# Patient Record
Sex: Female | Born: 1950 | Race: White | Hispanic: No | Marital: Married | Smoking: Current every day smoker
Health system: Southern US, Community
[De-identification: ages and names within clinical notes are randomized; demographics above are authoritative.]

## PROBLEM LIST (undated history)

## (undated) DIAGNOSIS — K219 Gastro-esophageal reflux disease without esophagitis: Secondary | ICD-10-CM

## (undated) DIAGNOSIS — I5042 Chronic combined systolic (congestive) and diastolic (congestive) heart failure: Secondary | ICD-10-CM

## (undated) DIAGNOSIS — Z8709 Personal history of other diseases of the respiratory system: Secondary | ICD-10-CM

## (undated) DIAGNOSIS — Z87442 Personal history of urinary calculi: Secondary | ICD-10-CM

## (undated) DIAGNOSIS — K449 Diaphragmatic hernia without obstruction or gangrene: Secondary | ICD-10-CM

## (undated) DIAGNOSIS — R0600 Dyspnea, unspecified: Secondary | ICD-10-CM

## (undated) DIAGNOSIS — I2089 Other forms of angina pectoris: Secondary | ICD-10-CM

## (undated) DIAGNOSIS — E039 Hypothyroidism, unspecified: Secondary | ICD-10-CM

## (undated) DIAGNOSIS — Z720 Tobacco use: Secondary | ICD-10-CM

## (undated) DIAGNOSIS — R21 Rash and other nonspecific skin eruption: Secondary | ICD-10-CM

## (undated) DIAGNOSIS — G252 Other specified forms of tremor: Secondary | ICD-10-CM

## (undated) DIAGNOSIS — J439 Emphysema, unspecified: Secondary | ICD-10-CM

## (undated) DIAGNOSIS — F32A Depression, unspecified: Secondary | ICD-10-CM

## (undated) DIAGNOSIS — R0609 Other forms of dyspnea: Secondary | ICD-10-CM

## (undated) DIAGNOSIS — Z7189 Other specified counseling: Secondary | ICD-10-CM

## (undated) DIAGNOSIS — I208 Other forms of angina pectoris: Secondary | ICD-10-CM

## (undated) DIAGNOSIS — C880 Waldenstrom macroglobulinemia: Secondary | ICD-10-CM

## (undated) DIAGNOSIS — I739 Peripheral vascular disease, unspecified: Secondary | ICD-10-CM

## (undated) DIAGNOSIS — I700 Atherosclerosis of aorta: Secondary | ICD-10-CM

## (undated) DIAGNOSIS — F329 Major depressive disorder, single episode, unspecified: Secondary | ICD-10-CM

## (undated) DIAGNOSIS — F419 Anxiety disorder, unspecified: Secondary | ICD-10-CM

## (undated) DIAGNOSIS — N179 Acute kidney failure, unspecified: Secondary | ICD-10-CM

## (undated) DIAGNOSIS — J90 Pleural effusion, not elsewhere classified: Secondary | ICD-10-CM

## (undated) DIAGNOSIS — J449 Chronic obstructive pulmonary disease, unspecified: Secondary | ICD-10-CM

## (undated) HISTORY — PX: DG THUMB RIGHT HAND (ARMC HX): HXRAD1825

## (undated) HISTORY — PX: BREAST SURGERY: SHX581

## (undated) HISTORY — PX: KIDNEY STONE SURGERY: SHX686

---
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
## 1998-05-02 ENCOUNTER — Other Ambulatory Visit: Admission: RE | Admit: 1998-05-02 | Discharge: 1998-05-02 | Payer: Self-pay | Admitting: Family Medicine

## 1999-03-18 ENCOUNTER — Encounter: Payer: Self-pay | Admitting: Emergency Medicine

## 1999-03-18 ENCOUNTER — Emergency Department (HOSPITAL_COMMUNITY): Admission: EM | Admit: 1999-03-18 | Discharge: 1999-03-18 | Payer: Self-pay | Admitting: Emergency Medicine

## 2002-06-14 ENCOUNTER — Ambulatory Visit (HOSPITAL_BASED_OUTPATIENT_CLINIC_OR_DEPARTMENT_OTHER): Admission: RE | Admit: 2002-06-14 | Discharge: 2002-06-14 | Payer: Self-pay | Admitting: Urology

## 2009-06-05 ENCOUNTER — Other Ambulatory Visit: Admission: RE | Admit: 2009-06-05 | Discharge: 2009-06-05 | Payer: Self-pay | Admitting: Radiology

## 2010-09-04 NOTE — Op Note (Signed)
NAMESHARLYNE, Tiffany Leon                             ACCOUNT NO.:  [id number]   MEDICAL RECORD NO.:  [id number]                   PATIENT TYPE:  AMB   LOCATION:  [facility]                                 FACILITY:  [facility]   PHYSICIAN:  Excell Seltzer. Annabell Howells, M.D.                 DATE OF BIRTH:  12-27-1950   DATE OF PROCEDURE:  06/14/2002  DATE OF DISCHARGE:                                 OPERATIVE REPORT   PROCEDURE:  Cystoscopy, left retrograde pyelogram, left ureteroscopic stone  extraction.   PREOPERATIVE DIAGNOSES:  Left UVJ stone.   POSTOPERATIVE DIAGNOSES:  Left UVJ stone.   SURGEON:  Excell Seltzer. Annabell Howells, M.D.   ANESTHESIA:  General.   COMPLICATIONS:  None.   INDICATIONS FOR PROCEDURE:  [name] is a 60 year old white female who  presented with left flank pain and was found to have a small left distal  ureteral stone. The stone has not passed and she has remained symptomatic  and has elected ureteroscopy.   DESCRIPTION OF PROCEDURE:  The patient was taken to the operating room and  after receiving p.o. antibiotics, a general anesthetic was induced. She was  placed in lithotomy position, her perineum and genitalia were prepped with  Betadine solution and she was draped in the usual sterile fashion.  Cystoscopy was performed using the 12 and 70 degree lenses through a 22  [place] scope. Examination revealed a normal urethra. The bladder wall was  smooth and pail without tumor, stones or inflammation. The ureteral orifices  were in the normal anatomic position. The left ureteral orifice was  cannulated with a 5 [place] open end catheter, contrast was instilled, there  was a filling defect in the distal ureter consistent with a stone. After the  present stone was confirmed, the open end catheter was removed and a  guidewire was the passed up the ureteral orifice to the kidney. The  cystoscope was removed and a 6 [place] short ureteroscope was passed over the  wire and negotiated up the ureter to  the skin. There was a bit of  inflammation and narrowing of the ureter at the level of the stone but this  was easily disrupted with the scope. The scope was then backed out and  replaced along side the wire. The stone was visualized once again and was  actually teased out with the scope, no basketing was required. Once the  stone was in the bladder, the cystoscope was reinserted and the stone was  evacuated. At this point, the bladder was drained and the guidewire was  removed and no stent was inserted as there was minimal trauma to the ureter.  At this point, the patient was taken down from lithotomy position, her  anesthetic was reversed and she was moved to the recovery room in stable  condition. There were no complications.  Excell Seltzer. Annabell Howells, M.D.    JJW/MEDQ  D:  06/14/2002  T:  06/14/2002  Job:  [id number]

## 2012-05-02 ENCOUNTER — Other Ambulatory Visit (HOSPITAL_COMMUNITY)
Admission: RE | Admit: 2012-05-02 | Discharge: 2012-05-02 | Disposition: A | Discharge status: Home / Self Care | Payer: BC Managed Care – PPO | Source: Ambulatory Visit | Attending: Family Medicine | Admitting: Family Medicine

## 2012-05-02 ENCOUNTER — Other Ambulatory Visit: Payer: Self-pay | Admitting: Family Medicine

## 2012-05-02 DIAGNOSIS — Z1151 Encounter for screening for human papillomavirus (HPV): Secondary | ICD-10-CM | POA: Insufficient documentation

## 2012-05-02 DIAGNOSIS — Z124 Encounter for screening for malignant neoplasm of cervix: Secondary | ICD-10-CM | POA: Insufficient documentation

## 2013-09-13 ENCOUNTER — Other Ambulatory Visit: Payer: Self-pay | Admitting: Internal Medicine

## 2013-09-13 DIAGNOSIS — R0602 Shortness of breath: Secondary | ICD-10-CM

## 2013-09-14 ENCOUNTER — Encounter (INDEPENDENT_AMBULATORY_CARE_PROVIDER_SITE_OTHER): Payer: Self-pay

## 2013-09-14 ENCOUNTER — Ambulatory Visit (INDEPENDENT_AMBULATORY_CARE_PROVIDER_SITE_OTHER): Payer: 59 | Admitting: Internal Medicine

## 2013-09-14 DIAGNOSIS — R0602 Shortness of breath: Secondary | ICD-10-CM

## 2013-09-14 NOTE — Progress Notes (Signed)
PFT done today. 

## 2013-09-16 LAB — PULMONARY FUNCTION TEST
DL/VA % pred: 74 %
DL/VA: 3.68 ml/min/mmHg/L
DLCO unc % pred: 72 %
DLCO unc: 18.5 ml/min/mmHg
FEF 25-75 Post: 1.44 L/sec
FEF 25-75 Pre: 1.26 L/sec
FEF2575-%Change-Post: 14 %
FEF2575-%Pred-Post: 65 %
FEF2575-%Pred-Pre: 57 %
FEV1-%Change-Post: 1 %
FEV1-%Pred-Post: 83 %
FEV1-%Pred-Pre: 82 %
FEV1-Post: 2.1 L
FEV1-Pre: 2.06 L
FEV1FVC-%Change-Post: -1 %
FEV1FVC-%Pred-Pre: 92 %
FEV6-%Change-Post: 1 %
FEV6-%Pred-Post: 91 %
FEV6-%Pred-Pre: 90 %
FEV6-Post: 2.88 L
FEV6-Pre: 2.84 L
FEV6FVC-%Change-Post: -1 %
FEV6FVC-%Pred-Post: 101 %
FEV6FVC-%Pred-Pre: 102 %
FVC-%Change-Post: 2 %
FVC-%Pred-Post: 90 %
FVC-%Pred-Pre: 87 %
FVC-Post: 2.96 L
FVC-Pre: 2.88 L
Post FEV1/FVC ratio: 71 %
Post FEV6/FVC ratio: 98 %
Pre FEV1/FVC ratio: 72 %
Pre FEV6/FVC Ratio: 99 %
RV % pred: 137 %
RV: 2.94 L
TLC % pred: 116 %
TLC: 6.05 L

## 2014-08-08 ENCOUNTER — Other Ambulatory Visit: Payer: Self-pay | Admitting: Family Medicine

## 2014-08-08 ENCOUNTER — Ambulatory Visit
Admission: RE | Admit: 2014-08-08 | Discharge: 2014-08-08 | Disposition: A | Discharge status: Home / Self Care | Payer: Self-pay | Source: Ambulatory Visit | Attending: Family Medicine | Admitting: Family Medicine

## 2014-08-08 DIAGNOSIS — W19XXXA Unspecified fall, initial encounter: Secondary | ICD-10-CM

## 2014-08-23 ENCOUNTER — Other Ambulatory Visit (HOSPITAL_COMMUNITY): Payer: Self-pay | Admitting: Gastroenterology

## 2017-11-29 ENCOUNTER — Other Ambulatory Visit: Payer: Self-pay | Admitting: Radiology

## 2017-12-06 ENCOUNTER — Ambulatory Visit: Payer: Self-pay | Admitting: General Surgery

## 2017-12-06 DIAGNOSIS — N6022 Fibroadenosis of left breast: Secondary | ICD-10-CM

## 2017-12-14 ENCOUNTER — Other Ambulatory Visit: Payer: Self-pay | Admitting: Gastroenterology

## 2017-12-14 DIAGNOSIS — R1310 Dysphagia, unspecified: Secondary | ICD-10-CM

## 2017-12-21 NOTE — Pre-Procedure Instructions (Signed)
Tiffany Leon  12/21/2017      [id number] [address] [place] [place] [address] [place] [address] Phone: [phone] Fax: [phone]    Your procedure is scheduled on September 11  Report to [place] at [place].M.  Call this number if you have problems the morning of surgery:  [phone]   Remember:  Do not eat or drink after midnight.     Take these medicines the morning of surgery with A SIP OF WATER  clonazePAM (KLONOPIN levothyroxine (SYNTHROID, LEVOTHROID)  7 days prior to surgery STOP taking any Aspirin(unless otherwise instructed by your surgeon), Aleve, Naproxen, Ibuprofen, Motrin, Advil, Goody's, BC's, all herbal medications, fish oil, and all vitamins    Do not wear jewelry, make-up or nail polish.  Do not wear lotions, powders, or perfumes, or deodorant.  Do not shave 48 hours prior to surgery.    Do not bring valuables to the hospital.  [facility] is not responsible for any belongings or valuables.  Contacts, dentures or bridgework may not be worn into surgery.  Leave your suitcase in the car.  After surgery it may be brought to your room.  For patients admitted to the hospital, discharge time will be determined by your treatment team.  Patients discharged the day of surgery will not be allowed to drive home.    Special instructions:   [facility]- Preparing For [facility]  Before surgery, you can play an important role. Because skin is not sterile, your skin needs to be as free of germs as possible. You can reduce the number of germs on your skin by washing with CHG (chlorahexidine gluconate) Soap before surgery.  CHG is an antiseptic cleaner which kills germs and bonds with the skin to continue killing germs even after washing.    Oral Hygiene is also important to reduce your risk of infection.  Remember - BRUSH YOUR TEETH THE MORNING OF SURGERY WITH YOUR REGULAR TOOTHPASTE  Please do not use if  you have an allergy to CHG or antibacterial soaps. If your skin becomes reddened/irritated stop using the CHG.  Do not shave (including legs and underarms) for at least 48 hours prior to first CHG shower. It is OK to shave your face.  Please follow these instructions carefully.   1. Shower the NIGHT BEFORE SURGERY and the MORNING OF SURGERY with CHG.   2. If you chose to wash your hair, wash your hair first as usual with your normal shampoo.  3. After you shampoo, rinse your hair and body thoroughly to remove the shampoo.  4. Use CHG as you would any other liquid soap. You can apply CHG directly to the skin and wash gently with a scrungie or a clean washcloth.   5. Apply the CHG Soap to your body ONLY FROM THE NECK DOWN.  Do not use on open wounds or open sores. Avoid contact with your eyes, ears, mouth and genitals (private parts). Wash Face and genitals (private parts)  with your normal soap.  6. Wash thoroughly, paying special attention to the area where your surgery will be performed.  7. Thoroughly rinse your body with warm water from the neck down.  8. DO NOT shower/wash with your normal soap after using and rinsing off the CHG Soap.  9. Pat yourself dry with a CLEAN TOWEL.  10. Wear CLEAN PAJAMAS to bed the night before surgery, wear comfortable clothes the morning of surgery  11. Place CLEAN SHEETS on your bed the night of your first shower and DO NOT SLEEP WITH PETS.    Day of Surgery:  Do not apply any deodorants/lotions.  Please wear clean clothes to the hospital/surgery center.   Remember to brush your teeth WITH YOUR REGULAR TOOTHPASTE.    Please read over the following fact sheets that you were given.

## 2017-12-22 ENCOUNTER — Encounter (HOSPITAL_COMMUNITY): Payer: Self-pay | Admitting: *Deleted

## 2017-12-22 ENCOUNTER — Other Ambulatory Visit: Payer: Self-pay

## 2017-12-22 ENCOUNTER — Encounter (HOSPITAL_COMMUNITY)
Admission: RE | Admit: 2017-12-22 | Discharge: 2017-12-22 | Disposition: A | Discharge status: Home / Self Care | Payer: Medicare Other | Source: Ambulatory Visit | Attending: General Surgery | Admitting: General Surgery

## 2017-12-22 DIAGNOSIS — Z01812 Encounter for preprocedural laboratory examination: Secondary | ICD-10-CM | POA: Insufficient documentation

## 2017-12-22 HISTORY — DX: Personal history of urinary calculi: Z87.442

## 2017-12-22 HISTORY — DX: Emphysema, unspecified: J43.9

## 2017-12-22 HISTORY — DX: Gastro-esophageal reflux disease without esophagitis: K21.9

## 2017-12-22 HISTORY — DX: Anxiety disorder, unspecified: F41.9

## 2017-12-22 HISTORY — DX: Hypothyroidism, unspecified: E03.9

## 2017-12-22 HISTORY — DX: Personal history of other diseases of the respiratory system: Z87.09

## 2017-12-22 HISTORY — DX: Major depressive disorder, single episode, unspecified: F32.9

## 2017-12-22 HISTORY — DX: Depression, unspecified: F32.A

## 2017-12-22 LAB — BASIC METABOLIC PANEL
Anion gap: 9 (ref 5–15)
BUN: 14 mg/dL (ref 8–23)
CHLORIDE: 108 mmol/L (ref 98–111)
CO2: 25 mmol/L (ref 22–32)
CREATININE: 0.72 mg/dL (ref 0.44–1.00)
Calcium: 9.3 mg/dL (ref 8.9–10.3)
GFR calc non Af Amer: >60 mL/min (ref >60)
Glucose, Bld: 109 mg/dL — ABNORMAL HIGH (ref 70–99)
Potassium: 4.1 mmol/L (ref 3.5–5.1)
SODIUM: 142 mmol/L (ref 135–145)

## 2017-12-22 LAB — CBC
HEMATOCRIT: 44.2 % (ref 36.0–46.0)
HEMOGLOBIN: 14.6 g/dL (ref 12.0–15.0)
MCH: 34 pg (ref 26.0–34.0)
MCHC: 33 g/dL (ref 30.0–36.0)
MCV: 102.8 fL — ABNORMAL HIGH (ref 78.0–100.0)
Platelets: 158 10*3/uL (ref 150–400)
RBC: 4.3 MIL/uL (ref 3.87–5.11)
RDW: 13.2 % (ref 11.5–15.5)
WBC: 4.1 10*3/uL (ref 4.0–10.5)

## 2017-12-27 NOTE — Anesthesia Preprocedure Evaluation (Addendum)
Anesthesia Evaluation  Patient identified by MRN, date of birth, ID band Patient awake    Reviewed: Allergy & Precautions, NPO status , Patient's Chart, lab work & pertinent test results  Airway Mallampati: I       Dental no notable dental hx. (+) Teeth Intact   Pulmonary Current Smoker,    Pulmonary exam normal breath sounds clear to auscultation       Cardiovascular negative cardio ROS Normal cardiovascular exam Rhythm:Regular Rate:Normal     Neuro/Psych PSYCHIATRIC DISORDERS Anxiety Depression    GI/Hepatic GERD  ,  Endo/Other  Hypothyroidism   Renal/GU      Musculoskeletal   Abdominal Normal abdominal exam  (+)   Peds  Hematology   Anesthesia Other Findings   Reproductive/Obstetrics                            Anesthesia Physical Anesthesia Plan  ASA: II  Anesthesia Plan: General   Post-op Pain Management:    Induction: Intravenous  PONV Risk Score and Plan: 3 and Ondansetron and Dexamethasone  Airway Management Planned: LMA  Additional Equipment:   Intra-op Plan:   Post-operative Plan: Extubation in OR  Informed Consent: I have reviewed the patients History and Physical, chart, labs and discussed the procedure including the risks, benefits and alternatives for the proposed anesthesia with the patient or authorized representative who has indicated his/her understanding and acceptance.   Dental advisory given  Plan Discussed with: CRNA and Surgeon  Anesthesia Plan Comments:        Anesthesia Quick Evaluation

## 2017-12-28 ENCOUNTER — Ambulatory Visit (HOSPITAL_COMMUNITY)
Admission: RE | Admit: 2017-12-28 | Discharge: 2017-12-28 | Disposition: A | Discharge status: Home / Self Care | Payer: Medicare Other | Source: Ambulatory Visit | Attending: General Surgery | Admitting: General Surgery

## 2017-12-28 ENCOUNTER — Ambulatory Visit (HOSPITAL_COMMUNITY): Payer: Medicare Other | Admitting: Anesthesiology

## 2017-12-28 ENCOUNTER — Encounter (HOSPITAL_COMMUNITY): Payer: Self-pay | Admitting: Anesthesiology

## 2017-12-28 ENCOUNTER — Encounter (HOSPITAL_COMMUNITY)
Admission: RE | Disposition: A | Discharge status: Home / Self Care | Payer: Self-pay | Source: Ambulatory Visit | Attending: General Surgery

## 2017-12-28 DIAGNOSIS — F17200 Nicotine dependence, unspecified, uncomplicated: Secondary | ICD-10-CM | POA: Insufficient documentation

## 2017-12-28 DIAGNOSIS — J439 Emphysema, unspecified: Secondary | ICD-10-CM | POA: Insufficient documentation

## 2017-12-28 DIAGNOSIS — F419 Anxiety disorder, unspecified: Secondary | ICD-10-CM | POA: Insufficient documentation

## 2017-12-28 DIAGNOSIS — N6022 Fibroadenosis of left breast: Secondary | ICD-10-CM | POA: Insufficient documentation

## 2017-12-28 DIAGNOSIS — E039 Hypothyroidism, unspecified: Secondary | ICD-10-CM | POA: Insufficient documentation

## 2017-12-28 DIAGNOSIS — Z885 Allergy status to narcotic agent status: Secondary | ICD-10-CM | POA: Insufficient documentation

## 2017-12-28 DIAGNOSIS — Z79899 Other long term (current) drug therapy: Secondary | ICD-10-CM | POA: Diagnosis not present

## 2017-12-28 DIAGNOSIS — K219 Gastro-esophageal reflux disease without esophagitis: Secondary | ICD-10-CM | POA: Diagnosis not present

## 2017-12-28 DIAGNOSIS — N6012 Diffuse cystic mastopathy of left breast: Secondary | ICD-10-CM | POA: Insufficient documentation

## 2017-12-28 DIAGNOSIS — F329 Major depressive disorder, single episode, unspecified: Secondary | ICD-10-CM | POA: Insufficient documentation

## 2017-12-28 DIAGNOSIS — Z886 Allergy status to analgesic agent status: Secondary | ICD-10-CM | POA: Insufficient documentation

## 2017-12-28 DIAGNOSIS — Z803 Family history of malignant neoplasm of breast: Secondary | ICD-10-CM | POA: Diagnosis not present

## 2017-12-28 HISTORY — PX: BREAST LUMPECTOMY WITH RADIOACTIVE SEED LOCALIZATION: SHX6424

## 2017-12-28 SURGERY — BREAST LUMPECTOMY WITH RADIOACTIVE SEED LOCALIZATION
Anesthesia: General | Site: Breast | Laterality: Left

## 2017-12-28 MED ORDER — FENTANYL CITRATE (PF) 250 MCG/5ML IJ SOLN
INTRAMUSCULAR | Status: AC
  Filled 2017-12-28: qty 5

## 2017-12-28 MED ORDER — SUCCINYLCHOLINE CHLORIDE 200 MG/10ML IV SOSY
PREFILLED_SYRINGE | INTRAVENOUS | Status: AC
  Filled 2017-12-28: qty 10

## 2017-12-28 MED ORDER — LIDOCAINE 2% (20 MG/ML) 5 ML SYRINGE
INTRAMUSCULAR | Status: DC | PRN
  Administered 2017-12-28: 100 mg via INTRAVENOUS

## 2017-12-28 MED ORDER — BUPIVACAINE-EPINEPHRINE 0.25% -1:200000 IJ SOLN
INTRAMUSCULAR | Status: DC | PRN
  Administered 2017-12-28: 10 mL

## 2017-12-28 MED ORDER — ACETAMINOPHEN 500 MG PO TABS
ORAL_TABLET | ORAL | Status: AC
  Administered 2017-12-28: 1000 mg via ORAL
  Filled 2017-12-28: qty 2

## 2017-12-28 MED ORDER — CELECOXIB 200 MG PO CAPS
ORAL_CAPSULE | ORAL | Status: AC
  Administered 2017-12-28: 200 mg via ORAL
  Filled 2017-12-28: qty 1

## 2017-12-28 MED ORDER — ACETAMINOPHEN 325 MG PO TABS: 325.0000 mg | ORAL_TABLET | ORAL | Status: DC | PRN

## 2017-12-28 MED ORDER — CELECOXIB 200 MG PO CAPS
200.0000 mg | ORAL_CAPSULE | ORAL | Status: AC
  Administered 2017-12-28: 200 mg via ORAL

## 2017-12-28 MED ORDER — CHLORHEXIDINE GLUCONATE CLOTH 2 % EX PADS: 6.0000 | MEDICATED_PAD | Freq: Once | CUTANEOUS | Status: DC

## 2017-12-28 MED ORDER — OXYCODONE HCL 5 MG PO TABS: 5.0000 mg | ORAL_TABLET | Freq: Once | ORAL | Status: DC | PRN

## 2017-12-28 MED ORDER — FENTANYL CITRATE (PF) 100 MCG/2ML IJ SOLN: 25.0000 ug | INTRAMUSCULAR | Status: DC | PRN

## 2017-12-28 MED ORDER — ONDANSETRON HCL 4 MG/2ML IJ SOLN
INTRAMUSCULAR | Status: DC | PRN
  Administered 2017-12-28: 4 mg via INTRAVENOUS

## 2017-12-28 MED ORDER — MEPERIDINE HCL 50 MG/ML IJ SOLN: 6.2500 mg | INTRAMUSCULAR | Status: DC | PRN

## 2017-12-28 MED ORDER — EPHEDRINE SULFATE-NACL 50-0.9 MG/10ML-% IV SOSY
PREFILLED_SYRINGE | INTRAVENOUS | Status: DC | PRN
  Administered 2017-12-28: 10 mg via INTRAVENOUS

## 2017-12-28 MED ORDER — LIDOCAINE 2% (20 MG/ML) 5 ML SYRINGE
INTRAMUSCULAR | Status: AC
  Filled 2017-12-28: qty 5

## 2017-12-28 MED ORDER — DEXAMETHASONE SODIUM PHOSPHATE 10 MG/ML IJ SOLN
INTRAMUSCULAR | Status: DC | PRN
  Administered 2017-12-28: 10 mg via INTRAVENOUS

## 2017-12-28 MED ORDER — TRAMADOL HCL 50 MG PO TABS: 50.0000 mg | ORAL_TABLET | Freq: Four times a day (QID) | ORAL | 1 refills | Status: DC | PRN

## 2017-12-28 MED ORDER — ACETAMINOPHEN 500 MG PO TABS
1000.0000 mg | ORAL_TABLET | ORAL | Status: AC
  Administered 2017-12-28: 1000 mg via ORAL

## 2017-12-28 MED ORDER — ONDANSETRON HCL 4 MG/2ML IJ SOLN
INTRAMUSCULAR | Status: AC
  Filled 2017-12-28: qty 2

## 2017-12-28 MED ORDER — FENTANYL CITRATE (PF) 250 MCG/5ML IJ SOLN
INTRAMUSCULAR | Status: DC | PRN
  Administered 2017-12-28: 25 ug via INTRAVENOUS
  Administered 2017-12-28: 50 ug via INTRAVENOUS

## 2017-12-28 MED ORDER — GABAPENTIN 300 MG PO CAPS
300.0000 mg | ORAL_CAPSULE | ORAL | Status: AC
  Administered 2017-12-28: 300 mg via ORAL

## 2017-12-28 MED ORDER — CEFAZOLIN SODIUM-DEXTROSE 2-4 GM/100ML-% IV SOLN
2.0000 g | INTRAVENOUS | Status: AC
  Administered 2017-12-28: 2 g via INTRAVENOUS

## 2017-12-28 MED ORDER — 0.9 % SODIUM CHLORIDE (POUR BTL) OPTIME
TOPICAL | Status: DC | PRN
  Administered 2017-12-28: 1000 mL

## 2017-12-28 MED ORDER — GABAPENTIN 300 MG PO CAPS
ORAL_CAPSULE | ORAL | Status: AC
  Administered 2017-12-28: 300 mg via ORAL
  Filled 2017-12-28: qty 1

## 2017-12-28 MED ORDER — PHENYLEPHRINE 40 MCG/ML (10ML) SYRINGE FOR IV PUSH (FOR BLOOD PRESSURE SUPPORT)
PREFILLED_SYRINGE | INTRAVENOUS | Status: AC
  Filled 2017-12-28: qty 10

## 2017-12-28 MED ORDER — ROCURONIUM BROMIDE 50 MG/5ML IV SOSY
PREFILLED_SYRINGE | INTRAVENOUS | Status: AC
  Filled 2017-12-28: qty 5

## 2017-12-28 MED ORDER — KETOROLAC TROMETHAMINE 30 MG/ML IJ SOLN: 30.0000 mg | Freq: Once | INTRAMUSCULAR | Status: DC | PRN

## 2017-12-28 MED ORDER — DEXAMETHASONE SODIUM PHOSPHATE 10 MG/ML IJ SOLN
INTRAMUSCULAR | Status: AC
  Filled 2017-12-28: qty 1

## 2017-12-28 MED ORDER — MIDAZOLAM HCL 2 MG/2ML IJ SOLN
INTRAMUSCULAR | Status: DC | PRN
  Administered 2017-12-28: 2 mg via INTRAVENOUS

## 2017-12-28 MED ORDER — BUPIVACAINE-EPINEPHRINE (PF) 0.25% -1:200000 IJ SOLN
INTRAMUSCULAR | Status: AC
  Filled 2017-12-28: qty 30

## 2017-12-28 MED ORDER — ACETAMINOPHEN 160 MG/5ML PO SOLN: 325.0000 mg | ORAL | Status: DC | PRN

## 2017-12-28 MED ORDER — EPHEDRINE 5 MG/ML INJ
INTRAVENOUS | Status: AC
  Filled 2017-12-28: qty 10

## 2017-12-28 MED ORDER — MIDAZOLAM HCL 2 MG/2ML IJ SOLN
INTRAMUSCULAR | Status: AC
  Filled 2017-12-28: qty 2

## 2017-12-28 MED ORDER — LACTATED RINGERS IV SOLN
INTRAVENOUS | Status: DC | PRN
  Administered 2017-12-28: 08:00:00 via INTRAVENOUS

## 2017-12-28 MED ORDER — OXYCODONE HCL 5 MG/5ML PO SOLN: 5.0000 mg | Freq: Once | ORAL | Status: DC | PRN

## 2017-12-28 MED ORDER — PHENYLEPHRINE 40 MCG/ML (10ML) SYRINGE FOR IV PUSH (FOR BLOOD PRESSURE SUPPORT)
PREFILLED_SYRINGE | INTRAVENOUS | Status: DC | PRN
  Administered 2017-12-28: 80 ug via INTRAVENOUS

## 2017-12-28 MED ORDER — PROPOFOL 10 MG/ML IV BOLUS
INTRAVENOUS | Status: AC
  Filled 2017-12-28: qty 20

## 2017-12-28 MED ORDER — PROPOFOL 10 MG/ML IV BOLUS
INTRAVENOUS | Status: DC | PRN
  Administered 2017-12-28: 50 mg via INTRAVENOUS
  Administered 2017-12-28: 100 mg via INTRAVENOUS

## 2017-12-28 MED ORDER — PROMETHAZINE HCL 25 MG/ML IJ SOLN: 6.2500 mg | INTRAMUSCULAR | Status: DC | PRN

## 2017-12-28 MED ORDER — CEFAZOLIN SODIUM-DEXTROSE 2-4 GM/100ML-% IV SOLN
INTRAVENOUS | Status: AC
  Filled 2017-12-28: qty 100

## 2017-12-28 SURGICAL SUPPLY — 44 items
ADH SKN CLS APL DERMABOND .7 (GAUZE/BANDAGES/DRESSINGS) ×1
APPLIER CLIP 9.375 MED OPEN (MISCELLANEOUS)
APR CLP MED 9.3 20 MLT OPN (MISCELLANEOUS)
BINDER BREAST LRG (GAUZE/BANDAGES/DRESSINGS) ×1 IMPLANT
BINDER BREAST XLRG (GAUZE/BANDAGES/DRESSINGS) IMPLANT
BLADE SURG 15 STRL LF DISP TIS (BLADE) ×1 IMPLANT
BLADE SURG 15 STRL SS (BLADE) ×2
CANISTER SUCT 3000ML PPV (MISCELLANEOUS) ×2 IMPLANT
CHLORAPREP W/TINT 26ML (MISCELLANEOUS) ×2 IMPLANT
CLIP APPLIE 9.375 MED OPEN (MISCELLANEOUS) IMPLANT
COVER PROBE W GEL 5X96 (DRAPES) ×2 IMPLANT
COVER SURGICAL LIGHT HANDLE (MISCELLANEOUS) ×2 IMPLANT
DERMABOND ADVANCED (GAUZE/BANDAGES/DRESSINGS) ×1
DERMABOND ADVANCED .7 DNX12 (GAUZE/BANDAGES/DRESSINGS) ×1 IMPLANT
DEVICE DUBIN SPECIMEN MAMMOGRA (MISCELLANEOUS) ×2 IMPLANT
DRAPE CHEST BREAST 15X10 FENES (DRAPES) ×2 IMPLANT
DRAPE UTILITY XL STRL (DRAPES) ×2 IMPLANT
ELECT COATED BLADE 2.86 ST (ELECTRODE) ×2 IMPLANT
ELECT REM PT RETURN 9FT ADLT (ELECTROSURGICAL) ×2
ELECTRODE REM PT RTRN 9FT ADLT (ELECTROSURGICAL) ×1 IMPLANT
GLOVE BIO SURGEON STRL SZ7.5 (GLOVE) ×4 IMPLANT
GLOVE BIOGEL PI IND STRL 8 (GLOVE) IMPLANT
GLOVE BIOGEL PI INDICATOR 8 (GLOVE) ×1
GLOVE SURG SS PI 6.5 STRL IVOR (GLOVE) ×1 IMPLANT
GOWN STRL REUS W/ TWL LRG LVL3 (GOWN DISPOSABLE) ×2 IMPLANT
GOWN STRL REUS W/TWL LRG LVL3 (GOWN DISPOSABLE) ×4
KIT BASIN OR (CUSTOM PROCEDURE TRAY) ×2 IMPLANT
KIT MARKER MARGIN INK (KITS) ×2 IMPLANT
LIGHT WAVEGUIDE WIDE FLAT (MISCELLANEOUS) IMPLANT
NDL HYPO 25GX1X1/2 BEV (NEEDLE) ×1 IMPLANT
NEEDLE HYPO 25GX1X1/2 BEV (NEEDLE) ×2 IMPLANT
NS IRRIG 1000ML POUR BTL (IV SOLUTION) ×2 IMPLANT
PACK SURGICAL SETUP 50X90 (CUSTOM PROCEDURE TRAY) ×2 IMPLANT
PENCIL BUTTON HOLSTER BLD 10FT (ELECTRODE) ×2 IMPLANT
SPONGE LAP 18X18 X RAY DECT (DISPOSABLE) ×2 IMPLANT
SUT MNCRL AB 4-0 PS2 18 (SUTURE) ×2 IMPLANT
SUT SILK 2 0 SH (SUTURE) IMPLANT
SUT VIC AB 3-0 SH 18 (SUTURE) ×2 IMPLANT
SYR BULB 3OZ (MISCELLANEOUS) ×2 IMPLANT
SYR CONTROL 10ML LL (SYRINGE) ×2 IMPLANT
TOWEL OR 17X24 6PK STRL BLUE (TOWEL DISPOSABLE) ×1 IMPLANT
TOWEL OR 17X26 10 PK STRL BLUE (TOWEL DISPOSABLE) ×2 IMPLANT
TUBE CONNECTING 12X1/4 (SUCTIONS) ×2 IMPLANT
YANKAUER SUCT BULB TIP NO VENT (SUCTIONS) ×2 IMPLANT

## 2017-12-28 NOTE — Interval H&P Note (Signed)
History and Physical Interval Note:  12/28/2017 7:31 AM  Tiffany Leon  has presented today for surgery, with the diagnosis of LEFT BREAST COMPLEX SCLEROSING LESION  The various methods of treatment have been discussed with the patient and family. After consideration of risks, benefits and other options for treatment, the patient has consented to  Procedure(s): BREAST LUMPECTOMY WITH RADIOACTIVE SEED LOCALIZATION (Left) as a surgical intervention .  The patient's history has been reviewed, patient examined, no change in status, stable for surgery.  I have reviewed the patient's chart and labs.  Questions were answered to the patient's satisfaction.     TOTH III,Sindy Mccune S

## 2017-12-28 NOTE — Op Note (Signed)
12/28/2017  9:26 AM  PATIENT:  Tiffany Leon  67 y.o. female  PRE-OPERATIVE DIAGNOSIS:  LEFT BREAST COMPLEX SCLEROSING LESION  POST-OPERATIVE DIAGNOSIS:  LEFT BREAST COMPLEX SCLEROSING LESION  PROCEDURE:  Procedure(s): BREAST LUMPECTOMY WITH RADIOACTIVE SEED LOCALIZATION (Left)  SURGEON:  Surgeon(s) and Role:    * Jovita Kussmaul, MD - Primary  PHYSICIAN ASSISTANT:   ASSISTANTS: none   ANESTHESIA:   local and general  EBL:  10 mL   BLOOD ADMINISTERED:none  DRAINS: none   LOCAL MEDICATIONS USED:  MARCAINE     SPECIMEN:  Source of Specimen:  left breast tissue  DISPOSITION OF SPECIMEN:  PATHOLOGY  COUNTS:  YES  TOURNIQUET:  * No tourniquets in log *  DICTATION: .Dragon Dictation   After informed consent was obtained the patient was brought to the operating room and placed in the supine position on the operating table.  After adequate induction of general anesthesia the patient's left breast was prepped with ChloraPrep, allowed to dry, and draped in usual sterile manner.  An appropriate timeout was performed.  Previously an I-125 seed was placed in the outer aspect of the left breast to mark an area of a complex sclerosing lesion.  The neoprobe was set to I-125 in the area of radioactivity was readily identified.  The area around this was infiltrated with quarter percent Marcaine.  A curvilinear incision was made along the outer edge of the breast with a 15 blade knife.  The incision was carried through the skin and subcutaneous tissue sharply with electrocautery.  Dissection was then carried towards the radioactive seed sharply with the electrocautery under the direction of the neoprobe.  Once I more closely approximated the position of the seed and clip I then removed a circular portion of breast tissue sharply with the electrocautery around the radioactive seed while checking the area of radioactivity frequently.  Once the specimen was removed it was oriented with the appropriate  paint colors.  A specimen radiograph was obtained that showed the clip and seed to be near the center of the specimen.  The specimen was then sent to pathology for further evaluation.  Hemostasis was achieved using the Bovie electrocautery.  The deep layer of the wound was then closed with layers of interrupted 3-0 Vicryl stitches.  The skin was then closed with interrupted 4-0 Monocryl subcuticular stitches.  Dermabond dressings were applied.  The patient tolerated the procedure well.  At the end of the case all needle sponge and instrument counts were correct.  The patient was then awakened and taken to recovery in stable condition.  PLAN OF CARE: Discharge to home after PACU  PATIENT DISPOSITION:  PACU - hemodynamically stable.   Delay start of Pharmacological VTE agent (>24hrs) due to surgical blood loss or risk of bleeding: not applicable

## 2017-12-28 NOTE — Transfer of Care (Signed)
Immediate Anesthesia Transfer of Care Note  Patient: Tiffany Leon  Procedure(s) Performed: BREAST LUMPECTOMY WITH RADIOACTIVE SEED LOCALIZATION (Left Breast)  Patient Location: PACU  Anesthesia Type:General  Level of Consciousness: awake, alert  and oriented  Airway & Oxygen Therapy: Patient Spontanous Breathing  Post-op Assessment: Report given to RN and Post -op Vital signs reviewed and stable  Post vital signs: Reviewed and stable  Last Vitals:  Vitals Value Taken Time  BP 133/65 12/28/2017  9:40 AM  Temp 36.2 C 12/28/2017  9:40 AM  Pulse 85 12/28/2017  9:44 AM  Resp 16 12/28/2017  9:44 AM  SpO2 92 % 12/28/2017  9:44 AM  Vitals shown include unvalidated device data.  Last Pain:  Vitals:   12/28/17 0940  TempSrc:   PainSc: 0-No pain         Complications: No apparent anesthesia complications

## 2017-12-28 NOTE — Anesthesia Procedure Notes (Signed)
Procedure Name: LMA Insertion Date/Time: 12/28/2017 8:41 AM Performed by: Marsa Aris, CRNA Pre-anesthesia Checklist: Patient identified, Emergency Drugs available, Suction available and Patient being monitored Patient Re-evaluated:Patient Re-evaluated prior to induction Oxygen Delivery Method: Circle System Utilized Preoxygenation: Pre-oxygenation with 100% oxygen Induction Type: IV induction Ventilation: Mask ventilation without difficulty LMA: LMA inserted LMA Size: 4.0 Number of attempts: 1 Airway Equipment and Method: Bite block Placement Confirmation: positive ETCO2 and breath sounds checked- equal and bilateral Tube secured with: Tape Dental Injury: Teeth and Oropharynx as per pre-operative assessment

## 2017-12-28 NOTE — H&P (Signed)
[name]  Location: [name] #: [id number] DOB: Apr 11, 1951 Married / Language: English / Race: White Female   History of Present Illness  The patient is a 67 year old female who presents with a breast mass. We're asked to see the patient in consultation by Dr. Elige Radon to evaluate her for a complex sclerosing lesion of the left breast. The patient is a 67 year old white female who recently went for a routine screening mammogram. At that time she was found to have a 3 mm cluster of calcification in the upper outer left breast that was abnormal. This was biopsied and came back as a complex sclerosing lesion. She had a previous fibroadenoma biopsied just anterior to this in 2011. Any breast pain or discharge from the nipple. Her only family history of breast cancer is in a grandmother and a first cousin. She is otherwise in good health.   Past Surgical History Breast Biopsy  Left. Oral Surgery  Tonsillectomy   Diagnostic Studies History  Colonoscopy  1-5 years ago Mammogram  1-3 years ago Pap Smear  1-5 years ago  Allergies  Codeine Sulfate *ANALGESICS - OPIOID*   Medication History  ClonazePAM (0.5MG  Tablet, Oral) Active. Levothyroxine Sodium (50MCG Tablet, Oral) Active. PARoxetine HCl (40MG  Tablet, Oral) Active. TraZODone HCl (50MG  Tablet, Oral) Active. Medications Reconciled  Social History  Alcohol use  Moderate alcohol use. Caffeine use  Carbonated beverages, Coffee, Tea. Illicit drug use  Remotely quit drug use. Tobacco use  Current every day smoker.  Family History  Cancer  Father. Cerebrovascular Accident  Mother. Colon Polyps  Mother, Sister. Depression  Brother. Ischemic Bowel Disease  Mother, Sister. Migraine Headache  Sister. Respiratory Condition  Father.  Pregnancy / Birth History Age at menarche  47 years. Age of menopause  <43 Gravida  0 Para  0  Other Problems  Anxiety Disorder  Back  Pain  Depression  Emphysema Of Lung  Kidney Stone  Lump In Breast  Thyroid Disease     Review of Systems  General Not Present- Appetite Loss, Chills, Fatigue, Fever, Night Sweats, Weight Gain and Weight Loss. Skin Not Present- Change in Wart/Mole, Dryness, Hives, Jaundice, New Lesions, Non-Healing Wounds, Rash and Ulcer. HEENT Present- Wears glasses/contact lenses. Not Present- Earache, Hearing Loss, Hoarseness, Nose Bleed, Oral Ulcers, Ringing in the Ears, Seasonal Allergies, Sinus Pain, Sore Throat, Visual Disturbances and Yellow Eyes. Respiratory Present- Wheezing. Not Present- Bloody sputum, Chronic Cough, Difficulty Breathing and Snoring. Breast Present- Breast Mass. Not Present- Breast Pain, Nipple Discharge and Skin Changes. Cardiovascular Present- Leg Cramps. Not Present- Chest Pain, Difficulty Breathing Lying Down, Palpitations, Rapid Heart Rate, Shortness of Breath and Swelling of Extremities. Gastrointestinal Present- Bloating, Difficulty Swallowing and Nausea. Not Present- Abdominal Pain, Bloody Stool, Change in Bowel Habits, Chronic diarrhea, Constipation, Excessive gas, Gets full quickly at meals, Hemorrhoids, Indigestion, Rectal Pain and Vomiting. Female Genitourinary Not Present- Frequency, Nocturia, Painful Urination, Pelvic Pain and Urgency. Musculoskeletal Present- Joint Pain. Not Present- Back Pain, Joint Stiffness, Muscle Pain, Muscle Weakness and Swelling of Extremities. Neurological Not Present- Decreased Memory, Fainting, Headaches, Numbness, Seizures, Tingling, Tremor, Trouble walking and Weakness. Psychiatric Present- Anxiety and Depression. Not Present- Bipolar, Change in Sleep Pattern, Fearful and Frequent crying. Endocrine Not Present- Cold Intolerance, Excessive Hunger, Hair Changes, Heat Intolerance, Hot flashes and New Diabetes. Hematology Not Present- Blood Thinners, Easy Bruising, Excessive bleeding, Gland problems, HIV and Persistent  Infections.  Vitals  Weight: 143 lb Height: 65.5in Body Surface Area: 1.72 m Body  Mass Index: 23.43 kg/m  Pulse: 80 (Regular)  BP: 124/82 (Sitting, Left Arm, Standard)       Physical Exam  General Mental Status-Alert. General Appearance-Consistent with stated age. Hydration-Well hydrated. Voice-Normal.  Head and Neck Head-normocephalic, atraumatic with no lesions or palpable masses. Trachea-midline. Thyroid Gland Characteristics - normal size and consistency.  Eye Eyeball - Bilateral-Extraocular movements intact. Sclera/Conjunctiva - Bilateral-No scleral icterus.  Chest and Lung Exam Chest and lung exam reveals -quiet, even and easy respiratory effort with no use of accessory muscles and on auscultation, normal breath sounds, no adventitious sounds and normal vocal resonance. Inspection Chest Wall - Normal. Back - normal.  Breast Note: There is no palpable mass in either breast. There is no palpable axillary, supraclavicular, or cervical lymphadenopathy.   Cardiovascular Cardiovascular examination reveals -normal heart sounds, regular rate and rhythm with no murmurs and normal pedal pulses bilaterally.  Abdomen Inspection Inspection of the abdomen reveals - No Hernias. Skin - Scar - no surgical scars. Palpation/Percussion Palpation and Percussion of the abdomen reveal - Soft, Non Tender, No Rebound tenderness, No Rigidity (guarding) and No hepatosplenomegaly. Auscultation Auscultation of the abdomen reveals - Bowel sounds normal.  Neurologic Neurologic evaluation reveals -alert and oriented x 3 with no impairment of recent or remote memory. Mental Status-Normal.  Musculoskeletal Normal Exam - Left-Upper Extremity Strength Normal and Lower Extremity Strength Normal. Normal Exam - Right-Upper Extremity Strength Normal and Lower Extremity Strength Normal.  Lymphatic Head & Neck  General Head & Neck Lymphatics: Bilateral  - Description - Normal. Axillary  General Axillary Region: Bilateral - Description - Normal. Tenderness - Non Tender. Femoral & Inguinal  Generalized Femoral & Inguinal Lymphatics: Bilateral - Description - Normal. Tenderness - Non Tender.    Assessment & Plan SCLEROSING ADENOSIS OF BREAST, LEFT (N60.22) Impression: The patient appears to have a small 3 mm area of complex sclerosing lesion in the upper outer left breast. The main reason for excision of this area would be because of a small risk of missing a cancer which is probably on the order of 5%. Given the small size of the area on the likelihood that it was removed it would also be reasonable to observe the area. I have given her these 2 options and she favors surgical excision. I have discussed with her in detail the risks and benefits of the operation as well as some of the technical aspects and she understands and wishes to proceed. I will plan for a left breast radioactive seed localized lumpectomy Current Plans Pt Education - Breast Diseases: discussed with patient and provided information.

## 2017-12-28 NOTE — Anesthesia Postprocedure Evaluation (Signed)
Anesthesia Post Note  Patient: Tiffany Leon  Procedure(s) Performed: BREAST LUMPECTOMY WITH RADIOACTIVE SEED LOCALIZATION (Left Breast)     Patient location during evaluation: PACU Anesthesia Type: General Level of consciousness: awake Pain management: pain level controlled Vital Signs Assessment: post-procedure vital signs reviewed and stable Respiratory status: spontaneous breathing Cardiovascular status: stable Postop Assessment: no apparent nausea or vomiting Anesthetic complications: no    Last Vitals:  Vitals:   12/28/17 0955 12/28/17 1010  BP: 124/64 (!) 138/50  Pulse: 73 70  Resp: 15 13  Temp:  (!) 36.3 C  SpO2: 94% 94%    Last Pain:  Vitals:   12/28/17 1017  TempSrc:   PainSc: 0-No pain   Pain Goal:                 Arzell Mcgeehan JR,JOHN Jamarkis Branam

## 2017-12-28 NOTE — Discharge Instructions (Signed)

## 2017-12-29 ENCOUNTER — Encounter (HOSPITAL_COMMUNITY): Payer: Self-pay | Admitting: General Surgery

## 2018-01-12 ENCOUNTER — Ambulatory Visit
Admission: RE | Admit: 2018-01-12 | Discharge: 2018-01-12 | Disposition: A | Discharge status: Home / Self Care | Payer: 59 | Source: Ambulatory Visit | Attending: Gastroenterology | Admitting: Gastroenterology

## 2018-01-12 DIAGNOSIS — R1310 Dysphagia, unspecified: Secondary | ICD-10-CM

## 2018-03-03 ENCOUNTER — Other Ambulatory Visit: Payer: Self-pay

## 2018-03-03 MED ORDER — PAROXETINE HCL 40 MG PO TABS: 60.0000 mg | ORAL_TABLET | Freq: Every day | ORAL | 1 refills | Status: DC

## 2018-05-09 ENCOUNTER — Encounter: Payer: Self-pay | Admitting: Emergency Medicine

## 2018-05-09 DIAGNOSIS — F4001 Agoraphobia with panic disorder: Secondary | ICD-10-CM | POA: Insufficient documentation

## 2018-06-28 ENCOUNTER — Ambulatory Visit: Payer: Medicare Other | Admitting: Psychiatry

## 2018-06-28 ENCOUNTER — Other Ambulatory Visit: Payer: Self-pay

## 2018-06-28 ENCOUNTER — Encounter: Payer: Self-pay | Admitting: Psychiatry

## 2018-06-28 DIAGNOSIS — F5105 Insomnia due to other mental disorder: Secondary | ICD-10-CM | POA: Diagnosis not present

## 2018-06-28 DIAGNOSIS — F4001 Agoraphobia with panic disorder: Secondary | ICD-10-CM | POA: Diagnosis not present

## 2018-06-28 DIAGNOSIS — F331 Major depressive disorder, recurrent, moderate: Secondary | ICD-10-CM | POA: Diagnosis not present

## 2018-06-28 MED ORDER — BUPROPION HCL ER (XL) 150 MG PO TB24: ORAL_TABLET | ORAL | 0 refills | Status: DC

## 2018-06-28 NOTE — Progress Notes (Signed)
Australia Droll [id number] Jan 12, 1951 68 y.o.  Subjective:   Patient ID:  Tiffany Leon is a 68 y.o. (DOB 11/19/1950) female.  Chief Complaint:  Chief Complaint  Patient presents with  . Follow-up    Medication Management  . Depression    HPI Tiffany Leon presents to the office today for follow-up of depression and anxiety.  Last seen July.  Picks up GD daily from school and keeps on Saturday.  She's 6 and full of energy. She's very sensitive.  Not going out otherwise unless has to.  Stress from GD mother Tiffany Leon not parenting well.  Worries over Tiffany Leon's BF doing witchcraft.  Overall pretty good but then acknowledges depression.  Will be better in the Spring.  Tendency to seasonal depression.  Pt reports that mood is Anxious and Depressed and describes anxiety as Minimal. Anxiety symptoms include: Excessive Worry,. Pt reports no sleep issues. Pt reports that appetite is good. Pt reports that energy is poor and loss of interest or pleasure in usual activities, poor motivation and withdrawn from usual activities. Concentration is down slightly. Suicidal thoughts:  denied by patient.   Review of Systems:  Review of Systems  Constitutional: Positive for fatigue.  Psychiatric/Behavioral: Positive for dysphoric mood. Negative for agitation, behavioral problems, confusion, decreased concentration, hallucinations, self-injury, sleep disturbance and suicidal ideas. The patient is not nervous/anxious and is not hyperactive.     Medications: I have reviewed the patient's current medications.  Current Outpatient Medications  Medication Sig Dispense Refill  . clonazePAM (KLONOPIN) 0.5 MG tablet Take 0.5 mg by mouth 2 (two) times daily as needed for anxiety.    Marland Kitchen levothyroxine (SYNTHROID, LEVOTHROID) 50 MCG tablet Take 50 mcg by mouth daily before breakfast.    . PARoxetine (PAXIL) 40 MG tablet Take 1.5 tablets (60 mg total) by mouth at bedtime. 135 tablet 1  . traZODone (DESYREL) 50 MG tablet Take  50-100 mg by mouth at bedtime.    Marland Kitchen buPROPion (WELLBUTRIN XL) 150 MG 24 hr tablet 1 in am for 1 week, then 2 daily 30 tablet 0   No current facility-administered medications for this visit.     Medication Side Effects: None  Allergies:  Allergies  Allergen Reactions  . Codeine Nausea And Vomiting    Past Medical History:  Diagnosis Date  . Anxiety   . Depression   . Emphysema lung ([place])   . GERD (gastroesophageal reflux disease)   . History of bronchitis   . History of kidney stones   . Hypothyroidism     History reviewed. No pertinent family history.  Social History   Socioeconomic History  . Marital status: Married    Spouse name: Not on file  . Number of children: Not on file  . Years of education: Not on file  . Highest education level: Not on file  Occupational History  . Not on file  Social Needs  . Financial resource strain: Not on file  . Food insecurity:    Worry: Not on file    Inability: Not on file  . Transportation needs:    Medical: Not on file    Non-medical: Not on file  Tobacco Use  . Smoking status: Current Every Day Smoker    Packs/day: 1.00    Years: 40.00    Pack years: 40.00  . Smokeless tobacco: Never Used  Substance and Sexual Activity  . Alcohol use: Yes    Comment: 1-2 gl wine / night  . Drug use: Never  . Sexual  activity: Not on file  Lifestyle  . Physical activity:    Days per week: Not on file    Minutes per session: Not on file  . Stress: Not on file  Relationships  . Social connections:    Talks on phone: Not on file    Gets together: Not on file    Attends religious service: Not on file    Active member of club or organization: Not on file    Attends meetings of clubs or organizations: Not on file    Relationship status: Not on file  . Intimate partner violence:    Fear of current or ex partner: Not on file    Emotionally abused: Not on file    Physically abused: Not on file    Forced sexual activity: Not on file   Other Topics Concern  . Not on file  Social History Narrative  . Not on file    Past Medical History, Surgical history, Social history, and Family history were reviewed and updated as appropriate.   Please see review of systems for further details on the patient's review from today.   Objective:   Physical Exam:  There were no vitals taken for this visit.  Physical Exam Constitutional:      General: She is not in acute distress.    Appearance: She is well-developed.  Musculoskeletal:        General: No deformity.  Neurological:     Mental Status: She is alert and oriented to person, place, and time.     Motor: No tremor.     Coordination: Coordination normal.     Gait: Gait normal.  Psychiatric:        Attention and Perception: Attention normal. She is attentive. She does not perceive auditory hallucinations.        Mood and Affect: Mood is depressed. Mood is not anxious. Affect is not labile, blunt, angry or inappropriate.        Speech: Speech normal.        Behavior: Behavior normal.        Thought Content: Thought content normal. Thought content does not include homicidal or suicidal ideation. Thought content does not include homicidal or suicidal plan.        Cognition and Memory: Cognition normal.        Judgment: Judgment normal.     Comments: Insight is good. Anxiety manageable situational.     Lab Review:     Component Value Date/Time   NA 142 12/22/2017 0843   K 4.1 12/22/2017 0843   CL 108 12/22/2017 0843   CO2 25 12/22/2017 0843   GLUCOSE 109 (H) 12/22/2017 0843   BUN 14 12/22/2017 0843   CREATININE 0.72 12/22/2017 0843   CALCIUM 9.3 12/22/2017 0843   GFRNONAA >60 12/22/2017 0843   GFRAA >60 12/22/2017 0843       Component Value Date/Time   WBC 4.1 12/22/2017 0843   RBC 4.30 12/22/2017 0843   HGB 14.6 12/22/2017 0843   HCT 44.2 12/22/2017 0843   PLT 158 12/22/2017 0843   MCV 102.8 (H) 12/22/2017 0843   MCH 34.0 12/22/2017 0843   MCHC 33.0  12/22/2017 0843   RDW 13.2 12/22/2017 0843   Normal TSH per PCP.  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  She describes a tendency to have more depression in the winter.  She does have a number of stressors. Took Wellbutrin before with benefit.  Disc SE.  Drinks only 1 coffee and 1 pepsi daily.  She would like to restart the Wellbutrin.  She benefited from 300 mg daily in the past.  Discussed the risk of worsening anxiety.  Disc behavioral techniques.  Encourage increased activity and goal-directed behavior as she sees improvement with the Wellbutrin.  Encouraged social another types of stimulation.  FU 6 mos.  Call if there is no response to the Wellbutrin and we would consider Abilify.  Discussed that option as well.  Lynder Parents, MD, DFAPA  Please see After Visit Summary for patient specific instructions.  Future Appointments  Date Time Provider [place]  12/20/2018  9:00 AM Cottle, Billey Co., MD [facility] None    No orders of the defined types were placed in this encounter.     -------------------------------

## 2018-07-09 ENCOUNTER — Other Ambulatory Visit: Payer: Self-pay | Admitting: Psychiatry

## 2018-08-14 ENCOUNTER — Other Ambulatory Visit: Payer: Self-pay | Admitting: Gastroenterology

## 2018-08-14 DIAGNOSIS — R110 Nausea: Secondary | ICD-10-CM

## 2018-08-14 DIAGNOSIS — R1110 Vomiting, unspecified: Secondary | ICD-10-CM

## 2018-08-16 ENCOUNTER — Ambulatory Visit
Admission: RE | Admit: 2018-08-16 | Discharge: 2018-08-16 | Disposition: A | Discharge status: Home / Self Care | Payer: Medicare Other | Source: Ambulatory Visit | Attending: Gastroenterology | Admitting: Gastroenterology

## 2018-08-16 ENCOUNTER — Other Ambulatory Visit: Payer: Self-pay

## 2018-08-16 DIAGNOSIS — R1110 Vomiting, unspecified: Secondary | ICD-10-CM

## 2018-08-16 DIAGNOSIS — R110 Nausea: Secondary | ICD-10-CM

## 2018-08-24 ENCOUNTER — Other Ambulatory Visit: Payer: Self-pay | Admitting: Gastroenterology

## 2018-08-24 DIAGNOSIS — R634 Abnormal weight loss: Secondary | ICD-10-CM

## 2018-08-24 DIAGNOSIS — R112 Nausea with vomiting, unspecified: Secondary | ICD-10-CM

## 2018-09-01 ENCOUNTER — Ambulatory Visit
Admission: RE | Admit: 2018-09-01 | Discharge: 2018-09-01 | Disposition: A | Discharge status: Home / Self Care | Payer: Medicare Other | Source: Ambulatory Visit | Attending: Gastroenterology | Admitting: Gastroenterology

## 2018-09-01 ENCOUNTER — Other Ambulatory Visit: Payer: Self-pay

## 2018-09-01 ENCOUNTER — Other Ambulatory Visit: Payer: Self-pay | Admitting: Gastroenterology

## 2018-09-01 DIAGNOSIS — R112 Nausea with vomiting, unspecified: Secondary | ICD-10-CM

## 2018-09-01 DIAGNOSIS — R634 Abnormal weight loss: Secondary | ICD-10-CM

## 2018-09-08 ENCOUNTER — Other Ambulatory Visit: Payer: Self-pay | Admitting: Psychiatry

## 2018-09-18 DIAGNOSIS — J90 Pleural effusion, not elsewhere classified: Secondary | ICD-10-CM

## 2018-09-18 HISTORY — DX: Pleural effusion, not elsewhere classified: J90

## 2018-09-19 ENCOUNTER — Inpatient Hospital Stay (HOSPITAL_COMMUNITY)
Admission: EM | Admit: 2018-09-19 | Discharge: 2018-09-24 | DRG: 291 | Disposition: A | Discharge status: Home / Self Care | Payer: Medicare Other | Attending: Internal Medicine | Admitting: Internal Medicine

## 2018-09-19 ENCOUNTER — Encounter (HOSPITAL_COMMUNITY): Payer: Self-pay

## 2018-09-19 ENCOUNTER — Other Ambulatory Visit: Payer: Self-pay

## 2018-09-19 ENCOUNTER — Emergency Department (HOSPITAL_COMMUNITY): Payer: Medicare Other

## 2018-09-19 DIAGNOSIS — D500 Iron deficiency anemia secondary to blood loss (chronic): Secondary | ICD-10-CM

## 2018-09-19 DIAGNOSIS — I700 Atherosclerosis of aorta: Secondary | ICD-10-CM | POA: Diagnosis present

## 2018-09-19 DIAGNOSIS — R112 Nausea with vomiting, unspecified: Secondary | ICD-10-CM | POA: Diagnosis not present

## 2018-09-19 DIAGNOSIS — Z87442 Personal history of urinary calculi: Secondary | ICD-10-CM

## 2018-09-19 DIAGNOSIS — I160 Hypertensive urgency: Secondary | ICD-10-CM | POA: Diagnosis present

## 2018-09-19 DIAGNOSIS — E785 Hyperlipidemia, unspecified: Secondary | ICD-10-CM | POA: Diagnosis present

## 2018-09-19 DIAGNOSIS — I130 Hypertensive heart and chronic kidney disease with heart failure and stage 1 through stage 4 chronic kidney disease, or unspecified chronic kidney disease: Secondary | ICD-10-CM | POA: Diagnosis not present

## 2018-09-19 DIAGNOSIS — F329 Major depressive disorder, single episode, unspecified: Secondary | ICD-10-CM | POA: Diagnosis present

## 2018-09-19 DIAGNOSIS — K219 Gastro-esophageal reflux disease without esophagitis: Secondary | ICD-10-CM | POA: Diagnosis present

## 2018-09-19 DIAGNOSIS — N289 Disorder of kidney and ureter, unspecified: Secondary | ICD-10-CM

## 2018-09-19 DIAGNOSIS — Z823 Family history of stroke: Secondary | ICD-10-CM

## 2018-09-19 DIAGNOSIS — I313 Pericardial effusion (noninflammatory): Secondary | ICD-10-CM | POA: Diagnosis present

## 2018-09-19 DIAGNOSIS — N189 Chronic kidney disease, unspecified: Secondary | ICD-10-CM | POA: Diagnosis present

## 2018-09-19 DIAGNOSIS — B353 Tinea pedis: Secondary | ICD-10-CM | POA: Diagnosis present

## 2018-09-19 DIAGNOSIS — J90 Pleural effusion, not elsewhere classified: Secondary | ICD-10-CM | POA: Diagnosis present

## 2018-09-19 DIAGNOSIS — F17210 Nicotine dependence, cigarettes, uncomplicated: Secondary | ICD-10-CM | POA: Diagnosis present

## 2018-09-19 DIAGNOSIS — R609 Edema, unspecified: Secondary | ICD-10-CM | POA: Diagnosis not present

## 2018-09-19 DIAGNOSIS — J449 Chronic obstructive pulmonary disease, unspecified: Secondary | ICD-10-CM | POA: Diagnosis present

## 2018-09-19 DIAGNOSIS — Z20828 Contact with and (suspected) exposure to other viral communicable diseases: Secondary | ICD-10-CM | POA: Diagnosis present

## 2018-09-19 DIAGNOSIS — D649 Anemia, unspecified: Secondary | ICD-10-CM | POA: Diagnosis present

## 2018-09-19 DIAGNOSIS — N200 Calculus of kidney: Secondary | ICD-10-CM

## 2018-09-19 DIAGNOSIS — Z885 Allergy status to narcotic agent status: Secondary | ICD-10-CM

## 2018-09-19 DIAGNOSIS — N183 Chronic kidney disease, stage 3 unspecified: Secondary | ICD-10-CM | POA: Diagnosis present

## 2018-09-19 DIAGNOSIS — Z79899 Other long term (current) drug therapy: Secondary | ICD-10-CM

## 2018-09-19 DIAGNOSIS — E8809 Other disorders of plasma-protein metabolism, not elsewhere classified: Secondary | ICD-10-CM | POA: Diagnosis present

## 2018-09-19 DIAGNOSIS — I2510 Atherosclerotic heart disease of native coronary artery without angina pectoris: Secondary | ICD-10-CM | POA: Diagnosis present

## 2018-09-19 DIAGNOSIS — I5043 Acute on chronic combined systolic (congestive) and diastolic (congestive) heart failure: Secondary | ICD-10-CM | POA: Diagnosis present

## 2018-09-19 DIAGNOSIS — J439 Emphysema, unspecified: Secondary | ICD-10-CM | POA: Diagnosis present

## 2018-09-19 DIAGNOSIS — Z720 Tobacco use: Secondary | ICD-10-CM | POA: Diagnosis present

## 2018-09-19 DIAGNOSIS — R946 Abnormal results of thyroid function studies: Secondary | ICD-10-CM | POA: Diagnosis present

## 2018-09-19 DIAGNOSIS — R0602 Shortness of breath: Secondary | ICD-10-CM | POA: Diagnosis present

## 2018-09-19 DIAGNOSIS — N179 Acute kidney failure, unspecified: Secondary | ICD-10-CM | POA: Diagnosis present

## 2018-09-19 DIAGNOSIS — Z801 Family history of malignant neoplasm of trachea, bronchus and lung: Secondary | ICD-10-CM

## 2018-09-19 DIAGNOSIS — R531 Weakness: Secondary | ICD-10-CM

## 2018-09-19 DIAGNOSIS — Z8249 Family history of ischemic heart disease and other diseases of the circulatory system: Secondary | ICD-10-CM

## 2018-09-19 DIAGNOSIS — E039 Hypothyroidism, unspecified: Secondary | ICD-10-CM | POA: Diagnosis present

## 2018-09-19 DIAGNOSIS — Z79890 Hormone replacement therapy (postmenopausal): Secondary | ICD-10-CM

## 2018-09-19 DIAGNOSIS — F419 Anxiety disorder, unspecified: Secondary | ICD-10-CM | POA: Diagnosis present

## 2018-09-19 HISTORY — DX: Other forms of angina pectoris: I20.89

## 2018-09-19 HISTORY — DX: Tobacco use: Z72.0

## 2018-09-19 HISTORY — DX: Other forms of dyspnea: R06.09

## 2018-09-19 HISTORY — DX: Atherosclerosis of aorta: I70.0

## 2018-09-19 HISTORY — DX: Diaphragmatic hernia without obstruction or gangrene: K44.9

## 2018-09-19 HISTORY — DX: Other forms of angina pectoris: I20.8

## 2018-09-19 HISTORY — DX: Peripheral vascular disease, unspecified: I73.9

## 2018-09-19 HISTORY — DX: Chronic combined systolic (congestive) and diastolic (congestive) heart failure: I50.42

## 2018-09-19 HISTORY — DX: Other specified forms of tremor: G25.2

## 2018-09-19 HISTORY — DX: Dyspnea, unspecified: R06.00

## 2018-09-19 HISTORY — DX: Acute kidney failure, unspecified: N17.9

## 2018-09-19 LAB — BASIC METABOLIC PANEL
Anion gap: 12 (ref 5–15)
BUN: 31 mg/dL — ABNORMAL HIGH (ref 8–23)
CO2: 19 mmol/L — ABNORMAL LOW (ref 22–32)
Calcium: 8.7 mg/dL — ABNORMAL LOW (ref 8.9–10.3)
Chloride: 110 mmol/L (ref 98–111)
Creatinine, Ser: 1.86 mg/dL — ABNORMAL HIGH (ref 0.44–1.00)
GFR calc Af Amer: 31 mL/min — ABNORMAL LOW (ref >60)
GFR calc non Af Amer: 27 mL/min — ABNORMAL LOW (ref >60)
Glucose, Bld: 104 mg/dL — ABNORMAL HIGH (ref 70–99)
Potassium: 3.6 mmol/L (ref 3.5–5.1)
Sodium: 141 mmol/L (ref 135–145)

## 2018-09-19 LAB — CBC
HCT: 26.3 % — ABNORMAL LOW (ref 36.0–46.0)
Hemoglobin: 9 g/dL — ABNORMAL LOW (ref 12.0–15.0)
MCH: 32.8 pg (ref 26.0–34.0)
MCHC: 34.2 g/dL (ref 30.0–36.0)
MCV: 96 fL (ref 80.0–100.0)
Platelets: 199 10*3/uL (ref 150–400)
RBC: 2.74 MIL/uL — ABNORMAL LOW (ref 3.87–5.11)
RDW: 13.2 % (ref 11.5–15.5)
WBC: 4.1 10*3/uL (ref 4.0–10.5)
nRBC: 0 % (ref 0.0–0.2)

## 2018-09-19 LAB — I-STAT TROPONIN, ED: Troponin i, poc: 0.07 ng/mL (ref 0.00–0.08)

## 2018-09-19 NOTE — ED Triage Notes (Addendum)
Pt endorses n/v/d x 2 months and chest pain that is worse at night. VSS. Afebrile. Pt tested at [facility] for covid19 today due to cough x 2 weeks ago, does not have results.

## 2018-09-20 ENCOUNTER — Encounter (HOSPITAL_COMMUNITY): Payer: Self-pay | Admitting: Student

## 2018-09-20 ENCOUNTER — Observation Stay (HOSPITAL_COMMUNITY): Payer: Medicare Other

## 2018-09-20 ENCOUNTER — Observation Stay (HOSPITAL_BASED_OUTPATIENT_CLINIC_OR_DEPARTMENT_OTHER): Payer: Medicare Other

## 2018-09-20 ENCOUNTER — Emergency Department (HOSPITAL_COMMUNITY): Payer: Medicare Other

## 2018-09-20 DIAGNOSIS — J439 Emphysema, unspecified: Secondary | ICD-10-CM | POA: Diagnosis not present

## 2018-09-20 DIAGNOSIS — Z720 Tobacco use: Secondary | ICD-10-CM | POA: Diagnosis present

## 2018-09-20 DIAGNOSIS — R112 Nausea with vomiting, unspecified: Secondary | ICD-10-CM | POA: Diagnosis not present

## 2018-09-20 DIAGNOSIS — J90 Pleural effusion, not elsewhere classified: Secondary | ICD-10-CM | POA: Diagnosis present

## 2018-09-20 DIAGNOSIS — I5043 Acute on chronic combined systolic (congestive) and diastolic (congestive) heart failure: Secondary | ICD-10-CM

## 2018-09-20 DIAGNOSIS — I160 Hypertensive urgency: Secondary | ICD-10-CM | POA: Diagnosis not present

## 2018-09-20 DIAGNOSIS — N200 Calculus of kidney: Secondary | ICD-10-CM

## 2018-09-20 DIAGNOSIS — N179 Acute kidney failure, unspecified: Secondary | ICD-10-CM

## 2018-09-20 DIAGNOSIS — N183 Chronic kidney disease, stage 3 unspecified: Secondary | ICD-10-CM | POA: Diagnosis present

## 2018-09-20 DIAGNOSIS — R0602 Shortness of breath: Secondary | ICD-10-CM | POA: Diagnosis present

## 2018-09-20 DIAGNOSIS — I2510 Atherosclerotic heart disease of native coronary artery without angina pectoris: Secondary | ICD-10-CM

## 2018-09-20 DIAGNOSIS — J449 Chronic obstructive pulmonary disease, unspecified: Secondary | ICD-10-CM | POA: Diagnosis present

## 2018-09-20 DIAGNOSIS — I313 Pericardial effusion (noninflammatory): Secondary | ICD-10-CM

## 2018-09-20 DIAGNOSIS — B353 Tinea pedis: Secondary | ICD-10-CM | POA: Diagnosis present

## 2018-09-20 DIAGNOSIS — E039 Hypothyroidism, unspecified: Secondary | ICD-10-CM | POA: Diagnosis present

## 2018-09-20 HISTORY — DX: Atherosclerotic heart disease of native coronary artery without angina pectoris: I25.10

## 2018-09-20 HISTORY — PX: IR THORACENTESIS ASP PLEURAL SPACE W/IMG GUIDE: IMG5380

## 2018-09-20 LAB — HEPATIC FUNCTION PANEL
ALT: 12 U/L (ref 0–44)
AST: 16 U/L (ref 15–41)
Albumin: 3 g/dL — ABNORMAL LOW (ref 3.5–5.0)
Alkaline Phosphatase: 69 U/L (ref 38–126)
Bilirubin, Direct: <0.1 mg/dL (ref 0.0–0.2)
Total Bilirubin: 0.7 mg/dL (ref 0.3–1.2)
Total Protein: 6.7 g/dL (ref 6.5–8.1)

## 2018-09-20 LAB — BODY FLUID CELL COUNT WITH DIFFERENTIAL
Eos, Fluid: 0 %
Lymphs, Fluid: 73 %
Monocyte-Macrophage-Serous Fluid: 26 % — ABNORMAL LOW (ref 50–90)
Neutrophil Count, Fluid: 1 % (ref 0–25)
Total Nucleated Cell Count, Fluid: 313 cu mm (ref 0–1000)

## 2018-09-20 LAB — URINALYSIS, ROUTINE W REFLEX MICROSCOPIC
Bilirubin Urine: NEGATIVE
Glucose, UA: NEGATIVE mg/dL
Ketones, ur: 5 mg/dL — AB
Leukocytes,Ua: NEGATIVE
Nitrite: NEGATIVE
Protein, ur: >=300 mg/dL — AB
Specific Gravity, Urine: 1.018 (ref 1.005–1.030)
pH: 5 (ref 5.0–8.0)

## 2018-09-20 LAB — GLUCOSE, PLEURAL OR PERITONEAL FLUID: Glucose, Fluid: 98 mg/dL

## 2018-09-20 LAB — BASIC METABOLIC PANEL
Anion gap: 8 (ref 5–15)
BUN: 32 mg/dL — ABNORMAL HIGH (ref 8–23)
CO2: 21 mmol/L — ABNORMAL LOW (ref 22–32)
Calcium: 8.5 mg/dL — ABNORMAL LOW (ref 8.9–10.3)
Chloride: 112 mmol/L — ABNORMAL HIGH (ref 98–111)
Creatinine, Ser: 1.7 mg/dL — ABNORMAL HIGH (ref 0.44–1.00)
GFR calc Af Amer: 35 mL/min — ABNORMAL LOW (ref >60)
GFR calc non Af Amer: 30 mL/min — ABNORMAL LOW (ref >60)
Glucose, Bld: 91 mg/dL (ref 70–99)
Potassium: 3.7 mmol/L (ref 3.5–5.1)
Sodium: 141 mmol/L (ref 135–145)

## 2018-09-20 LAB — BRAIN NATRIURETIC PEPTIDE: B Natriuretic Peptide: 608.1 pg/mL — ABNORMAL HIGH (ref 0.0–100.0)

## 2018-09-20 LAB — GRAM STAIN

## 2018-09-20 LAB — SARS CORONAVIRUS 2 BY RT PCR (HOSPITAL ORDER, PERFORMED IN ~~LOC~~ HOSPITAL LAB): SARS Coronavirus 2: NEGATIVE

## 2018-09-20 LAB — ABO/RH: ABO/RH(D): O POS

## 2018-09-20 LAB — PROCALCITONIN: Procalcitonin: <0.1 ng/mL

## 2018-09-20 LAB — TYPE AND SCREEN
ABO/RH(D): O POS
Antibody Screen: NEGATIVE

## 2018-09-20 LAB — LACTATE DEHYDROGENASE, PLEURAL OR PERITONEAL FLUID: LD, Fluid: 62 U/L — ABNORMAL HIGH (ref 3–23)

## 2018-09-20 LAB — ECHOCARDIOGRAM COMPLETE

## 2018-09-20 LAB — LIPID PANEL
Cholesterol: 186 mg/dL (ref 0–200)
HDL: 31 mg/dL — ABNORMAL LOW (ref >40)
LDL Cholesterol: 131 mg/dL — ABNORMAL HIGH (ref 0–99)
Total CHOL/HDL Ratio: 6 RATIO
Triglycerides: 120 mg/dL (ref <150)
VLDL: 24 mg/dL (ref 0–40)

## 2018-09-20 LAB — FERRITIN: Ferritin: 288 ng/mL (ref 11–307)

## 2018-09-20 LAB — C-REACTIVE PROTEIN: CRP: 2.9 mg/dL — ABNORMAL HIGH (ref <1.0)

## 2018-09-20 LAB — CREATININE, URINE, RANDOM: Creatinine, Urine: 193.54 mg/dL

## 2018-09-20 LAB — POC OCCULT BLOOD, ED: Fecal Occult Bld: NEGATIVE

## 2018-09-20 LAB — FOLATE: Folate: 4.3 ng/mL — ABNORMAL LOW (ref >5.9)

## 2018-09-20 LAB — PROTEIN, PLEURAL OR PERITONEAL FLUID: Total protein, fluid: <3 g/dL

## 2018-09-20 LAB — I-STAT TROPONIN, ED: Troponin i, poc: 0.06 ng/mL (ref 0.00–0.08)

## 2018-09-20 LAB — RETICULOCYTES
Immature Retic Fract: 9 % (ref 2.3–15.9)
RBC.: 2.83 MIL/uL — ABNORMAL LOW (ref 3.87–5.11)
Retic Count, Absolute: 66.8 10*3/uL (ref 19.0–186.0)
Retic Ct Pct: 2.4 % (ref 0.4–3.1)

## 2018-09-20 LAB — IRON AND TIBC
Iron: 38 ug/dL (ref 28–170)
Saturation Ratios: 15 % (ref 10.4–31.8)
TIBC: 258 ug/dL (ref 250–450)
UIBC: 220 ug/dL

## 2018-09-20 LAB — ALBUMIN, PLEURAL OR PERITONEAL FLUID: Albumin, Fluid: <1 g/dL

## 2018-09-20 LAB — HEMOGLOBIN AND HEMATOCRIT, BLOOD
HCT: 25.2 % — ABNORMAL LOW (ref 36.0–46.0)
Hemoglobin: 8.3 g/dL — ABNORMAL LOW (ref 12.0–15.0)

## 2018-09-20 LAB — VITAMIN B12: Vitamin B-12: 595 pg/mL (ref 180–914)

## 2018-09-20 LAB — SEDIMENTATION RATE: Sed Rate: 107 mm/hr — ABNORMAL HIGH (ref 0–22)

## 2018-09-20 LAB — SODIUM, URINE, RANDOM: Sodium, Ur: 31 mmol/L

## 2018-09-20 LAB — TSH: TSH: 5.145 u[IU]/mL — ABNORMAL HIGH (ref 0.350–4.500)

## 2018-09-20 LAB — LACTATE DEHYDROGENASE: LDH: 268 U/L — ABNORMAL HIGH (ref 98–192)

## 2018-09-20 LAB — TROPONIN I: Troponin I: 0.05 ng/mL (ref <0.03)

## 2018-09-20 MED ORDER — ATORVASTATIN CALCIUM 40 MG PO TABS
40.0000 mg | ORAL_TABLET | Freq: Every day | ORAL | Status: DC
  Administered 2018-09-20 – 2018-09-23 (×4): 40 mg via ORAL
  Filled 2018-09-20 (×4): qty 1

## 2018-09-20 MED ORDER — SODIUM CHLORIDE 0.9 % IV SOLN
INTRAVENOUS | Status: DC
  Administered 2018-09-20: 09:00:00 via INTRAVENOUS

## 2018-09-20 MED ORDER — PAROXETINE HCL 30 MG PO TABS
60.0000 mg | ORAL_TABLET | Freq: Every day | ORAL | Status: DC
  Administered 2018-09-20 – 2018-09-23 (×4): 60 mg via ORAL
  Filled 2018-09-20 (×5): qty 2

## 2018-09-20 MED ORDER — POTASSIUM CHLORIDE CRYS ER 20 MEQ PO TBCR
20.0000 meq | EXTENDED_RELEASE_TABLET | Freq: Every day | ORAL | Status: DC
  Administered 2018-09-20 – 2018-09-23 (×4): 20 meq via ORAL
  Filled 2018-09-20 (×4): qty 1

## 2018-09-20 MED ORDER — HYDRALAZINE HCL 20 MG/ML IJ SOLN
10.0000 mg | INTRAMUSCULAR | Status: DC | PRN
  Administered 2018-09-20: 10 mg via INTRAVENOUS
  Filled 2018-09-20: qty 1

## 2018-09-20 MED ORDER — ACETAMINOPHEN 325 MG PO TABS
650.0000 mg | ORAL_TABLET | Freq: Four times a day (QID) | ORAL | Status: DC | PRN
  Administered 2018-09-21 – 2018-09-24 (×5): 650 mg via ORAL
  Filled 2018-09-20 (×6): qty 2

## 2018-09-20 MED ORDER — NICOTINE 21 MG/24HR TD PT24
21.0000 mg | MEDICATED_PATCH | Freq: Every day | TRANSDERMAL | Status: DC
  Administered 2018-09-20 – 2018-09-24 (×5): 21 mg via TRANSDERMAL
  Filled 2018-09-20 (×5): qty 1

## 2018-09-20 MED ORDER — ALBUTEROL SULFATE (2.5 MG/3ML) 0.083% IN NEBU: 2.5000 mg | INHALATION_SOLUTION | Freq: Four times a day (QID) | RESPIRATORY_TRACT | Status: DC | PRN

## 2018-09-20 MED ORDER — PANTOPRAZOLE SODIUM 40 MG IV SOLR
40.0000 mg | Freq: Two times a day (BID) | INTRAVENOUS | Status: DC
  Administered 2018-09-20 – 2018-09-23 (×7): 40 mg via INTRAVENOUS
  Filled 2018-09-20 (×7): qty 40

## 2018-09-20 MED ORDER — CLOTRIMAZOLE 1 % EX SOLN
Freq: Two times a day (BID) | CUTANEOUS | Status: DC
  Filled 2018-09-20 (×2): qty 10

## 2018-09-20 MED ORDER — CLOTRIMAZOLE 1 % EX CREA
TOPICAL_CREAM | Freq: Two times a day (BID) | CUTANEOUS | Status: DC
  Administered 2018-09-20 – 2018-09-21 (×3): via TOPICAL
  Administered 2018-09-22: 1 via TOPICAL
  Administered 2018-09-22 – 2018-09-23 (×3): via TOPICAL
  Filled 2018-09-20 (×2): qty 15

## 2018-09-20 MED ORDER — ONDANSETRON HCL 4 MG/2ML IJ SOLN: 4.0000 mg | Freq: Four times a day (QID) | INTRAMUSCULAR | Status: DC | PRN

## 2018-09-20 MED ORDER — SODIUM CHLORIDE 0.9% FLUSH
3.0000 mL | Freq: Two times a day (BID) | INTRAVENOUS | Status: DC
  Administered 2018-09-20 – 2018-09-24 (×9): 3 mL via INTRAVENOUS

## 2018-09-20 MED ORDER — CARVEDILOL 3.125 MG PO TABS
3.1250 mg | ORAL_TABLET | Freq: Two times a day (BID) | ORAL | Status: DC
  Administered 2018-09-21: 3.125 mg via ORAL
  Filled 2018-09-20: qty 1

## 2018-09-20 MED ORDER — ASPIRIN 81 MG PO CHEW
81.0000 mg | CHEWABLE_TABLET | Freq: Every day | ORAL | Status: DC
  Administered 2018-09-21 – 2018-09-24 (×4): 81 mg via ORAL
  Filled 2018-09-20 (×4): qty 1

## 2018-09-20 MED ORDER — LEVOTHYROXINE SODIUM 75 MCG PO TABS
75.0000 ug | ORAL_TABLET | Freq: Every day | ORAL | Status: DC
  Administered 2018-09-21 – 2018-09-24 (×4): 75 ug via ORAL
  Filled 2018-09-20 (×5): qty 1

## 2018-09-20 MED ORDER — LIDOCAINE HCL (PF) 1 % IJ SOLN
INTRAMUSCULAR | Status: AC | PRN
  Administered 2018-09-20: 10 mL

## 2018-09-20 MED ORDER — TRAZODONE HCL 50 MG PO TABS
50.0000 mg | ORAL_TABLET | Freq: Every day | ORAL | Status: DC
  Administered 2018-09-20: 50 mg via ORAL
  Administered 2018-09-21 – 2018-09-23 (×3): 100 mg via ORAL
  Filled 2018-09-20 (×4): qty 2

## 2018-09-20 MED ORDER — FOLIC ACID 1 MG PO TABS
1.0000 mg | ORAL_TABLET | Freq: Every day | ORAL | Status: DC
  Administered 2018-09-20 – 2018-09-24 (×5): 1 mg via ORAL
  Filled 2018-09-20 (×5): qty 1

## 2018-09-20 MED ORDER — FUROSEMIDE 10 MG/ML IJ SOLN
40.0000 mg | Freq: Two times a day (BID) | INTRAMUSCULAR | Status: DC
  Administered 2018-09-20 – 2018-09-23 (×7): 40 mg via INTRAVENOUS
  Filled 2018-09-20 (×8): qty 4

## 2018-09-20 MED ORDER — LIDOCAINE HCL 1 % IJ SOLN
INTRAMUSCULAR | Status: AC
  Filled 2018-09-20: qty 20

## 2018-09-20 MED ORDER — ACETAMINOPHEN 650 MG RE SUPP: 650.0000 mg | Freq: Four times a day (QID) | RECTAL | Status: DC | PRN

## 2018-09-20 MED ORDER — ONDANSETRON HCL 4 MG PO TABS: 4.0000 mg | ORAL_TABLET | Freq: Four times a day (QID) | ORAL | Status: DC | PRN

## 2018-09-20 NOTE — ED Provider Notes (Signed)
[facility] EMERGENCY DEPARTMENT Provider Note   CSN: [id number] Arrival date & time: 09/19/18  9833    History   Chief Complaint Chief Complaint  Patient presents with  . Emesis  . Diarrhea  . Chest Pain    HPI Tiffany Leon is a 68 y.o. female.   The history is provided by the patient.  She has history of COPD and GERD and comes in because of general weakness.  She has felt bad for approximately 2 months.  During this time she has had nausea with intermittent vomiting.  She had diarrhea for a good portion of this time but states diarrhea stopped about 2 weeks ago.  She denies fever, chills, sweats.  She is not complaining of pain anywhere.  She denies cough.  She has noted that her legs have been swollen for the last 2 weeks.  She did see a gastroenterologist to get a CAT scan of her abdomen.  Her PCP has told her that her kidney were not doing well and she was supposed to see a nephrologist, but the appointment has not been made.  She has also been told that she has become anemic.  Yesterday, she fell and was unable to get up because of weakness.  Of note, she does smoke three-quarter pack cigarettes a day.  Past Medical History:  Diagnosis Date  . Anxiety   . Depression   . Emphysema lung ([place])   . GERD (gastroesophageal reflux disease)   . History of bronchitis   . History of kidney stones   . Hypothyroidism     Patient Active Problem List   Diagnosis Date Noted  . Panic disorder with agoraphobia 05/09/2018    Past Surgical History:  Procedure Laterality Date  . BREAST LUMPECTOMY WITH RADIOACTIVE SEED LOCALIZATION Left 12/28/2017   Procedure: BREAST LUMPECTOMY WITH RADIOACTIVE SEED LOCALIZATION;  Surgeon: Jovita Kussmaul, MD;  Location: [facility];  Service: General;  Laterality: Left;  . DG THUMB RIGHT HAND ([place] HX)    . KIDNEY STONE SURGERY       OB History   No obstetric history on file.      Home Medications    Prior to Admission medications    Medication Sig Start Date End Date Taking? Authorizing Provider  buPROPion (WELLBUTRIN XL) 150 MG 24 hr tablet TAKE 1 TABLET BY MOUTH EVERY DAY IN THE MORNING FOR 1 WEEK, THEN 2 TABLETS DAILY THEREAFTER 07/10/18   Cottle, Billey Co., MD  clonazePAM (KLONOPIN) 0.5 MG tablet Take 0.5 mg by mouth 2 (two) times daily as needed for anxiety.    [provider]  levothyroxine (SYNTHROID, LEVOTHROID) 50 MCG tablet Take 50 mcg by mouth daily before breakfast.    [provider]  PARoxetine (PAXIL) 40 MG tablet TAKE 1.5 TABLETS BY MOUTH DAILY AT BEDTIME 09/08/18   Cottle, Billey Co., MD  traZODone (DESYREL) 50 MG tablet Take 50-100 mg by mouth at bedtime.    [provider]    Family History History reviewed. No pertinent family history.  Social History Social History   Tobacco Use  . Smoking status: Current Every Day Smoker    Packs/day: 1.00    Years: 40.00    Pack years: 40.00  . Smokeless tobacco: Never Used  Substance Use Topics  . Alcohol use: Yes    Comment: 1-2 gl wine / night  . Drug use: Never     Allergies   Codeine   Review of Systems  Review of Systems  All other systems reviewed and are negative.    Physical Exam Updated Vital Signs BP (!) 156/98 (BP Location: Right Arm)   Pulse 98   Temp 98.1 F (36.7 C) (Oral)   Resp 18   SpO2 99%   Physical Exam Vitals signs and nursing note reviewed.    67 year old female, resting comfortably and in no acute distress. Vital signs are significant for elevated blood pressure. Oxygen saturation is 99%, which is normal. Head is normocephalic and atraumatic. PERRLA, EOMI. Oropharynx is clear. Neck is nontender and supple without adenopathy or JVD. Back is nontender and there is no CVA tenderness.  There is 1-2+ presacral edema. Lungs are clear without rales, wheezes, or rhonchi. Chest is nontender. Heart has regular rate and rhythm without murmur. Abdomen is soft, flat, nontender without masses or  hepatosplenomegaly and peristalsis is normoactive. Rectal: Normal sphincter tone.  Small amount of light tan stool present and sent for Hemoccult testing. Extremities have 2+ edema, full range of motion is present. Skin is warm and dry without rash. Neurologic: Mental status is normal, cranial nerves are intact, there are no motor or sensory deficits.  ED Treatments / Results  Labs (all labs ordered are listed, but only abnormal results are displayed) Labs Reviewed  BASIC METABOLIC PANEL - Abnormal; Notable for the following components:      Result Value   CO2 19 (*)    Glucose, Bld 104 (*)    BUN 31 (*)    Creatinine, Ser 1.86 (*)    Calcium 8.7 (*)    GFR calc non Af Amer 27 (*)    GFR calc Af Amer 31 (*)    All other components within normal limits  CBC - Abnormal; Notable for the following components:   RBC 2.74 (*)    Hemoglobin 9.0 (*)    HCT 26.3 (*)    All other components within normal limits  HEPATIC FUNCTION PANEL - Abnormal; Notable for the following components:   Albumin 3.0 (*)    All other components within normal limits  RETICULOCYTES - Abnormal; Notable for the following components:   RBC. 2.83 (*)    All other components within normal limits  SARS CORONAVIRUS 2 (HOSPITAL ORDER, [facility])  VITAMIN B12  IRON AND TIBC  FERRITIN  FOLATE  I-STAT TROPONIN, ED  I-STAT TROPONIN, ED  POC OCCULT BLOOD, ED  TYPE AND SCREEN  ABO/RH    EKG EKG Interpretation  Date/Time:  Tuesday September 19 2018 18:59:59 EDT Ventricular Rate:  101 PR Interval:  124 QRS Duration: 82 QT Interval:  366 QTC Calculation: 474 R Axis:   83 Text Interpretation:  Sinus tachycardia Septal infarct , age undetermined Abnormal ECG When compared with ECG of 2/*26/2004, No significant change was found Confirmed by Delora Fuel ([id number]) on 09/19/2018 11:44:55 PM   Radiology Dg Chest 2 View  Result Date: 09/19/2018 CLINICAL DATA:  Shortness of breath, productive  cough. EXAM: CHEST - 2 VIEW COMPARISON:  Radiographs of August 08, 2014. FINDINGS: The heart size and mediastinal contours are within normal limits. Left lung is clear. Right lower lobe atelectasis or infiltrate is noted with mild right pleural effusion. The visualized skeletal structures are unremarkable. IMPRESSION: Right lower lobe opacity is noted concerning for atelectasis or pneumonia with mild right pleural effusion. Electronically Signed   By: Marijo Conception M.D.   On: 09/19/2018 19:39   Ct Chest Wo Contrast  Result  Date: 09/20/2018 CLINICAL DATA:  Shortness of breath and cough for 2 weeks EXAM: CT CHEST WITHOUT CONTRAST TECHNIQUE: Multidetector CT imaging of the chest was performed following the standard protocol without IV contrast. COMPARISON:  Chest x-ray from 2 days ago.  Abdominal CT 09/01/2018 FINDINGS: Cardiovascular: Normal heart size. Small pericardial effusion. Aortic and coronary atherosclerosis. Mediastinum/Nodes: Negative for adenopathy or mass. Lungs/Pleura: Hyperinflation with mild emphysema. There is a moderate right and small left layering pleural effusion with atelectasis or scarring. Upper Abdomen: Bilateral nephrolithiasis with a 11 mm stone at the right UPJ. No urinary obstruction where visualized. Musculoskeletal: No acute or aggressive finding IMPRESSION: 1. Pleural effusion that is moderate on the right and small on the left, increased from 09/01/2018 abdominal CT. 2. Small pericardial effusion which is new from prior. 3. Atherosclerosis and bilateral nephrolithiasis. 4. Mild emphysema. Electronically Signed   By: Monte Fantasia M.D.   On: 09/20/2018 04:19    Procedures Procedures   Medications Ordered in ED Medications - No data to display   Initial Impression / Assessment and Plan / ED Course  I have reviewed the triage vital signs and the nursing notes.  Pertinent labs & imaging results that were available during my care of the patient were reviewed by me and  considered in my medical decision making (see chart for details).  Generalized weakness with nausea and new onset edema.  Labs are significant for creatinine of 1.86 compared with 0.72 on 12/22/2017.  BUN has gone up a comparable amount.  CO2 is slightly low, but with a normal anion gap.  Hemoglobin is 9.0.  RBC indices are normal, but had previously been macrocytic.  Last hemoglobin on record was 14.6 on 12/22/2017.  Chest x-ray is read by radiologist as showing possible pneumonia, but she does not clinically have pneumonia.  I am worried with her smoking history of possible occult lung cancer and will send for CT of chest.  Old records are reviewed, and CT of abdomen and pelvis done on 09/01/2018 did show a right pleural effusion which is also noted on chest x-ray today.  CT scan shows pleural effusion and small pericardial effusion.  COVID-19 screen is negative.  Hepatic function panel shows low albumin which is partly contributing to her edema.  Case is discussed with Dr. Hal Hope of [facility], who agrees to admit the patient.  Tiffany Leon was evaluated in Emergency Department on 09/20/2018 for the symptoms described in the history of present illness. She was evaluated in the context of the global COVID-19 pandemic, which necessitated consideration that the patient might be at risk for infection with the SARS-CoV-2 virus that causes COVID-19. Institutional protocols and algorithms that pertain to the evaluation of patients at risk for COVID-19 are in a state of rapid change based on information released by regulatory bodies including the CDC and federal and state organizations. These policies and algorithms were followed during the patient's care in the ED.  Final Clinical Impressions(s) / ED Diagnoses   Final diagnoses:  Weakness  Renal insufficiency  Normochromic normocytic anemia  Peripheral edema  Pleural effusion on right    ED Discharge Orders    None       Delora Fuel, MD  56/21/30 [phone]

## 2018-09-20 NOTE — Progress Notes (Addendum)
Patient status post thoracentesis removing 900 mL of fluid from the right lung.  Fluid appears to be transudate, but will need to follow-up LDH.  TSH elevated at 5.145.  Will need to compare with previous check at primary care provider office.  Continue current dose of levothyroxine for now.  Echocardiogram revealing EF of 45-50%.  Suspect heart failure is likely cause of symptoms.  Cardiology consulted for further evaluation given small pericardial effusion along with atherosclerotic plaque of the heart and aorta.  Start atorvastatin.

## 2018-09-20 NOTE — H&P (Signed)
History and Physical    Tiffany Leon XMI:[id number] DOB: Feb 15, 1951 DOA: 09/19/2018  Referring MD/NP/PA: Gean Birchwood, MD PCP: Tiffany Nip, MD  Patient coming from: Home  Chief Complaint: Nausea, vomiting, and weakness  I have personally briefly reviewed patient's old medical records in [place]   HPI: Tiffany Leon is a 68 y.o. female with medical history significant of emphysema, hypothyroidism, anxiety, depression, nephrolithiasis, and GERD; who presented with complaints of nausea, vomiting, and generalized weakness over the last 2 months.  She states that emesis is nonbloody and she is able to keep small amount of food and liquids down.  Notes that she just has poor appetite and despite this is had increasing her weight approximately 15 pounds over the last 1 to 2 months.  In the last 3 weeks the diarrhea symptoms have eased off.  Couple weeks ago she reported being increased on her dose of levothyroxine from 50 mcg to 75 mcg/day.  Her primary physician had referred her to Dr. Michail Leon gastroenterology who had ordered ultrasound and CAT scan of the abdomen and pelvis.  Ultrasound of the abdomen on 4/29, was noted to be within normal limits.  CTon 5/15 noted, bilateral nephrolithiasis with a 6 mm calculus in the right renal pelvis without signs of hydronephrosis,a nd small pleural effusion with bilateral atelectasis.  Her primary care provider had plan to refer her to [facility] for further work-up. Associated symptoms include shortness of breath, coughing, leg swelling with rash of bilateral feet that is itchy at night, wheezing at night, anxiety with panic attacks, and orthopnea.  She is not on oxygen or inhalers at home.  Patient admits to smoking 3/4 pack of cigarettes per day on average and has done so since the age of 22.  Denies having any significant fever, sick contacts, loss of consciousness, blood in stool, dark stools, abdominal pain, or dysuria symptoms.    ED  Course: Upon admission into the emergency department patient seen to be afebrile, pulse 87-104, respirations 16-34, blood pressure elevated up to 182/97, and O2 saturations maintained on room air.  Labs revealed WBC 4.1, hemoglobin 9, BUN 31, creatinine 1.86, iron studies within normal limits, folate 4.3, and troponin i 0.07.  COVID-19 screening negative.  A CT scan of the chest was obtained noting a moderate right-sided pleural effusion and smaller left-sided pleural effusion progressed from previous, bilateral nephrolithiasis with 11 m stone at right JVP junction, and atherosclerosis of the aorta.  TRH called to admit.  Review of Systems  Constitutional: Positive for malaise/fatigue. Negative for fever.  HENT: Negative for ear discharge and nosebleeds.   Eyes: Negative for photophobia and pain.  Respiratory: Positive for cough, shortness of breath and wheezing.   Cardiovascular: Positive for orthopnea and leg swelling. Negative for chest pain.  Gastrointestinal: Positive for nausea and vomiting. Negative for blood in stool.  Genitourinary: Negative for dysuria and frequency.  Musculoskeletal: Negative for falls and neck pain.  Skin: Positive for itching and rash.  Neurological: Negative for focal weakness and loss of consciousness.  Endo/Heme/Allergies: Negative for polydipsia. Does not bruise/bleed easily.  Psychiatric/Behavioral: The patient is nervous/anxious.     Past Medical History:  Diagnosis Date  . Anxiety   . Depression   . Emphysema lung ([place])   . GERD (gastroesophageal reflux disease)   . History of bronchitis   . History of kidney stones   . Hypothyroidism     Past Surgical History:  Procedure Laterality Date  . BREAST LUMPECTOMY  WITH RADIOACTIVE SEED LOCALIZATION Left 12/28/2017   Procedure: BREAST LUMPECTOMY WITH RADIOACTIVE SEED LOCALIZATION;  Surgeon: Jovita Kussmaul, MD;  Location: [facility];  Service: General;  Laterality: Left;  . DG THUMB RIGHT HAND ([place] HX)    .  KIDNEY STONE SURGERY       reports that she has been smoking. She has a 40.00 pack-year smoking history. She has never used smokeless tobacco. She reports current alcohol use. She reports that she does not use drugs.  Allergies  Allergen Reactions  . Codeine Nausea And Vomiting    History reviewed. No pertinent family history.  Prior to Admission medications   Medication Sig Start Date End Date Taking? Authorizing Provider  clonazePAM (KLONOPIN) 0.5 MG tablet Take 0.5 mg by mouth 2 (two) times daily as needed for anxiety.   Yes [provider]  levothyroxine (SYNTHROID) 75 MCG tablet Take 75 mcg by mouth daily. 09/07/18  Yes [provider]  PARoxetine (PAXIL) 40 MG tablet TAKE 1.5 TABLETS BY MOUTH DAILY AT BEDTIME Patient taking differently: Take 60 mg by mouth at bedtime.  09/08/18  Yes Cottle, Billey Co., MD  traZODone (DESYREL) 50 MG tablet Take 50-100 mg by mouth at bedtime.   Yes [provider]  buPROPion (WELLBUTRIN XL) 150 MG 24 hr tablet TAKE 1 TABLET BY MOUTH EVERY DAY IN THE MORNING FOR 1 WEEK, THEN 2 TABLETS DAILY THEREAFTER Patient not taking: Reported on 09/20/2018 07/10/18   Cottle, Billey Co., MD    Physical Exam:  Constitutional: Elderly female who appears to be in some discomfort Vitals:   09/20/18 0630 09/20/18 0730 09/20/18 0800 09/20/18 0830  BP: (!) 188/87 (!) 174/98 (!) 166/99 (!) 182/97  Pulse: 87 85 80 89  Resp: (!) 23 (!) 35 (!) 25 (!) 33  Temp:      TempSrc:      SpO2: 96% 97% 96% 100%   Eyes: PERRL, lids and conjunctivae normal ENMT: Mucous membranes are dry. Posterior pharynx clear of any exudate or lesions.  Neck: normal, supple, no masses, no thyromegaly Respiratory: Tachypneic with decreased breath sounds noted on the right mid and lower lung field with crackles.  No significant wheezes or rhonchi appreciated Cardiovascular: Regular rate and rhythm, no murmurs / rubs / gallops.  At least 1+ pitting bilateral extremity  edema. 2+ pedal pulses. No carotid bruits.  Abdomen: no tenderness, no masses palpated. No hepatosplenomegaly. Bowel sounds positive.  Musculoskeletal: no clubbing / cyanosis. No joint deformity upper and lower extremities. Good ROM, no contractures. Normal muscle tone.  Skin: Scaly-like rash at the lateral aspects of the feet. Neurologic: CN 2-12 grossly intact. Sensation intact, DTR normal. Strength 5/5 in all 4.  Psychiatric: Normal judgment and insight. Alert and oriented x 3.  Anxious mood.     Labs on Admission: I have personally reviewed following labs and imaging studies  CBC: Recent Labs  Lab 09/19/18 1858  WBC 4.1  HGB 9.0*  HCT 26.3*  MCV 96.0  PLT 295   Basic Metabolic Panel: Recent Labs  Lab 09/19/18 1858  NA 141  K 3.6  CL 110  CO2 19*  GLUCOSE 104*  BUN 31*  CREATININE 1.86*  CALCIUM 8.7*   GFR: CrCl cannot be calculated (Unknown ideal weight.). Liver Function Tests: Recent Labs  Lab 09/20/18 0334  AST 16  ALT 12  ALKPHOS 69  BILITOT 0.7  PROT 6.7  ALBUMIN 3.0*   No results for input(s): LIPASE, AMYLASE in the last  168 hours. No results for input(s): AMMONIA in the last 168 hours. Coagulation Profile: No results for input(s): INR, PROTIME in the last 168 hours. Cardiac Enzymes: No results for input(s): CKTOTAL, CKMB, CKMBINDEX, TROPONINI in the last 168 hours. BNP (last 3 results) No results for input(s): PROBNP in the last 8760 hours. HbA1C: No results for input(s): HGBA1C in the last 72 hours. CBG: No results for input(s): GLUCAP in the last 168 hours. Lipid Profile: No results for input(s): CHOL, HDL, LDLCALC, TRIG, CHOLHDL, LDLDIRECT in the last 72 hours. Thyroid Function Tests: No results for input(s): TSH, T4TOTAL, FREET4, T3FREE, THYROIDAB in the last 72 hours. Anemia Panel: Recent Labs    09/20/18 0334  VITAMINB12 595  FOLATE 4.3*  FERRITIN 288  TIBC 258  IRON 38  RETICCTPCT 2.4   Urine analysis: No results found for:  COLORURINE, APPEARANCEUR, LABSPEC, PHURINE, GLUCOSEU, HGBUR, BILIRUBINUR, KETONESUR, PROTEINUR, UROBILINOGEN, NITRITE, LEUKOCYTESUR Sepsis Labs: Recent Results (from the past 240 hour(s))  SARS Coronavirus 2 (CEPHEID - Performed in [facility] hospital lab), Hosp Order     Status: None   Collection Time: 09/20/18  2:55 AM  Result Value Ref Range Status   SARS Coronavirus 2 NEGATIVE NEGATIVE Final    Comment: (NOTE) If result is NEGATIVE SARS-CoV-2 target nucleic acids are NOT DETECTED. The SARS-CoV-2 RNA is generally detectable in upper and lower  respiratory specimens during the acute phase of infection. The lowest  concentration of SARS-CoV-2 viral copies this assay can detect is 250  copies / mL. A negative result does not preclude SARS-CoV-2 infection  and should not be used as the sole basis for treatment or other  patient management decisions.  A negative result may occur with  improper specimen collection / handling, submission of specimen other  than nasopharyngeal swab, presence of viral mutation(s) within the  areas targeted by this assay, and inadequate number of viral copies  (<250 copies / mL). A negative result must be combined with clinical  observations, patient history, and epidemiological information. If result is POSITIVE SARS-CoV-2 target nucleic acids are DETECTED. The SARS-CoV-2 RNA is generally detectable in upper and lower  respiratory specimens dur ing the acute phase of infection.  Positive  results are indicative of active infection with SARS-CoV-2.  Clinical  correlation with patient history and other diagnostic information is  necessary to determine patient infection status.  Positive results do  not rule out bacterial infection or co-infection with other viruses. If result is PRESUMPTIVE POSTIVE SARS-CoV-2 nucleic acids MAY BE PRESENT.   A presumptive positive result was obtained on the submitted specimen  and confirmed on repeat testing.  While 2019  novel coronavirus  (SARS-CoV-2) nucleic acids may be present in the submitted sample  additional confirmatory testing may be necessary for epidemiological  and / or clinical management purposes  to differentiate between  SARS-CoV-2 and other Sarbecovirus currently known to infect humans.  If clinically indicated additional testing with an alternate test  methodology [phone]) is advised. The SARS-CoV-2 RNA is generally  detectable in upper and lower respiratory sp ecimens during the acute  phase of infection. The expected result is Negative. Fact Sheet for Patients:  StrictlyIdeas.no Fact Sheet for Healthcare Providers: BankingDealers.co.za This test is not yet approved or cleared by the [place] FDA and has been authorized for detection and/or diagnosis of SARS-CoV-2 by FDA under an Emergency Use Authorization (EUA).  This EUA will remain in effect (meaning this test can be used) for the duration of the  COVID-19 declaration under Section 564(b)(1) of the Act, 21 U.S.C. section 360bbb-3(b)(1), unless the authorization is terminated or revoked sooner. Performed at [facility], [place] [address] [place], [place] [address]      Radiological Exams on Admission: Dg Chest 2 View  Result Date: 09/19/2018 CLINICAL DATA:  Shortness of breath, productive cough. EXAM: CHEST - 2 VIEW COMPARISON:  Radiographs of August 08, 2014. FINDINGS: The heart size and mediastinal contours are within normal limits. Left lung is clear. Right lower lobe atelectasis or infiltrate is noted with mild right pleural effusion. The visualized skeletal structures are unremarkable. IMPRESSION: Right lower lobe opacity is noted concerning for atelectasis or pneumonia with mild right pleural effusion. Electronically Signed   By: Marijo Conception M.D.   On: 09/19/2018 19:39   Ct Chest Wo Contrast  Result Date: 09/20/2018 CLINICAL DATA:  Shortness of breath and cough for  2 weeks EXAM: CT CHEST WITHOUT CONTRAST TECHNIQUE: Multidetector CT imaging of the chest was performed following the standard protocol without IV contrast. COMPARISON:  Chest x-ray from 2 days ago.  Abdominal CT 09/01/2018 FINDINGS: Cardiovascular: Normal heart size. Small pericardial effusion. Aortic and coronary atherosclerosis. Mediastinum/Nodes: Negative for adenopathy or mass. Lungs/Pleura: Hyperinflation with mild emphysema. There is a moderate right and small left layering pleural effusion with atelectasis or scarring. Upper Abdomen: Bilateral nephrolithiasis with a 11 mm stone at the right UPJ. No urinary obstruction where visualized. Musculoskeletal: No acute or aggressive finding IMPRESSION: 1. Pleural effusion that is moderate on the right and small on the left, increased from 09/01/2018 abdominal CT. 2. Small pericardial effusion which is new from prior. 3. Atherosclerosis and bilateral nephrolithiasis. 4. Mild emphysema. Electronically Signed   By: Monte Fantasia M.D.   On: 09/20/2018 04:19    EKG: Independently reviewed.  Sinus tachycardia at 101 bpm.  Assessment/Plan Pleural effusions, pericardial effusion dyspnea: Acute.  Patient presents with plaints of shortness of breath, cough, leg swelling, and orthopnea.  Chest x-ray concerning for right-sided pneumonia, CT scan of the chest noted moderate right sided pleural effusion.  Question heart failure vs. possible malignancy given history of tobacco use vs. uncontrolled hypothyroidism. -Admit to a telemetry bed  -Check BNP, TSH, CRP, ESR, pro calcitonin, and troponin -Check echocardiogram -Thoracentesis with fluid analysis  -May warrant cardiology consult  Nausea and vomiting: Acute.  Patient reports having decreased overall appetite along with nausea and vomiting symptoms.  Question the possibility of underlying infection such as UTI. -Antiemetics as needed  -Protonix IV every 12 hrs -Gentle IV fluids   Normocytic normochromic  anemia, folate deficiency: Patient previously noted to have blood counts within normal limits in 2019.  She presents with hemoglobin 9.  Stool guaiacs negative.  Anemia panel did reveal low folate level of 4.   -Check H&H and continue to monitor -Start folic acid replacement  Hypertensive urgency: Acute.  Patient not normally on blood pressure medication.  Blood pressures elevated up to 188/87 on admission. -Hydralazine IV as needed  Acute kidney injury: Baseline creatinine previously been within normal limits, but she presents with creatinine elevated to 1.86 with BUN 31.  CT scan showing large 11 mm stone at right UPJ junction without signs of obstruction.  Suspect likely prerenal given history of nausea and vomiting . -Check urinalysis -Check FeNa -Gentle IV fluids of normal saline at 75 mL/h -Recheck BMP  Hypothyroidism: Suspect previously uncontrolled as patient reports recent increase in dosage of levothyroxine from 50 mcg to 75 mcg a few  weeks ago..  -Check TSH -Continue levothyroxine  Nephrolithiasis: Previously noted to have a 9 mm stone on 5/15, and now 11 mm at the right UP junction.  Discussed with Dr. Diona Fanti who recommended outpatient follow-up and less and have continued pain or signs of infection. -Rediscussed with urology if any other acute findings otherwise scheduled for ambulatory referral.   Atherosclerosis: Aortic and coronary atherosclerosis as seen on CT scan chest. -Check lipid panel  Emphysema, without acute exacerbation: On physical exam no significant wheezes or rhonchi appreciated.  She is not on inhalers at home.  Previous pulmonary function testing and 08/2017 noted signs of obstruction with decreased diffusion, but no signs of hyperinflation.  CT scan of the chest notes mild emphysematous changes. -Albuterol nebs as needed shortness of breath/wheezing  Tinea pedis: Patient reports having a itchy scaly rash that lower extremity swelling started of the  bilateral feet.  Suspect fungal in nature. -Lotrimin cream  Anxiety/depression -Continue Paxil and Klonopin prn  Tobacco abuse: Reports smoking 3/4 pack of cigarettes per day on average with a history of smoking at least 55 years. -Counseled on need of cessation of tobacco use -Nicotine patch  DVT prophylaxis: SCD Code Status: Full Family Communication: Updated brother Disposition Plan: To be determined Consults called: Admission status: Observation  Norval Morton MD [facility] Pager [phone]   If 7PM-7AM, please contact night-coverage [email or web address] Password [facility]  09/20/2018, 9:23 AM

## 2018-09-20 NOTE — ED Notes (Signed)
Pt transported to IR for thoracentesis.  °

## 2018-09-20 NOTE — Procedures (Signed)
PROCEDURE SUMMARY:  Successful image-guided right thoracentesis. Yielded 900 milliliters of clear yellow fluid. Patient tolerated procedure well. No immediate complications. EBL = 0 mL.  Specimen was sent for labs. CXR ordered.  Alexandra Louk PA-C 09/20/2018 1:30 PM

## 2018-09-20 NOTE — Consult Note (Addendum)
Cardiology Consult    Patient ID: Tiffany Leon MRN: [id number], DOB/AGE: 07-25-1950   Admit date: 09/19/2018 Date of Consult: 09/20/2018  Primary Physician: Aretta Nip, MD Primary Cardiologist: Pixie Casino, MD - new Requesting Provider: Har[place], MD  Patient Profile    Tiffany Leon is a 68 y.o. female with a history of tob abuse, emphysema, GERD, depression, hypothyroidism, and anxiety, who is being seen today for the evaluation of dyspnea and CHF at the request of Dr. Tamala Julian.  Past Medical History   Past Medical History:  Diagnosis Date   Acute kidney injury ([place])    a. 09/2018   Anxiety    Aortic atherosclerosis ([place])    a. 09/2018 noted on Chest CT.   Chronic combined systolic (congestive) and diastolic (congestive) heart failure ([place])    a. 09/2018 Echo: EF 45-50%, diff HK. Triv to small circumfirential pericardial eff.   Chronic DOE (dyspnea on exertion)    Claudication ([place])    a. Bilat hip claudication since ~ 2019.   Depression    Emphysema lung ([place])    Exertional angina ([place])    a. Ex angina since ~ 2019.   GERD (gastroesophageal reflux disease)    Hiatal hernia    History of bronchitis    History of kidney stones    Hypothyroidism    Pleural effusion    a. 09/2018 s/p thoracentesis-->948m.   Resting tremor    Tobacco abuse     Past Surgical History:  Procedure Laterality Date   BREAST LUMPECTOMY WITH RADIOACTIVE SEED LOCALIZATION Left 12/28/2017   Procedure: BREAST LUMPECTOMY WITH RADIOACTIVE SEED LOCALIZATION;  Surgeon: TAutumn MessingIII, MD;  Location: [facility]  Service: General;  Laterality: Left;   DG THUMB RIGHT HAND ([facility] HX)     IR THORACENTESIS ASP PLEURAL SPACE W/IMG GUIDE  09/20/2018   KIDNEY STONE SURGERY       Allergies  Allergies  Allergen Reactions   Codeine Nausea And Vomiting    History of Present Illness    68y/o ? with a h/o of tobacco abuse, emphysema, GERD, depression, hypothyroidism, hiatal hernia, and  anxiety.  She has no known prior cardiac history.  She reports being followed closely by her primary care provider and only takes Synthroid, Paxil, trazodone, and Wellbutrin at home.  She lives locally with her husband and is retired.  She is not particularly active and notes some degree of chronic dyspnea on exertion.  Over the past year, she has also been experiencing exertional substernal chest tightness associated with dyspnea as well as bilateral hip claudication, which prevents her from walking greater than 50 yards.  In early March, she began to experience nausea, vomiting, and diarrhea.  This was occurring on a daily basis and she was seen by primary care provider.  She was told that her renal fxn was abnl and that she would require referral to nephrology.  She was also referred to gastroenterology and subsequently underwent abdominal ultrasound, which was nl.  This was followed by an abdominal CT which showed no acute findings.  Bilateral nephrolithiasis was noted with a 6 mm calculus in the right renal pelvis, without evidence for hydronephrosis.  Small pleural effusions and bibasilar atelectasis were also noted-right greater than left.  Unfortunately, her nausea and vomiting persisted while diarrhea ceased sometime in mid April.  In the setting of ongoing nausea and vomiting, she had significant fatigue.  Approximately 2 to 3 weeks ago, she also began to experience progressive  dyspnea on exertion with increasing bilateral lower extremity edema.  She thought that because she was not eating very much, that she should be losing weight however, she approximates that she gained about 15 pounds.  She does note that in the setting of poor appetite, nausea, and vomiting, she was eating more soup than usual.  About 1 week ago, she discontinued her home dose of Prilosec because she felt like it was making her nausea worse.  In that setting, she has been having some chest discomfort when lying in bed at night, which  she has been treating by increasing the number of pillows under her head.  Due to progressive fatigue and dyspnea on exertion, she presented to the emergency department on the evening of June 2.  There, COVID testing was negative.  She was hypertensive with a blood pressure 182/97.  Creatinine was found to be elevated at 1.86.  A CT scan of the chest was notable for moderate right-sided pleural effusion and a small left-sided pleural effusion which progressed from prior CT.  She had bilateral nephrolithiasis with an 11 mm stone at the right JVP junction.  Atherosclerosis of the aorta and coronary Ca2+ was also noted.  She was afebrile with a normal white blood cell count.  She was mildly anemic at 9.0 and 26.3.  Sedimentation rate was markedly elevated at 107.  She underwent thoracentesis on the right with 966m out.  She was admitted by medicine and subsequently underwent echo, which shows an EF of 4045%, diff HK, and a trivial to small circumferential pericardial effusion.  As a result, we have been consulted.  She currently denies c/p or dyspnea but cont to c/o fatigue.  Inpatient Medications     atorvastatin  40 mg Oral q1800   clotrimazole   Topical BID   folic acid  1 mg Oral Daily   levothyroxine  75 mcg Oral Daily   lidocaine       nicotine  21 mg Transdermal Daily   pantoprazole (PROTONIX) IV  40 mg Intravenous Q12H   PARoxetine  60 mg Oral QHS   sodium chloride flush  3 mL Intravenous Q12H   traZODone  50-100 mg Oral QHS    Family History    Family History  Problem Relation Age of Onset   Stroke Mother        Mini-strokes. Died @ 661   Dementia Mother    Lung cancer Father        Died in his 770's  Addison's disease Sister    Hypertension Brother    CAD Brother    Hypertension Brother    She indicated that her mother is deceased. She indicated that her father is deceased. She indicated that her sister is alive. She indicated that both of her brothers are  alive.   Social History    Social History   Socioeconomic History   Marital status: Married    Spouse name: Not on file   Number of children: Not on file   Years of education: Not on file   Highest education level: Not on file  Occupational History   Not on file  Social Needs   Financial resource strain: Not on file   Food insecurity:    Worry: Not on file    Inability: Not on file   Transportation needs:    Medical: Not on file    Non-medical: Not on file  Tobacco Use   Smoking status: Current Every Day Smoker  Packs/day: 0.75    Years: 40.00    Pack years: 30.00   Smokeless tobacco: Never Used   Tobacco comment: smoking 15 cigarettes/day  Substance and Sexual Activity   Alcohol use: Yes    Comment: 1-2 gl wine / night - none since 07/2018   Drug use: Not Currently    Comment: prev used drugs ~ 35 yrs ago.   Sexual activity: Not on file  Lifestyle   Physical activity:    Days per week: Not on file    Minutes per session: Not on file   Stress: Not on file  Relationships   Social connections:    Talks on phone: Not on file    Gets together: Not on file    Attends religious service: Not on file    Active member of club or organization: Not on file    Attends meetings of clubs or organizations: Not on file    Relationship status: Not on file   Intimate partner violence:    Fear of current or ex partner: Not on file    Emotionally abused: Not on file    Physically abused: Not on file    Forced sexual activity: Not on file  Other Topics Concern   Not on file  Social History Narrative   Lives in [place] with her husband.  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     Review of Systems    General:  No chills, fever, night sweats.  +++weight gain of ~ 15 lbs.  Cardiovascular:  +++ ex chest pain, +++ dyspnea on exertion, +++ bilat LE edema, no orthopnea, no palpitations, paroxysmal nocturnal dyspnea. Dermatological: No rash,  lesions/masses Respiratory: +++ cough, +++ dyspnea Urologic: No hematuria, dysuria Abdominal:   +++ nausea, vomiting, diarrhea, no bright red blood per rectum, melena, or hematemesis Neurologic:  No visual changes, wkns, changes in mental status.  +++ tremor. All other systems reviewed and are otherwise negative except as noted above.  Physical Exam    Blood pressure (!) 159/103, pulse 95, temperature 98 F (36.7 C), temperature source Oral, resp. rate 20, height 5' 6"  (1.676 m), weight 66.1 kg, SpO2 98 %.  General: Pleasant, NAD Psych: Normal affect. Neuro: Alert and oriented X 3. Moves all extremities spontaneously. HEENT: Normal  Neck: Supple without bruits.  JVP to jaw.   Lungs:  Resp regular and unlabored, diminished breath sounds @ bilat bases w/ bibasilar crackles. Heart: RRR no s3, s4, or murmurs. Abdomen: Soft, non-tender, non-distended, BS + x 4.  Extremities: No clubbing, cyanosis.  2+ bilat LE edema. DP/PT/Radials 2+ and equal bilaterally.  Labs    Troponin  Recent Labs    09/20/18 0348  TROPIPOC 0.06   Recent Labs    09/20/18 1559  TROPONINI 0.05*   Lab Results  Component Value Date   WBC 4.1 09/19/2018   HGB 8.3 (L) 09/20/2018   HCT 25.2 (L) 09/20/2018   MCV 96.0 09/19/2018   PLT 199 09/19/2018    Recent Labs  Lab 09/20/18 0334 09/20/18 1200  NA  --  141  K  --  3.7  CL  --  112*  CO2  --  21*  BUN  --  32*  CREATININE  --  1.70*  CALCIUM  --  8.5*  PROT 6.7  --   BILITOT 0.7  --   ALKPHOS 69  --   ALT 12  --   AST 16  --   GLUCOSE  --  91   Lab Results  Component Value Date   CHOL 186 09/20/2018   HDL 31 (L) 09/20/2018   LDLCALC 131 (H) 09/20/2018   TRIG 120 09/20/2018     Radiology Studies    Ct Abdomen Pelvis Wo Contrast  Result Date: 09/01/2018 CLINICAL DATA:  Nausea and vomiting. Weight loss. Intermittent diarrhea for 8 weeks. Personal history of breast carcinoma. EXAM: CT ABDOMEN AND PELVIS WITHOUT CONTRAST TECHNIQUE:  Multidetector CT imaging of the abdomen and pelvis was performed following the standard protocol without IV contrast. COMPARISON:  None. FINDINGS: Lower chest: Small right pleural effusion and tiny left pleural few are seen. Mild bibasilar atelectasis or scarring, right side greater than left. Hepatobiliary: No mass visualized on this unenhanced exam. Gallbladder is unremarkable. Pancreas: No mass or inflammatory process visualized on this unenhanced exam. Spleen:  Within normal limits in size. Adrenals/Urinary tract: Small renal calculi are seen bilaterally. A 6 mm calculus is seen in the right renal pelvis, however there is no evidence of hydronephrosis. No evidence of ureteral calculi or dilatation. Stomach/Bowel: No evidence of obstruction, inflammatory process, or abnormal fluid collections. Normal appendix visualized. Vascular/Lymphatic: No pathologically enlarged lymph nodes identified. No evidence of abdominal aortic aneurysm. Aortic atherosclerosis. Reproductive:  No mass or other significant abnormality. Other:  None. Musculoskeletal:  No suspicious bone lesions identified. IMPRESSION: 1. No acute findings within the abdomen or pelvis. 2. Bilateral nephrolithiasis. 6 mm calculus in right renal pelvis, without evidence of hydronephrosis. 3. Small pleural effusions and bibasilar atelectasis versus scarring, right side greater than left. Aortic Atherosclerosis (ICD10-I70.0). Electronically Signed   By: Earle Gell M.D.   On: 09/01/2018 12:40   Dg Chest 1 View  Result Date: 09/20/2018 CLINICAL DATA:  Initial evaluation for pleural effusion. Status post right thoracentesis. EXAM: CHEST  1 VIEW COMPARISON:  Prior CT from earlier the same day. FINDINGS: Cardiac and mediastinal silhouettes are stable in size and contour, and remain within normal limits. Aortic atherosclerosis. Lungs well inflated. No pneumothorax seen status post thoracentesis. Small residual right-sided pleural effusion, decreased from  previous. Small amount of trapped fluid noted along the right minor fissure. Improved aeration at the right lung base. No new focal airspace disease. No pulmonary edema. No acute osseous finding. IMPRESSION: 1. Small residual right-sided pleural effusion status post thoracentesis, decreased from previous. No pneumothorax. 2. Improved aeration at the right lung base. 3. Aortic atherosclerosis. Electronically Signed   By: Jeannine Boga M.D.   On: 09/20/2018 13:45   Dg Chest 2 View  Result Date: 09/19/2018 CLINICAL DATA:  Shortness of breath, productive cough. EXAM: CHEST - 2 VIEW COMPARISON:  Radiographs of August 08, 2014. FINDINGS: The heart size and mediastinal contours are within normal limits. Left lung is clear. Right lower lobe atelectasis or infiltrate is noted with mild right pleural effusion. The visualized skeletal structures are unremarkable. IMPRESSION: Right lower lobe opacity is noted concerning for atelectasis or pneumonia with mild right pleural effusion. Electronically Signed   By: Marijo Conception M.D.   On: 09/19/2018 19:39   Ct Chest Wo Contrast  Result Date: 09/20/2018 CLINICAL DATA:  Shortness of breath and cough for 2 weeks EXAM: CT CHEST WITHOUT CONTRAST TECHNIQUE: Multidetector CT imaging of the chest was performed following the standard protocol without IV contrast. COMPARISON:  Chest x-ray from 2 days ago.  Abdominal CT 09/01/2018 FINDINGS: Cardiovascular: Normal heart size. Small pericardial effusion. Aortic and coronary atherosclerosis. Mediastinum/Nodes: Negative for adenopathy or mass. Lungs/Pleura: Hyperinflation with mild emphysema. There  is a moderate right and small left layering pleural effusion with atelectasis or scarring. Upper Abdomen: Bilateral nephrolithiasis with a 11 mm stone at the right UPJ. No urinary obstruction where visualized. Musculoskeletal: No acute or aggressive finding IMPRESSION: 1. Pleural effusion that is moderate on the right and small on the  left, increased from 09/01/2018 abdominal CT. 2. Small pericardial effusion which is new from prior. 3. Atherosclerosis and bilateral nephrolithiasis. 4. Mild emphysema. Electronically Signed   By: Monte Fantasia M.D.   On: 09/20/2018 04:19   Ir Thoracentesis Asp Pleural Space W/img Guide  Result Date: 09/20/2018 INDICATION: Patient with history of dyspnea, pericardial effusion, and right pleural effusion. Request is made for diagnostic and therapeutic right thoracentesis. EXAM: ULTRASOUND GUIDED DIAGNOSTIC AND THERAPEUTIC RIGHT THORACENTESIS MEDICATIONS: 10 mL 1% lidocaine COMPLICATIONS: None immediate. PROCEDURE: An ultrasound guided thoracentesis was thoroughly discussed with the patient and questions answered. The benefits, risks, alternatives and complications were also discussed. The patient understands and wishes to proceed with the procedure. Written consent was obtained. Ultrasound was performed to localize and mark an adequate pocket of fluid in the right chest. The area was then prepped and draped in the normal sterile fashion. 1% Lidocaine was used for local anesthesia. Under ultrasound guidance a 6 Fr Safe-T-Centesis catheter was introduced. Thoracentesis was performed. The catheter was removed and a dressing applied. FINDINGS: A total of approximately 900 mL of clear yellow fluid was removed. Samples were sent to the laboratory as requested by the clinical team. IMPRESSION: Successful ultrasound guided right thoracentesis yielding 900 mL of pleural fluid. Read by: Earley Abide, PA-C Electronically Signed   By: Jerilynn Mages.  Shick M.D.   On: 09/20/2018 13:50    ECG & Cardiac Imaging    Sinus tachycardia, 101, septal infarct - personally reviewed.  Assessment & Plan    1.  Acute combined systolic and diastolic CHF:  Pt w/o prior cardiac history, who presents with a 89-monthhistory of nausea, vomiting, and diarrhea followed by a 2 to 4-week history of progressive dyspnea and lower extremity edema.   She also reports a one-year history of exertional chest tightness and dyspnea on exertion as well as claudication.  She was found to have a large right pleural effusion that required thoracentesis and echocardiogram also shows moderate LV dysfunction with an EF of 40 to 45%.  She is volume overloaded on examination with significant JVD and lower extremity edema.  Creatinine was 1.86 on arrival and is 1.70 this morning.  We will initiate Lasix 40 mg IV twice daily as well as carvedilol therapy.  Pending blood pressure response, we will likely look to add hydralazine/nitrate tomorrow.  In the setting of presumed acute on chronic kidney injury, will hold off on ACE/ARB/ARNI/MRA at this time.  Ultimately, she will require an ischemic evaluation however we would defer at this point in the setting of volume overload and acute renal insufficiency.  2.  Exertional chest pain/mild troponin elevation: Patient reports a one-year history of exertional chest tightness and dyspnea.  Troponin this admission was 0.06-0.05.  Suspect demand ischemia in the setting of volume overload.  ECG with prior septal infarct but no old for comparison.  Chest CT suggestive of significant coronary calcium and aortic atherosclerosis.  High suspicion for coronary artery disease.  As noted above, in the setting of LV dysfunction with EF of 40 to 45%, she will require an ischemic evaluation pending recovery from respiratory failure/CHF and renal function.  Continue statin therapy and add low-dose aspirin  and beta-blocker.  3.  Essential hypertension: Patient denies prior history of hypertension despite good primary care follow-up.  She does note that her blood pressure on last evaluation in the outpatient setting was in the 150s.  She presented with pressures over the 180s and is currently at 159/103.  Adding diuresis and beta-blocker as outlined above.  Will follow and adjust medications as needed with potential addition of hydralazine/nitrate  tomorrow.  4.  Right pleural effusion: Status post thoracentesis of 900 mL's.  Cytology pending.  This finding all the more concerning in the setting of her history of tobacco abuse and significant elevation of sedimentation rate.  5.  Hyperlipidemia: LDL 131.  Statin therapy was added this admission.  Normal LFTs.  6.  Probable acute on chronic kidney injury: Creatinine 1.86 on admission.  She says that she was told sometime in March that her kidney function was mildly abnormal.  Creatinine slightly better this morning at 1.70.  Follow with diuresis.  She previously had outpatient referral to nephrology however has yet to see them.  7.  Bilateral hip claudication: Patient reports a one-year history of bilateral hip pain with ambulation.  She says she has significant burning in the muscles of her hips and cannot even walk 50 yards.  She has never had lower extremity evaluation ordered aortoiliac duplex.  We will need to look into this in the outpatient setting.  Adding aspirin.  Continue statin.  8.  Normocytic anemia: H&H down slightly to 8.3 and 25.2.  She denies any hematuria, melena, or bright red blood per rectum.  She says she was told in March that she was anemic.  She is previous been evaluated by Dr. Michail Sermon from GI.  9.  Elevated ESR: Follow-up pleural effusion labs.  Imaging up to this point without evidence for malignancy.  White count normal.  Afebrile.  10.  Hypothyroidism: TSH 5.145.  She is on levothyroxine and this is being managed by primary care/internal medicine.  11. Tob abuse:  Cessation advised.  12.  Nausea/vomiting/diarrhea/anorexia:  Ss began in March.  N/V/anorexia have persisted, however diarrhea resolved in April.  Abd u/s unremarkable.  CT abd only notable for nephrolithiasis.  No significant back/flank pain.  Per IM.  ? Role of CHF/volume overload.  13.  Nephrolithiasis:  No pain.  Plan for outpt urol f/u.  Signed, Murray Hodgkins, NP 09/20/2018, 6:32 PM  For  questions or updates, please contact   Please consult [email or web address] for contact info under Cardiology/STEMI.

## 2018-09-20 NOTE — Progress Notes (Signed)
  Echocardiogram 2D Echocardiogram has been performed.  Tiffany Leon 09/20/2018, 2:54 PM

## 2018-09-20 NOTE — Progress Notes (Signed)
Critical Troponin 0.05. Communication sent to MD Hal Hope and attempt to call. Flow management called and informed updating MD to [facility]. Page sent to [facility] Plan MD.

## 2018-09-20 NOTE — ED Notes (Signed)
This RN acting as Art therapist and asked pt if she would like for me to contact any family/friends. Pt declined and states she is able to keep her family informed.

## 2018-09-20 NOTE — CV Procedure (Signed)
2D echo attempted but patient in route to IR. Will try later

## 2018-09-20 NOTE — ED Notes (Signed)
ED TO INPATIENT HANDOFF REPORT  ED Nurse Name and Phone #: Jinny Blossom [phone]  S Name/Age/Gender Tiffany Leon 68 y.o. female Room/Bed: 026C/026C  Code Status   Code Status: Full Code  Home/SNF/Other Home Patient oriented to: self, place, time and situation Is this baseline? Yes   Triage Complete: Triage complete  Chief Complaint Weak, vomiting, SHOB  Triage Note Pt endorses n/v/d x 2 months and chest pain that is worse at night. VSS. Afebrile. Pt tested [facility] today due to cough x 2 weeks ago, does not have results.    Allergies Allergies  Allergen Reactions  . Codeine Nausea And Vomiting    Level of Care/Admitting Diagnosis ED Disposition    ED Disposition Condition Comment   Admit  Hospital Area: [facility] [[id number]]  Level of Care: Telemetry Cardiac [103]  I expect the patient will be discharged within 24 hours: No (not a candidate for 5C-Observation unit)  Covid Evaluation: Screening Protocol (No Symptoms)  Diagnosis: SOB (shortness of breath) [267124]  Admitting Physician: Rise Patience [phone]  Attending Physician: Rise Patience [email or web address]  PT Class (Do Not Modify): Observation [104]  PT Acc Code (Do Not Modify): Observation [10022]       B Medical/Surgery History Past Medical History:  Diagnosis Date  . Anxiety   . Depression   . Emphysema lung ([place])   . GERD (gastroesophageal reflux disease)   . History of bronchitis   . History of kidney stones   . Hypothyroidism    Past Surgical History:  Procedure Laterality Date  . BREAST LUMPECTOMY WITH RADIOACTIVE SEED LOCALIZATION Left 12/28/2017   Procedure: BREAST LUMPECTOMY WITH RADIOACTIVE SEED LOCALIZATION;  Surgeon: Jovita Kussmaul, MD;  Location: [facility];  Service: General;  Laterality: Left;  . DG THUMB RIGHT HAND ([place] HX)    . IR THORACENTESIS ASP PLEURAL SPACE W/IMG GUIDE  09/20/2018  . KIDNEY STONE SURGERY       A IV Location/Drains/Wounds Patient  Lines/Drains/Airways Status   Active Line/Drains/Airways    Name:   Placement date:   Placement time:   Site:   Days:   Peripheral IV 09/20/18 Right Antecubital   09/20/18    0342    Antecubital   less than 1   Incision (Closed) 12/28/17 Breast Left   12/28/17    0935     266          Intake/Output Last 24 hours No intake or output data in the 24 hours ending 09/20/18 1512  Labs/Imaging Results for orders placed or performed during the hospital encounter of 09/19/18 (from the past 48 hour(s))  Basic metabolic panel     Status: Abnormal   Collection Time: 09/19/18  6:58 PM  Result Value Ref [place]   Sodium 141 135 - 145 mmol/L   Potassium 3.6 3.5 - 5.1 mmol/L   Chloride 110 98 - 111 mmol/L   CO2 19 (L) 22 - 32 mmol/L   Glucose, Bld 104 (H) 70 - 99 mg/dL   BUN 31 (H) 8 - 23 mg/dL   Creatinine, Ser 1.86 (H) 0.44 - 1.00 mg/dL   Calcium 8.7 (L) 8.9 - 10.3 mg/dL   GFR calc non Af Amer 27 (L) >60 mL/min   GFR calc Af Amer 31 (L) >60 mL/min   Anion gap 12 5 - 15    Comment: Performed at [facility], [address], [place], [place] [address]  CBC     Status: Abnormal  Collection Time: 09/19/18  6:58 PM  Result Value Ref [place]   WBC 4.1 4.0 - 10.5 K/uL   RBC 2.74 (L) 3.87 - 5.11 MIL/uL   Hemoglobin 9.0 (L) 12.0 - 15.0 g/dL   HCT 26.3 (L) 36.0 - 46.0 %   MCV 96.0 80.0 - 100.0 fL   MCH 32.8 26.0 - 34.0 pg   MCHC 34.2 30.0 - 36.0 g/dL   RDW 13.2 11.5 - 15.5 %   Platelets 199 150 - 400 K/uL   nRBC 0.0 0.0 - 0.2 %    Comment: Performed at [facility], [place] [address] [place], [place] [address]  I-Stat Troponin, ED (not at [facility])     Status: None   Collection Time: 09/19/18  7:06 PM  Result Value Ref Range   Troponin i, poc 0.07 0.00 - 0.08 ng/mL   Comment 3            Comment: Due to the release kinetics of cTnI, a negative result within the first hours of the onset of symptoms does not rule out myocardial infarction with certainty. If myocardial infarction is still  suspected, repeat the test at appropriate intervals.   SARS Coronavirus 2 (CEPHEID - Performed in [facility] hospital lab), Hosp Order     Status: None   Collection Time: 09/20/18  2:55 AM  Result Value Ref [place]   SARS Coronavirus 2 NEGATIVE NEGATIVE    Comment: (NOTE) If result is NEGATIVE SARS-CoV-2 target nucleic acids are NOT DETECTED. The SARS-CoV-2 RNA is generally detectable in upper and lower  respiratory specimens during the acute phase of infection. The lowest  concentration of SARS-CoV-2 viral copies this assay can detect is 250  copies / mL. A negative result does not preclude SARS-CoV-2 infection  and should not be used as the sole basis for treatment or other  patient management decisions.  A negative result may occur with  improper specimen collection / handling, submission of specimen other  than nasopharyngeal swab, presence of viral mutation(s) within the  areas targeted by this assay, and inadequate number of viral copies  (<250 copies / mL). A negative result must be combined with clinical  observations, patient history, and epidemiological information. If result is POSITIVE SARS-CoV-2 target nucleic acids are DETECTED. The SARS-CoV-2 RNA is generally detectable in upper and lower  respiratory specimens dur ing the acute phase of infection.  Positive  results are indicative of active infection with SARS-CoV-2.  Clinical  correlation with patient history and other diagnostic information is  necessary to determine patient infection status.  Positive results do  not rule out bacterial infection or co-infection with other viruses. If result is PRESUMPTIVE POSTIVE SARS-CoV-2 nucleic acids MAY BE PRESENT.   A presumptive positive result was obtained on the submitted specimen  and confirmed on repeat testing.  While 2019 novel coronavirus  (SARS-CoV-2) nucleic acids may be present in the submitted sample  additional confirmatory testing may be necessary for  epidemiological  and / or clinical management purposes  to differentiate between  SARS-CoV-2 and other Sarbecovirus currently known to infect humans.  If clinically indicated additional testing with an alternate test  methodology [phone]) is advised. The SARS-CoV-2 RNA is generally  detectable in upper and lower respiratory sp ecimens during the acute  phase of infection. The expected result is Negative. Fact Sheet for Patients:  StrictlyIdeas.no Fact Sheet for Healthcare Providers: BankingDealers.co.za This test is not yet approved or cleared by the [place] FDA and  has been authorized for detection and/or diagnosis of SARS-CoV-2 by FDA under an Emergency Use Authorization (EUA).  This EUA will remain in effect (meaning this test can be used) for the duration of the COVID-19 declaration under Section 564(b)(1) of the Act, 21 U.S.C. section 360bbb-3(b)(1), unless the authorization is terminated or revoked sooner. Performed at [facility], [place] [address], [place], [place] [address]   Hepatic function panel     Status: Abnormal   Collection Time: 09/20/18  3:34 AM  Result Value Ref [place]   Total Protein 6.7 6.5 - 8.1 g/dL   Albumin 3.0 (L) 3.5 - 5.0 g/dL   AST 16 15 - 41 U/L   ALT 12 0 - 44 U/L   Alkaline Phosphatase 69 38 - 126 U/L   Total Bilirubin 0.7 0.3 - 1.2 mg/dL   Bilirubin, Direct <0.1 0.0 - 0.2 mg/dL   Indirect Bilirubin NOT CALCULATED 0.3 - 0.9 mg/dL    Comment: Performed at [facility] [address], [place], [place] [address]  POC occult blood, ED     Status: None   Collection Time: 09/20/18  3:34 AM  Result Value Ref [place]   Fecal Occult Bld NEGATIVE NEGATIVE  Vitamin B12     Status: None   Collection Time: 09/20/18  3:34 AM  Result Value Ref [place]   Vitamin B-12 595 180 - 914 pg/mL    Comment: (NOTE) This assay is not validated for testing neonatal or myeloproliferative syndrome specimens for  Vitamin B12 levels. Performed at [facility], [place] [address], [place], [place] [address]   Folate     Status: Abnormal   Collection Time: 09/20/18  3:34 AM  Result Value Ref [place]   Folate 4.3 (L) >5.9 ng/mL    Comment: Performed at [facility], [place] [address], [place], [place] [address]  Iron and TIBC     Status: None   Collection Time: 09/20/18  3:34 AM  Result Value Ref [place]   Iron 38 28 - 170 ug/dL   TIBC 258 250 - 450 ug/dL   Saturation Ratios 15 10.4 - 31.8 %   UIBC 220 ug/dL    Comment: Performed at [facility], [place] [address], [place], [place] [address]  Ferritin     Status: None   Collection Time: 09/20/18  3:34 AM  Result Value Ref [place]   Ferritin 288 11 - 307 ng/mL    Comment: Performed at [facility], [place] [address], [place], [place] [address]  Reticulocytes     Status: Abnormal   Collection Time: 09/20/18  3:34 AM  Result Value Ref [place]   Retic Ct Pct 2.4 0.4 - 3.1 %   RBC. 2.83 (L) 3.87 - 5.11 MIL/uL   Retic Count, Absolute 66.8 19.0 - 186.0 K/uL   Immature Retic Fract 9.0 2.3 - 15.9 %    Comment: Performed at [facility] [address], [place], [place] [address]  Type and screen [place]     Status: None   Collection Time: 09/20/18  3:34 AM  Result Value Ref [place]   ABO/RH(D) O POS    Antibody Screen NEG    Sample Expiration      09/23/2018,2359 Performed at [facility], [place] [address], [place],  [address]   ABO/Rh     Status: None   Collection Time: 09/20/18  3:34 AM  Result Value Ref [place]  ABO/RH(D)      O POS Performed at [facility], [place] [address] [place], [place] [address]   I-stat troponin, ED     Status: None   Collection Time: 09/20/18  3:48 AM  Result Value Ref [place]   Troponin i, poc 0.06 0.00 - 0.08 ng/mL   Comment 3            Comment: Due to the release kinetics of cTnI, a negative result within the first hours of the onset of symptoms does not rule  out myocardial infarction with certainty. If myocardial infarction is still suspected, repeat the test at appropriate intervals.   Procalcitonin - Baseline     Status: None   Collection Time: 09/20/18 10:10 AM  Result Value Ref [place]   Procalcitonin <0.10 ng/mL    Comment:        Interpretation: PCT (Procalcitonin) <= 0.5 ng/mL: Systemic infection (sepsis) is not likely. Local bacterial infection is possible. (NOTE)       Sepsis PCT Algorithm           Lower Respiratory Tract                                      Infection PCT Algorithm    ----------------------------     ----------------------------         PCT < 0.25 ng/mL                PCT < 0.10 ng/mL         Strongly encourage             Strongly discourage   discontinuation of antibiotics    initiation of antibiotics    ----------------------------     -----------------------------       PCT 0.25 - 0.50 ng/mL            PCT 0.10 - 0.25 ng/mL               OR       >80% decrease in PCT            Discourage initiation of                                            antibiotics      Encourage discontinuation           of antibiotics    ----------------------------     -----------------------------         PCT >= 0.50 ng/mL              PCT 0.26 - 0.50 ng/mL               AND        <80% decrease in PCT             Encourage initiation of                                             antibiotics       Encourage continuation           of antibiotics    ----------------------------     -----------------------------  PCT >= 0.50 ng/mL                  PCT > 0.50 ng/mL               AND         increase in PCT                  Strongly encourage                                      initiation of antibiotics    Strongly encourage escalation           of antibiotics                                     -----------------------------                                           PCT <= 0.25 ng/mL                                                  OR                                        > 80% decrease in PCT                                     Discontinue / Do not initiate                                             antibiotics Performed at [facility], [address], [place], [place] [address]   Hemoglobin and hematocrit, blood     Status: Abnormal   Collection Time: 09/20/18 10:10 AM  Result Value Ref [place]   Hemoglobin 8.3 (L) 12.0 - 15.0 g/dL   HCT 25.2 (L) 36.0 - 46.0 %    Comment: Performed at [facility], [place] [address], [place], [place] [address]  Sedimentation rate     Status: Abnormal   Collection Time: 09/20/18 10:10 AM  Result Value Ref [place]   Sed Rate 107 (H) 0 - 22 mm/hr    Comment: Performed at [facility] [address] [place], [place] [address]  C-reactive protein     Status: Abnormal   Collection Time: 09/20/18 10:10 AM  Result Value Ref [place]   CRP 2.9 (H) <1.0 mg/dL    Comment: Performed at [facility] [address], [place], [place] [address]  Brain natriuretic peptide     Status: Abnormal   Collection Time: 09/20/18 12:00 PM  Result Value Ref [place]   B Natriuretic Peptide 608.1 (H) 0.0 - 100.0 pg/mL    Comment: Performed at [facility], [address], [place], [place] [address]  TSH     Status: Abnormal   Collection Time: 09/20/18 12:00 PM  Result Value Ref [place]   TSH 5.145 (H) 0.350 - 4.500 uIU/mL    Comment: Performed by a 3rd Generation assay with a functional sensitivity of <=0.01 uIU/mL. Performed at [facility], [place] [address], [place], [place] [address]   Basic metabolic panel     Status: Abnormal   Collection Time: 09/20/18 12:00 PM  Result Value Ref [place]   Sodium 141 135 - 145 mmol/L   Potassium 3.7 3.5 - 5.1 mmol/L   Chloride 112 (H) 98 - 111 mmol/L   CO2 21 (L) 22 - 32 mmol/L   Glucose, Bld 91 70 - 99 mg/dL   BUN 32 (H) 8 - 23 mg/dL   Creatinine, Ser 1.70 (H) 0.44 - 1.00 mg/dL   Calcium 8.5 (L) 8.9 - 10.3 mg/dL    GFR calc non Af Amer 30 (L) >60 mL/min   GFR calc Af Amer 35 (L) >60 mL/min   Anion gap 8 5 - 15    Comment: Performed at [facility] [address], [place], [place] [address]  Lipid panel     Status: Abnormal   Collection Time: 09/20/18 12:00 PM  Result Value Ref [place]   Cholesterol 186 0 - 200 mg/dL   Triglycerides 120 <150 mg/dL   HDL 31 (L) >40 mg/dL   Total CHOL/HDL Ratio 6.0 RATIO   VLDL 24 0 - 40 mg/dL   LDL Cholesterol 131 (H) 0 - 99 mg/dL    Comment:        Total Cholesterol/HDL:CHD Risk Coronary Heart Disease Risk Table                     Men   Women  1/2 Average Risk   3.4   3.3  Average Risk       5.0   4.4  2 X Average Risk   9.6   7.1  3 X Average Risk  23.4   11.0        Use the calculated Patient Ratio above and the CHD Risk Table to determine the patient's CHD Risk.        ATP III CLASSIFICATION (LDL):  <100     mg/dL   Optimal  100-129  mg/dL   Near or Above                    Optimal  130-159  mg/dL   Borderline  160-189  mg/dL   High  >190     mg/dL   Very High Performed at [facility] [address], [place], [place] [address]   Body fluid cell count with differential     Status: Abnormal   Collection Time: 09/20/18  1:23 PM  Result Value Ref [place]   Fluid Type-FCT Pleural R    Color, Fluid STRAW YELLOW   Appearance, Fluid HAZY (A) CLEAR   WBC, Fluid 313 0 - 1,000 cu mm   Neutrophil Count, Fluid 1 0 - 25 %   Lymphs, Fluid 73 %   Monocyte-Macrophage-Serous Fluid 26 (L) 50 - 90 %   Eos, Fluid 0 %   Other Cells, Fluid MESOTHELIAL CELLS PRESENT %    Comment: Performed at [facility], [place] [address], [place], [place] [address]   Dg Chest 1 View  Result Date: 09/20/2018 CLINICAL DATA:  Initial evaluation for pleural effusion. Status post right thoracentesis. EXAM: CHEST  1 VIEW COMPARISON:  Prior CT from earlier the same day. FINDINGS: Cardiac and mediastinal silhouettes are stable in size and contour, and remain within normal  limits. Aortic atherosclerosis. Lungs well inflated. No pneumothorax seen status post thoracentesis. Small residual right-sided pleural effusion, decreased from previous. Small amount of trapped fluid noted along the right minor fissure. Improved aeration at the right lung base. No new focal airspace disease. No pulmonary edema. No acute osseous finding. IMPRESSION: 1. Small residual right-sided pleural effusion status post thoracentesis, decreased from previous. No pneumothorax. 2. Improved aeration at the right lung base. 3. Aortic atherosclerosis. Electronically Signed   By: Jeannine Boga M.D.   On: 09/20/2018 13:45   Dg Chest 2 View  Result Date: 09/19/2018 CLINICAL DATA:  Shortness of breath, productive cough. EXAM: CHEST - 2 VIEW COMPARISON:  Radiographs of August 08, 2014. FINDINGS: The heart size and mediastinal contours are within normal limits. Left lung is clear. Right lower lobe atelectasis or infiltrate is noted with mild right pleural effusion. The visualized skeletal structures are unremarkable. IMPRESSION: Right lower lobe opacity is noted concerning for atelectasis or pneumonia with mild right pleural effusion. Electronically Signed   By: Marijo Conception M.D.   On: 09/19/2018 19:39   Ct Chest Wo Contrast  Result Date: 09/20/2018 CLINICAL DATA:  Shortness of breath and cough for 2 weeks EXAM: CT CHEST WITHOUT CONTRAST TECHNIQUE: Multidetector CT imaging of the chest was performed following the standard protocol without IV contrast. COMPARISON:  Chest x-ray from 2 days ago.  Abdominal CT 09/01/2018 FINDINGS: Cardiovascular: Normal heart size. Small pericardial effusion. Aortic and coronary atherosclerosis. Mediastinum/Nodes: Negative for adenopathy or mass. Lungs/Pleura: Hyperinflation with mild emphysema. There is a moderate right and small left layering pleural effusion with atelectasis or scarring. Upper Abdomen: Bilateral nephrolithiasis with a 11 mm stone at the right UPJ. No urinary  obstruction where visualized. Musculoskeletal: No acute or aggressive finding IMPRESSION: 1. Pleural effusion that is moderate on the right and small on the left, increased from 09/01/2018 abdominal CT. 2. Small pericardial effusion which is new from prior. 3. Atherosclerosis and bilateral nephrolithiasis. 4. Mild emphysema. Electronically Signed   By: Monte Fantasia M.D.   On: 09/20/2018 04:19   Ir Thoracentesis Asp Pleural Space W/img Guide  Result Date: 09/20/2018 INDICATION: Patient with history of dyspnea, pericardial effusion, and right pleural effusion. Request is made for diagnostic and therapeutic right thoracentesis. EXAM: ULTRASOUND GUIDED DIAGNOSTIC AND THERAPEUTIC RIGHT THORACENTESIS MEDICATIONS: 10 mL 1% lidocaine COMPLICATIONS: None immediate. PROCEDURE: An ultrasound guided thoracentesis was thoroughly discussed with the patient and questions answered. The benefits, risks, alternatives and complications were also discussed. The patient understands and wishes to proceed with the procedure. Written consent was obtained. Ultrasound was performed to localize and mark an adequate pocket of fluid in the right chest. The area was then prepped and draped in the normal sterile fashion. 1% Lidocaine was used for local anesthesia. Under ultrasound guidance a 6 Fr Safe-T-Centesis catheter was introduced. Thoracentesis was performed. The catheter was removed and a dressing applied. FINDINGS: A total of approximately 900 mL of clear yellow fluid was removed. Samples were sent to the laboratory as requested by the clinical team. IMPRESSION: Successful ultrasound guided right thoracentesis yielding 900 mL of pleural fluid. Read by: Earley Abide, PA-C Electronically Signed   By: Jerilynn Mages.  Shick M.D.   On: 09/20/2018 13:50    Pending Labs [employer] (From  admission, onward)    Start     Ordered   09/21/18 0500  CBC  Tomorrow morning,   R     09/20/18 1011   09/21/18 2836  Basic metabolic panel  Tomorrow  morning,   R     09/20/18 1011   09/21/18 0500  Creatinine, urine, random  Tomorrow morning,   R     09/20/18 1011   09/20/18 1611  Troponin I - Now Then Q6H  Now then every 6 hours,   R     09/20/18 1025   09/20/18 1353  Lactate dehydrogenase  Add-on,   R     09/20/18 1352   09/20/18 1324  Protein, pleural or peritoneal fluid  R     09/20/18 1324   09/20/18 1324  Glucose, pleural or peritoneal fluid  R     09/20/18 1324   09/20/18 1324  PH, Body Fluid  R     09/20/18 1324   09/20/18 1324  Lactate dehydrogenase (pleural or peritoneal fluid)  R     09/20/18 1324   09/20/18 1324  Albumin, pleural or peritoneal fluid  R     09/20/18 1324   09/20/18 1323  Culture, body fluid-bottle  Once,   R     09/20/18 1323   09/20/18 1323  Gram stain  Once,   R     09/20/18 1323   09/20/18 1013  Urinalysis, Routine w reflex microscopic  Once,   R     09/20/18 1012   09/20/18 1011  Sodium, urine, random  Once,   R     09/20/18 1011   09/20/18 1010  HIV antibody (Routine Testing)  Once,   R     09/20/18 1011          Vitals/Pain Today's Vitals   09/20/18 1230 09/20/18 1231 09/20/18 1245 09/20/18 1400  BP:  (!) 184/97 (!) 177/97   Pulse:  86 84 94  Resp:  17 (!) 22   Temp:      TempSrc:      SpO2:  98% 97% 97%  PainSc: 0-No pain       Isolation Precautions No active isolations  Medications Medications  0.9 %  sodium chloride infusion ( Intravenous New Bag/Given 09/20/18 0908)  PARoxetine (PAXIL) tablet 60 mg (has no administration in time range)  traZODone (DESYREL) tablet 50-100 mg (has no administration in time range)  levothyroxine (SYNTHROID) tablet 75 mcg (75 mcg Oral Not Given 09/20/18 1150)  sodium chloride flush (NS) 0.9 % injection 3 mL (3 mLs Intravenous Given 09/20/18 1149)  ondansetron (ZOFRAN) tablet 4 mg (has no administration in time range)    Or  ondansetron (ZOFRAN) injection 4 mg (has no administration in time range)  acetaminophen (TYLENOL) tablet 650 mg (has no  administration in time range)    Or  acetaminophen (TYLENOL) suppository 650 mg (has no administration in time range)  hydrALAZINE (APRESOLINE) injection 10 mg (10 mg Intravenous Given 09/20/18 1236)  nicotine (NICODERM CQ - dosed in mg/24 hours) patch 21 mg (21 mg Transdermal Patch Applied 09/18/92 7654)  folic acid (FOLVITE) tablet 1 mg (1 mg Oral Given 09/20/18 1150)  albuterol (PROVENTIL) (2.5 MG/3ML) 0.083% nebulizer solution 2.5 mg (has no administration in time range)  clotrimazole (LOTRIMIN) 1 % topical solution (has no administration in time range)  pantoprazole (PROTONIX) injection 40 mg (40 mg Intravenous Given 09/20/18 1231)  lidocaine (XYLOCAINE) 1 % (with pres) injection (has no administration in time  range)  lidocaine (PF) (XYLOCAINE) 1 % injection (10 mLs Infiltration Given 09/20/18 1318)    Mobility walks with device Moderate fall risk   Focused Assessments gastrointestinal   R Recommendations: See Admitting Provider Note  Report given to:   Additional Notes:  Pt had thorocentesis in IR, pt covid neg, pt on room air

## 2018-09-21 DIAGNOSIS — F419 Anxiety disorder, unspecified: Secondary | ICD-10-CM | POA: Diagnosis present

## 2018-09-21 DIAGNOSIS — I700 Atherosclerosis of aorta: Secondary | ICD-10-CM | POA: Diagnosis present

## 2018-09-21 DIAGNOSIS — I130 Hypertensive heart and chronic kidney disease with heart failure and stage 1 through stage 4 chronic kidney disease, or unspecified chronic kidney disease: Secondary | ICD-10-CM | POA: Diagnosis present

## 2018-09-21 DIAGNOSIS — R946 Abnormal results of thyroid function studies: Secondary | ICD-10-CM | POA: Diagnosis present

## 2018-09-21 DIAGNOSIS — I313 Pericardial effusion (noninflammatory): Secondary | ICD-10-CM | POA: Diagnosis present

## 2018-09-21 DIAGNOSIS — N200 Calculus of kidney: Secondary | ICD-10-CM | POA: Diagnosis present

## 2018-09-21 DIAGNOSIS — Z87442 Personal history of urinary calculi: Secondary | ICD-10-CM | POA: Diagnosis not present

## 2018-09-21 DIAGNOSIS — Z79890 Hormone replacement therapy (postmenopausal): Secondary | ICD-10-CM | POA: Diagnosis not present

## 2018-09-21 DIAGNOSIS — R0602 Shortness of breath: Secondary | ICD-10-CM | POA: Diagnosis not present

## 2018-09-21 DIAGNOSIS — Z885 Allergy status to narcotic agent status: Secondary | ICD-10-CM | POA: Diagnosis not present

## 2018-09-21 DIAGNOSIS — I5021 Acute systolic (congestive) heart failure: Secondary | ICD-10-CM

## 2018-09-21 DIAGNOSIS — I2510 Atherosclerotic heart disease of native coronary artery without angina pectoris: Secondary | ICD-10-CM | POA: Diagnosis present

## 2018-09-21 DIAGNOSIS — E039 Hypothyroidism, unspecified: Secondary | ICD-10-CM | POA: Diagnosis present

## 2018-09-21 DIAGNOSIS — R700 Elevated erythrocyte sedimentation rate: Secondary | ICD-10-CM | POA: Diagnosis not present

## 2018-09-21 DIAGNOSIS — H538 Other visual disturbances: Secondary | ICD-10-CM | POA: Diagnosis not present

## 2018-09-21 DIAGNOSIS — B353 Tinea pedis: Secondary | ICD-10-CM | POA: Diagnosis present

## 2018-09-21 DIAGNOSIS — E785 Hyperlipidemia, unspecified: Secondary | ICD-10-CM | POA: Diagnosis present

## 2018-09-21 DIAGNOSIS — I5041 Acute combined systolic (congestive) and diastolic (congestive) heart failure: Secondary | ICD-10-CM | POA: Diagnosis not present

## 2018-09-21 DIAGNOSIS — J439 Emphysema, unspecified: Secondary | ICD-10-CM | POA: Diagnosis present

## 2018-09-21 DIAGNOSIS — R609 Edema, unspecified: Secondary | ICD-10-CM | POA: Diagnosis present

## 2018-09-21 DIAGNOSIS — I5043 Acute on chronic combined systolic (congestive) and diastolic (congestive) heart failure: Secondary | ICD-10-CM | POA: Diagnosis present

## 2018-09-21 DIAGNOSIS — Z79899 Other long term (current) drug therapy: Secondary | ICD-10-CM | POA: Diagnosis not present

## 2018-09-21 DIAGNOSIS — D509 Iron deficiency anemia, unspecified: Secondary | ICD-10-CM | POA: Diagnosis not present

## 2018-09-21 DIAGNOSIS — D649 Anemia, unspecified: Secondary | ICD-10-CM | POA: Diagnosis present

## 2018-09-21 DIAGNOSIS — E8809 Other disorders of plasma-protein metabolism, not elsewhere classified: Secondary | ICD-10-CM | POA: Diagnosis present

## 2018-09-21 DIAGNOSIS — J449 Chronic obstructive pulmonary disease, unspecified: Secondary | ICD-10-CM | POA: Diagnosis present

## 2018-09-21 DIAGNOSIS — I160 Hypertensive urgency: Secondary | ICD-10-CM | POA: Diagnosis present

## 2018-09-21 DIAGNOSIS — K219 Gastro-esophageal reflux disease without esophagitis: Secondary | ICD-10-CM | POA: Diagnosis present

## 2018-09-21 DIAGNOSIS — Z20828 Contact with and (suspected) exposure to other viral communicable diseases: Secondary | ICD-10-CM | POA: Diagnosis present

## 2018-09-21 DIAGNOSIS — J90 Pleural effusion, not elsewhere classified: Secondary | ICD-10-CM | POA: Diagnosis not present

## 2018-09-21 DIAGNOSIS — N179 Acute kidney failure, unspecified: Secondary | ICD-10-CM | POA: Diagnosis present

## 2018-09-21 DIAGNOSIS — F329 Major depressive disorder, single episode, unspecified: Secondary | ICD-10-CM | POA: Diagnosis present

## 2018-09-21 LAB — CBC
HCT: 24.1 % — ABNORMAL LOW (ref 36.0–46.0)
Hemoglobin: 8 g/dL — ABNORMAL LOW (ref 12.0–15.0)
MCH: 31.9 pg (ref 26.0–34.0)
MCHC: 33.2 g/dL (ref 30.0–36.0)
MCV: 96 fL (ref 80.0–100.0)
Platelets: 167 10*3/uL (ref 150–400)
RBC: 2.51 MIL/uL — ABNORMAL LOW (ref 3.87–5.11)
RDW: 13.2 % (ref 11.5–15.5)
WBC: 3 10*3/uL — ABNORMAL LOW (ref 4.0–10.5)
nRBC: 0 % (ref 0.0–0.2)

## 2018-09-21 LAB — BASIC METABOLIC PANEL
Anion gap: 10 (ref 5–15)
BUN: 29 mg/dL — ABNORMAL HIGH (ref 8–23)
CO2: 20 mmol/L — ABNORMAL LOW (ref 22–32)
Calcium: 8.5 mg/dL — ABNORMAL LOW (ref 8.9–10.3)
Chloride: 111 mmol/L (ref 98–111)
Creatinine, Ser: 1.67 mg/dL — ABNORMAL HIGH (ref 0.44–1.00)
GFR calc Af Amer: 36 mL/min — ABNORMAL LOW (ref >60)
GFR calc non Af Amer: 31 mL/min — ABNORMAL LOW (ref >60)
Glucose, Bld: 95 mg/dL (ref 70–99)
Potassium: 3.7 mmol/L (ref 3.5–5.1)
Sodium: 141 mmol/L (ref 135–145)

## 2018-09-21 LAB — PH, BODY FLUID: pH, Body Fluid: 7.7

## 2018-09-21 LAB — HIV ANTIBODY (ROUTINE TESTING W REFLEX): HIV Screen 4th Generation wRfx: NONREACTIVE

## 2018-09-21 MED ORDER — AMLODIPINE BESYLATE 5 MG PO TABS
5.0000 mg | ORAL_TABLET | Freq: Every day | ORAL | Status: DC
  Administered 2018-09-21 – 2018-09-22 (×2): 5 mg via ORAL
  Filled 2018-09-21 (×2): qty 1

## 2018-09-21 MED ORDER — CARVEDILOL 6.25 MG PO TABS
6.2500 mg | ORAL_TABLET | Freq: Two times a day (BID) | ORAL | Status: DC
  Administered 2018-09-21 – 2018-09-24 (×6): 6.25 mg via ORAL
  Filled 2018-09-21 (×6): qty 1

## 2018-09-21 MED ORDER — FERROUS SULFATE 325 (65 FE) MG PO TABS
325.0000 mg | ORAL_TABLET | Freq: Every day | ORAL | Status: DC
  Administered 2018-09-22 – 2018-09-24 (×3): 325 mg via ORAL
  Filled 2018-09-21 (×3): qty 1

## 2018-09-21 MED ORDER — CARVEDILOL 3.125 MG PO TABS
3.1250 mg | ORAL_TABLET | ORAL | Status: AC
  Administered 2018-09-21: 3.125 mg via ORAL
  Filled 2018-09-21: qty 1

## 2018-09-21 NOTE — Progress Notes (Signed)
PROGRESS NOTE    Tiffany Leon  IRJ:[id number] DOB: May 24, 1950 DOA: 09/19/2018 PCP: Aretta Nip, MD   Brief Narrative: 68 y.o. female with medical history significant of emphysema, hypothyroidism, anxiety, depression, nephrolithiasis, and GERD; who presented with complaints of nausea, vomiting, and generalized weakness over the last 2 months.  She states that emesis is nonbloody and she is able to keep small amount of food and liquids down.  Notes that she just has poor appetite and despite this is had increasing her weight approximately 15 pounds over the last 1 to 2 months.  In the last 3 weeks the diarrhea symptoms have eased off.  Couple weeks ago she reported being increased on her dose of levothyroxine from 50 mcg to 75 mcg/day.  Her primary physician had referred her to Dr. Michail Sermon gastroenterology who had ordered ultrasound and CAT scan of the abdomen and pelvis.  Ultrasound of the abdomen on 4/29, was noted to be within normal limits.  CTon 5/15 noted, bilateral nephrolithiasis with a 6 mm calculus in the right renal pelvis without signs of hydronephrosis,a nd small pleural effusion with bilateral atelectasis.  Her primary care provider had plan to refer her to [facility] for further work-up. Associated symptoms include shortness of breath, coughing, leg swelling with rash of bilateral feet that is itchy at night, wheezing at night, anxiety with panic attacks, and orthopnea.  She is not on oxygen or inhalers at home.  Patient admits to smoking 3/4 pack of cigarettes per day on average and has done so since the age of 15.  Denies having any significant fever, sick contacts, loss of consciousness, blood in stool, dark stools, abdominal pain, or dysuria symptoms.    ED Course: Upon admission into the emergency department patient seen to be afebrile, pulse 87-104, respirations 16-34, blood pressure elevated up to 182/97, and O2 saturations maintained on room air.  Labs revealed WBC 4.1,  hemoglobin 9, BUN 31, creatinine 1.86, iron studies within normal limits, folate 4.3, and troponin i 0.07.  COVID-19 screening negative.  A CT scan of the chest was obtained noting a moderate right-sided pleural effusion and smaller left-sided pleural effusion progressed from previous, bilateral nephrolithiasis with 11 m stone at right JVP junction, and atherosclerosis of the aorta.  TRH called to admit.  Assessment & Plan:   Principal Problem:   Pleural effusion Active Problems:   SOB (shortness of breath)   Hypertensive urgency   AKI (acute kidney injury) (HCC)   Nausea and vomiting   Hypothyroidism   Tobacco abuse   Tinea pedis   COPD (chronic obstructive pulmonary disease) (HCC)   Nephrolithiasis   #1 pleural effusion status post thoracentesis 900 cc of fluid removed.  Appears to be transudate in nature.  Possibly related to congestive heart failure but other inflammatory disorders cannot be ruled out yet.  With ejection fraction 45 to 50%.  Patient needs inpatient monitoring due to need for continuous IV diuresis as patient still symptomatic with shortness of breath dyspnea on exertion and 2+ lower extremity edema.  Patient reports severely short of breath and dizzy upon walking to the restroom today.  BNP on admission was 680.  LDH is elevated at 268.  Her sed rate is very elevated at 107 and CRP 2.9.  We will repeat these levels tomorrow.  Will need outpatient follow-up with rheumatology if this remains elevated.  This patient needs ongoing inpatient telemetry care due to new onset pleural effusion with hypertensive urgency patient extremely symptomatic with  dizziness shortness of breath and dyspnea on exertion with fluid overload needing IV diuresis and management of blood pressure.  Without this she is at high risk for cardiac arrest and MI and stroke.  #2 hypertensive urgency  bp 161/95 added Norvasc and increase the dose of Coreg to 6.25 mg twice a day.  By cardiology continue to  monitor.?  Would consider further work-up if blood pressure remains high in spite of multiple blood pressure medications on board.  Will need to optimize blood pressure and fluid overload prior to discharge.  #3 AKI improving with diuresis.  #4 hypothyroidism TSH is 5.1 Synthroid dose was changed recently continue the same dose very unlikely that this would have caused pleural effusion or heart failure.  #5 COPD with ongoing tobacco abuse stable counseled against this use of tobacco.  Nicotine patch ordered.  #6 hyperlipidemia with LDL of 131 statin started.  #7normocytic normochromic anemia ?  Related to chronic disease no evidence of active bleeding iron studies revealed low iron however with normal TIBC and ferritin.  Will consider starting iron supplementation.folic acid level low.start supplementation.  #8 kidney stones  increased in size from 9 mm to 11 mm followed by urology Dr. Diona Fanti as an outpatient.  She is asymptomatic from this at this time.  #9 tinea pedis continue Lotrimin  #10 chronic anxiety and depression continue Paxil and Klonopin   DVT prophylaxis: SCD Code Status: Full Family Communication: Updated husband michael Disposition Plan:pending clinical improvement  Consults called:cardiology    Estimated body mass index is 23.84 kg/m as calculated from the following:   Height as of this encounter: 5\' 6"  (1.676 m).   Weight as of this encounter: 67 kg.   Subjective: She reports feeling dizzy and short of breath with any movement  Objective: Vitals:   09/20/18 2351 09/21/18 0600 09/21/18 0725 09/21/18 0900  BP: (!) 161/88  (!) 183/100 137/89  Pulse: 96  96 92  Resp: 20  18   Temp: 98.1 F (36.7 C)  97.9 F (36.6 C)   TempSrc: Oral  Oral   SpO2: 97%  99%   Weight:  67 kg    Height:        Intake/Output Summary (Last 24 hours) at 09/21/2018 1308 Last data filed at 09/21/2018 1101 Gross per 24 hour  Intake 240 ml  Output 450 ml  Net -210 ml   Filed  Weights   09/20/18 1652 09/21/18 0600  Weight: 66.1 kg 67 kg    Examination:  General exam: Appears calm and comfortable  Respiratory system: Crackles bilaterally.  To auscultation. Respiratory effort normal. Cardiovascular system: S1 & S2 heard, RRR. No JVD, murmurs, rubs, gallops or clicks. No pedal edema. Gastrointestinal system: Abdomen is nondistended, soft and nontender. No organomegaly or masses felt. Normal bowel sounds heard. Central nervous system: Alert and oriented. No focal neurological deficits. Extremities: 2 plus edema Skin: No rashes, lesions or ulcers Psychiatry: Judgement and insight appear normal. Mood & affect appropriate.     Data Reviewed: I have personally reviewed following labs and imaging studies  CBC: Recent Labs  Lab 09/19/18 1858 09/20/18 1010 09/21/18 0651  WBC 4.1  --  3.0*  HGB 9.0* 8.3* 8.0*  HCT 26.3* 25.2* 24.1*  MCV 96.0  --  96.0  PLT 199  --  242   Basic Metabolic Panel: Recent Labs  Lab 09/19/18 1858 09/20/18 1200 09/21/18 0651  NA 141 141 141  K 3.6 3.7 3.7  CL 110 112* 111  CO2 19* 21* 20*  GLUCOSE 104* 91 95  BUN 31* 32* 29*  CREATININE 1.86* 1.70* 1.67*  CALCIUM 8.7* 8.5* 8.5*   GFR: Estimated Creatinine Clearance: 29.3 mL/min (A) (by C-G formula based on SCr of 1.67 mg/dL (H)). Liver Function Tests: Recent Labs  Lab 09/20/18 0334  AST 16  ALT 12  ALKPHOS 69  BILITOT 0.7  PROT 6.7  ALBUMIN 3.0*   No results for input(s): LIPASE, AMYLASE in the last 168 hours. No results for input(s): AMMONIA in the last 168 hours. Coagulation Profile: No results for input(s): INR, PROTIME in the last 168 hours. Cardiac Enzymes: Recent Labs  Lab 09/20/18 1559  TROPONINI 0.05*   BNP (last 3 results) No results for input(s): PROBNP in the last 8760 hours. HbA1C: No results for input(s): HGBA1C in the last 72 hours. CBG: No results for input(s): GLUCAP in the last 168 hours. Lipid Profile: Recent Labs     09/20/18 1200  CHOL 186  HDL 31*  LDLCALC 131*  TRIG 120  CHOLHDL 6.0   Thyroid Function Tests: Recent Labs    09/20/18 1200  TSH 5.145*   Anemia Panel: Recent Labs    09/20/18 0334  VITAMINB12 595  FOLATE 4.3*  FERRITIN 288  TIBC 258  IRON 38  RETICCTPCT 2.4   Sepsis Labs: Recent Labs  Lab 09/20/18 1010  PROCALCITON <0.10    Recent Results (from the past 240 hour(s))  SARS Coronavirus 2 (CEPHEID - Performed in [facility] hospital lab), Hosp Order     Status: None   Collection Time: 09/20/18  2:55 AM  Result Value Ref Range Status   SARS Coronavirus 2 NEGATIVE NEGATIVE Final    Comment: (NOTE) If result is NEGATIVE SARS-CoV-2 target nucleic acids are NOT DETECTED. The SARS-CoV-2 RNA is generally detectable in upper and lower  respiratory specimens during the acute phase of infection. The lowest  concentration of SARS-CoV-2 viral copies this assay can detect is 250  copies / mL. A negative result does not preclude SARS-CoV-2 infection  and should not be used as the sole basis for treatment or other  patient management decisions.  A negative result may occur with  improper specimen collection / handling, submission of specimen other  than nasopharyngeal swab, presence of viral mutation(s) within the  areas targeted by this assay, and inadequate number of viral copies  (<250 copies / mL). A negative result must be combined with clinical  observations, patient history, and epidemiological information. If result is POSITIVE SARS-CoV-2 target nucleic acids are DETECTED. The SARS-CoV-2 RNA is generally detectable in upper and lower  respiratory specimens dur ing the acute phase of infection.  Positive  results are indicative of active infection with SARS-CoV-2.  Clinical  correlation with patient history and other diagnostic information is  necessary to determine patient infection status.  Positive results do  not rule out bacterial infection or co-infection with  other viruses. If result is PRESUMPTIVE POSTIVE SARS-CoV-2 nucleic acids MAY BE PRESENT.   A presumptive positive result was obtained on the submitted specimen  and confirmed on repeat testing.  While 2019 novel coronavirus  (SARS-CoV-2) nucleic acids may be present in the submitted sample  additional confirmatory testing may be necessary for epidemiological  and / or clinical management purposes  to differentiate between  SARS-CoV-2 and other Sarbecovirus currently known to infect humans.  If clinically indicated additional testing with an alternate test  methodology [phone]) is advised. The SARS-CoV-2 RNA is generally  detectable  in upper and lower respiratory sp ecimens during the acute  phase of infection. The expected result is Negative. Fact Sheet for Patients:  StrictlyIdeas.no Fact Sheet for Healthcare Providers: BankingDealers.co.za This test is not yet approved or cleared by the [place] FDA and has been authorized for detection and/or diagnosis of SARS-CoV-2 by FDA under an Emergency Use Authorization (EUA).  This EUA will remain in effect (meaning this test can be used) for the duration of the COVID-19 declaration under Section 564(b)(1) of the Act, 21 U.S.C. section 360bbb-3(b)(1), unless the authorization is terminated or revoked sooner. Performed at [facility], [place] [address], [place], [place] [address]   Culture, body fluid-bottle     Status: None (Preliminary result)   Collection Time: 09/20/18  1:23 PM  Result Value Ref Range Status   Specimen Description PLEURAL RIGHT  Final   Special Requests NONE  Final   Culture   Final    NO GROWTH < 24 HOURS Performed at [facility], [place] [address], [place], [place] [address]    Report Status PENDING  Incomplete  Gram stain     Status: None   Collection Time: 09/20/18  1:23 PM  Result Value Ref Range Status   Specimen Description PLEURAL RIGHT  Final    Special Requests NONE  Final   Gram Stain   Final    ABUNDANT WBC PRESENT, PREDOMINANTLY MONONUCLEAR NO ORGANISMS SEEN Performed at [facility], [address], [place], [place] [address]    Report Status 09/20/2018 FINAL  Final         Radiology Studies: Dg Chest 1 View  Result Date: 09/20/2018 CLINICAL DATA:  Initial evaluation for pleural effusion. Status post right thoracentesis. EXAM: CHEST  1 VIEW COMPARISON:  Prior CT from earlier the same day. FINDINGS: Cardiac and mediastinal silhouettes are stable in size and contour, and remain within normal limits. Aortic atherosclerosis. Lungs well inflated. No pneumothorax seen status post thoracentesis. Small residual right-sided pleural effusion, decreased from previous. Small amount of trapped fluid noted along the right minor fissure. Improved aeration at the right lung base. No new focal airspace disease. No pulmonary edema. No acute osseous finding. IMPRESSION: 1. Small residual right-sided pleural effusion status post thoracentesis, decreased from previous. No pneumothorax. 2. Improved aeration at the right lung base. 3. Aortic atherosclerosis. Electronically Signed   By: Jeannine Boga M.D.   On: 09/20/2018 13:45   Dg Chest 2 View  Result Date: 09/19/2018 CLINICAL DATA:  Shortness of breath, productive cough. EXAM: CHEST - 2 VIEW COMPARISON:  Radiographs of August 08, 2014. FINDINGS: The heart size and mediastinal contours are within normal limits. Left lung is clear. Right lower lobe atelectasis or infiltrate is noted with mild right pleural effusion. The visualized skeletal structures are unremarkable. IMPRESSION: Right lower lobe opacity is noted concerning for atelectasis or pneumonia with mild right pleural effusion. Electronically Signed   By: Marijo Conception M.D.   On: 09/19/2018 19:39   Ct Chest Wo Contrast  Result Date: 09/20/2018 CLINICAL DATA:  Shortness of breath and cough for 2 weeks EXAM: CT CHEST WITHOUT CONTRAST  TECHNIQUE: Multidetector CT imaging of the chest was performed following the standard protocol without IV contrast. COMPARISON:  Chest x-ray from 2 days ago.  Abdominal CT 09/01/2018 FINDINGS: Cardiovascular: Normal heart size. Small pericardial effusion. Aortic and coronary atherosclerosis. Mediastinum/Nodes: Negative for adenopathy or mass. Lungs/Pleura: Hyperinflation with mild emphysema. There is a moderate right and small left  layering pleural effusion with atelectasis or scarring. Upper Abdomen: Bilateral nephrolithiasis with a 11 mm stone at the right UPJ. No urinary obstruction where visualized. Musculoskeletal: No acute or aggressive finding IMPRESSION: 1. Pleural effusion that is moderate on the right and small on the left, increased from 09/01/2018 abdominal CT. 2. Small pericardial effusion which is new from prior. 3. Atherosclerosis and bilateral nephrolithiasis. 4. Mild emphysema. Electronically Signed   By: Monte Fantasia M.D.   On: 09/20/2018 04:19   Ir Thoracentesis Asp Pleural Space W/img Guide  Result Date: 09/20/2018 INDICATION: Patient with history of dyspnea, pericardial effusion, and right pleural effusion. Request is made for diagnostic and therapeutic right thoracentesis. EXAM: ULTRASOUND GUIDED DIAGNOSTIC AND THERAPEUTIC RIGHT THORACENTESIS MEDICATIONS: 10 mL 1% lidocaine COMPLICATIONS: None immediate. PROCEDURE: An ultrasound guided thoracentesis was thoroughly discussed with the patient and questions answered. The benefits, risks, alternatives and complications were also discussed. The patient understands and wishes to proceed with the procedure. Written consent was obtained. Ultrasound was performed to localize and mark an adequate pocket of fluid in the right chest. The area was then prepped and draped in the normal sterile fashion. 1% Lidocaine was used for local anesthesia. Under ultrasound guidance a 6 Fr Safe-T-Centesis catheter was introduced. Thoracentesis was performed. The  catheter was removed and a dressing applied. FINDINGS: A total of approximately 900 mL of clear yellow fluid was removed. Samples were sent to the laboratory as requested by the clinical team. IMPRESSION: Successful ultrasound guided right thoracentesis yielding 900 mL of pleural fluid. Read by: Earley Abide, PA-C Electronically Signed   By: Jerilynn Mages.  Shick M.D.   On: 09/20/2018 13:50        Scheduled Meds:  amLODipine  5 mg Oral Daily   aspirin  81 mg Oral Daily   atorvastatin  40 mg Oral q1800   carvedilol  6.25 mg Oral BID WC   clotrimazole   Topical BID   folic acid  1 mg Oral Daily   furosemide  40 mg Intravenous BID   levothyroxine  75 mcg Oral Daily   nicotine  21 mg Transdermal Daily   pantoprazole (PROTONIX) IV  40 mg Intravenous Q12H   PARoxetine  60 mg Oral QHS   potassium chloride  20 mEq Oral Daily   sodium chloride flush  3 mL Intravenous Q12H   traZODone  50-100 mg Oral QHS   Continuous Infusions:   LOS: 0 days     Georgette Shell, MD [facility]  If 7PM-7AM, please contact night-coverage [email or web address] Password [email or web address] 09/21/2018, 1:08 PM

## 2018-09-21 NOTE — Progress Notes (Signed)
DAILY PROGRESS NOTE   Patient Name: Tiffany Leon Date of Encounter: 09/21/2018 Cardiologist: Pixie Casino, MD  Chief Complaint   Feels better today  Patient Profile   Tiffany Leon is a 68 y.o. female with a history of tob abuse, emphysema, GERD, depression, hypothyroidism, and anxiety, who is being seen today for the evaluation of dyspnea and CHF at the request of Dr. Tamala Julian.  Subjective   Remains hypertensive overnight- no recorded diuresis noted, however, she did urinate a good bit. Weight is up.   Objective   Vitals:   09/20/18 1652 09/20/18 2351 09/21/18 0600 09/21/18 0725  BP:  (!) 161/88  (!) 183/100  Pulse:  96  96  Resp:  20  18  Temp:  98.1 F (36.7 C)  97.9 F (36.6 C)  TempSrc:  Oral  Oral  SpO2:  97%  99%  Weight: 66.1 kg  67 kg   Height: 5\' 6"  (1.676 m)      No intake or output data in the 24 hours ending 09/21/18 0917 Filed Weights   09/20/18 1652 09/21/18 0600  Weight: 66.1 kg 67 kg    Physical Exam   General appearance: alert and no distress Neck: JVD - 5 cm above sternal notch, no carotid bruit and thyroid not enlarged, symmetric, no tenderness/mass/nodules Lungs: diminished breath sounds bilaterally Heart: regular rate and rhythm Abdomen: soft, non-tender; bowel sounds normal; no masses,  no organomegaly Extremities: edema 1-2+ bilateral LE edema Pulses: 2+ and symmetric Skin: Skin color, texture, turgor normal. No rashes or lesions Neurologic: Grossly normal Psych: Pleasant  Inpatient Medications    Scheduled Meds:  aspirin  81 mg Oral Daily   atorvastatin  40 mg Oral q1800   carvedilol  3.125 mg Oral BID WC   clotrimazole   Topical BID   folic acid  1 mg Oral Daily   furosemide  40 mg Intravenous BID   levothyroxine  75 mcg Oral Daily   nicotine  21 mg Transdermal Daily   pantoprazole (PROTONIX) IV  40 mg Intravenous Q12H   PARoxetine  60 mg Oral QHS   potassium chloride  20 mEq Oral Daily   sodium chloride flush  3  mL Intravenous Q12H   traZODone  50-100 mg Oral QHS    Continuous Infusions:   PRN Meds: acetaminophen **OR** acetaminophen, albuterol, hydrALAZINE, ondansetron **OR** ondansetron (ZOFRAN) IV   Labs   Results for orders placed or performed during the hospital encounter of 09/19/18 (from the past 48 hour(s))  Basic metabolic panel     Status: Abnormal   Collection Time: 09/19/18  6:58 PM  Result Value Ref Range   Sodium 141 135 - 145 mmol/L   Potassium 3.6 3.5 - 5.1 mmol/L   Chloride 110 98 - 111 mmol/L   CO2 19 (L) 22 - 32 mmol/L   Glucose, Bld 104 (H) 70 - 99 mg/dL   BUN 31 (H) 8 - 23 mg/dL   Creatinine, Ser 1.86 (H) 0.44 - 1.00 mg/dL   Calcium 8.7 (L) 8.9 - 10.3 mg/dL   GFR calc non Af Amer 27 (L) >60 mL/min   GFR calc Af Amer 31 (L) >60 mL/min   Anion gap 12 5 - 15    Comment: Performed at [facility], [address], [place], [place] [address]  CBC     Status: Abnormal   Collection Time: 09/19/18  6:58 PM  Result Value Ref Range   WBC 4.1 4.0 - 10.5 K/uL  RBC 2.74 (L) 3.87 - 5.11 MIL/uL   Hemoglobin 9.0 (L) 12.0 - 15.0 g/dL   HCT 26.3 (L) 36.0 - 46.0 %   MCV 96.0 80.0 - 100.0 fL   MCH 32.8 26.0 - 34.0 pg   MCHC 34.2 30.0 - 36.0 g/dL   RDW 13.2 11.5 - 15.5 %   Platelets 199 150 - 400 K/uL   nRBC 0.0 0.0 - 0.2 %    Comment: Performed at [facility] [address] [place], [place] [address]  I-Stat Troponin, ED (not at [facility])     Status: None   Collection Time: 09/19/18  7:06 PM  Result Value Ref Range   Troponin i, poc 0.07 0.00 - 0.08 ng/mL   Comment 3            Comment: Due to the release kinetics of cTnI, a negative result within the first hours of the onset of symptoms does not rule out myocardial infarction with certainty. If myocardial infarction is still suspected, repeat the test at appropriate intervals.   SARS Coronavirus 2 (CEPHEID - Performed in [facility] hospital lab), Hosp Order     Status: None   Collection Time: 09/20/18   2:55 AM  Result Value Ref Range   SARS Coronavirus 2 NEGATIVE NEGATIVE    Comment: (NOTE) If result is NEGATIVE SARS-CoV-2 target nucleic acids are NOT DETECTED. The SARS-CoV-2 RNA is generally detectable in upper and lower  respiratory specimens during the acute phase of infection. The lowest  concentration of SARS-CoV-2 viral copies this assay can detect is 250  copies / mL. A negative result does not preclude SARS-CoV-2 infection  and should not be used as the sole basis for treatment or other  patient management decisions.  A negative result may occur with  improper specimen collection / handling, submission of specimen other  than nasopharyngeal swab, presence of viral mutation(s) within the  areas targeted by this assay, and inadequate number of viral copies  (<250 copies / mL). A negative result must be combined with clinical  observations, patient history, and epidemiological information. If result is POSITIVE SARS-CoV-2 target nucleic acids are DETECTED. The SARS-CoV-2 RNA is generally detectable in upper and lower  respiratory specimens dur ing the acute phase of infection.  Positive  results are indicative of active infection with SARS-CoV-2.  Clinical  correlation with patient history and other diagnostic information is  necessary to determine patient infection status.  Positive results do  not rule out bacterial infection or co-infection with other viruses. If result is PRESUMPTIVE POSTIVE SARS-CoV-2 nucleic acids MAY BE PRESENT.   A presumptive positive result was obtained on the submitted specimen  and confirmed on repeat testing.  While 2019 novel coronavirus  (SARS-CoV-2) nucleic acids may be present in the submitted sample  additional confirmatory testing may be necessary for epidemiological  and / or clinical management purposes  to differentiate between  SARS-CoV-2 and other Sarbecovirus currently known to infect humans.  If clinically indicated additional  testing with an alternate test  methodology [phone]) is advised. The SARS-CoV-2 RNA is generally  detectable in upper and lower respiratory sp ecimens during the acute  phase of infection. The expected result is Negative. Fact Sheet for Patients:  StrictlyIdeas.no Fact Sheet for Healthcare Providers: BankingDealers.co.za This test is not yet approved or cleared by the [place] FDA and has been authorized for detection and/or diagnosis of SARS-CoV-2 by FDA under an Emergency Use Authorization (EUA).  This EUA will  remain in effect (meaning this test can be used) for the duration of the COVID-19 declaration under Section 564(b)(1) of the Act, 21 U.S.C. section 360bbb-3(b)(1), unless the authorization is terminated or revoked sooner. Performed at [facility], [place] [address], [place], [place] [address]   Hepatic function panel     Status: Abnormal   Collection Time: 09/20/18  3:34 AM  Result Value Ref Range   Total Protein 6.7 6.5 - 8.1 g/dL   Albumin 3.0 (L) 3.5 - 5.0 g/dL   AST 16 15 - 41 U/L   ALT 12 0 - 44 U/L   Alkaline Phosphatase 69 38 - 126 U/L   Total Bilirubin 0.7 0.3 - 1.2 mg/dL   Bilirubin, Direct <0.1 0.0 - 0.2 mg/dL   Indirect Bilirubin NOT CALCULATED 0.3 - 0.9 mg/dL    Comment: Performed at [facility] [address] [place], [place] [address]  POC occult blood, ED     Status: None   Collection Time: 09/20/18  3:34 AM  Result Value Ref Range   Fecal Occult Bld NEGATIVE NEGATIVE  Vitamin B12     Status: None   Collection Time: 09/20/18  3:34 AM  Result Value Ref Range   Vitamin B-12 595 180 - 914 pg/mL    Comment: (NOTE) This assay is not validated for testing neonatal or myeloproliferative syndrome specimens for Vitamin B12 levels. Performed at [facility], [place] [address] [place], [place] [address]   Folate     Status: Abnormal   Collection Time: 09/20/18  3:34 AM  Result Value Ref  Range   Folate 4.3 (L) >5.9 ng/mL    Comment: Performed at [facility], [place] [address] [place], [place] [address]  Iron and TIBC     Status: None   Collection Time: 09/20/18  3:34 AM  Result Value Ref Range   Iron 38 28 - 170 ug/dL   TIBC 258 250 - 450 ug/dL   Saturation Ratios 15 10.4 - 31.8 %   UIBC 220 ug/dL    Comment: Performed at [facility], [place] [address], [place], [place] [address]  Ferritin     Status: None   Collection Time: 09/20/18  3:34 AM  Result Value Ref Range   Ferritin 288 11 - 307 ng/mL    Comment: Performed at [facility], [place] [address], [place], [place] [address]  Reticulocytes     Status: Abnormal   Collection Time: 09/20/18  3:34 AM  Result Value Ref Range   Retic Ct Pct 2.4 0.4 - 3.1 %   RBC. 2.83 (L) 3.87 - 5.11 MIL/uL   Retic Count, Absolute 66.8 19.0 - 186.0 K/uL   Immature Retic Fract 9.0 2.3 - 15.9 %    Comment: Performed at [facility] [address], [place], [place] [address]  Type and screen [place]     Status: None   Collection Time: 09/20/18  3:34 AM  Result Value Ref Range   ABO/RH(D) O POS    Antibody Screen NEG    Sample Expiration      09/23/2018,2359 Performed at [facility], [place] [address], [place], [place] [address]   ABO/Rh     Status: None   Collection Time: 09/20/18  3:34 AM  Result Value Ref Range   ABO/RH(D)      O POS Performed at [facility] [place],  [place] [address]   I-stat troponin, ED     Status: None   Collection Time: 09/20/18  3:48 AM  Result Value Ref Range   Troponin i, poc 0.06 0.00 - 0.08 ng/mL   Comment 3            Comment: Due to the release kinetics of cTnI, a negative result within the first hours of the onset of symptoms does not rule out myocardial infarction with certainty. If myocardial infarction is still suspected, repeat the test at appropriate intervals.   HIV antibody (Routine Testing)     Status: None    Collection Time: 09/20/18 10:10 AM  Result Value Ref Range   HIV Screen 4th Generation wRfx Non Reactive Non Reactive    Comment: (NOTE) Performed At: [facility] [place], [place] [id number] Rush Farmer MD [id number]   Procalcitonin - Baseline     Status: None   Collection Time: 09/20/18 10:10 AM  Result Value Ref Range   Procalcitonin <0.10 ng/mL    Comment:        Interpretation: PCT (Procalcitonin) <= 0.5 ng/mL: Systemic infection (sepsis) is not likely. Local bacterial infection is possible. (NOTE)       Sepsis PCT Algorithm           Lower Respiratory Tract                                      Infection PCT Algorithm    ----------------------------     ----------------------------         PCT < 0.25 ng/mL                PCT < 0.10 ng/mL         Strongly encourage             Strongly discourage   discontinuation of antibiotics    initiation of antibiotics    ----------------------------     -----------------------------       PCT 0.25 - 0.50 ng/mL            PCT 0.10 - 0.25 ng/mL               OR       >80% decrease in PCT            Discourage initiation of                                            antibiotics      Encourage discontinuation           of antibiotics    ----------------------------     -----------------------------         PCT >= 0.50 ng/mL              PCT 0.26 - 0.50 ng/mL               AND        <80% decrease in PCT             Encourage initiation of  antibiotics       Encourage continuation           of antibiotics    ----------------------------     -----------------------------        PCT >= 0.50 ng/mL                  PCT > 0.50 ng/mL               AND         increase in PCT                  Strongly encourage                                      initiation of antibiotics    Strongly encourage escalation           of antibiotics                                      -----------------------------                                           PCT <= 0.25 ng/mL                                                 OR                                        > 80% decrease in PCT                                     Discontinue / Do not initiate                                             antibiotics Performed at [facility], [address], [place], [place] [address]   Hemoglobin and hematocrit, blood     Status: Abnormal   Collection Time: 09/20/18 10:10 AM  Result Value Ref Range   Hemoglobin 8.3 (L) 12.0 - 15.0 g/dL   HCT 25.2 (L) 36.0 - 46.0 %    Comment: Performed at [facility], [place] [address] [place], [place] [address]  Sedimentation rate     Status: Abnormal   Collection Time: 09/20/18 10:10 AM  Result Value Ref Range   Sed Rate 107 (H) 0 - 22 mm/hr    Comment: Performed at [facility] [address] [place], [place] [address]  C-reactive protein     Status: Abnormal   Collection Time: 09/20/18 10:10 AM  Result Value Ref Range   CRP 2.9 (H) <1.0 mg/dL    Comment: Performed at [facility] [address], [place], [place] [address]  Brain natriuretic peptide  Status: Abnormal   Collection Time: 09/20/18 12:00 PM  Result Value Ref Range   B Natriuretic Peptide 608.1 (H) 0.0 - 100.0 pg/mL    Comment: Performed at [facility] [address] [place], [place] [address]  TSH     Status: Abnormal   Collection Time: 09/20/18 12:00 PM  Result Value Ref Range   TSH 5.145 (H) 0.350 - 4.500 uIU/mL    Comment: Performed by a 3rd Generation assay with a functional sensitivity of <=0.01 uIU/mL. Performed at [facility], [place] [address], [place], [place] [address]   Basic metabolic panel     Status: Abnormal   Collection Time: 09/20/18 12:00 PM  Result Value Ref Range   Sodium 141 135 - 145 mmol/L   Potassium 3.7 3.5 - 5.1 mmol/L   Chloride 112 (H) 98 - 111 mmol/L   CO2 21 (L) 22 - 32 mmol/L   Glucose, Bld 91  70 - 99 mg/dL   BUN 32 (H) 8 - 23 mg/dL   Creatinine, Ser 1.70 (H) 0.44 - 1.00 mg/dL   Calcium 8.5 (L) 8.9 - 10.3 mg/dL   GFR calc non Af Amer 30 (L) >60 mL/min   GFR calc Af Amer 35 (L) >60 mL/min   Anion gap 8 5 - 15    Comment: Performed at [facility] [address], [place], [place] [address]  Lipid panel     Status: Abnormal   Collection Time: 09/20/18 12:00 PM  Result Value Ref Range   Cholesterol 186 0 - 200 mg/dL   Triglycerides 120 <150 mg/dL   HDL 31 (L) >40 mg/dL   Total CHOL/HDL Ratio 6.0 RATIO   VLDL 24 0 - 40 mg/dL   LDL Cholesterol 131 (H) 0 - 99 mg/dL    Comment:        Total Cholesterol/HDL:CHD Risk Coronary Heart Disease Risk Table                     Men   Women  1/2 Average Risk   3.4   3.3  Average Risk       5.0   4.4  2 X Average Risk   9.6   7.1  3 X Average Risk  23.4   11.0        Use the calculated Patient Ratio above and the CHD Risk Table to determine the patient's CHD Risk.        ATP III CLASSIFICATION (LDL):  <100     mg/dL   Optimal  100-129  mg/dL   Near or Above                    Optimal  130-159  mg/dL   Borderline  160-189  mg/dL   High  >190     mg/dL   Very High Performed at [facility] [address] [place], [place] [address]   Lactate dehydrogenase (pleural or peritoneal fluid)     Status: Abnormal   Collection Time: 09/20/18  1:23 PM  Result Value Ref Range   LD, Fluid 62 (H) 3 - 23 U/L    Comment: (NOTE) Results should be evaluated in conjunction with serum values    Fluid Type-FLDH Pleural R     Comment: Performed at [facility] [address], [place], [place] [address]  Body fluid cell count with differential     Status: Abnormal   Collection  Time: 09/20/18  1:23 PM  Result Value Ref Range   Fluid Type-FCT Pleural R    Color, Fluid STRAW YELLOW   Appearance, Fluid HAZY (A) CLEAR   WBC, Fluid 313 0 - 1,000 cu mm   Neutrophil Count, Fluid 1 0 - 25 %   Lymphs, Fluid 73 %    Monocyte-Macrophage-Serous Fluid 26 (L) 50 - 90 %   Eos, Fluid 0 %   Other Cells, Fluid MESOTHELIAL CELLS PRESENT %    Comment: Performed at [facility], [place] [address] [place], [place] [address]  Albumin, pleural or peritoneal fluid     Status: None   Collection Time: 09/20/18  1:23 PM  Result Value Ref Range   Albumin, Fluid <1.0 g/dL    Comment: REPEATED TO VERIFY   Fluid Type-FALB Pleural R     Comment: Performed at [facility] [address], [place], [place] [address]  Protein, pleural or peritoneal fluid     Status: None   Collection Time: 09/20/18  1:23 PM  Result Value Ref Range   Total protein, fluid <3.0 g/dL    Comment: REPEATED TO VERIFY   Fluid Type-FTP Pleural R     Comment: Performed at [facility] [address] [place], [place] [address]  Glucose, pleural or peritoneal fluid     Status: None   Collection Time: 09/20/18  1:23 PM  Result Value Ref Range   Glucose, Fluid 98 mg/dL    Comment: (NOTE) No normal range established for this test Results should be evaluated in conjunction with serum values    Fluid Type-FGLU Pleural R     Comment: Performed at [facility] [address], [place], [place] [address]  Culture, body fluid-bottle     Status: None (Preliminary result)   Collection Time: 09/20/18  1:23 PM  Result Value Ref Range   Specimen Description PLEURAL RIGHT    Special Requests NONE    Culture      NO GROWTH < 24 HOURS Performed at [facility], [place] [address] [place], [place] [address]    Report Status PENDING   Gram stain     Status: None   Collection Time: 09/20/18  1:23 PM  Result Value Ref Range   Specimen Description PLEURAL RIGHT    Special Requests NONE    Gram Stain      ABUNDANT WBC PRESENT, PREDOMINANTLY MONONUCLEAR NO ORGANISMS SEEN Performed at [facility], [place] [address], [place], [place] [address]    Report Status 09/20/2018 FINAL   Troponin I - Now Then Q6H     Status: Abnormal    Collection Time: 09/20/18  3:59 PM  Result Value Ref Range   Troponin I 0.05 (HH) <0.03 ng/mL    Comment: CRITICAL RESULT CALLED TO, READ BACK BY AND VERIFIED WITH: S.PERRY RN 1708 09/20/2018 MCCORMICK K Performed at [facility], [place] [address] [place], [place] [address]   Lactate dehydrogenase     Status: Abnormal   Collection Time: 09/20/18  3:59 PM  Result Value Ref Range   LDH 268 (H) 98 - 192 U/L    Comment: Performed at [facility], [place] [address] [place], [place] [address]  Sodium, urine, random     Status: None   Collection Time: 09/20/18  9:00 PM  Result Value Ref Range   Sodium, Ur 31 mmol/L    Comment: Performed at [facility], [address] [place], [place] [address]  Urinalysis, Routine w reflex microscopic     Status: Abnormal   Collection Time: 09/20/18  9:00 PM  Result Value Ref Range   Color, Urine YELLOW YELLOW   APPearance HAZY (A) CLEAR   Specific Gravity, Urine 1.018 1.005 - 1.030   pH 5.0 5.0 - 8.0   Glucose, UA NEGATIVE NEGATIVE mg/dL   Hgb urine dipstick LARGE (A) NEGATIVE   Bilirubin Urine NEGATIVE NEGATIVE   Ketones, ur 5 (A) NEGATIVE mg/dL   Protein, ur >=300 (A) NEGATIVE mg/dL   Nitrite NEGATIVE NEGATIVE   Leukocytes,Ua NEGATIVE NEGATIVE   RBC / HPF 21-50 0 - 5 RBC/hpf   WBC, UA 6-10 0 - 5 WBC/hpf   Bacteria, UA RARE (A) NONE SEEN   Squamous Epithelial / LPF 0-5 0 - 5   Mucus PRESENT    Hyaline Casts, UA PRESENT     Comment: Performed at [facility], [place] [address], [place], [place] [address]  Creatinine, urine, random     Status: None   Collection Time: 09/20/18  9:00 PM  Result Value Ref Range   Creatinine, Urine 193.54 mg/dL    Comment: Performed at [facility] [address] [place], [place] [address]  CBC     Status: Abnormal   Collection Time: 09/21/18  6:51 AM  Result Value Ref Range   WBC 3.0 (L) 4.0 - 10.5 K/uL   RBC 2.51 (L) 3.87 - 5.11 MIL/uL   Hemoglobin 8.0 (L) 12.0 - 15.0 g/dL   HCT  24.1 (L) 36.0 - 46.0 %   MCV 96.0 80.0 - 100.0 fL   MCH 31.9 26.0 - 34.0 pg   MCHC 33.2 30.0 - 36.0 g/dL   RDW 13.2 11.5 - 15.5 %   Platelets 167 150 - 400 K/uL   nRBC 0.0 0.0 - 0.2 %    Comment: Performed at [facility], [place] [address] [place], [place] [address]  Basic metabolic panel     Status: Abnormal   Collection Time: 09/21/18  6:51 AM  Result Value Ref Range   Sodium 141 135 - 145 mmol/L   Potassium 3.7 3.5 - 5.1 mmol/L   Chloride 111 98 - 111 mmol/L   CO2 20 (L) 22 - 32 mmol/L   Glucose, Bld 95 70 - 99 mg/dL   BUN 29 (H) 8 - 23 mg/dL   Creatinine, Ser 1.67 (H) 0.44 - 1.00 mg/dL   Calcium 8.5 (L) 8.9 - 10.3 mg/dL   GFR calc non Af Amer 31 (L) >60 mL/min   GFR calc Af Amer 36 (L) >60 mL/min   Anion gap 10 5 - 15    Comment: Performed at [facility] [address], [place], [place] [address]    ECG   N/A  Telemetry   Sinus rhythm - Personally Reviewed  Radiology    Dg Chest 1 View  Result Date: 09/20/2018 CLINICAL DATA:  Initial evaluation for pleural effusion. Status post right thoracentesis. EXAM: CHEST  1 VIEW COMPARISON:  Prior CT from earlier the same day. FINDINGS: Cardiac and mediastinal silhouettes are stable in size and contour, and remain within normal limits. Aortic atherosclerosis. Lungs well inflated. No pneumothorax seen status post thoracentesis. Small residual right-sided pleural effusion, decreased from previous. Small amount of trapped fluid noted along the right minor fissure. Improved aeration at the right lung base. No new focal airspace disease. No pulmonary edema. No acute  osseous finding. IMPRESSION: 1. Small residual right-sided pleural effusion status post thoracentesis, decreased from previous. No pneumothorax. 2. Improved aeration at the right lung base. 3. Aortic atherosclerosis. Electronically Signed   By: Jeannine Boga M.D.   On: 09/20/2018 13:45   Dg Chest 2 View  Result Date: 09/19/2018 CLINICAL DATA:  Shortness of  breath, productive cough. EXAM: CHEST - 2 VIEW COMPARISON:  Radiographs of August 08, 2014. FINDINGS: The heart size and mediastinal contours are within normal limits. Left lung is clear. Right lower lobe atelectasis or infiltrate is noted with mild right pleural effusion. The visualized skeletal structures are unremarkable. IMPRESSION: Right lower lobe opacity is noted concerning for atelectasis or pneumonia with mild right pleural effusion. Electronically Signed   By: Marijo Conception M.D.   On: 09/19/2018 19:39   Ct Chest Wo Contrast  Result Date: 09/20/2018 CLINICAL DATA:  Shortness of breath and cough for 2 weeks EXAM: CT CHEST WITHOUT CONTRAST TECHNIQUE: Multidetector CT imaging of the chest was performed following the standard protocol without IV contrast. COMPARISON:  Chest x-ray from 2 days ago.  Abdominal CT 09/01/2018 FINDINGS: Cardiovascular: Normal heart size. Small pericardial effusion. Aortic and coronary atherosclerosis. Mediastinum/Nodes: Negative for adenopathy or mass. Lungs/Pleura: Hyperinflation with mild emphysema. There is a moderate right and small left layering pleural effusion with atelectasis or scarring. Upper Abdomen: Bilateral nephrolithiasis with a 11 mm stone at the right UPJ. No urinary obstruction where visualized. Musculoskeletal: No acute or aggressive finding IMPRESSION: 1. Pleural effusion that is moderate on the right and small on the left, increased from 09/01/2018 abdominal CT. 2. Small pericardial effusion which is new from prior. 3. Atherosclerosis and bilateral nephrolithiasis. 4. Mild emphysema. Electronically Signed   By: Monte Fantasia M.D.   On: 09/20/2018 04:19   Ir Thoracentesis Asp Pleural Space W/img Guide  Result Date: 09/20/2018 INDICATION: Patient with history of dyspnea, pericardial effusion, and right pleural effusion. Request is made for diagnostic and therapeutic right thoracentesis. EXAM: ULTRASOUND GUIDED DIAGNOSTIC AND THERAPEUTIC RIGHT THORACENTESIS  MEDICATIONS: 10 mL 1% lidocaine COMPLICATIONS: None immediate. PROCEDURE: An ultrasound guided thoracentesis was thoroughly discussed with the patient and questions answered. The benefits, risks, alternatives and complications were also discussed. The patient understands and wishes to proceed with the procedure. Written consent was obtained. Ultrasound was performed to localize and mark an adequate pocket of fluid in the right chest. The area was then prepped and draped in the normal sterile fashion. 1% Lidocaine was used for local anesthesia. Under ultrasound guidance a 6 Fr Safe-T-Centesis catheter was introduced. Thoracentesis was performed. The catheter was removed and a dressing applied. FINDINGS: A total of approximately 900 mL of clear yellow fluid was removed. Samples were sent to the laboratory as requested by the clinical team. IMPRESSION: Successful ultrasound guided right thoracentesis yielding 900 mL of pleural fluid. Read by: [place] [place], PA-C Electronically Signed   By: Jerilynn Mages.  Shick M.D.   On: 09/20/2018 13:50    Cardiac Studies    Procedure: Cardiac Doppler, Color Doppler and 2D Echo  Indications:    I31.3 Pericardial effusion (noninflammatory)   History:        Patient has no prior history of Echocardiogram examinations.                 COPD Signs/Symptoms: Shortness of Breath and Dyspnea Risk                 Factors: Hypertension. Post thoracentesis. Pleural effusion.   Sonographer:  Roseanna Rainbow Referring Phys: [id number] RONDELL A SMITH  IMPRESSIONS    1. 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 The cavity size was normal. Left ventricular diastolic function could not be evaluated secondary to atrial fibrillation. Elevated left ventricular  end-diastolic pressure Left ventricular diffuse hypokinesis.  2. The right ventricle has normal systolic function. The cavity was normal. There is no increase in right ventricular wall  thickness. Right ventricular systolic pressure could not be assessed  3. Trivial to small circumferential pericardial effusion is present with prominent fat pad over the RV.  4. The aortic valve was not well visualized. Aortic valve regurgitation was not assessed by color flow Doppler.  5. The inferior vena cava was normal in size with <50% respiratory variability.  6. The interatrial septum appears to be lipomatous.  Assessment   1. Principal Problem: 2.   Pleural effusion 3. Active Problems: 4.   SOB (shortness of breath) 5.   Hypertensive urgency 6.   AKI (acute kidney injury) ([place]) 7.   Nausea and vomiting 8.   Hypothyroidism 9.   Tobacco abuse 10.   Tinea pedis 11.   COPD (chronic obstructive pulmonary disease) ([place]) 12.   Nephrolithiasis 13.   Plan   1. Did diurese, however, not recorded. Reminded patient and staff to track. BP still elevated, gave extra coreg today and increased to 6.25 mg BID. Added amlodipine 5 mg daily today. Etiology of hypertension is not clear - ?due to CAD (noted CAC) or related to CHF. ?renal artery stenosis - would recommend outpatient renal and LE arterial dopplers. Will likely need an ischemia evaluation when more compensated.  Time Spent Directly with Patient:  I have spent a total of 35 minutes with the patient reviewing hospital notes, telemetry, EKGs, labs and examining the patient as well as establishing an assessment and plan that was discussed personally with the patient.  > 50% of time was spent in direct patient care.  Length of Stay:  LOS: 0 days   Pixie Casino, MD, [facility], [place] Director of the [facility]  [facility] Diplomate of the American Board of Clinical Lipidology Attending Cardiologist  Direct Dial: [phone]   Fax: [phone]  Website:  [email or web address] Karole Oo 09/21/2018, 9:17 AM

## 2018-09-21 NOTE — Evaluation (Signed)
Physical Therapy Evaluation Patient Details Name: Tiffany Leon MRN: [id number] DOB: [birth year] Today's Date: 09/21/2018   History of Present Illness  Tiffany Leon is a 68 y.o. female with a history of tob abuse, emphysema, GERD, depression, hypothyroidism, and anxiety, who is being seen today for the evaluation of dyspnea and CHF at the request of Dr. Tamala Julian.  Clinical Impression  Pt admitted with above diagnosis. Pt currently with functional limitations due to the deficits listed below (see PT Problem List). Pt was able to ambulate but needed min guard assist and pt wanting to use walls to steady herself at times.  Pt would benefit from rollator to use.  Sats were >90%on RA with entire treatment. DOE 3/4 at end of walk and pt very visibly fatigued.  Will follow acutely.   Pt will benefit from skilled PT to increase their independence and safety with mobility to allow discharge to the venue listed below.      Follow Up Recommendations Home health PT    Equipment Recommendations  3in1 (PT)(rollator)    Recommendations for Other Services       Precautions / Restrictions Precautions Precautions: Fall Restrictions Weight Bearing Restrictions: No      Mobility  Bed Mobility Overal bed mobility: Independent             General bed mobility comments: incr time and effort  Transfers Overall transfer level: Independent                  Ambulation/Gait Ambulation/Gait assistance: Min guard Gait Distance (Feet): 200 Feet Assistive device: None;1 person hand held assist Gait Pattern/deviations: Step-through pattern;Decreased stride length   Gait velocity interpretation: <1.31 ft/sec, indicative of household ambulator General Gait Details: Pt walked to nursing desk and back. At times, had to stop and pt would reach for rail to steady herself.  States her legs feel like they are going to giveout.  Not grossly unsteady but does need min guard assist for safety.  Discussed use of  rollator at home for added stability and so she can rest when she needs to. Sats >90% on RA.   Stairs            Wheelchair Mobility    Modified Rankin (Stroke Patients Only)       Balance Overall balance assessment: Needs assistance Sitting-balance support: No upper extremity supported;Feet supported Sitting balance-Leahy Scale: Fair     Standing balance support: No upper extremity supported;During functional activity;Single extremity supported Standing balance-Leahy Scale: Poor Standing balance comment: relies on UE support intermittently.                              Pertinent Vitals/Pain Pain Assessment: No/denies pain    Home Living Family/patient expects to be discharged to:: Private residence Living Arrangements: Spouse/significant other Available Help at Discharge: Family;Available 24 hours/day Type of Home: House Home Access: Stairs to enter Entrance Stairs-Rails: None Entrance Stairs-Number of Steps: 3 Home Layout: One level Home Equipment: None      Prior Function Level of Independence: Independent               Hand Dominance        Extremity/Trunk Assessment   Upper Extremity Assessment Upper Extremity Assessment: Defer to OT evaluation    Lower Extremity Assessment Lower Extremity Assessment: Generalized weakness    Cervical / Trunk Assessment Cervical / Trunk Assessment: Normal  Communication   Communication: No difficulties  Cognition  Arousal/Alertness: Awake/alert Behavior During Therapy: Flat affect Overall Cognitive Status: Within Functional Limits for tasks assessed                                        General Comments      Exercises     Assessment/Plan    PT Assessment Patient needs continued PT services  PT Problem List Decreased activity tolerance;Decreased balance;Decreased knowledge of use of DME;Decreased safety awareness;Cardiopulmonary status limiting activity;Decreased  mobility       PT Treatment Interventions DME instruction;Gait training;Functional mobility training;Therapeutic activities;Therapeutic exercise;Balance training;Patient/family education;Stair training    PT Goals (Current goals can be found in the Care Plan section)  Acute Rehab PT Goals Patient Stated Goal: to gohome PT Goal Formulation: With patient Time For Goal Achievement: 10/05/18 Potential to Achieve Goals: Good    Frequency Min 3X/week   Barriers to discharge Decreased caregiver support      Co-evaluation               AM-PAC PT "6 Clicks" Mobility  Outcome Measure Help needed turning from your back to your side while in a flat bed without using bedrails?: None Help needed moving from lying on your back to sitting on the side of a flat bed without using bedrails?: None Help needed moving to and from a bed to a chair (including a wheelchair)?: A Little Help needed standing up from a chair using your arms (e.g., wheelchair or bedside chair)?: A Little Help needed to walk in hospital room?: A Little Help needed climbing 3-5 steps with a railing? : A Little 6 Click Score: 20    End of Session Equipment Utilized During Treatment: Gait belt Activity Tolerance: Patient limited by fatigue Patient left: in chair;with call bell/phone within reach;with chair alarm set Nurse Communication: Mobility status PT Visit Diagnosis: Muscle weakness (generalized) (M62.81);Unsteadiness on feet (R26.81)    Time: 7121-9758 PT Time Calculation (min) (ACUTE ONLY): 14 min   Charges:   PT Evaluation $PT Eval Moderate Complexity: 1 Mod          Elray Dains,PT Acute Rehabilitation Services Pager:  [phone]  Office:  [phone]    Denice Paradise 09/21/2018, 1:39 PM

## 2018-09-22 DIAGNOSIS — R700 Elevated erythrocyte sedimentation rate: Secondary | ICD-10-CM

## 2018-09-22 DIAGNOSIS — I700 Atherosclerosis of aorta: Secondary | ICD-10-CM

## 2018-09-22 DIAGNOSIS — M791 Myalgia, unspecified site: Secondary | ICD-10-CM

## 2018-09-22 DIAGNOSIS — H538 Other visual disturbances: Secondary | ICD-10-CM

## 2018-09-22 DIAGNOSIS — D509 Iron deficiency anemia, unspecified: Secondary | ICD-10-CM

## 2018-09-22 DIAGNOSIS — Z885 Allergy status to narcotic agent status: Secondary | ICD-10-CM

## 2018-09-22 DIAGNOSIS — I5042 Chronic combined systolic (congestive) and diastolic (congestive) heart failure: Secondary | ICD-10-CM

## 2018-09-22 DIAGNOSIS — F17210 Nicotine dependence, cigarettes, uncomplicated: Secondary | ICD-10-CM

## 2018-09-22 LAB — COMPREHENSIVE METABOLIC PANEL
ALT: 11 U/L (ref 0–44)
AST: 12 U/L — ABNORMAL LOW (ref 15–41)
Albumin: 2.5 g/dL — ABNORMAL LOW (ref 3.5–5.0)
Alkaline Phosphatase: 51 U/L (ref 38–126)
Anion gap: 12 (ref 5–15)
BUN: 30 mg/dL — ABNORMAL HIGH (ref 8–23)
CO2: 18 mmol/L — ABNORMAL LOW (ref 22–32)
Calcium: 8.5 mg/dL — ABNORMAL LOW (ref 8.9–10.3)
Chloride: 113 mmol/L — ABNORMAL HIGH (ref 98–111)
Creatinine, Ser: 1.7 mg/dL — ABNORMAL HIGH (ref 0.44–1.00)
GFR calc Af Amer: 35 mL/min — ABNORMAL LOW (ref >60)
GFR calc non Af Amer: 30 mL/min — ABNORMAL LOW (ref >60)
Glucose, Bld: 89 mg/dL (ref 70–99)
Potassium: 3.7 mmol/L (ref 3.5–5.1)
Sodium: 143 mmol/L (ref 135–145)
Total Bilirubin: 0.7 mg/dL (ref 0.3–1.2)
Total Protein: 5.5 g/dL — ABNORMAL LOW (ref 6.5–8.1)

## 2018-09-22 LAB — CBC WITH DIFFERENTIAL/PLATELET
Abs Immature Granulocytes: 0.01 10*3/uL (ref 0.00–0.07)
Basophils Absolute: 0 10*3/uL (ref 0.0–0.1)
Basophils Relative: 0 %
Eosinophils Absolute: 0 10*3/uL (ref 0.0–0.5)
Eosinophils Relative: 1 %
HCT: 22.8 % — ABNORMAL LOW (ref 36.0–46.0)
Hemoglobin: 7.5 g/dL — ABNORMAL LOW (ref 12.0–15.0)
Immature Granulocytes: 0 %
Lymphocytes Relative: 44 %
Lymphs Abs: 1.3 10*3/uL (ref 0.7–4.0)
MCH: 32.1 pg (ref 26.0–34.0)
MCHC: 32.9 g/dL (ref 30.0–36.0)
MCV: 97.4 fL (ref 80.0–100.0)
Monocytes Absolute: 0.3 10*3/uL (ref 0.1–1.0)
Monocytes Relative: 9 %
Neutro Abs: 1.3 10*3/uL — ABNORMAL LOW (ref 1.7–7.7)
Neutrophils Relative %: 46 %
Platelets: 170 10*3/uL (ref 150–400)
RBC: 2.34 MIL/uL — ABNORMAL LOW (ref 3.87–5.11)
RDW: 13.2 % (ref 11.5–15.5)
WBC: 2.9 10*3/uL — ABNORMAL LOW (ref 4.0–10.5)
nRBC: 0 % (ref 0.0–0.2)

## 2018-09-22 LAB — SEDIMENTATION RATE: Sed Rate: 117 mm/hr — ABNORMAL HIGH (ref 0–22)

## 2018-09-22 LAB — C-REACTIVE PROTEIN: CRP: 2.5 mg/dL — ABNORMAL HIGH (ref <1.0)

## 2018-09-22 MED ORDER — SODIUM CHLORIDE 0.9 % IV SOLN
40.0000 mg | Freq: Once | INTRAVENOUS | Status: AC
  Administered 2018-09-23: 40 mg via INTRAVENOUS
  Filled 2018-09-22: qty 4

## 2018-09-22 MED ORDER — SODIUM CHLORIDE 0.9 % IV SOLN
510.0000 mg | Freq: Once | INTRAVENOUS | Status: AC
  Administered 2018-09-23: 510 mg via INTRAVENOUS
  Filled 2018-09-22: qty 17

## 2018-09-22 MED ORDER — CLONAZEPAM 0.5 MG PO TABS
0.5000 mg | ORAL_TABLET | Freq: Two times a day (BID) | ORAL | Status: DC | PRN
  Administered 2018-09-22 – 2018-09-24 (×3): 0.5 mg via ORAL
  Filled 2018-09-22 (×3): qty 1

## 2018-09-22 MED ORDER — METHYLPREDNISOLONE SODIUM SUCC 125 MG IJ SOLR
80.0000 mg | Freq: Once | INTRAMUSCULAR | Status: AC
  Administered 2018-09-23: 80 mg via INTRAVENOUS
  Filled 2018-09-22: qty 2

## 2018-09-22 NOTE — Progress Notes (Signed)
DAILY PROGRESS NOTE   Patient Name: Tiffany Leon Date of Encounter: 09/22/2018 Cardiologist: Pixie Casino, MD  Chief Complaint   Breathing better  Patient Profile   Tiffany Leon is a 68 y.o. female with a history of tob abuse, emphysema, GERD, depression, hypothyroidism, and anxiety, who is being seen today for the evaluation of dyspnea and CHF at the request of Dr. Tamala Julian.  Subjective   Blood pressure improved overall. Recorded diuresis of almost 1L. Weights not charted. BNP 608. Creatinine stable at 1.7. LDL 131. CRP and ESR remain elevated.   Objective   Vitals:   09/21/18 0900 09/21/18 1632 09/21/18 2354 09/22/18 0934  BP: 137/89 (!) 155/85 138/76 (!) 150/90  Pulse: 92 90 85 97  Resp:  18 19 18   Temp:  98.3 F (36.8 C) 98.1 F (36.7 C) (!) 97.4 F (36.3 C)  TempSrc:  Oral Oral Oral  SpO2:  97% 96% 99%  Weight:      Height:        Intake/Output Summary (Last 24 hours) at 09/22/2018 1104 Last data filed at 09/21/2018 2115 Gross per 24 hour  Intake 360 ml  Output 1000 ml  Net -640 ml   Filed Weights   09/20/18 1652 09/21/18 0600  Weight: 66.1 kg 67 kg    Physical Exam   General appearance: alert and no distress Neck: JVD - 3 cm above sternal notch, no carotid bruit and thyroid not enlarged, symmetric, no tenderness/mass/nodules Lungs: diminished breath sounds bilaterally Heart: regular rate and rhythm Abdomen: soft, non-tender; bowel sounds normal; no masses,  no organomegaly Extremities: edema 1+ bilateral LE edema Pulses: 2+ and symmetric Skin: Skin color, texture, turgor normal. No rashes or lesions Neurologic: Mental status: Alert, oriented, thought content appropriate, improved UE tremor Psych: mildly anxious  Inpatient Medications    Scheduled Meds:  amLODipine  5 mg Oral Daily   aspirin  81 mg Oral Daily   atorvastatin  40 mg Oral q1800   carvedilol  6.25 mg Oral BID WC   clotrimazole   Topical BID   ferrous sulfate  325 mg Oral Q  breakfast   folic acid  1 mg Oral Daily   furosemide  40 mg Intravenous BID   levothyroxine  75 mcg Oral Daily   nicotine  21 mg Transdermal Daily   pantoprazole (PROTONIX) IV  40 mg Intravenous Q12H   PARoxetine  60 mg Oral QHS   potassium chloride  20 mEq Oral Daily   sodium chloride flush  3 mL Intravenous Q12H   traZODone  50-100 mg Oral QHS    Continuous Infusions:   PRN Meds: acetaminophen **OR** acetaminophen, albuterol, clonazePAM, hydrALAZINE, ondansetron **OR** ondansetron (ZOFRAN) IV   Labs   Results for orders placed or performed during the hospital encounter of 09/19/18 (from the past 48 hour(s))  Brain natriuretic peptide     Status: Abnormal   Collection Time: 09/20/18 12:00 PM  Result Value Ref Range   B Natriuretic Peptide 608.1 (H) 0.0 - 100.0 pg/mL    Comment: Performed at [facility], [address] [place], [place] [address]  TSH     Status: Abnormal   Collection Time: 09/20/18 12:00 PM  Result Value Ref Range   TSH 5.145 (H) 0.350 - 4.500 uIU/mL    Comment: Performed by a 3rd Generation assay with a functional sensitivity of <=0.01 uIU/mL. Performed at [facility], [place] [address], [place], [place] [address]   Basic metabolic panel  Status: Abnormal   Collection Time: 09/20/18 12:00 PM  Result Value Ref Range   Sodium 141 135 - 145 mmol/L   Potassium 3.7 3.5 - 5.1 mmol/L   Chloride 112 (H) 98 - 111 mmol/L   CO2 21 (L) 22 - 32 mmol/L   Glucose, Bld 91 70 - 99 mg/dL   BUN 32 (H) 8 - 23 mg/dL   Creatinine, Ser 1.70 (H) 0.44 - 1.00 mg/dL   Calcium 8.5 (L) 8.9 - 10.3 mg/dL   GFR calc non Af Amer 30 (L) >60 mL/min   GFR calc Af Amer 35 (L) >60 mL/min   Anion gap 8 5 - 15    Comment: Performed at [facility] [address] [place], [place] [address]  Lipid panel     Status: Abnormal   Collection Time: 09/20/18 12:00 PM  Result Value Ref Range   Cholesterol 186 0 - 200 mg/dL   Triglycerides 120 <150 mg/dL   HDL 31  (L) >40 mg/dL   Total CHOL/HDL Ratio 6.0 RATIO   VLDL 24 0 - 40 mg/dL   LDL Cholesterol 131 (H) 0 - 99 mg/dL    Comment:        Total Cholesterol/HDL:CHD Risk Coronary Heart Disease Risk Table                     Men   Women  1/2 Average Risk   3.4   3.3  Average Risk       5.0   4.4  2 X Average Risk   9.6   7.1  3 X Average Risk  23.4   11.0        Use the calculated Patient Ratio above and the CHD Risk Table to determine the patient's CHD Risk.        ATP III CLASSIFICATION (LDL):  <100     mg/dL   Optimal  100-129  mg/dL   Near or Above                    Optimal  130-159  mg/dL   Borderline  160-189  mg/dL   High  >190     mg/dL   Very High Performed at [facility] [address], [place], [place] [address]   Lactate dehydrogenase (pleural or peritoneal fluid)     Status: Abnormal   Collection Time: 09/20/18  1:23 PM  Result Value Ref Range   LD, Fluid 62 (H) 3 - 23 U/L    Comment: (NOTE) Results should be evaluated in conjunction with serum values    Fluid Type-FLDH Pleural R     Comment: Performed at [facility] [address], [place], [place] [address]  Body fluid cell count with differential     Status: Abnormal   Collection Time: 09/20/18  1:23 PM  Result Value Ref Range   Fluid Type-FCT Pleural R    Color, Fluid STRAW YELLOW   Appearance, Fluid HAZY (A) CLEAR   WBC, Fluid 313 0 - 1,000 cu mm   Neutrophil Count, Fluid 1 0 - 25 %   Lymphs, Fluid 73 %   Monocyte-Macrophage-Serous Fluid 26 (L) 50 - 90 %   Eos, Fluid 0 %   Other Cells, Fluid MESOTHELIAL CELLS PRESENT %    Comment: Performed at [facility], [place] [address] [place], [place] [address]  Albumin, pleural or peritoneal fluid     Status: None  Collection Time: 09/20/18  1:23 PM  Result Value Ref Range   Albumin, Fluid <1.0 g/dL    Comment: REPEATED TO VERIFY   Fluid Type-FALB Pleural R     Comment: Performed at [facility] [address], [place], [place]  [address]  Protein, pleural or peritoneal fluid     Status: None   Collection Time: 09/20/18  1:23 PM  Result Value Ref Range   Total protein, fluid <3.0 g/dL    Comment: REPEATED TO VERIFY   Fluid Type-FTP Pleural R     Comment: Performed at [facility] [address] [place], [place] [address]  Glucose, pleural or peritoneal fluid     Status: None   Collection Time: 09/20/18  1:23 PM  Result Value Ref Range   Glucose, Fluid 98 mg/dL    Comment: (NOTE) No normal range established for this test Results should be evaluated in conjunction with serum values    Fluid Type-FGLU Pleural R     Comment: Performed at [facility] [address], [place], [place] [address]  PH, Body Fluid     Status: None   Collection Time: 09/20/18  1:23 PM  Result Value Ref Range   pH, Body Fluid 7.7 Not Estab.    Comment: (NOTE) This test was developed and its performance characteristics determined by [facility]. It has not been cleared or approved by the Food and Drug Administration. The reference interval(s) and other method performance specifications have not been established for this body fluid. The test result must be integrated into the clinical context for interpretation. Performed At: [facility] [place], [place] [id number] Rush Farmer MD HY:[id number]    Source of Sample PLEU RIGHT     Comment: Performed at [facility], [place] [address] [place], [place] [address]  Culture, body fluid-bottle     Status: None (Preliminary result)   Collection Time: 09/20/18  1:23 PM  Result Value Ref Range   Specimen Description PLEURAL RIGHT    Special Requests NONE    Culture      NO GROWTH 2 DAYS Performed at [facility], [place] [address], [place], [place] [address]    Report Status PENDING   Gram stain     Status: None   Collection Time: 09/20/18  1:23 PM  Result Value Ref Range   Specimen Description PLEURAL RIGHT    Special Requests NONE    Gram  Stain      ABUNDANT WBC PRESENT, PREDOMINANTLY MONONUCLEAR NO ORGANISMS SEEN Performed at [facility], [place] [address], [place], [place] [address]    Report Status 09/20/2018 FINAL   Troponin I - Now Then Q6H     Status: Abnormal   Collection Time: 09/20/18  3:59 PM  Result Value Ref Range   Troponin I 0.05 (HH) <0.03 ng/mL    Comment: CRITICAL RESULT CALLED TO, READ BACK BY AND VERIFIED WITH: S.PERRY RN 1708 09/20/2018 MCCORMICK K Performed at [facility], [place] [address] [place], [place] [address]   Lactate dehydrogenase     Status: Abnormal   Collection Time: 09/20/18  3:59 PM  Result Value Ref Range   LDH 268 (H) 98 - 192 U/L    Comment: Performed at [facility], [place] [address], [place], [place] [address]  Sodium, urine, random     Status: None   Collection Time: 09/20/18  9:00 PM  Result Value Ref  Range   Sodium, Ur 31 mmol/L    Comment: Performed at [facility] [address] [place], [place] [address]  Urinalysis, Routine w reflex microscopic     Status: Abnormal   Collection Time: 09/20/18  9:00 PM  Result Value Ref Range   Color, Urine YELLOW YELLOW   APPearance HAZY (A) CLEAR   Specific Gravity, Urine 1.018 1.005 - 1.030   pH 5.0 5.0 - 8.0   Glucose, UA NEGATIVE NEGATIVE mg/dL   Hgb urine dipstick LARGE (A) NEGATIVE   Bilirubin Urine NEGATIVE NEGATIVE   Ketones, ur 5 (A) NEGATIVE mg/dL   Protein, ur >=300 (A) NEGATIVE mg/dL   Nitrite NEGATIVE NEGATIVE   Leukocytes,Ua NEGATIVE NEGATIVE   RBC / HPF 21-50 0 - 5 RBC/hpf   WBC, UA 6-10 0 - 5 WBC/hpf   Bacteria, UA RARE (A) NONE SEEN   Squamous Epithelial / LPF 0-5 0 - 5   Mucus PRESENT    Hyaline Casts, UA PRESENT     Comment: Performed at [facility], [place] [address], [place], [place] [address]  Creatinine, urine, random     Status: None   Collection Time: 09/20/18  9:00 PM  Result Value Ref Range   Creatinine, Urine 193.54 mg/dL    Comment: Performed at [facility] [address] [place], [place] [address]  CBC     Status: Abnormal   Collection Time: 09/21/18  6:51 AM  Result Value Ref Range   WBC 3.0 (L) 4.0 - 10.5 K/uL   RBC 2.51 (L) 3.87 - 5.11 MIL/uL   Hemoglobin 8.0 (L) 12.0 - 15.0 g/dL   HCT 24.1 (L) 36.0 - 46.0 %   MCV 96.0 80.0 - 100.0 fL   MCH 31.9 26.0 - 34.0 pg   MCHC 33.2 30.0 - 36.0 g/dL   RDW 13.2 11.5 - 15.5 %   Platelets 167 150 - 400 K/uL   nRBC 0.0 0.0 - 0.2 %    Comment: Performed at [facility], [place] [address] [place], [place] [address]  Basic metabolic panel     Status: Abnormal   Collection Time: 09/21/18  6:51 AM  Result Value Ref Range   Sodium 141 135 - 145 mmol/L   Potassium 3.7 3.5 - 5.1 mmol/L   Chloride 111 98 - 111 mmol/L   CO2 20 (L) 22 - 32 mmol/L   Glucose, Bld 95 70 - 99 mg/dL   BUN 29 (H) 8 - 23 mg/dL   Creatinine, Ser 1.67 (H) 0.44 - 1.00 mg/dL   Calcium 8.5 (L) 8.9 - 10.3 mg/dL   GFR calc non Af Amer 31 (L) >60 mL/min   GFR calc Af Amer 36 (L) >60 mL/min   Anion gap 10 5 - 15    Comment: Performed at [facility] [address], [place], [place] [address]  CBC with Differential/Platelet     Status: Abnormal   Collection Time: 09/22/18  5:27 AM  Result Value Ref Range   WBC 2.9 (L) 4.0 - 10.5 K/uL   RBC 2.34 (L) 3.87 - 5.11 MIL/uL   Hemoglobin 7.5 (L) 12.0 - 15.0 g/dL   HCT 22.8 (L) 36.0 - 46.0 %   MCV 97.4 80.0 - 100.0 fL   MCH 32.1 26.0 - 34.0 pg   MCHC 32.9 30.0 - 36.0 g/dL   RDW 13.2 11.5 - 15.5 %   Platelets 170 150 - 400 K/uL   nRBC  0.0 0.0 - 0.2 %   Neutrophils Relative % 46 %   Neutro Abs 1.3 (L) 1.7 - 7.7 K/uL   Lymphocytes Relative 44 %   Lymphs Abs 1.3 0.7 - 4.0 K/uL   Monocytes Relative 9 %   Monocytes Absolute 0.3 0.1 - 1.0 K/uL   Eosinophils Relative 1 %   Eosinophils Absolute 0.0 0.0 - 0.5 K/uL   Basophils Relative 0 %   Basophils Absolute 0.0 0.0 - 0.1 K/uL   Immature Granulocytes 0 %   Abs Immature Granulocytes 0.01 0.00 - 0.07 K/uL    Comment: Performed  at [facility] [address], [place], [place] [address]  Comprehensive metabolic panel     Status: Abnormal   Collection Time: 09/22/18  5:27 AM  Result Value Ref Range   Sodium 143 135 - 145 mmol/L   Potassium 3.7 3.5 - 5.1 mmol/L   Chloride 113 (H) 98 - 111 mmol/L   CO2 18 (L) 22 - 32 mmol/L   Glucose, Bld 89 70 - 99 mg/dL   BUN 30 (H) 8 - 23 mg/dL   Creatinine, Ser 1.70 (H) 0.44 - 1.00 mg/dL   Calcium 8.5 (L) 8.9 - 10.3 mg/dL   Total Protein 5.5 (L) 6.5 - 8.1 g/dL   Albumin 2.5 (L) 3.5 - 5.0 g/dL   AST 12 (L) 15 - 41 U/L   ALT 11 0 - 44 U/L   Alkaline Phosphatase 51 38 - 126 U/L   Total Bilirubin 0.7 0.3 - 1.2 mg/dL   GFR calc non Af Amer 30 (L) >60 mL/min   GFR calc Af Amer 35 (L) >60 mL/min   Anion gap 12 5 - 15    Comment: Performed at [facility], [place] [address], [place], [place] [address]  Sedimentation rate     Status: Abnormal   Collection Time: 09/22/18  5:27 AM  Result Value Ref Range   Sed Rate 117 (H) 0 - 22 mm/hr    Comment: Performed at [facility] [address], [place], [place] [address]  C-reactive protein     Status: Abnormal   Collection Time: 09/22/18  5:27 AM  Result Value Ref Range   CRP 2.5 (H) <1.0 mg/dL    Comment: Performed at [facility] [address] [place], [place] [address]    ECG   N/A  Telemetry   Sinus rhythm - Personally Reviewed  Radiology    Dg Chest 1 View  Result Date: 09/20/2018 CLINICAL DATA:  Initial evaluation for pleural effusion. Status post right thoracentesis. EXAM: CHEST  1 VIEW COMPARISON:  Prior CT from earlier the same day. FINDINGS: Cardiac and mediastinal silhouettes are stable in size and contour, and remain within normal limits. Aortic atherosclerosis. Lungs well inflated. No pneumothorax seen status post thoracentesis. Small residual right-sided pleural effusion, decreased from previous. Small amount of trapped fluid noted along the right minor fissure. Improved aeration at the  right lung base. No new focal airspace disease. No pulmonary edema. No acute osseous finding. IMPRESSION: 1. Small residual right-sided pleural effusion status post thoracentesis, decreased from previous. No pneumothorax. 2. Improved aeration at the right lung base. 3. Aortic atherosclerosis. Electronically Signed   By: Jeannine Boga M.D.   On: 09/20/2018 13:45   Ir Thoracentesis Asp Pleural Space W/img Guide  Result Date: 09/20/2018 INDICATION: Patient with history of dyspnea, pericardial effusion, and right pleural effusion. Request is made for diagnostic and therapeutic right thoracentesis.  EXAM: ULTRASOUND GUIDED DIAGNOSTIC AND THERAPEUTIC RIGHT THORACENTESIS MEDICATIONS: 10 mL 1% lidocaine COMPLICATIONS: None immediate. PROCEDURE: An ultrasound guided thoracentesis was thoroughly discussed with the patient and questions answered. The benefits, risks, alternatives and complications were also discussed. The patient understands and wishes to proceed with the procedure. Written consent was obtained. Ultrasound was performed to localize and mark an adequate pocket of fluid in the right chest. The area was then prepped and draped in the normal sterile fashion. 1% Lidocaine was used for local anesthesia. Under ultrasound guidance a 6 Fr Safe-T-Centesis catheter was introduced. Thoracentesis was performed. The catheter was removed and a dressing applied. FINDINGS: A total of approximately 900 mL of clear yellow fluid was removed. Samples were sent to the laboratory as requested by the clinical team. IMPRESSION: Successful ultrasound guided right thoracentesis yielding 900 mL of pleural fluid. Read by: Earley Abide, PA-C Electronically Signed   By: Jerilynn Mages.  Shick M.D.   On: 09/20/2018 13:50    Cardiac Studies    Procedure: Cardiac Doppler, Color Doppler and 2D Echo  Indications:    I31.3 Pericardial effusion (noninflammatory)   History:        Patient has no prior history of Echocardiogram  examinations.                 COPD Signs/Symptoms: Shortness of Breath and Dyspnea Risk                 Factors: Hypertension. Post thoracentesis. Pleural effusion.   Sonographer:    Roseanna Rainbow Referring Phys: [id number] RONDELL A SMITH  IMPRESSIONS    1. 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 The cavity size was normal. Left ventricular diastolic function could not be evaluated secondary to atrial fibrillation. Elevated left ventricular  end-diastolic pressure Left ventricular diffuse hypokinesis.  2. The right ventricle has normal systolic function. The cavity was normal. There is no increase in right ventricular wall thickness. Right ventricular systolic pressure could not be assessed  3. Trivial to small circumferential pericardial effusion is present with prominent fat pad over the RV.  4. The aortic valve was not well visualized. Aortic valve regurgitation was not assessed by color flow Doppler.  5. The inferior vena cava was normal in size with <50% respiratory variability.  6. The interatrial septum appears to be lipomatous.  Assessment   Principal Problem:   Pleural effusion Active Problems:   SOB (shortness of breath)   Hypertensive urgency   AKI (acute kidney injury) (HCC)   Nausea and vomiting   Hypothyroidism   Tobacco abuse   Tinea pedis   COPD (chronic obstructive pulmonary disease) (HCC)   Nephrolithiasis   Plan   1. Some response to diuretics - weight not recorded today. Would continue diuresis today. Repeat CRP and ESR elevated, would recommend basic autoimmune work-up such as ANA, RF, ANCAs, etc.. also consider malignancy or infection as possibilities given ESR >100. BP improved - continue current meds. Recommending outpatient LE arterial dopplers and renal dopplers as well as outpatient ischemia evaluation given CKD 3b. LDL 130 - goal <70, now on atorvastatin 40 mg QHS.  Time Spent Directly with  Patient:  I have spent a total of 25 minutes with the patient reviewing hospital notes, telemetry, EKGs, labs and examining the patient as well as establishing an assessment and plan that was discussed personally with the patient.  > 50% of time was spent in direct patient care.  Length of Stay:  LOS: 1 day   Pixie Casino, MD, FACC, [place] Director of the [facility] Diplomate of the American Board of Clinical Lipidology Attending Cardiologist  Direct Dial: [phone]   Fax: [phone]  Website:  [email or web address] [email or web address] Bove 09/22/2018, 11:04 AM

## 2018-09-22 NOTE — Progress Notes (Signed)
Physical Therapy Treatment Patient Details Name: Tiffany Leon MRN: [id number] DOB: 03/28/1951 Today's Date: 09/22/2018    History of Present Illness Tiffany Leon is a 68 y.o. female with a history of tob abuse, emphysema, GERD, depression, hypothyroidism, and anxiety, who is being seen today for the evaluation of dyspnea and CHF at the request of Dr. Tamala Julian.    PT Comments    Pt admitted with above diagnosis. Pt currently with functional limitations due to the deficits listed below (see PT Problem List). Pt was able to ambulate with rollator in hallway and use safe technique.  This helps pts fatigue level and pt can ambulate with less DOE.  Pt interested in gettign a rollator for home.  Will continue acute PT.  Pt will benefit from skilled PT to increase their independence and safety with mobility to allow discharge to the venue listed below.     Follow Up Recommendations  Home health PT     Equipment Recommendations  3in1 (PT)(rollator)    Recommendations for Other Services       Precautions / Restrictions Precautions Precautions: Fall Restrictions Weight Bearing Restrictions: Yes    Mobility  Bed Mobility Overal bed mobility: Independent             General bed mobility comments: incr time and effort  Transfers Overall transfer level: Independent                  Ambulation/Gait Ambulation/Gait assistance: Min guard Gait Distance (Feet): 250 Feet(150 feet then 100 feet) Assistive device: 4-wheeled walker Gait Pattern/deviations: Step-through pattern;Decreased stride length   Gait velocity interpretation: <1.31 ft/sec, indicative of household ambulator General Gait Details: Pt walked to nursing desk and demonstrated appropriate locking of brakes to rest on rollator.  Good technique geting up as well.   Pt feels much better today per report.  Pt not grossly unsteady but does need min guard assist for safety.  Discussed use of rollator at home for added  stability/rest breaks and pt wants to get one. Sats >90% on RA.    Stairs             Wheelchair Mobility    Modified Rankin (Stroke Patients Only)       Balance Overall balance assessment: Needs assistance Sitting-balance support: No upper extremity supported;Feet supported Sitting balance-Leahy Scale: Fair     Standing balance support: No upper extremity supported;During functional activity;Single extremity supported Standing balance-Leahy Scale: Poor Standing balance comment: relies on UE support intermittently.                             Cognition Arousal/Alertness: Awake/alert Behavior During Therapy: Flat affect Overall Cognitive Status: Within Functional Limits for tasks assessed                                        Exercises      General Comments        Pertinent Vitals/Pain Pain Assessment: No/denies pain    Home Living                      Prior Function            PT Goals (current goals can now be found in the care plan section) Acute Rehab PT Goals Patient Stated Goal: to gohome Progress towards PT goals: Progressing toward  goals    Frequency    Min 3X/week      PT Plan Current plan remains appropriate    Co-evaluation              AM-PAC PT "6 Clicks" Mobility   Outcome Measure  Help needed turning from your back to your side while in a flat bed without using bedrails?: None Help needed moving from lying on your back to sitting on the side of a flat bed without using bedrails?: None Help needed moving to and from a bed to a chair (including a wheelchair)?: A Little Help needed standing up from a chair using your arms (e.g., wheelchair or bedside chair)?: A Little Help needed to walk in hospital room?: A Little Help needed climbing 3-5 steps with a railing? : A Little 6 Click Score: 20    End of Session Equipment Utilized During Treatment: Gait belt Activity Tolerance: Patient  limited by fatigue Patient left: with call bell/phone within reach;in bed;with bed alarm set Nurse Communication: Mobility status PT Visit Diagnosis: Muscle weakness (generalized) (M62.81);Unsteadiness on feet (R26.81)     Time: 7824-2353 PT Time Calculation (min) (ACUTE ONLY): 14 min  Charges:  $Gait Training: 8-22 mins                     [place] Pager:  [phone]  Office:  [place] 09/22/2018, 4:22 PM

## 2018-09-22 NOTE — Consult Note (Signed)
Referral MD  Reason for Referral: Normochromic normocytic anemia; leukopenia; elevated sedimentation rate; right pleural effusion  Chief Complaint  Patient presents with  . Emesis  . Diarrhea  . Chest Pain  : I did not feel well and came to the hospital.  HPI: Tiffany Leon is a very nice 68 year old white female.  She does have multiple medical problems.  She said that about 3 weeks before Easter she started to feel poorly.  She just did not have a lot of energy.  She has some shortness of breath.  She went to see her family doctor.  She was found to have some kidney issues.  She was then referred to nephrology.  She has not yet seen nephrology.  She was seen by Dr. Michail Sermon of gastroenterology.  He did a CT of her abdomen pelvis.  This was relatively unrevealing for any obvious malignancy.  She went to the emergency room and was admitted a few days ago.  She had a CT scan of the chest which showed a large right pleural effusion.  She underwent a thoracentesis on 09/20/2018.  The pathology report on the thoracentesis did not show any obvious malignant cells.  She is a smoker.  She probably has about a 50-pack-year history of tobacco use.  She is quite anemic.  She is not bleeding.  When she came to the hospital, her white count 4.1.  Hemoglobin 9 and platelet count 199,000.  Today, her white cell count is 2.9.  Hemoglobin 7.5 platelet count 170,000.  MCV is 97.  Her corrected reticulocyte count is probably about 1.2.  She has a markedly elevated sed rate of 117.  She did have some marginal iron studies.  On 09/20/2018, her ferritin was 288 with an iron saturation of 15%.  She had mildly elevated TSH on 09/20/2018 of 5.1.  She has been seen by cardiology.  There is no blood smear yet for me to look at.  She has had no fever.  She is not a vegetarian.  She had a vitamin B12 level of 600 on 09/20/2018.  She has a elevated LDH of 268.  She has a markedly elevated beta natruretic peptide  of 608.  She had an echocardiogram done on 09/20/2018.  Results are still pending.  She has had no sweats.  She has not noted any swollen lymph nodes.  She has had no change in bowel or bladder habits.  Overall, I would say her performance status is ECOG 2.     Past Medical History:  Diagnosis Date  . Acute kidney injury ([place])    a. 09/2018  . Anxiety   . Aortic atherosclerosis ([place])    a. 09/2018 noted on Chest CT.  Marland Kitchen Chronic combined systolic (congestive) and diastolic (congestive) heart failure ([place])    a. 09/2018 Echo: EF 45-50%, diff HK. Triv to small circumfirential pericardial eff.  . Chronic DOE (dyspnea on exertion)   . Claudication [facility])    a. Bilat hip claudication since ~ 2019.  Marland Kitchen Depression   . Emphysema lung ([place])   . Exertional angina ([facility])    a. Ex angina since ~ 2019.  Marland Kitchen GERD (gastroesophageal reflux disease)   . Hiatal hernia   . History of bronchitis   . History of kidney stones   . Hypothyroidism   . Pleural effusion    a. 09/2018 s/p thoracentesis-->953m.  .Marland KitchenResting tremor   . Tobacco abuse   :  Past Surgical History:  Procedure Laterality Date  .  BREAST LUMPECTOMY WITH RADIOACTIVE SEED LOCALIZATION Left 12/28/2017   Procedure: BREAST LUMPECTOMY WITH RADIOACTIVE SEED LOCALIZATION;  Surgeon: Jovita Kussmaul, MD;  Location: [facility];  Service: General;  Laterality: Left;  . DG THUMB RIGHT HAND ([place] [facility])    . IR THORACENTESIS ASP PLEURAL SPACE W/IMG GUIDE  09/20/2018  . KIDNEY STONE SURGERY    :   Current Facility-Administered Medications:  .  acetaminophen (TYLENOL) tablet 650 mg, 650 mg, Oral, Q6H PRN, 650 mg at 09/22/18 0534 **OR** acetaminophen (TYLENOL) suppository 650 mg, 650 mg, Rectal, Q6H PRN, Smith, Rondell A, MD .  albuterol (PROVENTIL) (2.5 MG/3ML) 0.083% nebulizer solution 2.5 mg, 2.5 mg, Nebulization, Q6H PRN, Smith, Rondell A, MD .  amLODipine (NORVASC) tablet 5 mg, 5 mg, Oral, Daily, Hilty, Nadean Corwin, MD, 5 mg at 09/22/18 0911 .  aspirin  chewable tablet 81 mg, 81 mg, Oral, Daily, Theora Gianotti, NP, 81 mg at 09/22/18 0911 .  atorvastatin (LIPITOR) tablet 40 mg, 40 mg, Oral, q1800, Fuller Plan A, MD, 40 mg at 09/22/18 1712 .  carvedilol (COREG) tablet 6.25 mg, 6.25 mg, Oral, BID WC, Hilty, Nadean Corwin, MD, 6.25 mg at 09/22/18 1712 .  clonazePAM (KLONOPIN) tablet 0.5 mg, 0.5 mg, Oral, BID PRN, Bodenheimer, Charles A, NP, 0.5 mg at 09/22/18 1722 .  clotrimazole (LOTRIMIN) 1 % cream, , Topical, BID, Smith, Rondell A, MD .  ferrous sulfate tablet 325 mg, 325 mg, Oral, Q breakfast, Georgette Shell, MD, 325 mg at 09/22/18 0911 .  folic acid (FOLVITE) tablet 1 mg, 1 mg, Oral, Daily, Smith, Rondell A, MD, 1 mg at 09/22/18 0911 .  furosemide (LASIX) injection 40 mg, 40 mg, Intravenous, BID, Theora Gianotti, NP, 40 mg at 09/22/18 1714 .  hydrALAZINE (APRESOLINE) injection 10 mg, 10 mg, Intravenous, Q4H PRN, Fuller Plan A, MD, 10 mg at 09/20/18 1236 .  levothyroxine (SYNTHROID) tablet 75 mcg, 75 mcg, Oral, Daily, Fuller Plan A, MD, 75 mcg at 09/22/18 0534 .  nicotine (NICODERM CQ - dosed in mg/24 hours) patch 21 mg, 21 mg, Transdermal, Daily, Tamala Julian, Rondell A, MD, 21 mg at 09/22/18 0911 .  ondansetron (ZOFRAN) tablet 4 mg, 4 mg, Oral, Q6H PRN **OR** ondansetron (ZOFRAN) injection 4 mg, 4 mg, Intravenous, Q6H PRN, Smith, Rondell A, MD .  pantoprazole (PROTONIX) injection 40 mg, 40 mg, Intravenous, Q12H, Smith, Rondell A, MD, 40 mg at 09/22/18 0904 .  PARoxetine (PAXIL) tablet 60 mg, 60 mg, Oral, QHS, Smith, Rondell A, MD, 60 mg at 09/21/18 2119 .  potassium chloride SA (K-DUR) CR tablet 20 mEq, 20 mEq, Oral, Daily, Theora Gianotti, NP, 20 mEq at 09/22/18 0911 .  sodium chloride flush (NS) 0.9 % injection 3 mL, 3 mL, Intravenous, Q12H, Smith, Rondell A, MD, 3 mL at 09/22/18 0912 .  traZODone (DESYREL) tablet 50-100 mg, 50-100 mg, Oral, QHS, Smith, Rondell A, MD, 100 mg at 09/21/18 2119:  . amLODipine  5  mg Oral Daily  . aspirin  81 mg Oral Daily  . atorvastatin  40 mg Oral q1800  . carvedilol  6.25 mg Oral BID WC  . clotrimazole   Topical BID  . ferrous sulfate  325 mg Oral Q breakfast  . folic acid  1 mg Oral Daily  . furosemide  40 mg Intravenous BID  . levothyroxine  75 mcg Oral Daily  . nicotine  21 mg Transdermal Daily  . pantoprazole (PROTONIX) IV  40 mg Intravenous Q12H  . PARoxetine  60  mg Oral QHS  . potassium chloride  20 mEq Oral Daily  . sodium chloride flush  3 mL Intravenous Q12H  . traZODone  50-100 mg Oral QHS  :  Allergies  Allergen Reactions  . Codeine Nausea And Vomiting  :  Family History  Problem Relation Age of Onset  . Stroke Mother        Mini-strokes. Died @ 65.  . Dementia Mother   . Lung cancer Father        Died in his 73's  . Addison's disease Sister   . Hypertension Brother   . CAD Brother   . Hypertension Brother   :  Social History   Socioeconomic History  . Marital status: Married    Spouse name: Not on file  . Number of children: Not on file  . Years of education: Not on file  . Highest education level: Not on file  Occupational History  . Not on file  Social Needs  . Financial resource strain: Not on file  . Food insecurity:    Worry: Not on file    Inability: Not on file  . Transportation needs:    Medical: Not on file    Non-medical: Not on file  Tobacco Use  . Smoking status: Current Every Day Smoker    Packs/day: 0.75    Years: 40.00    Pack years: 30.00  . Smokeless tobacco: Never Used  . Tobacco comment: smoking 15 cigarettes/day  Substance and Sexual Activity  . Alcohol use: Yes    Comment: 1-2 gl wine / night - none since 07/2018  . Drug use: Not Currently    Comment: prev used drugs ~ 35 yrs ago.  Marland Kitchen Sexual activity: Not on file  Lifestyle  . Physical activity:    Days per week: Not on file    Minutes per session: Not on file  . Stress: Not on file  Relationships  . Social connections:    Talks on  phone: Not on file    Gets together: Not on file    Attends religious service: Not on file    Active member of club or organization: Not on file    Attends meetings of clubs or organizations: Not on file    Relationship status: Not on file  . Intimate partner violence:    Fear of current or ex partner: Not on file    Emotionally abused: Not on file    Physically abused: Not on file    Forced sexual activity: Not on file  Other Topics Concern  . Not on file  Social History Narrative   Lives in [place] with her husband.  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  :  Review of Systems  Constitutional: Positive for malaise/fatigue.  HENT: Negative.   Eyes: Positive for blurred vision.  Respiratory: Positive for shortness of breath.   Cardiovascular: Positive for palpitations and leg swelling.  Gastrointestinal: Positive for abdominal pain and nausea.  Genitourinary: Negative.   Musculoskeletal: Positive for joint pain and myalgias.  Skin: Negative.   Neurological: Negative.   Endo/Heme/Allergies: Negative.   Psychiatric/Behavioral: Negative.      Exam: As above Patient Vitals for the past 24 hrs:  BP Temp Temp src Pulse Resp SpO2  09/22/18 1700 (!) 165/110 98.5 F (36.9 C) Oral 91 18 97 %  09/22/18 0934 (!) 150/90 (!) 97.4 F (36.3 C) Oral 97 18 99 %  09/21/18 2354 138/76 98.1 F (36.7 C) Oral 85 19  96 %     Recent Labs    09/21/18 0651 09/22/18 0527  WBC 3.0* 2.9*  HGB 8.0* 7.5*  HCT 24.1* 22.8*  PLT 167 170   Recent Labs    09/21/18 0651 09/22/18 0527  NA 141 143  K 3.7 3.7  CL 111 113*  CO2 20* 18*  GLUCOSE 95 89  BUN 29* 30*  CREATININE 1.67* 1.70*  CALCIUM 8.5* 8.5*    Blood smear review: None  Pathology: None    Assessment and Plan: TiffanyPlourde is a very nice 68 year old white female.  She clearly has some issues going on with respect to her blood.  She has a markedly elevated sed rate.  I am not sure if she is gotten iron yet.  I think  she definitely would benefit from some iron.  I cannot find anything on her examination that would suggest an issue.  Given that she has the elevated sed rate, one would have to suspect some type of chronic inflammatory condition that she is dealing with.  I will know if there is any kind of collagen vascular disease that she might have.  I suspect that her erythropoietin level is going to be on the low side.  I suspect that the edema in her legs is probably from her being anemic.  I do think that it is wise to make sure she does not have myeloma or some type of plasma cell disorder.  She has been tested for this.  I would not do a bone survey on her as of yet.  Her CAT scans that she had done really we have shown myelomatous lesions if she had them.  She had the pleural fluid taken out of the right lung.  This did not show any cancer.  Ultimately, she may need to have a bone marrow biopsy done to see what might be going on.  Unfortunately, there is no blood smear for me to look at.  I do see 1 cremate over the weekend.  If her hemoglobin drops further, I probably would have to transfuse her.  This is very interesting.  I spent a good 45 minutes with her.  She has a strong faith.  We did have a good prayer session as she wanted me to pray for her.  I appreciate everybody's help.  We will follow along closely and try to help her out so we get her blood count optimized.  I probably would give her some IV iron.  I think her iron levels are on the low side given her iron saturation of only 15%.   Tiffany Haw, MD  1 Peter 5:2

## 2018-09-22 NOTE — Progress Notes (Signed)
PROGRESS NOTE    Tiffany Leon  WUJ:[id number] DOB: [birth year] DOA: 09/19/2018 PCP: Aretta Nip, MD   Brief Narrative:68 y.o.femalewith medical history significant ofemphysema, hypothyroidism, anxiety, depression, nephrolithiasis, and GERD; who presented with complaints of nausea, vomiting, and generalized weakness over the last 2 months. She states that emesis is nonbloody and she is able to keep small amount of food and liquids down. Notes that she just has poor appetite and despite this is had increasing her weight approximately 15 pounds over the last 1 to 2 months. In the last 3weeks the diarrhea symptoms have eased off. Couple weeks ago she reported being increased on her dose of levothyroxine from 50 mcg to 75 mcg/day. Her primary physician had referred her to Dr. Michail Sermon gastroenterology who had ordered ultrasound and CAT scan of the abdomen and pelvis. Ultrasound of the abdomen on 4/29, was noted to be within normal limits. CTon 5/15 noted, bilateral nephrolithiasis with a6 mm calculus in the right renal pelvis without signs of hydronephrosis,and small pleural effusion with bilateral atelectasis. Her primary care provider had plan to refer her to [facility] for further work-up. Associated symptoms include shortness of breath, coughing, leg swelling with rash of bilateral feet that is itchy at night, wheezing at night, anxiety with panic attacks, and orthopnea. She is not on oxygen or inhalers at home. Patient admits to smoking 3/4 pack of cigarettes per day on average and has done so since the age of 12. Denies having any significant fever, sick contacts, loss of consciousness, blood in stool, dark stools, abdominal pain, or dysuria symptoms.  ED Course:Upon admission into the emergency department patient seen to be afebrile, pulse 87-104, respirations 16-34, blood pressure elevated up to 182/97, and O2 saturations maintained on room air. Labs revealed WBC 4.1,  hemoglobin 9, BUN 31, creatinine 1.86, iron studies within normal limits, folate 4.3, and troponin i0.07. COVID-19 screening negative. A CT scan of the chest was obtained noting a moderate right-sided pleural effusion and smaller left-sided pleural effusion progressed from previous, bilateral nephrolithiasis with 11 m stone at right JVP junction, and atherosclerosis of the aorta. TRH called to admit Assessment & Plan:   Principal Problem:   Pleural effusion Active Problems:   SOB (shortness of breath)   Hypertensive urgency   AKI (acute kidney injury) (HCC)   Nausea and vomiting   Hypothyroidism   Tobacco abuse   Tinea pedis   COPD (chronic obstructive pulmonary disease) (HCC)   Nephrolithiasis   #1 bilateral pleural effusion right more than left/diastolic CHF status post thoracentesis on the right removed 900 cc of fluid.  Mildly elevated LDH in the fluid with mesothelial cells present.  Patient presented with increased weight gain lower extremity edema and dyspnea on exertion with shortness of breath.  BNP on admission was 680 she is being continued on IV diuretics.  She still continues to feel short of breath dizziness improved short of breath with any movement or activity.  I do not see any evidence of infection or need for antibiotics at this time.  A CT scan of the chest abdomen and pelvis without contrast showed right pleural effusion with small pericardial effusion.  There was no other acute changes noted in the CT scan.  No evidence of malignancy reported by CT scan she is in 99% on room air.  Cardiology would like to monitor her in hospital for continued IV diuresis..  Pleural fluid culture negative Gram stain shows abundant WBC predominantly mononuclear no organisms.  I  will add blood culture x2.  #2 anemia with no evidence of active bleeding and negative FOBT with low iron and low folic acid.  With ongoing diuresis her hemoglobin should be higher to hemo-concentration but her  hemoglobin have been trending down.   #3 elevated inflammatory markers with elevated CRP d-dimer sed rate LDH COVID negative.  She has no cough or fever.  I have ordered SPEP UPEP rheumatoid factor and ANCA.  #4 hypertensive urgency improved with Coreg and Norvasc  #5 AKI monitor on IV diuresis creatinine trending up.  #6 hypothyroidism continue current Synthroid dose.  #7 COPD stable  #8 hyperlipidemia continue statin which has been started in the hospital.  #9 kidney stones asymptomatic at this time follow-up with urology.  #10 chronic anxiety and depression continue Paxil and Klonopin     Estimated body mass index is 23.84 kg/m as calculated from the following:   Height as of this encounter: 5\' 6"  (1.676 m).   Weight as of this encounter: 67 kg.    Subjective:  Still sob dizzy better still has lower extremity edema Objective: Vitals:   09/21/18 0900 09/21/18 1632 09/21/18 2354 09/22/18 0934  BP: 137/89 (!) 155/85 138/76 (!) 150/90  Pulse: 92 90 85 97  Resp:  18 19 18   Temp:  98.3 F (36.8 C) 98.1 F (36.7 C) (!) 97.4 F (36.3 C)  TempSrc:  Oral Oral Oral  SpO2:  97% 96% 99%  Weight:      Height:        Intake/Output Summary (Last 24 hours) at 09/22/2018 1317 Last data filed at 09/22/2018 1100 Gross per 24 hour  Intake 720 ml  Output 1300 ml  Net -580 ml   Filed Weights   09/20/18 1652 09/21/18 0600  Weight: 66.1 kg 67 kg    Examination:  General exam: Appears calm and comfortable  Respiratory system: Diminished breath sounds at the bases to auscultation. Respiratory effort normal. Cardiovascular system: S1 & S2 heard, RRR. No JVD, murmurs, rubs, gallops or clicks. No pedal edema. Gastrointestinal system: Abdomen is nondistended, soft and nontender. No organomegaly or masses felt. Normal bowel sounds heard. Central nervous system: Alert and oriented. No focal neurological deficits. Extremities: Symmetric 5 x 5 power. Skin: No rashes, lesions or  ulcers Psychiatry: Judgement and insight appear normal. Mood & affect appropriate.     Data Reviewed: I have personally reviewed following labs and imaging studies  CBC: Recent Labs  Lab 09/19/18 1858 09/20/18 1010 09/21/18 0651 09/22/18 0527  WBC 4.1  --  3.0* 2.9*  NEUTROABS  --   --   --  1.3*  HGB 9.0* 8.3* 8.0* 7.5*  HCT 26.3* 25.2* 24.1* 22.8*  MCV 96.0  --  96.0 97.4  PLT 199  --  167 767   Basic Metabolic Panel: Recent Labs  Lab 09/19/18 1858 09/20/18 1200 09/21/18 0651 09/22/18 0527  NA 141 141 141 143  K 3.6 3.7 3.7 3.7  CL 110 112* 111 113*  CO2 19* 21* 20* 18*  GLUCOSE 104* 91 95 89  BUN 31* 32* 29* 30*  CREATININE 1.86* 1.70* 1.67* 1.70*  CALCIUM 8.7* 8.5* 8.5* 8.5*   GFR: Estimated Creatinine Clearance: 28.8 mL/min (A) (by C-G formula based on SCr of 1.7 mg/dL (H)). Liver Function Tests: Recent Labs  Lab 09/20/18 0334 09/22/18 0527  AST 16 12*  ALT 12 11  ALKPHOS 69 51  BILITOT 0.7 0.7  PROT 6.7 5.5*  ALBUMIN 3.0* 2.5*   No  results for input(s): LIPASE, AMYLASE in the last 168 hours. No results for input(s): AMMONIA in the last 168 hours. Coagulation Profile: No results for input(s): INR, PROTIME in the last 168 hours. Cardiac Enzymes: Recent Labs  Lab 09/20/18 1559  TROPONINI 0.05*   BNP (last 3 results) No results for input(s): PROBNP in the last 8760 hours. HbA1C: No results for input(s): HGBA1C in the last 72 hours. CBG: No results for input(s): GLUCAP in the last 168 hours. Lipid Profile: Recent Labs    09/20/18 1200  CHOL 186  HDL 31*  LDLCALC 131*  TRIG 120  CHOLHDL 6.0   Thyroid Function Tests: Recent Labs    09/20/18 1200  TSH 5.145*   Anemia Panel: Recent Labs    09/20/18 0334  VITAMINB12 595  FOLATE 4.3*  FERRITIN 288  TIBC 258  IRON 38  RETICCTPCT 2.4   Sepsis Labs: Recent Labs  Lab 09/20/18 1010  PROCALCITON <0.10    Recent Results (from the past 240 hour(s))  SARS Coronavirus 2 (CEPHEID -  Performed in [facility] hospital lab), Hosp Order     Status: None   Collection Time: 09/20/18  2:55 AM  Result Value Ref Range Status   SARS Coronavirus 2 NEGATIVE NEGATIVE Final    Comment: (NOTE) If result is NEGATIVE SARS-CoV-2 target nucleic acids are NOT DETECTED. The SARS-CoV-2 RNA is generally detectable in upper and lower  respiratory specimens during the acute phase of infection. The lowest  concentration of SARS-CoV-2 viral copies this assay can detect is 250  copies / mL. A negative result does not preclude SARS-CoV-2 infection  and should not be used as the sole basis for treatment or other  patient management decisions.  A negative result may occur with  improper specimen collection / handling, submission of specimen other  than nasopharyngeal swab, presence of viral mutation(s) within the  areas targeted by this assay, and inadequate number of viral copies  (<250 copies / mL). A negative result must be combined with clinical  observations, patient history, and epidemiological information. If result is POSITIVE SARS-CoV-2 target nucleic acids are DETECTED. The SARS-CoV-2 RNA is generally detectable in upper and lower  respiratory specimens dur ing the acute phase of infection.  Positive  results are indicative of active infection with SARS-CoV-2.  Clinical  correlation with patient history and other diagnostic information is  necessary to determine patient infection status.  Positive results do  not rule out bacterial infection or co-infection with other viruses. If result is PRESUMPTIVE POSTIVE SARS-CoV-2 nucleic acids MAY BE PRESENT.   A presumptive positive result was obtained on the submitted specimen  and confirmed on repeat testing.  While 2019 novel coronavirus  (SARS-CoV-2) nucleic acids may be present in the submitted sample  additional confirmatory testing may be necessary for epidemiological  and / or clinical management purposes  to differentiate between   SARS-CoV-2 and other Sarbecovirus currently known to infect humans.  If clinically indicated additional testing with an alternate test  methodology [phone]) is advised. The SARS-CoV-2 RNA is generally  detectable in upper and lower respiratory sp ecimens during the acute  phase of infection. The expected result is Negative. Fact Sheet for Patients:  StrictlyIdeas.no Fact Sheet for Healthcare Providers: BankingDealers.co.za This test is not yet approved or cleared by the [place] FDA and has been authorized for detection and/or diagnosis of SARS-CoV-2 by FDA under an Emergency Use Authorization (EUA).  This EUA will remain in effect (meaning this test can  be used) for the duration of the COVID-19 declaration under Section 564(b)(1) of the Act, 21 U.S.C. section 360bbb-3(b)(1), unless the authorization is terminated or revoked sooner. Performed at [facility], [place] [address], [place], [place] [address]   Culture, body fluid-bottle     Status: None (Preliminary result)   Collection Time: 09/20/18  1:23 PM  Result Value Ref Range Status   Specimen Description PLEURAL RIGHT  Final   Special Requests NONE  Final   Culture   Final    NO GROWTH 2 DAYS Performed at [facility], [place] [address], [place], [place] [address]    Report Status PENDING  Incomplete  Gram stain     Status: None   Collection Time: 09/20/18  1:23 PM  Result Value Ref Range Status   Specimen Description PLEURAL RIGHT  Final   Special Requests NONE  Final   Gram Stain   Final    ABUNDANT WBC PRESENT, PREDOMINANTLY MONONUCLEAR NO ORGANISMS SEEN Performed at [facility], [address], [place], [place] [address]    Report Status 09/20/2018 FINAL  Final         Radiology Studies: Dg Chest 1 View  Result Date: 09/20/2018 CLINICAL DATA:  Initial evaluation for pleural effusion. Status post right thoracentesis. EXAM: CHEST  1 VIEW  COMPARISON:  Prior CT from earlier the same day. FINDINGS: Cardiac and mediastinal silhouettes are stable in size and contour, and remain within normal limits. Aortic atherosclerosis. Lungs well inflated. No pneumothorax seen status post thoracentesis. Small residual right-sided pleural effusion, decreased from previous. Small amount of trapped fluid noted along the right minor fissure. Improved aeration at the right lung base. No new focal airspace disease. No pulmonary edema. No acute osseous finding. IMPRESSION: 1. Small residual right-sided pleural effusion status post thoracentesis, decreased from previous. No pneumothorax. 2. Improved aeration at the right lung base. 3. Aortic atherosclerosis. Electronically Signed   By: Jeannine Boga M.D.   On: 09/20/2018 13:45   Ir Thoracentesis Asp Pleural Space W/img Guide  Result Date: 09/20/2018 INDICATION: Patient with history of dyspnea, pericardial effusion, and right pleural effusion. Request is made for diagnostic and therapeutic right thoracentesis. EXAM: ULTRASOUND GUIDED DIAGNOSTIC AND THERAPEUTIC RIGHT THORACENTESIS MEDICATIONS: 10 mL 1% lidocaine COMPLICATIONS: None immediate. PROCEDURE: An ultrasound guided thoracentesis was thoroughly discussed with the patient and questions answered. The benefits, risks, alternatives and complications were also discussed. The patient understands and wishes to proceed with the procedure. Written consent was obtained. Ultrasound was performed to localize and mark an adequate pocket of fluid in the right chest. The area was then prepped and draped in the normal sterile fashion. 1% Lidocaine was used for local anesthesia. Under ultrasound guidance a 6 Fr Safe-T-Centesis catheter was introduced. Thoracentesis was performed. The catheter was removed and a dressing applied. FINDINGS: A total of approximately 900 mL of clear yellow fluid was removed. Samples were sent to the laboratory as requested by the clinical team.  IMPRESSION: Successful ultrasound guided right thoracentesis yielding 900 mL of pleural fluid. Read by: Earley Abide, PA-C Electronically Signed   By: Jerilynn Mages.  Shick M.D.   On: 09/20/2018 13:50        Scheduled Meds:  amLODipine  5 mg Oral Daily   aspirin  81 mg Oral Daily   atorvastatin  40 mg Oral q1800   carvedilol  6.25 mg Oral BID WC   clotrimazole   Topical BID   ferrous sulfate  325 mg Oral  Q breakfast   folic acid  1 mg Oral Daily   furosemide  40 mg Intravenous BID   levothyroxine  75 mcg Oral Daily   nicotine  21 mg Transdermal Daily   pantoprazole (PROTONIX) IV  40 mg Intravenous Q12H   PARoxetine  60 mg Oral QHS   potassium chloride  20 mEq Oral Daily   sodium chloride flush  3 mL Intravenous Q12H   traZODone  50-100 mg Oral QHS   Continuous Infusions:   LOS: 1 day     Georgette Shell, MD [facility]  If 7PM-7AM, please contact night-coverage [email or web address] Password [email or web address] 09/22/2018, 1:17 PM

## 2018-09-23 ENCOUNTER — Inpatient Hospital Stay (HOSPITAL_COMMUNITY): Payer: Medicare Other

## 2018-09-23 DIAGNOSIS — I5041 Acute combined systolic (congestive) and diastolic (congestive) heart failure: Secondary | ICD-10-CM

## 2018-09-23 LAB — RETICULOCYTES
Immature Retic Fract: 11 % (ref 2.3–15.9)
RBC.: 2.72 MIL/uL — ABNORMAL LOW (ref 3.87–5.11)
Retic Count, Absolute: 60.9 10*3/uL (ref 19.0–186.0)
Retic Ct Pct: 2.2 % (ref 0.4–3.1)

## 2018-09-23 LAB — SAVE SMEAR(SSMR), FOR PROVIDER SLIDE REVIEW

## 2018-09-23 LAB — RHEUMATOID FACTOR: Rheumatoid fact SerPl-aCnc: >650 IU/mL — ABNORMAL HIGH (ref 0.0–13.9)

## 2018-09-23 LAB — COMPREHENSIVE METABOLIC PANEL
ALT: 15 U/L (ref 0–44)
AST: 14 U/L — ABNORMAL LOW (ref 15–41)
Albumin: 3 g/dL — ABNORMAL LOW (ref 3.5–5.0)
Alkaline Phosphatase: 63 U/L (ref 38–126)
Anion gap: 11 (ref 5–15)
BUN: 28 mg/dL — ABNORMAL HIGH (ref 8–23)
CO2: 23 mmol/L (ref 22–32)
Calcium: 8.9 mg/dL (ref 8.9–10.3)
Chloride: 109 mmol/L (ref 98–111)
Creatinine, Ser: 1.72 mg/dL — ABNORMAL HIGH (ref 0.44–1.00)
GFR calc Af Amer: 34 mL/min — ABNORMAL LOW (ref >60)
GFR calc non Af Amer: 30 mL/min — ABNORMAL LOW (ref >60)
Glucose, Bld: 105 mg/dL — ABNORMAL HIGH (ref 70–99)
Potassium: 3.5 mmol/L (ref 3.5–5.1)
Sodium: 143 mmol/L (ref 135–145)
Total Bilirubin: 0.6 mg/dL (ref 0.3–1.2)
Total Protein: 6.8 g/dL (ref 6.5–8.1)

## 2018-09-23 LAB — CBC
HCT: 26.4 % — ABNORMAL LOW (ref 36.0–46.0)
Hemoglobin: 9 g/dL — ABNORMAL LOW (ref 12.0–15.0)
MCH: 33.1 pg (ref 26.0–34.0)
MCHC: 34.1 g/dL (ref 30.0–36.0)
MCV: 97.1 fL (ref 80.0–100.0)
Platelets: 199 10*3/uL (ref 150–400)
RBC: 2.72 MIL/uL — ABNORMAL LOW (ref 3.87–5.11)
RDW: 13.3 % (ref 11.5–15.5)
WBC: 3.7 10*3/uL — ABNORMAL LOW (ref 4.0–10.5)
nRBC: 0 % (ref 0.0–0.2)

## 2018-09-23 LAB — TYPE AND SCREEN
ABO/RH(D): O POS
Antibody Screen: NEGATIVE

## 2018-09-23 LAB — ANA: Anti Nuclear Antibody (ANA): NEGATIVE

## 2018-09-23 MED ORDER — PANTOPRAZOLE SODIUM 40 MG PO TBEC
40.0000 mg | DELAYED_RELEASE_TABLET | Freq: Two times a day (BID) | ORAL | Status: DC
  Administered 2018-09-23 – 2018-09-24 (×2): 40 mg via ORAL
  Filled 2018-09-23 (×2): qty 1

## 2018-09-23 MED ORDER — AMLODIPINE BESYLATE 5 MG PO TABS
2.5000 mg | ORAL_TABLET | Freq: Every day | ORAL | Status: DC
  Administered 2018-09-23: 2.5 mg via ORAL
  Filled 2018-09-23: qty 1

## 2018-09-23 MED ORDER — ISOSORB DINITRATE-HYDRALAZINE 20-37.5 MG PO TABS
1.0000 | ORAL_TABLET | Freq: Three times a day (TID) | ORAL | Status: DC
  Administered 2018-09-23 – 2018-09-24 (×4): 1 via ORAL
  Filled 2018-09-23 (×4): qty 1

## 2018-09-23 NOTE — Progress Notes (Signed)
Progress Note  Patient Name: Tiffany Leon Date of Encounter: 09/23/2018  Primary Cardiologist: Lyman Bishop, MD  Subjective   Complains of headache earlier this morning, feeling of nausea.  No visual changes.  No chest pain or palpitations.  Breathing perhaps somewhat better.  Inpatient Medications    Scheduled Meds: . amLODipine  5 mg Oral Daily  . aspirin  81 mg Oral Daily  . atorvastatin  40 mg Oral q1800  . carvedilol  6.25 mg Oral BID WC  . clotrimazole   Topical BID  . ferrous sulfate  325 mg Oral Q breakfast  . folic acid  1 mg Oral Daily  . furosemide  40 mg Intravenous BID  . levothyroxine  75 mcg Oral Daily  . nicotine  21 mg Transdermal Daily  . pantoprazole (PROTONIX) IV  40 mg Intravenous Q12H  . PARoxetine  60 mg Oral QHS  . potassium chloride  20 mEq Oral Daily  . sodium chloride flush  3 mL Intravenous Q12H  . traZODone  50-100 mg Oral QHS    PRN Meds: acetaminophen **OR** acetaminophen, albuterol, clonazePAM, hydrALAZINE, ondansetron **OR** ondansetron (ZOFRAN) IV   Vital Signs    Vitals:   09/21/18 2354 09/22/18 0934 09/22/18 1700 09/22/18 2301  BP: 138/76 (!) 150/90 (!) 165/110 (!) 158/86  Pulse: 85 97 91 82  Resp: _0 Temp: 98.1 F (36.7 C) (!) 97.4 F (36.3 C) 98.5 F (36.9 C) 98.4 F (36.9 C)  TempSrc: Oral Oral Oral Oral  SpO2: 96% 99% 97% 95%  Weight:      Height:        Intake/Output Summary (Last 24 hours) at 09/23/2018 0805 Last data filed at 09/23/2018 0600 Gross per 24 hour  Intake 759.65 ml  Output 1700 ml  Net -940.35 ml   Filed Weights   09/20/18 1652 09/21/18 0600  Weight: 66.1 kg 67 kg    Telemetry    Sinus rhythm.  Personally reviewed.  ECG    Tracing from 09/19/2018 shows sinus tachycardia with poor R wave progression, rule out anterior infarct pattern.  Personally reviewed.  Physical Exam   GEN: No acute distress.   Neck: No JVD. Cardiac: RRR, soft systolic murmur, no rub or gallop.  Respiratory:  Nonlabored.  Decreased breath sounds at bases with scattered rhonchi.  No wheezing. GI: Soft, nontender, bowel sounds present. MS:  Trace to mild ankle edema; No deformity. Neuro:  Nonfocal. Psych: Alert and oriented x 3. Normal affect.  Labs    Chemistry Recent Labs  Lab 09/20/18 [phone]  09/21/18 0932 09/22/18 0527 09/23/18 0437  NA  --    < > 141 143 143  K  --    < > 3.7 3.7 3.5  CL  --    < > 111 113* 109  CO2  --    < > 20* 18* 23  GLUCOSE  --    < > 95 89 105*  BUN  --    < > 29* 30* 28*  CREATININE  --    < > 1.67* 1.70* 1.72*  CALCIUM  --    < > 8.5* 8.5* 8.9  PROT 6.7  --   --  5.5* 6.8  ALBUMIN 3.0*  --   --  2.5* 3.0*  AST 16  --   --  12* 14*  ALT 12  --   --  11 15  ALKPHOS 69  --   --  51 63  BILITOT 0.7  --   --  0.7 0.6  GFRNONAA  --    < > 31* 30* 30*  GFRAA  --    < > 36* 35* 34*  ANIONGAP  --    < > _0 < > = values in this interval not displayed.     Hematology Recent Labs  Lab 09/21/18 0651 09/22/18 0527 09/23/18 0437  WBC 3.0* 2.9* 3.7*  RBC 2.51* 2.34* 2.72*  2.72*  HGB 8.0* 7.5* 9.0*  HCT 24.1* 22.8* 26.4*  MCV 96.0 97.4 97.1  MCH 31.9 32.1 33.1  MCHC 33.2 32.9 34.1  RDW 13.2 13.2 13.3  PLT 167 170 199    Cardiac Enzymes Recent Labs  Lab 09/20/18 1559  TROPONINI 0.05*    Recent Labs  Lab 09/19/18 1906 09/20/18 0348  TROPIPOC 0.07 0.06     BNP Recent Labs  Lab 09/20/18 1200  BNP 608.1*     Radiology    No results found.  Cardiac Studies   Echocardiogram 09/20/2018:  1. 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 The cavity size was normal. Left ventricular diastolic function could not be evaluated secondary to atrial fibrillation. Elevated left ventricular  end-diastolic pressure Left ventricular diffuse hypokinesis.  2. The right ventricle has normal systolic function. The cavity was normal. There is no increase in right ventricular wall thickness. Right  ventricular systolic pressure could not be assessed  3. Trivial to small circumferential pericardial effusion is present with prominent fat pad over the RV.  4. The aortic valve was not well visualized. Aortic valve regurgitation was not assessed by color flow Doppler.  5. The inferior vena cava was normal in size with <50% respiratory variability.  6. The interatrial septum appears to be lipomatous.  Patient Profile     68 y.o. female with a history of tobacco abuse and COPD, GERD, depression, hypothyroidism, and anxiety now presenting with worsening shortness of breath as well as intermittent nausea and emesis with relative anorexia, symptoms noted over the last few months.  She has been diagnosed with a cardiomyopathy, pleural effusion and pericardial effusion present, also incidentally noted coronary artery calcifications by CT and significantly elevated ESR.  Work-up ongoing.  Assessment & Plan    1.  Acute combined heart failure, LVEF approximately 45%, diffuse hypokinesis, and elevated filling pressures by recent echocardiogram.  Troponin I of 0.05 is not suggestive of ACS.  Diuresing on IV Lasix with approximately 1000 cc out more than in last 24 hours.  2.  Coronary and aortic atherosclerosis by recent CT imaging.  3.  Pleural effusions right greater than left and small pericardial effusion.  Patient status post thoracentesis revealing transudate.  Culture negative.  Monocyte-macrophage predominant cells.  4.  Elevated ESR of 117.  She has been seen by Hematology-Oncology with work-up ongoing.  She is afebrile, cultures negative, no obvious malignancy by chest CT.  5.  Hypertensive urgency, blood pressure trend improving.   6.  Acute renal failure, creatinine 1.72.  Current cardiac regimen includes aspirin, Norvasc, Lipitor, Coreg, and Lasix at 40 mg IV twice daily as well as potassium supplements.  Would not institute ARB/ACE inhibitor at this time with acute renal insufficiency.   Will initiate low-dose hydralazine/nitrate combination in the form of BiDil and concurrently reduce Norvasc dose, might be able to stop Norvasc eventually depending on blood pressure control.  Continue current diuretic plan with relatively stable creatinine over the  last 48 hours.  Continue ongoing work-up of significantly increased ESR and inflammatory markers.  Do not anticipate inpatient ischemic cardiac testing or work-up of PAD at this point.  Signed, Rozann Lesches, MD  09/23/2018, 8:05 AM

## 2018-09-23 NOTE — Progress Notes (Signed)
PROGRESS NOTE    Tiffany Leon  BTD:[id number] DOB: 12-27-1950 DOA: 09/19/2018 PCP: Aretta Nip, MD   Brief Narrative:68 y.o.femalewith medical history significant ofemphysema, hypothyroidism, anxiety, depression, nephrolithiasis, and GERD; who presented with complaints of nausea, vomiting, and generalized weakness over the last 2 months. She states that emesis is nonbloody and she is able to keep small amount of food and liquids down. Notes that she just has poor appetite and despite this is had increasing her weight approximately 15 pounds over the last 1 to 2 months. In the last 3weeks the diarrhea symptoms have eased off. Couple weeks ago she reported being increased on her dose of levothyroxine from 50 mcg to 75 mcg/day. Her primary physician had referred her to Dr. Michail Sermon gastroenterology who had ordered ultrasound and CAT scan of the abdomen and pelvis. Ultrasound of the abdomen on 4/29, was noted to be within normal limits. CTon 5/15 noted, bilateral nephrolithiasis with a6 mm calculus in the right renal pelvis without signs of hydronephrosis,and small pleural effusion with bilateral atelectasis. Her primary care provider had plan to refer her to [facility] for further work-up. Associated symptoms include shortness of breath, coughing, leg swelling with rash of bilateral feet that is itchy at night, wheezing at night, anxiety with panic attacks, and orthopnea. She is not on oxygen or inhalers at home. Patient admits to smoking 3/4 pack of cigarettes per day on average and has done so since the age of 42. Denies having any significant fever, sick contacts, loss of consciousness, blood in stool, dark stools, abdominal pain, or dysuria symptoms.  ED Course:Upon admission into the emergency department patient seen to be afebrile, pulse 87-104, respirations 16-34, blood pressure elevated up to 182/97, and O2 saturations maintained on room air. Labs revealed WBC 4.1,  hemoglobin 9, BUN 31, creatinine 1.86, iron studies within normal limits, folate 4.3, and troponin i0.07. COVID-19 screening negative. A CT scan of the chest was obtained noting a moderate right-sided pleural effusion and smaller left-sided pleural effusion progressed from previous, bilateral nephrolithiasis with 11 m stone at right JVP junction, and atherosclerosis of the aorta. TRH called to admit Assessment & Plan:   Principal Problem:   Pleural effusion Active Problems:   SOB (shortness of breath)   Hypertensive urgency   AKI (acute kidney injury) (HCC)   Nausea and vomiting   Hypothyroidism   Tobacco abuse   Tinea pedis   COPD (chronic obstructive pulmonary disease) (HCC)   Nephrolithiasis    #1 bilateral pleural effusion right more than left/diastolic CHF status post thoracentesis on the right removed 900 cc of fluid.  Mildly elevated LDH in the fluid with mesothelial cells present.  Patient presented with increased weight gain lower extremity edema and dyspnea on exertion with shortness of breath.  BNP on admission was 680 she is being continued on IV diuretics.  She still continues to feel short of breath dizziness improved short of breath with any movement or activity.  I do not see any evidence of infection or need for antibiotics at this time.  A CT scan of the chest abdomen and pelvis without contrast showed right pleural effusion with small pericardial effusion.  There was no other acute changes noted in the CT scan.  No evidence of malignancy reported by CT scan she is in 99% on room air.  Cardiology would like to monitor her in hospital for continued IV diuresis..  Pleural fluid culture negative Gram stain shows abundant WBC predominantly mononuclear no organisms.  Pleural pleural fluid culture no growth.  However rheumatoid factor is very high about 600.  I will order citrulline to confirm.  She needs outpatient work-up by a rheumatologist.  Follow-up chest x-ray that was done  today.  #2 anemia with no evidence of active bleeding and negative FOBT with low iron and low folic acid.  Her hemoglobin is actually better without transfusion which makes me think yesterday's hemoglobin was spuriously.   #3 elevated inflammatory markers with elevated CRP d-dimer sed rate LDH COVID negative.  She has no cough or fever.  Rheumatoid factor very high above 600.  SPEP and UPEP ANCA pending.?  Underlying connective tissue disorder.  #4 hypertensive urgency improved with Coreg and Norvasc  #5 AKI monitor on IV diuresis creatinine trending up.  #6 hypothyroidism continue current Synthroid dose.  #7 COPD stable  #8Hypoalbumenemia-?nutritional   Estimated body mass index is 23.84 kg/m as calculated from the following:   Height as of this encounter: 5\' 6"  (1.676 m).   Weight as of this encounter: 67 kg.   Subjective:  Patient is resting in bed however she feels much worse than yesterday she feels more short of breath and lightheaded.had poor appetitie at home  Objective: Vitals:   09/22/18 0934 09/22/18 1700 09/22/18 2301 09/23/18 0930  BP: (!) 150/90 (!) 165/110 (!) 158/86 (!) 141/92  Pulse: 97 91 82 93  Resp: 18 18  16   Temp: (!) 97.4 F (36.3 C) 98.5 F (36.9 C) 98.4 F (36.9 C) (!) 97.5 F (36.4 C)  TempSrc: Oral Oral Oral Oral  SpO2: 99% 97% 95% 96%  Weight:      Height:        Intake/Output Summary (Last 24 hours) at 09/23/2018 1135 Last data filed at 09/23/2018 0600 Gross per 24 hour  Intake 399.65 ml  Output 1400 ml  Net -1000.35 ml   Filed Weights   09/20/18 1652 09/21/18 0600  Weight: 66.1 kg 67 kg    Examination:  General exam: Appears calm and comfortable  Respiratory system: Diminished breath sounds at the bases to auscultation. Respiratory effort normal. Cardiovascular system: S1 & S2 heard, RRR. No JVD, murmurs, rubs, gallops or clicks. No pedal edema. Gastrointestinal system: Abdomen is nondistended, soft and nontender. No  organomegaly or masses felt. Normal bowel sounds heard. Central nervous system: Alert and oriented. No focal neurological deficits. Extremities: Symmetric 5 x 5 power. Skin: No rashes, lesions or ulcers Psychiatry: Judgement and insight appear normal. Mood & affect appropriate.     Data Reviewed: I have personally reviewed following labs and imaging studies  CBC: Recent Labs  Lab 09/19/18 1858 09/20/18 1010 09/21/18 0651 09/22/18 0527 09/23/18 0437  WBC 4.1  --  3.0* 2.9* 3.7*  NEUTROABS  --   --   --  1.3*  --   HGB 9.0* 8.3* 8.0* 7.5* 9.0*  HCT 26.3* 25.2* 24.1* 22.8* 26.4*  MCV 96.0  --  96.0 97.4 97.1  PLT 199  --  167 170 284   Basic Metabolic Panel: Recent Labs  Lab 09/19/18 1858 09/20/18 1200 09/21/18 0651 09/22/18 0527 09/23/18 0437  NA 141 141 141 143 143  K 3.6 3.7 3.7 3.7 3.5  CL 110 112* 111 113* 109  CO2 19* 21* 20* 18* 23  GLUCOSE 104* 91 95 89 105*  BUN 31* 32* 29* 30* 28*  CREATININE 1.86* 1.70* 1.67* 1.70* 1.72*  CALCIUM 8.7* 8.5* 8.5* 8.5* 8.9   GFR: Estimated Creatinine Clearance: 28.5 mL/min (A) (by  C-G formula based on SCr of 1.72 mg/dL (H)). Liver Function Tests: Recent Labs  Lab 09/20/18 0334 09/22/18 0527 09/23/18 0437  AST 16 12* 14*  ALT 12 11 15   ALKPHOS 69 51 63  BILITOT 0.7 0.7 0.6  PROT 6.7 5.5* 6.8  ALBUMIN 3.0* 2.5* 3.0*   No results for input(s): LIPASE, AMYLASE in the last 168 hours. No results for input(s): AMMONIA in the last 168 hours. Coagulation Profile: No results for input(s): INR, PROTIME in the last 168 hours. Cardiac Enzymes: Recent Labs  Lab 09/20/18 1559  TROPONINI 0.05*   BNP (last 3 results) No results for input(s): PROBNP in the last 8760 hours. HbA1C: No results for input(s): HGBA1C in the last 72 hours. CBG: No results for input(s): GLUCAP in the last 168 hours. Lipid Profile: Recent Labs    09/20/18 1200  CHOL 186  HDL 31*  LDLCALC 131*  TRIG 120  CHOLHDL 6.0   Thyroid Function  Tests: Recent Labs    09/20/18 1200  TSH 5.145*   Anemia Panel: Recent Labs    09/23/18 0437  RETICCTPCT 2.2   Sepsis Labs: Recent Labs  Lab 09/20/18 1010  PROCALCITON <0.10    Recent Results (from the past 240 hour(s))  SARS Coronavirus 2 (CEPHEID - Performed in [place] hospital lab), Hosp Order     Status: None   Collection Time: 09/20/18  2:55 AM  Result Value Ref Range Status   SARS Coronavirus 2 NEGATIVE NEGATIVE Final    Comment: (NOTE) If result is NEGATIVE SARS-CoV-2 target nucleic acids are NOT DETECTED. The SARS-CoV-2 RNA is generally detectable in upper and lower  respiratory specimens during the acute phase of infection. The lowest  concentration of SARS-CoV-2 viral copies this assay can detect is 250  copies / mL. A negative result does not preclude SARS-CoV-2 infection  and should not be used as the sole basis for treatment or other  patient management decisions.  A negative result may occur with  improper specimen collection / handling, submission of specimen other  than nasopharyngeal swab, presence of viral mutation(s) within the  areas targeted by this assay, and inadequate number of viral copies  (<250 copies / mL). A negative result must be combined with clinical  observations, patient history, and epidemiological information. If result is POSITIVE SARS-CoV-2 target nucleic acids are DETECTED. The SARS-CoV-2 RNA is generally detectable in upper and lower  respiratory specimens dur ing the acute phase of infection.  Positive  results are indicative of active infection with SARS-CoV-2.  Clinical  correlation with patient history and other diagnostic information is  necessary to determine patient infection status.  Positive results do  not rule out bacterial infection or co-infection with other viruses. If result is PRESUMPTIVE POSTIVE SARS-CoV-2 nucleic acids MAY BE PRESENT.   A presumptive positive result was obtained on the submitted specimen   and confirmed on repeat testing.  While 2019 novel coronavirus  (SARS-CoV-2) nucleic acids may be present in the submitted sample  additional confirmatory testing may be necessary for epidemiological  and / or clinical management purposes  to differentiate between  SARS-CoV-2 and other Sarbecovirus currently known to infect humans.  If clinically indicated additional testing with an alternate test  methodology [phone]) is advised. The SARS-CoV-2 RNA is generally  detectable in upper and lower respiratory sp ecimens during the acute  phase of infection. The expected result is Negative. Fact Sheet for Patients:  StrictlyIdeas.no Fact Sheet for Healthcare Providers: BankingDealers.co.za This test  is not yet approved or cleared by the [place] and has been authorized for detection and/or diagnosis of SARS-CoV-2 by FDA under an Emergency Use Authorization (EUA).  This EUA will remain in effect (meaning this test can be used) for the duration of the COVID-19 declaration under Section 564(b)(1) of the Act, 21 U.S.C. section 360bbb-3(b)(1), unless the authorization is terminated or revoked sooner. Performed at [facility], [place] [address], [place], [place] [address]   Culture, body fluid-bottle     Status: None (Preliminary result)   Collection Time: 09/20/18  1:23 PM  Result Value Ref Range Status   Specimen Description PLEURAL RIGHT  Final   Special Requests NONE  Final   Culture   Final    NO GROWTH 3 DAYS Performed at [facility] [address] [place], [place] [address]    Report Status PENDING  Incomplete  Gram stain     Status: None   Collection Time: 09/20/18  1:23 PM  Result Value Ref Range Status   Specimen Description PLEURAL RIGHT  Final   Special Requests NONE  Final   Gram Stain   Final    ABUNDANT WBC PRESENT, PREDOMINANTLY MONONUCLEAR NO ORGANISMS SEEN Performed at [facility], [address], [place], [place] [address]    Report Status 09/20/2018 FINAL  Final         Radiology Studies: No results found.      Scheduled Meds:  amLODipine  2.5 mg Oral Daily   aspirin  81 mg Oral Daily   atorvastatin  40 mg Oral q1800   carvedilol  6.25 mg Oral BID WC   clotrimazole   Topical BID   ferrous sulfate  325 mg Oral Q breakfast   folic acid  1 mg Oral Daily   furosemide  40 mg Intravenous BID   isosorbide-hydrALAZINE  1 tablet Oral TID   levothyroxine  75 mcg Oral Daily   nicotine  21 mg Transdermal Daily   pantoprazole  40 mg Oral BID   PARoxetine  60 mg Oral QHS   potassium chloride  20 mEq Oral Daily   sodium chloride flush  3 mL Intravenous Q12H   traZODone  50-100 mg Oral QHS   Continuous Infusions:   LOS: 2 days     Georgette Shell, MD [facility]  If 7PM-7AM, please contact night-coverage [email or web address] Password [email or web address] 09/23/2018, 11:35 AM

## 2018-09-23 NOTE — Progress Notes (Signed)
SATURATION QUALIFICATIONS:  Patient Saturations on Room Air at Rest = 100%  Patient Saturations on Room Air while Ambulating = 96%  Patient Saturations on 0 Liters of oxygen while Ambulating = 96%  Please briefly explain why patient needs home oxygen: N/A

## 2018-09-24 ENCOUNTER — Inpatient Hospital Stay (HOSPITAL_COMMUNITY): Payer: Medicare Other

## 2018-09-24 LAB — BASIC METABOLIC PANEL
Anion gap: 9 (ref 5–15)
BUN: 28 mg/dL — ABNORMAL HIGH (ref 8–23)
CO2: 22 mmol/L (ref 22–32)
Calcium: 8.6 mg/dL — ABNORMAL LOW (ref 8.9–10.3)
Chloride: 110 mmol/L (ref 98–111)
Creatinine, Ser: 1.89 mg/dL — ABNORMAL HIGH (ref 0.44–1.00)
GFR calc Af Amer: 31 mL/min — ABNORMAL LOW (ref >60)
GFR calc non Af Amer: 26 mL/min — ABNORMAL LOW (ref >60)
Glucose, Bld: 99 mg/dL (ref 70–99)
Potassium: 4 mmol/L (ref 3.5–5.1)
Sodium: 141 mmol/L (ref 135–145)

## 2018-09-24 LAB — CBC
HCT: 22.8 % — ABNORMAL LOW (ref 36.0–46.0)
Hemoglobin: 7.6 g/dL — ABNORMAL LOW (ref 12.0–15.0)
MCH: 32.5 pg (ref 26.0–34.0)
MCHC: 33.3 g/dL (ref 30.0–36.0)
MCV: 97.4 fL (ref 80.0–100.0)
Platelets: 200 10*3/uL (ref 150–400)
RBC: 2.34 MIL/uL — ABNORMAL LOW (ref 3.87–5.11)
RDW: 13.4 % (ref 11.5–15.5)
WBC: 3 10*3/uL — ABNORMAL LOW (ref 4.0–10.5)
nRBC: 0 % (ref 0.0–0.2)

## 2018-09-24 LAB — CYCLIC CITRUL PEPTIDE ANTIBODY, IGG/IGA: CCP Antibodies IgG/IgA: 5 units (ref 0–19)

## 2018-09-24 MED ORDER — FOLIC ACID 1 MG PO TABS: 1.0000 mg | ORAL_TABLET | Freq: Every day | ORAL | Status: DC

## 2018-09-24 MED ORDER — POTASSIUM CHLORIDE CRYS ER 10 MEQ PO TBCR
10.0000 meq | EXTENDED_RELEASE_TABLET | Freq: Every day | ORAL | Status: DC
  Administered 2018-09-24: 10 meq via ORAL
  Filled 2018-09-24: qty 1

## 2018-09-24 MED ORDER — NICOTINE 21 MG/24HR TD PT24: 21.0000 mg | MEDICATED_PATCH | Freq: Every day | TRANSDERMAL | 0 refills | Status: DC

## 2018-09-24 MED ORDER — FUROSEMIDE 40 MG PO TABS: 40.0000 mg | ORAL_TABLET | Freq: Every day | ORAL | 1 refills | Status: DC

## 2018-09-24 MED ORDER — FERROUS SULFATE 325 (65 FE) MG PO TABS: 325.0000 mg | ORAL_TABLET | Freq: Every day | ORAL | 3 refills | Status: DC

## 2018-09-24 MED ORDER — CLOTRIMAZOLE 1 % EX CREA: TOPICAL_CREAM | Freq: Two times a day (BID) | CUTANEOUS | 0 refills | Status: DC

## 2018-09-24 MED ORDER — ISOSORB DINITRATE-HYDRALAZINE 20-37.5 MG PO TABS: 1.0000 | ORAL_TABLET | Freq: Three times a day (TID) | ORAL | 0 refills | Status: DC

## 2018-09-24 MED ORDER — FUROSEMIDE 40 MG PO TABS
40.0000 mg | ORAL_TABLET | Freq: Every day | ORAL | Status: DC
  Administered 2018-09-24: 40 mg via ORAL
  Filled 2018-09-24: qty 1

## 2018-09-24 MED ORDER — ASPIRIN 81 MG PO CHEW: 81.0000 mg | CHEWABLE_TABLET | Freq: Every day | ORAL | Status: DC

## 2018-09-24 MED ORDER — POTASSIUM CHLORIDE CRYS ER 10 MEQ PO TBCR: 10.0000 meq | EXTENDED_RELEASE_TABLET | Freq: Every day | ORAL | 1 refills | Status: DC

## 2018-09-24 MED ORDER — ATORVASTATIN CALCIUM 40 MG PO TABS: 40.0000 mg | ORAL_TABLET | Freq: Every day | ORAL | 0 refills | Status: DC

## 2018-09-24 MED ORDER — CARVEDILOL 6.25 MG PO TABS: 6.2500 mg | ORAL_TABLET | Freq: Two times a day (BID) | ORAL | 0 refills | Status: DC

## 2018-09-24 NOTE — TOC Transition Note (Signed)
Transition of Care [facility]) - CM/SW Discharge Note   Patient Details  Name: Tiffany Leon MRN: [id number] Date of Birth: Oct 14, 1950  Transition [facility] Care [facility]) CM/SW Contact:  Carles Collet, RN Phone Number: 09/24/2018, 1:09 PM   Clinical Narrative:    Damaris Schooner w patient to discuss DC plan, Reviewed PT notes and recs. Order placed for rollator, will be delivered to room prior to DC. Patient would prefer outpatient PT, she states that her husband will be able to provide transport. Referral placed and Dr Rodena Piety updated.     Final next level of care: Home/Self Care Barriers to Discharge: No Barriers Identified   Patient Goals and CMS Choice        Discharge Placement                       Discharge Plan and Services                DME Arranged: Walker rolling with seat DME Agency: [employer]                  Social Determinants of Health (SDOH) Interventions     Readmission Risk Interventions No flowsheet data found.

## 2018-09-24 NOTE — Progress Notes (Signed)
Daily Nursing Note  Received report from, [place], Therapist, sports. Patient in no distress upon assessment. Able to get up and mobilize in the hall quite well with one rest between 250 steps. We discussed her apprehensions about discharging home as well as smoking cessation. CT head completed this morning, results with no acute intracranial findings Twenty four hour urine collection complete and sent to lab. Discharge this afternoon. AVS reviewed with patient and her husband Legrand Como who was updated by telephone.

## 2018-09-24 NOTE — Progress Notes (Signed)
Progress Note  Patient Name: Tiffany Leon Date of Encounter: 09/24/2018   Primary Cardiologist: Lyman Bishop, MD  Subjective   Intermittent headache yesterday.  No chest pain or nausea.  No palpitations.  She did ambulate some yesterday.  Inpatient Medications    Scheduled Meds:  amLODipine  2.5 mg Oral Daily   aspirin  81 mg Oral Daily   atorvastatin  40 mg Oral q1800   carvedilol  6.25 mg Oral BID WC   clotrimazole   Topical BID   ferrous sulfate  325 mg Oral Q breakfast   folic acid  1 mg Oral Daily   furosemide  40 mg Intravenous BID   isosorbide-hydrALAZINE  1 tablet Oral TID   levothyroxine  75 mcg Oral Daily   nicotine  21 mg Transdermal Daily   pantoprazole  40 mg Oral BID   PARoxetine  60 mg Oral QHS   potassium chloride  20 mEq Oral Daily   sodium chloride flush  3 mL Intravenous Q12H   traZODone  50-100 mg Oral QHS    PRN Meds: acetaminophen **OR** acetaminophen, albuterol, clonazePAM, hydrALAZINE, ondansetron **OR** ondansetron (ZOFRAN) IV   Vital Signs    Vitals:   09/23/18 1556 09/23/18 1757 09/23/18 2100 09/23/18 2304  BP: 132/73 133/81  123/67  Pulse: 77 79  77  Resp: 16     Temp: 98.3 F (36.8 C)  98.1 F (36.7 C) 98.1 F (36.7 C)  TempSrc: Oral  Oral Oral  SpO2: 96%   94%  Weight:      Height:        Intake/Output Summary (Last 24 hours) at 09/24/2018 0744 Last data filed at 09/23/2018 2200 Gross per 24 hour  Intake 480 ml  Output 800 ml  Net -320 ml   Filed Weights   09/20/18 1652 09/21/18 0600  Weight: 66.1 kg 67 kg    Telemetry    Sinus rhythm.  Personally reviewed.  ECG    Tracing from 09/19/2018 shows a sinus tachycardia with poor R wave progression, rule out anterior infarct pattern.  Personally reviewed.  Physical Exam   GEN: No acute distress.   Neck: No JVD. Cardiac: RRR, soft systolic murmur, no rub or gallop.  Respiratory: Nonlabored. Clear to auscultation bilaterally. GI: Soft, nontender, bowel  sounds present. MS:  Trace ankle edema; No deformity. Neuro:  Nonfocal. Psych: Alert and oriented x 3. Normal affect.  Labs    Chemistry Recent Labs  Lab 09/20/18 [phone]  09/21/18 2951 09/22/18 0527 09/23/18 0437  NA  --    < > 141 143 143  K  --    < > 3.7 3.7 3.5  CL  --    < > 111 113* 109  CO2  --    < > 20* 18* 23  GLUCOSE  --    < > 95 89 105*  BUN  --    < > 29* 30* 28*  CREATININE  --    < > 1.67* 1.70* 1.72*  CALCIUM  --    < > 8.5* 8.5* 8.9  PROT 6.7  --   --  5.5* 6.8  ALBUMIN 3.0*  --   --  2.5* 3.0*  AST 16  --   --  12* 14*  ALT 12  --   --  11 15  ALKPHOS 69  --   --  51 63  BILITOT 0.7  --   --  0.7 0.6  GFRNONAA  --    < >  31* 30* 30*  GFRAA  --    < > 36* 35* 34*  ANIONGAP  --    < > 10 12 11    < > = values in this interval not displayed.     Hematology Recent Labs  Lab 09/22/18 0527 09/23/18 0437 09/24/18 0703  WBC 2.9* 3.7* 3.0*  RBC 2.34* 2.72*   2.72* 2.34*  HGB 7.5* 9.0* 7.6*  HCT 22.8* 26.4* 22.8*  MCV 97.4 97.1 97.4  MCH 32.1 33.1 32.5  MCHC 32.9 34.1 33.3  RDW 13.2 13.3 13.4  PLT 170 199 200    Cardiac Enzymes Recent Labs  Lab 09/20/18 1559  TROPONINI 0.05*    Recent Labs  Lab 09/19/18 1906 09/20/18 0348  TROPIPOC 0.07 0.06     BNP Recent Labs  Lab 09/20/18 1200  BNP 608.1*     Radiology    Dg Chest 1 View  Result Date: 09/23/2018 CLINICAL DATA:  Shortness of breath.  Smoker with CHF. EXAM: CHEST  1 VIEW COMPARISON:  09/20/2018 FINDINGS: Midline trachea. Mild cardiomegaly. Atherosclerosis in the transverse aorta. Small right pleural effusion. No pneumothorax. Patchy right base airspace disease, increased. Minimal left base subsegmental atelectasis is not significantly changed. IMPRESSION: Increase in small right pleural effusion with adjacent airspace disease, favored to represent atelectasis. Aortic Atherosclerosis (ICD10-I70.0). Electronically Signed   By: Abigail Miyamoto M.D.   On: 09/23/2018 13:32    Cardiac Studies    Echocardiogram 09/20/2018: 1. 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 The cavity size was normal. Left ventricular diastolic function could not be evaluated secondary to atrial fibrillation. Elevated left ventricular  end-diastolic pressure Left ventricular diffuse hypokinesis. 2. The right ventricle has normal systolic function. The cavity was normal. There is no increase in right ventricular wall thickness. Right ventricular systolic pressure could not be assessed 3. Trivial to small circumferential pericardial effusion is present with prominent fat pad over the RV. 4. The aortic valve was not well visualized. Aortic valve regurgitation was not assessed by color flow Doppler. 5. The inferior vena cava was normal in size with <50% respiratory variability. 6. The interatrial septum appears to be lipomatous.  Patient Profile     68 y.o. female with a history of tobacco abuse and COPD, GERD, depression, hypothyroidism, and anxiety now presenting with worsening shortness of breath as well as intermittent nausea and emesis with relative anorexia, symptoms noted over the last few months.  She has been diagnosed with a cardiomyopathy, pleural effusion and pericardial effusion present, also incidentally noted coronary artery calcifications by CT and significantly elevated ESR.  Work-up ongoing.  Assessment & Plan    1.  Acute combined heart failure with LVEF approximately 45% and diffuse hypokinesis with elevated filling pressures.  Minor troponin elevation of 0.05 is not suggestive of ACS.  Medical therapy has been adjusted along with diuresis.  2.  Coronary and aortic atherosclerosis by CT imaging.  3.  Pleural effusions, right greater than left and small pericardial effusion.  Patient underwent thoracentesis revealing transudate with negative cultures and monocyte-macrophage predominant cells.  4.  Substantially elevated ESR of 117.   Patient had hematology-oncology consult and primary team plans rheumatology referral.  She has been afebrile with negative cultures so far and no obvious malignancy by chest CT.  5.  Hypertensive urgency, blood pressure trend much improved.  6.  Acute renal failure with creatinine up to 1.8 from 1.6 in the setting of diuresis.  Discussed  with primary team - contemplating discharge home for further outpatient work-up with rheumatology.  From a cardiac perspective would continue aspirin, Lipitor, Coreg, and BiDil with discontinuation of Norvasc for now anticipating further titration of her regimen as an outpatient.  Change to oral Lasix at 40 mg once daily and KCl 10 mEq once daily.  Follow-up visit with Dr. Debara Pickett or APP in the next 7 days with repeat BMET.  She can be considered for further lower extremity vascular studies and renal arterial Dopplers as an outpatient.  Signed, Rozann Lesches, MD  09/24/2018, 7:44 AM

## 2018-09-24 NOTE — Discharge Summary (Signed)
Physician Discharge Summary  Tiffany Leon KZS:[id number] DOB: Jul 18, 1950 DOA: 09/19/2018  PCP: Aretta Nip, MD  Admit date: 09/19/2018 Discharge date: 09/24/2018  Admitted From:home Disposition:home  Recommendations for Outpatient Follow-up:  1. Follow up with PCP in 1-2 weeks 2. Please obtain BMP/CBC in one week  Home Health yes Equipment/Devices:rollator  Discharge Condition stable CODE STATUS:full Diet recommendation:  cardiac  Brief/Interim Summary:68 y.o.femalewith medical history significant ofemphysema, hypothyroidism, anxiety, depression, nephrolithiasis, and GERD; who presented with complaints of nausea, vomiting, and generalized weakness over the last 2 months. She states that emesis is nonbloody and she is able to keep small amount of food and liquids down. Notes that she just has poor appetite and despite this is had increasing her weight approximately 15 pounds over the last 1 to 2 months. In the last 3weeks the diarrhea symptoms have eased off. Couple weeks ago she reported being increased on her dose of levothyroxine from 50 mcg to 75 mcg/day. Her primary physician had referred her to Dr. Michail Sermon gastroenterology who had ordered ultrasound and CAT scan of the abdomen and pelvis. Ultrasound of the abdomen on 4/29, was noted to be within normal limits. CTon 5/15 noted, bilateral nephrolithiasis with a6 mm calculus in the right renal pelvis without signs of hydronephrosis,and small pleural effusion with bilateral atelectasis. Her primary care provider had plan to refer her to [facility] for further work-up. Associated symptoms include shortness of breath, coughing, leg swelling with rash of bilateral feet that is itchy at night, wheezing at night, anxiety with panic attacks, and orthopnea. She is not on oxygen or inhalers at home. Patient admits to smoking 3/4 pack of cigarettes per day on average and has done so since the age of 1. Denies having any significant  fever, sick contacts, loss of consciousness, blood in stool, dark stools, abdominal pain, or dysuria symptoms. ED Course:Upon admission into the emergency department patient seen to be afebrile, pulse 87-104, respirations 16-34, blood pressure elevated up to 182/97, and O2 saturations maintained on room air. Labs revealed WBC 4.1, hemoglobin 9, BUN 31, creatinine 1.86, iron studies within normal limits, folate 4.3, and troponin i0.07. COVID-19 screening negative. A CT scan of the chest was obtained noting a moderate right-sided pleural effusion and smaller left-sided pleural effusion progressed from previous, bilateral nephrolithiasis with 11 m stone at right JVP junction, and atherosclerosis of the aorta. TRH called to admit   Discharge Diagnoses:  Principal Problem:   Pleural effusion Active Problems:   SOB (shortness of breath)   Hypertensive urgency   AKI (acute kidney injury) (HCC)   Nausea and vomiting   Hypothyroidism   Tobacco abuse   Tinea pedis   COPD (chronic obstructive pulmonary disease) (HCC)   Nephrolithiasis  #1 bilateral pleural effusion right more than left/diastolic CHF status post thoracentesis on the right removed 900 cc of fluid. Mildly elevated LDH in the fluid with mesothelial cells present. Patient presented with increased weight gain lower extremity edema and dyspnea on exertion with shortness of breath. BNP on admission was 680 she is being continued on IV diuretics. She still continues to feel short of breath dizziness improved short of breath with any movement or activity. I do not see any evidence of infection or need for antibiotics at this time. A CT scan of the chest abdomen and pelvis without contrast showed right pleural effusion with small pericardial effusion. There was no other acute changes noted in the CT scan. No evidence of malignancy reported by CT scan she  is in 99% on room air. Cardiology would like to monitor her in hospital for continued  IV diuresis.. Pleural fluid culture negative Gram stain shows abundant WBC predominantly mononuclear no organisms.  Pleural pleural fluid culture no growth.  However rheumatoid factor is very high about 600.  I will order citrulline to confirm.  She needs outpatient work-up by a rheumatologist.  Follow-up chest x-ray that was done today.  #2 anemia with no evidence of active bleeding and negative FOBT with low iron and low folic acid.  Her hemoglobin is actually better without transfusion which makes me think yesterday's hemoglobin was spuriously.  #3 elevated inflammatory markers with elevated CRP d-dimer sed rate LDH COVID negative. She has no cough or fever.  Rheumatoid factor very high above 600.  SPEP and UPEP ANCA pending.?  Underlying connective tissue disorder.  #4 hypertensive urgency improved with Coreg and Norvasc  #5 AKI monitor on IV diuresis creatinine trending up.  #6 hypothyroidism continue current Synthroid dose.  #7 COPD stable  #8Hypoalbumenemia-?nutritional  Estimated body mass index is 23.84 kg/m as calculated from the following:   Height as of this encounter: 5\' 6"  (1.676 m).   Weight as of this encounter: 67 kg.  Discharge Instructions  Discharge Instructions    Diet - low sodium heart healthy   Complete by:  As directed    Increase activity slowly   Complete by:  As directed      Allergies as of 09/24/2018      Reactions   Codeine Nausea And Vomiting      Medication List    STOP taking these medications   buPROPion 150 MG 24 hr tablet Commonly known as:  WELLBUTRIN XL     TAKE these medications   aspirin 81 MG chewable tablet Chew 1 tablet (81 mg total) by mouth daily. Start taking on:  September 25, 2018   atorvastatin 40 MG tablet Commonly known as:  LIPITOR Take 1 tablet (40 mg total) by mouth daily at 6 PM.   carvedilol 6.25 MG tablet Commonly known as:  COREG Take 1 tablet (6.25 mg total) by mouth 2 (two) times daily with a meal.    clonazePAM 0.5 MG tablet Commonly known as:  KLONOPIN Take 0.5 mg by mouth 2 (two) times daily as needed for anxiety.   clotrimazole 1 % cream Commonly known as:  LOTRIMIN Apply topically 2 (two) times daily.   ferrous sulfate 325 (65 FE) MG tablet Take 1 tablet (325 mg total) by mouth daily with breakfast.   folic acid 1 MG tablet Commonly known as:  FOLVITE Take 1 tablet (1 mg total) by mouth daily.   furosemide 40 MG tablet Commonly known as:  LASIX Take 1 tablet (40 mg total) by mouth daily.   isosorbide-hydrALAZINE 20-37.5 MG tablet Commonly known as:  BIDIL Take 1 tablet by mouth 3 (three) times daily.   levothyroxine 75 MCG tablet Commonly known as:  SYNTHROID Take 75 mcg by mouth daily.   nicotine 21 mg/24hr patch Commonly known as:  NICODERM CQ - dosed in mg/24 hours Place 1 patch (21 mg total) onto the skin daily.   PARoxetine 40 MG tablet Commonly known as:  PAXIL TAKE 1.5 TABLETS BY MOUTH DAILY AT BEDTIME What changed:  See the new instructions.   potassium chloride 10 MEQ tablet Commonly known as:  K-DUR Take 1 tablet (10 mEq total) by mouth daily.   traZODone 50 MG tablet Commonly known as:  DESYREL Take 50-100 mg  by mouth at bedtime.      Follow-up Information    Rankins, Bill Salinas, MD Follow up.   Specialty:  Family Medicine Contact information: [place] [address] [phone]        Pixie Casino, MD .   Specialty:  Cardiology Contact information: [place] [address] [phone]        Bo Merino, MD Follow up.   Specialty:  Rheumatology Why:  This patient had multiple symptoms with elevated rheumatoid factor over 600 with anemia renal insufficiency hypoalbuminemia peripheral edema pleural effusion she needs outpatient complete rheumatologic work-up.  Please call and make appointment. Contact information: [place]  [address] [phone]        [name], MD Follow up.   Specialty:  Oncology Contact information: [address] [place] [address] [phone]          Allergies  Allergen Reactions  . Codeine Nausea And Vomiting    Consultations:  Dr Marin Olp   Dr hilty    Procedures/Studies: Ct Abdomen Pelvis Wo Contrast  Result Date: 09/01/2018 CLINICAL DATA:  Nausea and vomiting. Weight loss. Intermittent diarrhea for 8 weeks. Personal history of breast carcinoma. EXAM: CT ABDOMEN AND PELVIS WITHOUT CONTRAST TECHNIQUE: Multidetector CT imaging of the abdomen and pelvis was performed following the standard protocol without IV contrast. COMPARISON:  None. FINDINGS: Lower chest: Small right pleural effusion and tiny left pleural few are seen. Mild bibasilar atelectasis or scarring, right side greater than left. Hepatobiliary: No mass visualized on this unenhanced exam. Gallbladder is unremarkable. Pancreas: No mass or inflammatory process visualized on this unenhanced exam. Spleen:  Within normal limits in size. Adrenals/Urinary tract: Small renal calculi are seen bilaterally. A 6 mm calculus is seen in the right renal pelvis, however there is no evidence of hydronephrosis. No evidence of ureteral calculi or dilatation. Stomach/Bowel: No evidence of obstruction, inflammatory process, or abnormal fluid collections. Normal appendix visualized. Vascular/Lymphatic: No pathologically enlarged lymph nodes identified. No evidence of abdominal aortic aneurysm. Aortic atherosclerosis. Reproductive:  No mass or other significant abnormality. Other:  None. Musculoskeletal:  No suspicious bone lesions identified. IMPRESSION: 1. No acute findings within the abdomen or pelvis. 2. Bilateral nephrolithiasis. 6 mm calculus in right renal pelvis, without evidence of hydronephrosis. 3. Small pleural effusions and bibasilar atelectasis versus scarring, right side greater than left. Aortic  Atherosclerosis (ICD10-I70.0). Electronically Signed   By: Earle Gell M.D.   On: 09/01/2018 12:40   Dg Chest 1 View  Result Date: 09/23/2018 CLINICAL DATA:  Shortness of breath.  Smoker with CHF. EXAM: CHEST  1 VIEW COMPARISON:  09/20/2018 FINDINGS: Midline trachea. Mild cardiomegaly. Atherosclerosis in the transverse aorta. Small right pleural effusion. No pneumothorax. Patchy right base airspace disease, increased. Minimal left base subsegmental atelectasis is not significantly changed. IMPRESSION: Increase in small right pleural effusion with adjacent airspace disease, favored to represent atelectasis. Aortic Atherosclerosis (ICD10-I70.0). Electronically Signed   By: Abigail Miyamoto M.D.   On: 09/23/2018 13:32   Dg Chest 1 View  Result Date: 09/20/2018 CLINICAL DATA:  Initial evaluation for pleural effusion. Status post right thoracentesis. EXAM: CHEST  1 VIEW COMPARISON:  Prior CT from earlier the same day. FINDINGS: Cardiac and mediastinal silhouettes are stable in size and contour, and remain within normal limits. Aortic atherosclerosis. Lungs well inflated. No pneumothorax seen status post thoracentesis. Small residual right-sided pleural effusion, decreased from previous. Small amount of trapped fluid noted  along the right minor fissure. Improved aeration at the right lung base. No new focal airspace disease. No pulmonary edema. No acute osseous finding. IMPRESSION: 1. Small residual right-sided pleural effusion status post thoracentesis, decreased from previous. No pneumothorax. 2. Improved aeration at the right lung base. 3. Aortic atherosclerosis. Electronically Signed   By: Jeannine Boga M.D.   On: 09/20/2018 13:45   Dg Chest 2 View  Result Date: 09/19/2018 CLINICAL DATA:  Shortness of breath, productive cough. EXAM: CHEST - 2 VIEW COMPARISON:  Radiographs of August 08, 2014. FINDINGS: The heart size and mediastinal contours are within normal limits. Left lung is clear. Right lower lobe  atelectasis or infiltrate is noted with mild right pleural effusion. The visualized skeletal structures are unremarkable. IMPRESSION: Right lower lobe opacity is noted concerning for atelectasis or pneumonia with mild right pleural effusion. Electronically Signed   By: Marijo Conception M.D.   On: 09/19/2018 19:39   Ct Chest Wo Contrast  Result Date: 09/20/2018 CLINICAL DATA:  Shortness of breath and cough for 2 weeks EXAM: CT CHEST WITHOUT CONTRAST TECHNIQUE: Multidetector CT imaging of the chest was performed following the standard protocol without IV contrast. COMPARISON:  Chest x-ray from 2 days ago.  Abdominal CT 09/01/2018 FINDINGS: Cardiovascular: Normal heart size. Small pericardial effusion. Aortic and coronary atherosclerosis. Mediastinum/Nodes: Negative for adenopathy or mass. Lungs/Pleura: Hyperinflation with mild emphysema. There is a moderate right and small left layering pleural effusion with atelectasis or scarring. Upper Abdomen: Bilateral nephrolithiasis with a 11 mm stone at the right UPJ. No urinary obstruction where visualized. Musculoskeletal: No acute or aggressive finding IMPRESSION: 1. Pleural effusion that is moderate on the right and small on the left, increased from 09/01/2018 abdominal CT. 2. Small pericardial effusion which is new from prior. 3. Atherosclerosis and bilateral nephrolithiasis. 4. Mild emphysema. Electronically Signed   By: Monte Fantasia M.D.   On: 09/20/2018 04:19   Ir Thoracentesis Asp Pleural Space W/img Guide  Result Date: 09/20/2018 INDICATION: Patient with history of dyspnea, pericardial effusion, and right pleural effusion. Request is made for diagnostic and therapeutic right thoracentesis. EXAM: ULTRASOUND GUIDED DIAGNOSTIC AND THERAPEUTIC RIGHT THORACENTESIS MEDICATIONS: 10 mL 1% lidocaine COMPLICATIONS: None immediate. PROCEDURE: An ultrasound guided thoracentesis was thoroughly discussed with the patient and questions answered. The benefits, risks,  alternatives and complications were also discussed. The patient understands and wishes to proceed with the procedure. Written consent was obtained. Ultrasound was performed to localize and mark an adequate pocket of fluid in the right chest. The area was then prepped and draped in the normal sterile fashion. 1% Lidocaine was used for local anesthesia. Under ultrasound guidance a 6 Fr Safe-T-Centesis catheter was introduced. Thoracentesis was performed. The catheter was removed and a dressing applied. FINDINGS: A total of approximately 900 mL of clear yellow fluid was removed. Samples were sent to the laboratory as requested by the clinical team. IMPRESSION: Successful ultrasound guided right thoracentesis yielding 900 mL of pleural fluid. Read by: Earley Abide, PA-C Electronically Signed   By: Jerilynn Mages.  Shick M.D.   On: 09/20/2018 13:50    (Echo, Carotid, EGD, Colonoscopy, ERCP)    Subjective:   Discharge Exam: Vitals:   09/23/18 2304 09/24/18 0818  BP: 123/67 (!) 149/85  Pulse: 77 83  Resp:  16  Temp: 98.1 F (36.7 C) 97.9 F (36.6 C)  SpO2: 94% 96%   Vitals:   09/23/18 1757 09/23/18 2100 09/23/18 2304 09/24/18 0818  BP: 133/81  123/67 (!) 149/85  Pulse:  79  77 83  Resp:    16  Temp:  98.1 F (36.7 C) 98.1 F (36.7 C) 97.9 F (36.6 C)  TempSrc:  Oral Oral Oral  SpO2:   94% 96%  Weight:      Height:        General: Pt is alert, awake, not in acute distress Cardiovascular: RRR, S1/S2 +, no rubs, no gallops Respiratory: CTA bilaterally, no wheezing, no rhonchi Abdominal: Soft, NT, ND, bowel sounds + Extremities: no edema, no cyanosis    The results of significant diagnostics from this hospitalization (including imaging, microbiology, ancillary and laboratory) are listed below for reference.     Microbiology: Recent Results (from the past 240 hour(s))  SARS Coronavirus 2 (CEPHEID - Performed in [facility] hospital lab), Hosp Order     Status: None   Collection Time:  09/20/18  2:55 AM  Result Value Ref Range Status   SARS Coronavirus 2 NEGATIVE NEGATIVE Final    Comment: (NOTE) If result is NEGATIVE SARS-CoV-2 target nucleic acids are NOT DETECTED. The SARS-CoV-2 RNA is generally detectable in upper and lower  respiratory specimens during the acute phase of infection. The lowest  concentration of SARS-CoV-2 viral copies this assay can detect is 250  copies / mL. A negative result does not preclude SARS-CoV-2 infection  and should not be used as the sole basis for treatment or other  patient management decisions.  A negative result may occur with  improper specimen collection / handling, submission of specimen other  than nasopharyngeal swab, presence of viral mutation(s) within the  areas targeted by this assay, and inadequate number of viral copies  (<250 copies / mL). A negative result must be combined with clinical  observations, patient history, and epidemiological information. If result is POSITIVE SARS-CoV-2 target nucleic acids are DETECTED. The SARS-CoV-2 RNA is generally detectable in upper and lower  respiratory specimens dur ing the acute phase of infection.  Positive  results are indicative of active infection with SARS-CoV-2.  Clinical  correlation with patient history and other diagnostic information is  necessary to determine patient infection status.  Positive results do  not rule out bacterial infection or co-infection with other viruses. If result is PRESUMPTIVE POSTIVE SARS-CoV-2 nucleic acids MAY BE PRESENT.   A presumptive positive result was obtained on the submitted specimen  and confirmed on repeat testing.  While 2019 novel coronavirus  (SARS-CoV-2) nucleic acids may be present in the submitted sample  additional confirmatory testing may be necessary for epidemiological  and / or clinical management purposes  to differentiate between  SARS-CoV-2 and other Sarbecovirus currently known to infect humans.  If clinically  indicated additional testing with an alternate test  methodology [phone]) is advised. The SARS-CoV-2 RNA is generally  detectable in upper and lower respiratory sp ecimens during the acute  phase of infection. The expected result is Negative. Fact Sheet for Patients:  StrictlyIdeas.no Fact Sheet for Healthcare Providers: BankingDealers.co.za This test is not yet approved or cleared by the [place] FDA and has been authorized for detection and/or diagnosis of SARS-CoV-2 by FDA under an Emergency Use Authorization (EUA).  This EUA will remain in effect (meaning this test can be used) for the duration of the COVID-19 declaration under Section 564(b)(1) of the Act, 21 U.S.C. section 360bbb-3(b)(1), unless the authorization is terminated or revoked sooner. Performed at [facility], [place] [address], [place], [place] [address]   Culture, body fluid-bottle     Status: None (  Preliminary result)   Collection Time: 09/20/18  1:23 PM  Result Value Ref Range Status   Specimen Description PLEURAL RIGHT  Final   Special Requests NONE  Final   Culture   Final    NO GROWTH 4 DAYS Performed at [facility], [address], [place], [place] [address]    Report Status PENDING  Incomplete  Gram stain     Status: None   Collection Time: 09/20/18  1:23 PM  Result Value Ref Range Status   Specimen Description PLEURAL RIGHT  Final   Special Requests NONE  Final   Gram Stain   Final    ABUNDANT WBC PRESENT, PREDOMINANTLY MONONUCLEAR NO ORGANISMS SEEN Performed at [facility], [address] [place], [place] [address]    Report Status 09/20/2018 FINAL  Final     Labs: BNP (last 3 results) Recent Labs    09/20/18 1200  BNP 175.1*   Basic Metabolic Panel: Recent Labs  Lab 09/20/18 1200 09/21/18 0651 09/22/18 0527 09/23/18 0437 09/24/18 0703  NA 141 141 143 143 141  K 3.7 3.7 3.7 3.5 4.0  CL 112* 111 113* 109 110   CO2 21* 20* 18* 23 22  GLUCOSE 91 95 89 105* 99  BUN 32* 29* 30* 28* 28*  CREATININE 1.70* 1.67* 1.70* 1.72* 1.89*  CALCIUM 8.5* 8.5* 8.5* 8.9 8.6*   Liver Function Tests: Recent Labs  Lab 09/20/18 0334 09/22/18 0527 09/23/18 0437  AST 16 12* 14*  ALT 12 11 15   ALKPHOS 69 51 63  BILITOT 0.7 0.7 0.6  PROT 6.7 5.5* 6.8  ALBUMIN 3.0* 2.5* 3.0*   No results for input(s): LIPASE, AMYLASE in the last 168 hours. No results for input(s): AMMONIA in the last 168 hours. CBC: Recent Labs  Lab 09/19/18 1858 09/20/18 1010 09/21/18 0651 09/22/18 0527 09/23/18 0437 09/24/18 0703  WBC 4.1  --  3.0* 2.9* 3.7* 3.0*  NEUTROABS  --   --   --  1.3*  --   --   HGB 9.0* 8.3* 8.0* 7.5* 9.0* 7.6*  HCT 26.3* 25.2* 24.1* 22.8* 26.4* 22.8*  MCV 96.0  --  96.0 97.4 97.1 97.4  PLT 199  --  167 170 199 200   Cardiac Enzymes: Recent Labs  Lab 09/20/18 1559  TROPONINI 0.05*   BNP: Invalid input(s): POCBNP CBG: No results for input(s): GLUCAP in the last 168 hours. D-Dimer No results for input(s): DDIMER in the last 72 hours. Hgb A1c No results for input(s): HGBA1C in the last 72 hours. Lipid Profile No results for input(s): CHOL, HDL, LDLCALC, TRIG, CHOLHDL, LDLDIRECT in the last 72 hours. Thyroid function studies No results for input(s): TSH, T4TOTAL, T3FREE, THYROIDAB in the last 72 hours.  Invalid input(s): FREET3 Anemia work up Recent Labs    09/23/18 0437  RETICCTPCT 2.2   Urinalysis    Component Value Date/Time   COLORURINE YELLOW 09/20/2018 2100   APPEARANCEUR HAZY (A) 09/20/2018 2100   LABSPEC 1.018 09/20/2018 2100   PHURINE 5.0 09/20/2018 2100   GLUCOSEU NEGATIVE 09/20/2018 2100   HGBUR LARGE (A) 09/20/2018 2100   BILIRUBINUR NEGATIVE 09/20/2018 2100   KETONESUR 5 (A) 09/20/2018 2100   PROTEINUR >=300 (A) 09/20/2018 2100   NITRITE NEGATIVE 09/20/2018 2100   LEUKOCYTESUR NEGATIVE 09/20/2018 2100   Sepsis Labs Invalid input(s): PROCALCITONIN,  WBC,   LACTICIDVEN Microbiology Recent Results (from the past 240 hour(s))  SARS Coronavirus 2 (CEPHEID - Performed in [facility] hospital  lab), Hosp Order     Status: None   Collection Time: 09/20/18  2:55 AM  Result Value Ref Range Status   SARS Coronavirus 2 NEGATIVE NEGATIVE Final    Comment: (NOTE) If result is NEGATIVE SARS-CoV-2 target nucleic acids are NOT DETECTED. The SARS-CoV-2 RNA is generally detectable in upper and lower  respiratory specimens during the acute phase of infection. The lowest  concentration of SARS-CoV-2 viral copies this assay can detect is 250  copies / mL. A negative result does not preclude SARS-CoV-2 infection  and should not be used as the sole basis for treatment or other  patient management decisions.  A negative result may occur with  improper specimen collection / handling, submission of specimen other  than nasopharyngeal swab, presence of viral mutation(s) within the  areas targeted by this assay, and inadequate number of viral copies  (<250 copies / mL). A negative result must be combined with clinical  observations, patient history, and epidemiological information. If result is POSITIVE SARS-CoV-2 target nucleic acids are DETECTED. The SARS-CoV-2 RNA is generally detectable in upper and lower  respiratory specimens dur ing the acute phase of infection.  Positive  results are indicative of active infection with SARS-CoV-2.  Clinical  correlation with patient history and other diagnostic information is  necessary to determine patient infection status.  Positive results do  not rule out bacterial infection or co-infection with other viruses. If result is PRESUMPTIVE POSTIVE SARS-CoV-2 nucleic acids MAY BE PRESENT.   A presumptive positive result was obtained on the submitted specimen  and confirmed on repeat testing.  While 2019 novel coronavirus  (SARS-CoV-2) nucleic acids may be present in the submitted sample  additional confirmatory testing may  be necessary for epidemiological  and / or clinical management purposes  to differentiate between  SARS-CoV-2 and other Sarbecovirus currently known to infect humans.  If clinically indicated additional testing with an alternate test  methodology [phone]) is advised. The SARS-CoV-2 RNA is generally  detectable in upper and lower respiratory sp ecimens during the acute  phase of infection. The expected result is Negative. Fact Sheet for Patients:  StrictlyIdeas.no Fact Sheet for Healthcare Providers: BankingDealers.co.za This test is not yet approved or cleared by the [place] FDA and has been authorized for detection and/or diagnosis of SARS-CoV-2 by FDA under an Emergency Use Authorization (EUA).  This EUA will remain in effect (meaning this test can be used) for the duration of the COVID-19 declaration under Section 564(b)(1) of the Act, 21 U.S.C. section 360bbb-3(b)(1), unless the authorization is terminated or revoked sooner. Performed at [facility], [place] [address] [place], [place] [address]   Culture, body fluid-bottle     Status: None (Preliminary result)   Collection Time: 09/20/18  1:23 PM  Result Value Ref Range Status   Specimen Description PLEURAL RIGHT  Final   Special Requests NONE  Final   Culture   Final    NO GROWTH 4 DAYS Performed at [facility] [address], [place], [place] [address]    Report Status PENDING  Incomplete  Gram stain     Status: None   Collection Time: 09/20/18  1:23 PM  Result Value Ref Range Status   Specimen Description PLEURAL RIGHT  Final   Special Requests NONE  Final   Gram Stain   Final    ABUNDANT WBC PRESENT, PREDOMINANTLY MONONUCLEAR NO ORGANISMS SEEN Performed at [facility], [address] [place], [place] [address]  Report Status 09/20/2018 FINAL  Final     Time coordinating discharge: 33  minutes  SIGNED:   Georgette Shell,  MD  [facility] 09/24/2018, 9:39 AM Pager   If 7PM-7AM, please contact night-coverage [email or web address] Password [email or web address]

## 2018-09-25 LAB — PROTEIN ELECTROPHORESIS, SERUM
A/G Ratio: 0.9 (ref 0.7–1.7)
A/G Ratio: 0.9 (ref 0.7–1.7)
Albumin ELP: 2.9 g/dL (ref 2.9–4.4)
Albumin ELP: 2.8 g/dL — ABNORMAL LOW (ref 2.9–4.4)
Alpha-1-Globulin: 0.4 g/dL (ref 0.0–0.4)
Alpha-1-Globulin: 0.3 g/dL (ref 0.0–0.4)
Alpha-2-Globulin: 0.7 g/dL (ref 0.4–1.0)
Alpha-2-Globulin: 0.6 g/dL (ref 0.4–1.0)
Beta Globulin: 1.1 g/dL (ref 0.7–1.3)
Beta Globulin: 0.9 g/dL (ref 0.7–1.3)
Gamma Globulin: 1.2 g/dL (ref 0.4–1.8)
Gamma Globulin: 1.3 g/dL (ref 0.4–1.8)
Globulin, Total: 3.3 g/dL (ref 2.2–3.9)
Globulin, Total: 3.1 g/dL (ref 2.2–3.9)
M-Spike, %: 1.1 g/dL — ABNORMAL HIGH
M-Spike, %: 1.1 g/dL — ABNORMAL HIGH
Total Protein ELP: 6.2 g/dL (ref 6.0–8.5)
Total Protein ELP: 5.9 g/dL — ABNORMAL LOW (ref 6.0–8.5)

## 2018-09-25 LAB — ERYTHROPOIETIN: Erythropoietin: 24.1 m[IU]/mL — ABNORMAL HIGH (ref 2.6–18.5)

## 2018-09-25 LAB — MPO/PR-3 (ANCA) ANTIBODIES
ANCA Proteinase 3: <3.5 U/mL (ref 0.0–3.5)
Myeloperoxidase Abs: <9 U/mL (ref 0.0–9.0)

## 2018-09-25 LAB — CULTURE, BODY FLUID W GRAM STAIN -BOTTLE: Culture: NO GROWTH

## 2018-09-25 LAB — ANCA TITERS
Atypical P-ANCA titer: <1:20 {titer}
C-ANCA: <1:20 {titer}
P-ANCA: <1:20 {titer}

## 2018-09-26 LAB — UPEP/UIFE/LIGHT CHAINS/TP, 24-HR UR
% BETA, Urine: 10.3 %
ALPHA 1 URINE: 1.3 %
Albumin, U: 73.2 %
Alpha 2, Urine: 6.1 %
Free Kappa Lt Chains,Ur: 348.38 mg/L — ABNORMAL HIGH (ref 0.63–113.79)
Free Kappa/Lambda Ratio: 44.27 — ABNORMAL HIGH (ref 1.03–31.76)
Free Lambda Lt Chains,Ur: 7.87 mg/L (ref 0.47–11.77)
GAMMA GLOBULIN URINE: 9.1 %
M-SPIKE %, Urine: 4.7 % — ABNORMAL HIGH
M-Spike, Mg/24 Hr: 22 mg/24 hr — ABNORMAL HIGH
Total Protein, Urine-Ur/day: 459 mg/24 hr — ABNORMAL HIGH (ref 30–150)
Total Protein, Urine: 91.8 mg/dL
Total Volume: 500

## 2018-09-27 ENCOUNTER — Emergency Department (HOSPITAL_COMMUNITY): Payer: Medicare Other

## 2018-09-27 ENCOUNTER — Encounter (HOSPITAL_COMMUNITY): Payer: Self-pay

## 2018-09-27 ENCOUNTER — Other Ambulatory Visit: Payer: Self-pay

## 2018-09-27 ENCOUNTER — Inpatient Hospital Stay (HOSPITAL_COMMUNITY)
Admission: EM | Admit: 2018-09-27 | Discharge: 2018-10-04 | DRG: 292 | Disposition: A | Discharge status: Home Health | Payer: Medicare Other | Attending: Internal Medicine | Admitting: Internal Medicine

## 2018-09-27 DIAGNOSIS — F419 Anxiety disorder, unspecified: Secondary | ICD-10-CM | POA: Diagnosis present

## 2018-09-27 DIAGNOSIS — Z720 Tobacco use: Secondary | ICD-10-CM | POA: Diagnosis present

## 2018-09-27 DIAGNOSIS — N183 Chronic kidney disease, stage 3 unspecified: Secondary | ICD-10-CM | POA: Diagnosis present

## 2018-09-27 DIAGNOSIS — K219 Gastro-esophageal reflux disease without esophagitis: Secondary | ICD-10-CM | POA: Diagnosis present

## 2018-09-27 DIAGNOSIS — F32A Depression, unspecified: Secondary | ICD-10-CM | POA: Diagnosis present

## 2018-09-27 DIAGNOSIS — Z8249 Family history of ischemic heart disease and other diseases of the circulatory system: Secondary | ICD-10-CM

## 2018-09-27 DIAGNOSIS — R0902 Hypoxemia: Secondary | ICD-10-CM | POA: Diagnosis present

## 2018-09-27 DIAGNOSIS — Z1159 Encounter for screening for other viral diseases: Secondary | ICD-10-CM

## 2018-09-27 DIAGNOSIS — I5022 Chronic systolic (congestive) heart failure: Secondary | ICD-10-CM | POA: Diagnosis present

## 2018-09-27 DIAGNOSIS — E785 Hyperlipidemia, unspecified: Secondary | ICD-10-CM | POA: Diagnosis present

## 2018-09-27 DIAGNOSIS — I5023 Acute on chronic systolic (congestive) heart failure: Principal | ICD-10-CM | POA: Diagnosis present

## 2018-09-27 DIAGNOSIS — J90 Pleural effusion, not elsewhere classified: Secondary | ICD-10-CM | POA: Diagnosis present

## 2018-09-27 DIAGNOSIS — N179 Acute kidney failure, unspecified: Secondary | ICD-10-CM | POA: Diagnosis present

## 2018-09-27 DIAGNOSIS — Z9111 Patient's noncompliance with dietary regimen: Secondary | ICD-10-CM

## 2018-09-27 DIAGNOSIS — E039 Hypothyroidism, unspecified: Secondary | ICD-10-CM | POA: Diagnosis present

## 2018-09-27 DIAGNOSIS — F17210 Nicotine dependence, cigarettes, uncomplicated: Secondary | ICD-10-CM | POA: Diagnosis present

## 2018-09-27 DIAGNOSIS — R1011 Right upper quadrant pain: Secondary | ICD-10-CM

## 2018-09-27 DIAGNOSIS — J449 Chronic obstructive pulmonary disease, unspecified: Secondary | ICD-10-CM | POA: Diagnosis present

## 2018-09-27 DIAGNOSIS — Z79899 Other long term (current) drug therapy: Secondary | ICD-10-CM

## 2018-09-27 DIAGNOSIS — Z79890 Hormone replacement therapy (postmenopausal): Secondary | ICD-10-CM

## 2018-09-27 DIAGNOSIS — D509 Iron deficiency anemia, unspecified: Secondary | ICD-10-CM | POA: Diagnosis present

## 2018-09-27 DIAGNOSIS — R109 Unspecified abdominal pain: Secondary | ICD-10-CM | POA: Diagnosis present

## 2018-09-27 DIAGNOSIS — R112 Nausea with vomiting, unspecified: Secondary | ICD-10-CM | POA: Diagnosis present

## 2018-09-27 DIAGNOSIS — F329 Major depressive disorder, single episode, unspecified: Secondary | ICD-10-CM | POA: Diagnosis present

## 2018-09-27 DIAGNOSIS — R079 Chest pain, unspecified: Secondary | ICD-10-CM | POA: Diagnosis present

## 2018-09-27 DIAGNOSIS — E611 Iron deficiency: Secondary | ICD-10-CM | POA: Diagnosis present

## 2018-09-27 HISTORY — DX: Dyspnea, unspecified: R06.00

## 2018-09-27 LAB — BASIC METABOLIC PANEL
Anion gap: 10 (ref 5–15)
BUN: 32 mg/dL — ABNORMAL HIGH (ref 8–23)
CO2: 20 mmol/L — ABNORMAL LOW (ref 22–32)
Calcium: 8.8 mg/dL — ABNORMAL LOW (ref 8.9–10.3)
Chloride: 111 mmol/L (ref 98–111)
Creatinine, Ser: 1.49 mg/dL — ABNORMAL HIGH (ref 0.44–1.00)
GFR calc Af Amer: 41 mL/min — ABNORMAL LOW (ref >60)
GFR calc non Af Amer: 35 mL/min — ABNORMAL LOW (ref >60)
Glucose, Bld: 116 mg/dL — ABNORMAL HIGH (ref 70–99)
Potassium: 3.9 mmol/L (ref 3.5–5.1)
Sodium: 141 mmol/L (ref 135–145)

## 2018-09-27 LAB — CBC
HCT: 25.8 % — ABNORMAL LOW (ref 36.0–46.0)
Hemoglobin: 8.3 g/dL — ABNORMAL LOW (ref 12.0–15.0)
MCH: 32 pg (ref 26.0–34.0)
MCHC: 32.2 g/dL (ref 30.0–36.0)
MCV: 99.6 fL (ref 80.0–100.0)
Platelets: 252 10*3/uL (ref 150–400)
RBC: 2.59 MIL/uL — ABNORMAL LOW (ref 3.87–5.11)
RDW: 13.5 % (ref 11.5–15.5)
WBC: 3.8 10*3/uL — ABNORMAL LOW (ref 4.0–10.5)
nRBC: 0 % (ref 0.0–0.2)

## 2018-09-27 LAB — TROPONIN I: Troponin I: <0.03 ng/mL (ref <0.03)

## 2018-09-27 MED ORDER — ONDANSETRON HCL 4 MG/2ML IJ SOLN
4.0000 mg | Freq: Once | INTRAMUSCULAR | Status: AC
  Administered 2018-09-28: 01:00:00 4 mg via INTRAVENOUS
  Filled 2018-09-27: qty 2

## 2018-09-27 MED ORDER — NITROGLYCERIN 0.4 MG SL SUBL
0.4000 mg | SUBLINGUAL_TABLET | SUBLINGUAL | Status: DC | PRN
  Administered 2018-09-28: 0.4 mg via SUBLINGUAL
  Filled 2018-09-27: qty 1

## 2018-09-27 MED ORDER — SODIUM CHLORIDE 0.9% FLUSH
3.0000 mL | Freq: Once | INTRAVENOUS | Status: AC
  Administered 2018-09-28: 3 mL via INTRAVENOUS

## 2018-09-27 NOTE — ED Provider Notes (Signed)
TIME SEEN: 11:27 PM  CHIEF COMPLAINT: Chest pain, shortness of breath, nausea and vomiting  HPI: Patient is a 68 year old female with history of CHF, emphysema, hypothyroidism, tobacco use who presents to the emergency department with complaints of diffuse anterior chest pain without radiation that she describes as a pressure and tightness, shortness of breath, nausea and vomiting that started tonight.  States this started while at rest.  States "I thought I was going to die".  She denies abdominal pain currently.  No diarrhea.  No fevers or chills.  Recently admitted to the hospital for shortness of breath and had a thoracentesis for right-sided pleural effusion.  Also was found to have a trace pericardial effusion on echocardiogram.  ROS: See HPI Constitutional: no fever  Eyes: no drainage  ENT: no runny nose   Cardiovascular:   chest pain  Resp:  SOB  GI: no vomiting GU: no dysuria Integumentary: no rash  Allergy: no hives  Musculoskeletal: no leg swelling  Neurological: no slurred speech ROS otherwise negative  PAST MEDICAL HISTORY/PAST SURGICAL HISTORY:  Past Medical History:  Diagnosis Date  . Acute kidney injury ([facility])    a. 09/2018  . Anxiety   . Aortic atherosclerosis ([facility])    a. 09/2018 noted on Chest CT.  Marland Kitchen Chronic combined systolic (congestive) and diastolic (congestive) heart failure ([facility])    a. 09/2018 Echo: EF 45-50%, diff HK. Triv to small circumfirential pericardial eff.  . Chronic DOE (dyspnea on exertion)   . Claudication [facility])    a. Bilat hip claudication since ~ 2019.  Marland Kitchen Depression   . Emphysema lung ([facility])   . Exertional angina ([facility])    a. Ex angina since ~ 2019.  Marland Kitchen GERD (gastroesophageal reflux disease)   . Hiatal hernia   . History of bronchitis   . History of kidney stones   . Hypothyroidism   . Pleural effusion    a. 09/2018 s/p thoracentesis-->938ml.  Marland Kitchen Resting tremor   . Tobacco abuse     MEDICATIONS:  Prior to Admission medications   Medication  Sig Start Date End Date Taking? Authorizing Provider  aspirin 81 MG chewable tablet Chew 1 tablet (81 mg total) by mouth daily. 09/25/18   Georgette Shell, MD  atorvastatin (LIPITOR) 40 MG tablet Take 1 tablet (40 mg total) by mouth daily at 6 PM. 09/24/18   Georgette Shell, MD  carvedilol (COREG) 6.25 MG tablet Take 1 tablet (6.25 mg total) by mouth 2 (two) times daily with a meal. 09/24/18   Georgette Shell, MD  clonazePAM (KLONOPIN) 0.5 MG tablet Take 0.5 mg by mouth 2 (two) times daily as needed for anxiety.    [provider]  clotrimazole (LOTRIMIN) 1 % cream Apply topically 2 (two) times daily. 09/24/18   Georgette Shell, MD  ferrous sulfate 325 (65 FE) MG tablet Take 1 tablet (325 mg total) by mouth daily with breakfast. 09/24/18   Georgette Shell, MD  folic acid (FOLVITE) 1 MG tablet Take 1 tablet (1 mg total) by mouth daily. 09/24/18   Georgette Shell, MD  furosemide (LASIX) 40 MG tablet Take 1 tablet (40 mg total) by mouth daily. 09/24/18   Georgette Shell, MD  isosorbide-hydrALAZINE (BIDIL) 20-37.5 MG tablet Take 1 tablet by mouth 3 (three) times daily. 09/24/18   Georgette Shell, MD  levothyroxine (SYNTHROID) 75 MCG tablet Take 75 mcg by mouth daily. 09/07/18   [provider]  nicotine (NICODERM CQ - DOSED IN  MG/24 HOURS) 21 mg/24hr patch Place 1 patch (21 mg total) onto the skin daily. 09/24/18   Georgette Shell, MD  PARoxetine (PAXIL) 40 MG tablet TAKE 1.5 TABLETS BY MOUTH DAILY AT BEDTIME Patient taking differently: Take 60 mg by mouth at bedtime.  09/08/18   Cottle, Billey Co., MD  potassium chloride (K-DUR) 10 MEQ tablet Take 1 tablet (10 mEq total) by mouth daily. 09/24/18   Georgette Shell, MD  traZODone (DESYREL) 50 MG tablet Take 50-100 mg by mouth at bedtime.    [provider]    ALLERGIES:  Allergies  Allergen Reactions  . Codeine Nausea And Vomiting    SOCIAL HISTORY:  Social History   Tobacco Use  .  Smoking status: Current Every Day Smoker    Packs/day: 0.75    Years: 40.00    Pack years: 30.00  . Smokeless tobacco: Never Used  . Tobacco comment: smoking 15 cigarettes/day  Substance Use Topics  . Alcohol use: Yes    Comment: 1-2 gl wine / night - none since 07/2018    FAMILY HISTORY: Family History  Problem Relation Age of Onset  . Stroke Mother        Mini-strokes. Died @ 86.  . Dementia Mother   . Lung cancer Father        Died in his 8's  . Addison's disease Sister   . Hypertension Brother   . CAD Brother   . Hypertension Brother     EXAM: BP (!) 167/78 (BP Location: Left Arm)   Pulse 93   Temp 98.2 F (36.8 C)   Resp (!) 22   SpO2 96%  CONSTITUTIONAL: Alert and oriented and responds appropriately to questions. Well-appearing; well-nourished HEAD: Normocephalic EYES: Conjunctivae clear, pupils appear equal, EOMI ENT: normal nose; moist mucous membranes NECK: Supple, no meningismus, no nuchal rigidity, no LAD  CARD: RRR; S1 and S2 appreciated; no murmurs, no clicks, no rubs, no gallops RESP: Normal chest excursion without splinting or tachypnea; breath sounds clear and equal bilaterally; no wheezes, no rhonchi, no rales, no hypoxia or respiratory distress, speaking full sentences ABD/GI: Normal bowel sounds; non-distended; soft, tender to palpation of the right upper quadrant, no rebound, no guarding, no peritoneal signs, no hepatosplenomegaly BACK:  The back appears normal and is non-tender to palpation, there is no CVA tenderness EXT: Normal ROM in all joints; non-tender to palpation; no edema; normal capillary refill; no cyanosis, no calf tenderness or swelling    SKIN: Normal color for age and race; warm; no rash NEURO: Moves all extremities equally PSYCH: The patient's mood and manner are appropriate. Grooming and personal hygiene are appropriate.  MEDICAL DECISION MAKING: Patient here with complaints of chest pain, shortness of breath and vomiting.  No  diaphoresis or dizziness.  Does have risk factors for ACS.  EKG shows no ischemic abnormality.  Has history of CHF and recently diagnosed with a trace pericardial effusion on echocardiogram on June 6.  That time ejection fraction was 45 to 50%.  No signs or symptoms of cardiac tamponade at this time.  She does have some right upper quadrant tenderness.  Recent CT of the abdomen pelvis showed nephrolithiasis without other acute abnormality.  Will obtain an ultrasound to further evaluate the gallbladder.  Chest x-ray today shows reaccumulation of right-sided pleural effusion that was moderate in nature previously which may be contributing to symptoms.  Anticipate readmission for chest pain rule out and possible repeat thoracentesis.  Give nitroglycerin, Zofran for  symptomatic relief.  If ultrasound shows no surgical abnormality, will give aspirin.  ED PROGRESS: Right upper quadrant ultrasound shows no surgical pathology.  Will give aspirin for her chest pain.  Will discuss with medicine for admission for serial troponins and possible repeat thoracentesis.  Chest x-ray does redemonstrate opacity.  She is not having any infectious symptoms.  Will hold on antibiotics at this time.   Patient's chest pain completely resolved after nitroglycerin.  Will admit to medicine.   1:25 AM Discussed patient's case with hospitalist, Dr. Blaine Hamper.  I have recommended admission and patient (and family if present) agree with this plan. Admitting physician will place admission orders.   I reviewed all nursing notes, vitals, pertinent previous records, EKGs, lab and urine results, imaging (as available).      EKG Interpretation  Date/Time:  Wednesday September 27 2018 22:00:15 EDT Ventricular Rate:  93 PR Interval:  142 QRS Duration: 80 QT Interval:  370 QTC Calculation: 460 R Axis:   76 Text Interpretation:  Normal sinus rhythm Nonspecific T wave abnormality Abnormal ECG No significant change since last tracing other than  rate is slower Confirmed by Pryor Curia [id number]) on 09/27/2018 11:28:06 PM         Lacey Dotson, Delice Bison, DO 09/28/18 0310

## 2018-09-27 NOTE — ED Triage Notes (Signed)
Pt reports increased SOB, abd pain and emesis. Pt recently admitted and had fluid drained from her lungs. Emesis x5. Pt alert.

## 2018-09-27 NOTE — ED Notes (Addendum)
Pt's brother would like to be called with updates.   Raelyn [phone]

## 2018-09-28 ENCOUNTER — Ambulatory Visit: Payer: Medicare Other | Admitting: Orthopaedic Surgery

## 2018-09-28 ENCOUNTER — Emergency Department (HOSPITAL_COMMUNITY): Payer: Medicare Other

## 2018-09-28 DIAGNOSIS — E039 Hypothyroidism, unspecified: Secondary | ICD-10-CM

## 2018-09-28 DIAGNOSIS — F329 Major depressive disorder, single episode, unspecified: Secondary | ICD-10-CM | POA: Diagnosis present

## 2018-09-28 DIAGNOSIS — D509 Iron deficiency anemia, unspecified: Secondary | ICD-10-CM | POA: Diagnosis present

## 2018-09-28 DIAGNOSIS — R079 Chest pain, unspecified: Secondary | ICD-10-CM | POA: Diagnosis not present

## 2018-09-28 DIAGNOSIS — E611 Iron deficiency: Secondary | ICD-10-CM

## 2018-09-28 DIAGNOSIS — Z9111 Patient's noncompliance with dietary regimen: Secondary | ICD-10-CM | POA: Diagnosis not present

## 2018-09-28 DIAGNOSIS — J449 Chronic obstructive pulmonary disease, unspecified: Secondary | ICD-10-CM | POA: Diagnosis present

## 2018-09-28 DIAGNOSIS — E785 Hyperlipidemia, unspecified: Secondary | ICD-10-CM | POA: Diagnosis present

## 2018-09-28 DIAGNOSIS — Z1159 Encounter for screening for other viral diseases: Secondary | ICD-10-CM | POA: Diagnosis not present

## 2018-09-28 DIAGNOSIS — F32A Depression, unspecified: Secondary | ICD-10-CM | POA: Diagnosis present

## 2018-09-28 DIAGNOSIS — R109 Unspecified abdominal pain: Secondary | ICD-10-CM | POA: Diagnosis present

## 2018-09-28 DIAGNOSIS — J439 Emphysema, unspecified: Secondary | ICD-10-CM

## 2018-09-28 DIAGNOSIS — K219 Gastro-esophageal reflux disease without esophagitis: Secondary | ICD-10-CM | POA: Diagnosis present

## 2018-09-28 DIAGNOSIS — N183 Chronic kidney disease, stage 3 (moderate): Secondary | ICD-10-CM | POA: Diagnosis present

## 2018-09-28 DIAGNOSIS — J90 Pleural effusion, not elsewhere classified: Secondary | ICD-10-CM

## 2018-09-28 DIAGNOSIS — I5022 Chronic systolic (congestive) heart failure: Secondary | ICD-10-CM | POA: Diagnosis not present

## 2018-09-28 DIAGNOSIS — R1030 Lower abdominal pain, unspecified: Secondary | ICD-10-CM

## 2018-09-28 DIAGNOSIS — F419 Anxiety disorder, unspecified: Secondary | ICD-10-CM | POA: Diagnosis present

## 2018-09-28 DIAGNOSIS — I5023 Acute on chronic systolic (congestive) heart failure: Secondary | ICD-10-CM | POA: Diagnosis present

## 2018-09-28 DIAGNOSIS — Z79890 Hormone replacement therapy (postmenopausal): Secondary | ICD-10-CM | POA: Diagnosis not present

## 2018-09-28 DIAGNOSIS — R1011 Right upper quadrant pain: Secondary | ICD-10-CM | POA: Diagnosis present

## 2018-09-28 DIAGNOSIS — N179 Acute kidney failure, unspecified: Secondary | ICD-10-CM | POA: Diagnosis present

## 2018-09-28 DIAGNOSIS — Z8249 Family history of ischemic heart disease and other diseases of the circulatory system: Secondary | ICD-10-CM | POA: Diagnosis not present

## 2018-09-28 DIAGNOSIS — R071 Chest pain on breathing: Secondary | ICD-10-CM | POA: Diagnosis not present

## 2018-09-28 DIAGNOSIS — R0902 Hypoxemia: Secondary | ICD-10-CM | POA: Diagnosis present

## 2018-09-28 DIAGNOSIS — F17210 Nicotine dependence, cigarettes, uncomplicated: Secondary | ICD-10-CM | POA: Diagnosis present

## 2018-09-28 DIAGNOSIS — I509 Heart failure, unspecified: Secondary | ICD-10-CM | POA: Diagnosis not present

## 2018-09-28 DIAGNOSIS — Z79899 Other long term (current) drug therapy: Secondary | ICD-10-CM | POA: Diagnosis not present

## 2018-09-28 LAB — HEPATIC FUNCTION PANEL
ALT: 32 U/L (ref 0–44)
AST: 33 U/L (ref 15–41)
Albumin: 2.8 g/dL — ABNORMAL LOW (ref 3.5–5.0)
Alkaline Phosphatase: 61 U/L (ref 38–126)
Bilirubin, Direct: <0.1 mg/dL (ref 0.0–0.2)
Total Bilirubin: 0.8 mg/dL (ref 0.3–1.2)
Total Protein: 6.2 g/dL — ABNORMAL LOW (ref 6.5–8.1)

## 2018-09-28 LAB — LIPID PANEL
Cholesterol: 121 mg/dL (ref 0–200)
HDL: 29 mg/dL — ABNORMAL LOW (ref >40)
LDL Cholesterol: 72 mg/dL (ref 0–99)
Total CHOL/HDL Ratio: 4.2 RATIO
Triglycerides: 102 mg/dL (ref <150)
VLDL: 20 mg/dL (ref 0–40)

## 2018-09-28 LAB — RAPID URINE DRUG SCREEN, HOSP PERFORMED
Amphetamines: NOT DETECTED
Barbiturates: NOT DETECTED
Benzodiazepines: NOT DETECTED
Cocaine: NOT DETECTED
Opiates: NOT DETECTED
Tetrahydrocannabinol: NOT DETECTED

## 2018-09-28 LAB — HEMOGLOBIN A1C
Hgb A1c MFr Bld: 4.6 % — ABNORMAL LOW (ref 4.8–5.6)
Mean Plasma Glucose: 85.32 mg/dL

## 2018-09-28 LAB — TROPONIN I
Troponin I: <0.03 ng/mL (ref <0.03)
Troponin I: <0.03 ng/mL (ref <0.03)
Troponin I: <0.03 ng/mL (ref <0.03)

## 2018-09-28 LAB — LIPASE, BLOOD: Lipase: 37 U/L (ref 11–51)

## 2018-09-28 LAB — BRAIN NATRIURETIC PEPTIDE: B Natriuretic Peptide: 224.7 pg/mL — ABNORMAL HIGH (ref 0.0–100.0)

## 2018-09-28 MED ORDER — ONDANSETRON HCL 4 MG/2ML IJ SOLN
4.0000 mg | Freq: Three times a day (TID) | INTRAMUSCULAR | Status: DC | PRN
  Administered 2018-10-01: 18:00:00 4 mg via INTRAVENOUS
  Filled 2018-09-28: qty 2

## 2018-09-28 MED ORDER — FUROSEMIDE 10 MG/ML IJ SOLN
20.0000 mg | Freq: Two times a day (BID) | INTRAMUSCULAR | Status: DC
  Administered 2018-09-28 – 2018-09-29 (×3): 20 mg via INTRAVENOUS
  Filled 2018-09-28 (×3): qty 2

## 2018-09-28 MED ORDER — ASPIRIN 81 MG PO CHEW: 324.0000 mg | CHEWABLE_TABLET | Freq: Once | ORAL | Status: DC

## 2018-09-28 MED ORDER — ASPIRIN 81 MG PO CHEW
324.0000 mg | CHEWABLE_TABLET | Freq: Once | ORAL | Status: AC
  Administered 2018-09-28: 01:00:00 324 mg via ORAL
  Filled 2018-09-28: qty 4

## 2018-09-28 MED ORDER — NICOTINE 21 MG/24HR TD PT24
21.0000 mg | MEDICATED_PATCH | Freq: Every day | TRANSDERMAL | Status: DC
  Administered 2018-09-28 – 2018-10-04 (×7): 21 mg via TRANSDERMAL
  Filled 2018-09-28 (×7): qty 1

## 2018-09-28 MED ORDER — ASPIRIN 81 MG PO CHEW
81.0000 mg | CHEWABLE_TABLET | Freq: Every day | ORAL | Status: DC
  Administered 2018-09-28 – 2018-10-04 (×7): 81 mg via ORAL
  Filled 2018-09-28 (×7): qty 1

## 2018-09-28 MED ORDER — LEVOTHYROXINE SODIUM 75 MCG PO TABS
75.0000 ug | ORAL_TABLET | Freq: Every day | ORAL | Status: DC
  Administered 2018-09-28 – 2018-10-04 (×7): 75 ug via ORAL
  Filled 2018-09-28 (×7): qty 1

## 2018-09-28 MED ORDER — HEPARIN SODIUM (PORCINE) 5000 UNIT/ML IJ SOLN
5000.0000 [IU] | Freq: Three times a day (TID) | INTRAMUSCULAR | Status: DC
  Administered 2018-09-28 – 2018-10-01 (×9): 5000 [IU] via SUBCUTANEOUS
  Filled 2018-09-28 (×9): qty 1

## 2018-09-28 MED ORDER — CARVEDILOL 6.25 MG PO TABS
6.2500 mg | ORAL_TABLET | Freq: Two times a day (BID) | ORAL | Status: DC
  Administered 2018-09-28 – 2018-10-04 (×13): 6.25 mg via ORAL
  Filled 2018-09-28 (×14): qty 1

## 2018-09-28 MED ORDER — CLONAZEPAM 0.5 MG PO TABS
0.5000 mg | ORAL_TABLET | Freq: Two times a day (BID) | ORAL | Status: DC | PRN
  Administered 2018-09-28 – 2018-10-03 (×6): 0.5 mg via ORAL
  Filled 2018-09-28 (×6): qty 1

## 2018-09-28 MED ORDER — ALBUTEROL SULFATE (2.5 MG/3ML) 0.083% IN NEBU: 3.0000 mL | INHALATION_SOLUTION | RESPIRATORY_TRACT | Status: DC | PRN

## 2018-09-28 MED ORDER — TRAZODONE HCL 50 MG PO TABS
50.0000 mg | ORAL_TABLET | Freq: Every day | ORAL | Status: DC
  Administered 2018-09-28 – 2018-10-03 (×6): 100 mg via ORAL
  Filled 2018-09-28 (×6): qty 2

## 2018-09-28 MED ORDER — FUROSEMIDE 40 MG PO TABS
40.0000 mg | ORAL_TABLET | Freq: Every day | ORAL | Status: DC
  Filled 2018-09-28: qty 1

## 2018-09-28 MED ORDER — ATORVASTATIN CALCIUM 40 MG PO TABS
40.0000 mg | ORAL_TABLET | Freq: Every day | ORAL | Status: DC
  Administered 2018-09-28 – 2018-10-03 (×6): 40 mg via ORAL
  Filled 2018-09-28 (×6): qty 1

## 2018-09-28 MED ORDER — FUROSEMIDE 40 MG PO TABS: 40.0000 mg | ORAL_TABLET | Freq: Every day | ORAL | Status: DC

## 2018-09-28 MED ORDER — PAROXETINE HCL 20 MG PO TABS
60.0000 mg | ORAL_TABLET | Freq: Every day | ORAL | Status: DC
  Administered 2018-09-28 – 2018-10-03 (×6): 60 mg via ORAL
  Filled 2018-09-28 (×6): qty 3

## 2018-09-28 MED ORDER — FERROUS SULFATE 325 (65 FE) MG PO TABS
325.0000 mg | ORAL_TABLET | Freq: Every day | ORAL | Status: DC
  Administered 2018-09-28 – 2018-10-01 (×4): 325 mg via ORAL
  Filled 2018-09-28 (×4): qty 1

## 2018-09-28 MED ORDER — ISOSORB DINITRATE-HYDRALAZINE 20-37.5 MG PO TABS
1.0000 | ORAL_TABLET | Freq: Three times a day (TID) | ORAL | Status: DC
  Administered 2018-09-28 – 2018-10-04 (×19): 1 via ORAL
  Filled 2018-09-28 (×19): qty 1

## 2018-09-28 MED ORDER — HYDRALAZINE HCL 20 MG/ML IJ SOLN: 5.0000 mg | INTRAMUSCULAR | Status: DC | PRN

## 2018-09-28 MED ORDER — FOLIC ACID 1 MG PO TABS
1.0000 mg | ORAL_TABLET | Freq: Every day | ORAL | Status: DC
  Administered 2018-09-28 – 2018-10-04 (×7): 1 mg via ORAL
  Filled 2018-09-28 (×7): qty 1

## 2018-09-28 MED ORDER — FUROSEMIDE 10 MG/ML IJ SOLN
40.0000 mg | Freq: Once | INTRAMUSCULAR | Status: AC
  Administered 2018-09-28: 09:00:00 40 mg via INTRAVENOUS
  Filled 2018-09-28: qty 4

## 2018-09-28 MED ORDER — MORPHINE SULFATE (PF) 2 MG/ML IV SOLN: 2.0000 mg | INTRAVENOUS | Status: DC | PRN

## 2018-09-28 MED ORDER — ACETAMINOPHEN 325 MG PO TABS
650.0000 mg | ORAL_TABLET | Freq: Four times a day (QID) | ORAL | Status: DC | PRN
  Administered 2018-09-28 – 2018-10-04 (×6): 650 mg via ORAL
  Filled 2018-09-28 (×6): qty 2

## 2018-09-28 NOTE — Progress Notes (Addendum)
Please see H&P for full details. 68 y/o female with h/o COPD (not on home 02), hyperlipidemia, CHF with EF 45%, CKD 3, GERD, recent admission from 6/2-6/7 with pleural effusions requiring right sided thoracentesis with negative cultures but elevated RF at 600 (had rheumatologist appnt today) presents with c/o left sided chest pain, abd pain, sob and 1+leg edema. CXR shows slight increase in right pleural effusion, BNP 224. She has new o2 requirement of 2lits and still feels dyspneic. Recent CT CAP -ve. RUQ unremarkable. EKG NSR.Trop x2 -ve so far. Decreased breath sounds on rt base. Cards eval pending. Repeat EKG in am , continue IV diuresis for effusion. Will consider repeat thoracentesis if no improvement /worsens in spite of diuresis.Madaline Brilliant to eat if no cardiac procedures planned.

## 2018-09-28 NOTE — ED Notes (Signed)
Patient transported to Ultrasound 

## 2018-09-28 NOTE — H&P (Signed)
History and Physical    Tiffany Leon WIO:[id number] DOB: 1950/08/30 DOA: 09/27/2018  Referring MD/NP/PA:   PCP: Aretta Nip, MD   Patient coming from:  The patient is coming from home.  At baseline, pt is independent for most of ADL.        Chief Complaint: chest pain and abdominal pain  HPI: Tiffany Leon is a 68 y.o. female with medical history significant of CHF with EF of 45%, hyperlipidemia, COPD, iron deficiency anemia, tobacco abuse, pleural effusion, CKD 3, GERD, hypothyroidism and depression with anxiety, who presents with chest pain, abdominal pain.  Pt states that started having chest pain this PM. It is located mainly in the left side of chest, but also involving her whole front chest. It is constent 5/10 in severity initially, currently 0-1/47m, nonradiating. She cannot tell if her chest pain is pleuritic or not. No calf pain. She also report abdominal pain which is located in lower abdomen on both sides. It was 4/10 in severity ininitially, currently no AP. She has nausea and vomiting. She has had times of nonbiliary and nonbloody vomiting. No diarrhea. No fever or chill. Denies symptoms for UTI or unilateral weakness.   Of note, pt was recently admitted to the hospital from 6/2-6/7 due to SOB. She was found to have bilateral pleural effusion. She had thoracentesis and had 900 cc of fluid removed from right-sided pleural effusion. She also was found to have a trace pericardial effusion on echocardiogram in that admission. Pleural fluid culture was negative. A CT scan of the chest abdomen and pelvis did not show evidence of malignancy. In previous admission, pt was found to have rheumatoid factor 600 and was given referral to rheumatologist. Pt states that she has appointment with tomorrow.  ED Course: pt was found to have negative troponin, pending COVID-19 test, BNP 224, WBC 3.8, stable renal function, lipase 37, normal LFT, temperature normal, no tachycardia, oxygen saturation  95-97% on room air. RUQ-US negative. Pt is placed on tele bed for obs.   Chest x-ray showed: Slight interval increase in size of the right-sided pleural effusion. There is a persistent adjacent airspace opacity which has increased slightly from prior exam.    Review of Systems:   General: no fevers, chills, no body weight gain, has poor appetite, has fatigue HEENT: no blurry vision, hearing changes or sore throat Respiratory: has dyspnea, coughing, no wheezing CV: has chest pain, no palpitations GI: has nausea, vomiting, abdominal pain, no diarrhea, constipation GU: no dysuria, burning on urination, increased urinary frequency, hematuria  Ext: has leg edema Neuro: no unilateral weakness, numbness, or tingling, no vision change or hearing loss Skin: no rash, no skin tear. MSK: No muscle spasm, no deformity, no limitation of range of movement in spin Heme: No easy bruising.  Travel history: No recent long distant travel.  Allergy:  Allergies  Allergen Reactions  . Codeine Nausea And Vomiting    Past Medical History:  Diagnosis Date  . Acute kidney injury ([place])    a. 09/2018  . Anxiety   . Aortic atherosclerosis ([place])    a. 09/2018 noted on Chest CT.  Marland Kitchen Chronic combined systolic (congestive) and diastolic (congestive) heart failure ([place])    a. 09/2018 Echo: EF 45-50%, diff HK. Triv to small circumfirential pericardial eff.  . Chronic DOE (dyspnea on exertion)   . Claudication [facility])    a. Bilat hip claudication since ~ 2019.  Marland Kitchen Depression   . Emphysema lung ([place])   . Exertional  angina ([place])    a. Ex angina since ~ 2019.  Marland Kitchen GERD (gastroesophageal reflux disease)   . Hiatal hernia   . History of bronchitis   . History of kidney stones   . Hypothyroidism   . Pleural effusion    a. 09/2018 s/p thoracentesis-->972ml.  Marland Kitchen Resting tremor   . Tobacco abuse     Past Surgical History:  Procedure Laterality Date  . BREAST LUMPECTOMY WITH RADIOACTIVE SEED LOCALIZATION Left 12/28/2017    Procedure: BREAST LUMPECTOMY WITH RADIOACTIVE SEED LOCALIZATION;  Surgeon: Jovita Kussmaul, MD;  Location: [facility];  Service: General;  Laterality: Left;  . DG THUMB RIGHT HAND ([place] [facility])    . IR THORACENTESIS ASP PLEURAL SPACE W/IMG GUIDE  09/20/2018  . KIDNEY STONE SURGERY      Social History:  reports that she has been smoking. She has a 30.00 pack-year smoking history. She has never used smokeless tobacco. She reports current alcohol use. She reports previous drug use.  Family History:  Family History  Problem Relation Age of Onset  . Stroke Mother        Mini-strokes. Died @ 44.  . Dementia Mother   . Lung cancer Father        Died in his 33's  . Addison's disease Sister   . Hypertension Brother   . CAD Brother   . Hypertension Brother      Prior to Admission medications   Medication Sig Start Date End Date Taking? Authorizing Provider  aspirin 81 MG chewable tablet Chew 1 tablet (81 mg total) by mouth daily. 09/25/18  Yes Georgette Shell, MD  atorvastatin (LIPITOR) 40 MG tablet Take 1 tablet (40 mg total) by mouth daily at 6 PM. 09/24/18  Yes Georgette Shell, MD  carvedilol (COREG) 6.25 MG tablet Take 1 tablet (6.25 mg total) by mouth 2 (two) times daily with a meal. 09/24/18  Yes Georgette Shell, MD  clonazePAM (KLONOPIN) 0.5 MG tablet Take 0.5 mg by mouth 2 (two) times daily as needed for anxiety.   Yes [provider]  clotrimazole (LOTRIMIN) 1 % cream Apply topically 2 (two) times daily. 09/24/18  Yes Georgette Shell, MD  ferrous sulfate 325 (65 FE) MG tablet Take 1 tablet (325 mg total) by mouth daily with breakfast. 09/24/18  Yes Georgette Shell, MD  folic acid (FOLVITE) 1 MG tablet Take 1 tablet (1 mg total) by mouth daily. 09/24/18  Yes Georgette Shell, MD  furosemide (LASIX) 40 MG tablet Take 1 tablet (40 mg total) by mouth daily. 09/24/18  Yes Georgette Shell, MD  isosorbide-hydrALAZINE (BIDIL) 20-37.5 MG tablet Take 1 tablet by mouth 3  (three) times daily. 09/24/18  Yes Georgette Shell, MD  levothyroxine (SYNTHROID) 75 MCG tablet Take 75 mcg by mouth daily. 09/07/18  Yes [provider]  nicotine (NICODERM CQ - DOSED IN MG/24 HOURS) 21 mg/24hr patch Place 1 patch (21 mg total) onto the skin daily. 09/24/18  Yes Georgette Shell, MD  PARoxetine (PAXIL) 40 MG tablet TAKE 1.5 TABLETS BY MOUTH DAILY AT BEDTIME Patient taking differently: Take 60 mg by mouth at bedtime.  09/08/18  Yes Cottle, Billey Co., MD  potassium chloride (K-DUR) 10 MEQ tablet Take 1 tablet (10 mEq total) by mouth daily. 09/24/18  Yes Georgette Shell, MD  traZODone (DESYREL) 50 MG tablet Take 50-100 mg by mouth at bedtime.   Yes [provider]    Physical Exam: Vitals:  09/28/18 0200 09/28/18 0230 09/28/18 0300 09/28/18 0400  BP: (!) 154/77 (!) 145/71 (!) 153/71 (!) 162/83  Pulse:    95  Resp: (!) 26 (!) 28 (!) 25   Temp:    97.8 F (36.6 C)  TempSrc:    Oral  SpO2:    96%  Weight:    62.1 kg   General: Not in acute distress HEENT:       Eyes: PERRL, EOMI, no scleral icterus.       ENT: No discharge from the ears and nose, no pharynx injection, no tonsillar enlargement.        Neck: No JVD, no bruit, no mass felt. Heme: No neck lymph node enlargement. Cardiac: S1/S2, RRR, No murmurs, No gallops or rubs. Respiratory:  No rales, wheezing, rhonchi or rubs. GI: Soft, nondistended, no tenderness. No rebound pain, no organomegaly, BS present. GU: No hematuria Ext: has 1+ pitting leg edema bilaterally. 2+DP/PT pulse bilaterally. Musculoskeletal: No joint deformities, No joint redness or warmth, no limitation of ROM in spin. Skin: No rashes.  Neuro: Alert, oriented X3, cranial nerves II-XII grossly intact, moves all extremities normally. Psych: Patient is not psychotic, no suicidal or hemocidal ideation.  Labs on Admission: I have personally reviewed following labs and imaging studies  CBC: Recent Labs  Lab 09/22/18 0527  09/23/18 0437 09/24/18 0703 09/27/18 2214  WBC 2.9* 3.7* 3.0* 3.8*  NEUTROABS 1.3*  --   --   --   HGB 7.5* 9.0* 7.6* 8.3*  HCT 22.8* 26.4* 22.8* 25.8*  MCV 97.4 97.1 97.4 99.6  PLT 170 199 200 132   Basic Metabolic Panel: Recent Labs  Lab 09/22/18 0527 09/23/18 0437 09/24/18 0703 09/27/18 2214  NA 143 143 141 141  K 3.7 3.5 4.0 3.9  CL 113* 109 110 111  CO2 18* 23 22 20*  GLUCOSE 89 105* 99 116*  BUN 30* 28* 28* 32*  CREATININE 1.70* 1.72* 1.89* 1.49*  CALCIUM 8.5* 8.9 8.6* 8.8*   GFR: Estimated Creatinine Clearance: 32.9 mL/min (A) (by C-G formula based on SCr of 1.49 mg/dL (H)). Liver Function Tests: Recent Labs  Lab 09/22/18 0527 09/23/18 0437 09/27/18 2355  AST 12* 14* 33  ALT 11 15 32  ALKPHOS 51 63 61  BILITOT 0.7 0.6 0.8  PROT 5.5* 6.8 6.2*  ALBUMIN 2.5* 3.0* 2.8*   Recent Labs  Lab 09/27/18 2355  LIPASE 37   No results for input(s): AMMONIA in the last 168 hours. Coagulation Profile: No results for input(s): INR, PROTIME in the last 168 hours. Cardiac Enzymes: Recent Labs  Lab 09/27/18 2214 09/28/18 0328  TROPONINI <0.03 <0.03   BNP (last 3 results) No results for input(s): PROBNP in the last 8760 hours. HbA1C: Recent Labs    09/28/18 0328  HGBA1C 4.6*   CBG: No results for input(s): GLUCAP in the last 168 hours. Lipid Profile: Recent Labs    09/28/18 0328  CHOL 121  HDL 29*  LDLCALC 72  TRIG 102  CHOLHDL 4.2   Thyroid Function Tests: No results for input(s): TSH, T4TOTAL, FREET4, T3FREE, THYROIDAB in the last 72 hours. Anemia Panel: No results for input(s): VITAMINB12, FOLATE, FERRITIN, TIBC, IRON, RETICCTPCT in the last 72 hours. Urine analysis:    Component Value Date/Time   COLORURINE YELLOW 09/20/2018 2100   APPEARANCEUR HAZY (A) 09/20/2018 2100   LABSPEC 1.018 09/20/2018 2100   PHURINE 5.0 09/20/2018 2100   GLUCOSEU NEGATIVE 09/20/2018 2100   HGBUR LARGE (A) 09/20/2018 2100  BILIRUBINUR NEGATIVE 09/20/2018 2100    KETONESUR 5 (A) 09/20/2018 2100   PROTEINUR >=300 (A) 09/20/2018 2100   NITRITE NEGATIVE 09/20/2018 2100   LEUKOCYTESUR NEGATIVE 09/20/2018 2100   Sepsis Labs: @LABRCNTIP (procalcitonin:4,lacticidven:4) ) Recent Results (from the past 240 hour(s))  SARS Coronavirus 2 (CEPHEID - Performed in [place] hospital lab), Hosp Order     Status: None   Collection Time: 09/20/18  2:55 AM   Specimen: Nasopharyngeal Swab  Result Value Ref Range Status   SARS Coronavirus 2 NEGATIVE NEGATIVE Final    Comment: (NOTE) If result is NEGATIVE SARS-CoV-2 target nucleic acids are NOT DETECTED. The SARS-CoV-2 RNA is generally detectable in upper and lower  respiratory specimens during the acute phase of infection. The lowest  concentration of SARS-CoV-2 viral copies this assay can detect is 250  copies / mL. A negative result does not preclude SARS-CoV-2 infection  and should not be used as the sole basis for treatment or other  patient management decisions.  A negative result may occur with  improper specimen collection / handling, submission of specimen other  than nasopharyngeal swab, presence of viral mutation(s) within the  areas targeted by this assay, and inadequate number of viral copies  (<250 copies / mL). A negative result must be combined with clinical  observations, patient history, and epidemiological information. If result is POSITIVE SARS-CoV-2 target nucleic acids are DETECTED. The SARS-CoV-2 RNA is generally detectable in upper and lower  respiratory specimens dur ing the acute phase of infection.  Positive  results are indicative of active infection with SARS-CoV-2.  Clinical  correlation with patient history and other diagnostic information is  necessary to determine patient infection status.  Positive results do  not rule out bacterial infection or co-infection with other viruses. If result is PRESUMPTIVE POSTIVE SARS-CoV-2 nucleic acids MAY BE PRESENT.   A presumptive  positive result was obtained on the submitted specimen  and confirmed on repeat testing.  While 2019 novel coronavirus  (SARS-CoV-2) nucleic acids may be present in the submitted sample  additional confirmatory testing may be necessary for epidemiological  and / or clinical management purposes  to differentiate between  SARS-CoV-2 and other Sarbecovirus currently known to infect humans.  If clinically indicated additional testing with an alternate test  methodology [phone]) is advised. The SARS-CoV-2 RNA is generally  detectable in upper and lower respiratory sp ecimens during the acute  phase of infection. The expected result is Negative. Fact Sheet for Patients:  StrictlyIdeas.no Fact Sheet for Healthcare Providers: BankingDealers.co.za This test is not yet approved or cleared by the [place] FDA and has been authorized for detection and/or diagnosis of SARS-CoV-2 by FDA under an Emergency Use Authorization (EUA).  This EUA will remain in effect (meaning this test can be used) for the duration of the COVID-19 declaration under Section 564(b)(1) of the Act, 21 U.S.C. section 360bbb-3(b)(1), unless the authorization is terminated or revoked sooner. Performed at [facility], [place] [address], [place], [place] [address]   Culture, body fluid-bottle     Status: None   Collection Time: 09/20/18  1:23 PM   Specimen: Pleura  Result Value Ref Range Status   Specimen Description PLEURAL RIGHT  Final   Special Requests NONE  Final   Culture   Final    NO GROWTH 5 DAYS Performed at [facility], [address] [place], [place] [address]    Report Status 09/25/2018 FINAL  Final  Gram stain  Status: None   Collection Time: 09/20/18  1:23 PM   Specimen: Pleura  Result Value Ref Range Status   Specimen Description PLEURAL RIGHT  Final   Special Requests NONE  Final   Gram Stain   Final    ABUNDANT WBC PRESENT,  PREDOMINANTLY MONONUCLEAR NO ORGANISMS SEEN Performed at [facility], [address], [place], [place] [address]    Report Status 09/20/2018 FINAL  Final     Radiological Exams on Admission: Dg Chest 2 View  Result Date: 09/27/2018 CLINICAL DATA:  Increasing shortness of breath EXAM: CHEST - 2 VIEW COMPARISON:  Chest x-ray dated 09/23/2018 FINDINGS: Heart size is borderline enlarged. There is no pneumothorax. The lungs are hyperexpanded. There is an airspace opacity at the right lung base with a small parapneumonic effusion. There is no acute osseous abnormality. IMPRESSION: Slight interval increase in size of the right-sided pleural effusion. There is a persistent adjacent airspace opacity which has increased slightly from prior exam. This could represent atelectasis or infiltrate. Electronically Signed   By: Constance Holster M.D.   On: 09/27/2018 22:48   US Abdomen Limited Ruq  Result Date: 09/28/2018 CLINICAL DATA:  Initial evaluation for acute right upper quadrant pain. EXAM: ULTRASOUND ABDOMEN LIMITED RIGHT UPPER QUADRANT COMPARISON:  Prior CT from 09/01/2018 FINDINGS: Gallbladder: No gallstones or wall thickening visualized. No sonographic Murphy sign noted by sonographer. Common bile duct: Diameter: 2.1 mm Liver: No focal lesion identified. Within normal limits in parenchymal echogenicity. Portal vein is patent on color Doppler imaging with normal direction of blood flow towards the liver. Incidental note made of a right pleural effusion, partially visualized. IMPRESSION: 1. Normal right upper quadrant ultrasound. 2. Incidental right pleural effusion. Electronically Signed   By: Jeannine Boga M.D.   On: 09/28/2018 00:43     EKG: Independently reviewed.  Sinus rhythm, low voltage, anteroseptal infarction pattern, nonspecific T wave change.   Assessment/Plan Principal Problem:   Chest pain Active Problems:   CKD (chronic kidney disease), stage III (HCC)   Nausea and  vomiting   Hypothyroidism   Pleural effusion   Tobacco abuse   COPD (chronic obstructive pulmonary disease) (HCC)   Abdominal pain   HLD (hyperlipidemia)   Depression   Chronic systolic CHF (congestive heart failure) (HCC)   Iron deficiency   Chest pain: Patient has atypical chest pain.  Her chest pain has resolved when I saw her on the floor.  Etiology is not clear.  Initial troponin negative.  Patient has 1+ leg edema and elevated BNP 224, indicating possible mild CHF exacerbation, therefore she may have demand ischemia due to CHF.  - Placed on telemetry bed for observation - cycle CE q6 x3 and repeat EKG in the am  - prn Nitroglycerin, Morphine, and aspirin, lipitor and coreg  - Risk factor stratification: will check FLP and A1C , UDS  Chronic systolic CHF: 2D echo on 10/22/2829 showed EF of 45-50%.  Patient has a 1+ leg edema, elevated BNP 224, indicating possible mild CHF exacerbation. -Give 1 dose of IV Lasix 40 mg x 1 -then continue home Lasix 40 mg daily -on Bidil  Pleural effusion: CXR showed slight interval increase in size of the right-sided pleural effusion. Previously work up showed no evidence of infection or malignancy. Today, pt has no fever. Less likely to have infection. -observe and f/u with PCP  HLD: -Lipitor  Hypothyroidism: -synthroid  CKD (chronic kidney disease), stage III: Stable.  Baseline creatinine 1.6-1.8.  Her creatinine  is 1.49, BUN 32 -Follow-up by BMP  COPD: pt has SOB, but no wheezing or rhonchi, dose not seem to have COPD exacerbation. -bronchodilators  Nausea and vomiting and abdominal pain: Etiology is not clear.  Lipase normal.  Abdominal ultrasound is negative.  Her abdominal pain has resolved.  Currently does not have tenderness on physical examination. - Observation and supportive care  Hypothyroidism: -Synthroid  Tobacco abuse: -Nicotine patch  HLD (hyperlipidemia): -Lipitor  Depression and anxiety -Continue home Klonopin and  Paxil  Iron deficiency anemia: Hgb stable, 7.6 on 09/24/18-->8.3 today. -continue iron supplement   DVT ppx: SQ Heparin       Code Status: Full code Family Communication: None at bed side.     Disposition Plan:  Anticipate discharge back to previous home environment Consults called:  none Admission status: Obs / tele    Date of Service 09/28/2018    [facility]   If 7PM-7AM, please contact night-coverage [email or web address] Password [facility] 09/28/2018, 7:39 AM

## 2018-09-28 NOTE — ED Notes (Signed)
ED TO INPATIENT HANDOFF REPORT  ED Nurse Name and Phone #: Callie Fielding #[phone]  S Name/Age/Gender Tiffany Leon 68 y.o. female Room/Bed: 027C/027C  Code Status   Code Status: Prior  Home/SNF/Other Home Patient oriented to: self, place, time and situation Is this baseline? Yes   Triage Complete: Triage complete  Chief Complaint SOB emesis CP  Triage Note Pt reports increased SOB, abd pain and emesis. Pt recently admitted and had fluid drained from her lungs. Emesis x5. Pt alert.    Allergies Allergies  Allergen Reactions  . Codeine Nausea And Vomiting    Level of Care/Admitting Diagnosis ED Disposition    ED Disposition Condition Comment   Admit  The patient appears reasonably stabilized for admission considering the current resources, flow, and capabilities available in the ED at this time, and I doubt any other [facility] requiring further screening and/or treatment in the ED prior to admission is  present.       B Medical/Surgery History Past Medical History:  Diagnosis Date  . Acute kidney injury ([place])    a. 09/2018  . Anxiety   . Aortic atherosclerosis ([place])    a. 09/2018 noted on Chest CT.  Marland Kitchen Chronic combined systolic (congestive) and diastolic (congestive) heart failure ([place])    a. 09/2018 Echo: EF 45-50%, diff HK. Triv to small circumfirential pericardial eff.  . Chronic DOE (dyspnea on exertion)   . Claudication [facility])    a. Bilat hip claudication since ~ 2019.  Marland Kitchen Depression   . Emphysema lung ([place])   . Exertional angina ([facility])    a. Ex angina since ~ 2019.  Marland Kitchen GERD (gastroesophageal reflux disease)   . Hiatal hernia   . History of bronchitis   . History of kidney stones   . Hypothyroidism   . Pleural effusion    a. 09/2018 s/p thoracentesis-->976ml.  Marland Kitchen Resting tremor   . Tobacco abuse    Past Surgical History:  Procedure Laterality Date  . BREAST LUMPECTOMY WITH RADIOACTIVE SEED LOCALIZATION Left 12/28/2017   Procedure: BREAST LUMPECTOMY WITH  RADIOACTIVE SEED LOCALIZATION;  Surgeon: Jovita Kussmaul, MD;  Location: [facility];  Service: General;  Laterality: Left;  . DG THUMB RIGHT HAND ([place] [facility])    . IR THORACENTESIS ASP PLEURAL SPACE W/IMG GUIDE  09/20/2018  . KIDNEY STONE SURGERY       A IV Location/Drains/Wounds Patient Lines/Drains/Airways Status   Active Line/Drains/Airways    Name:   Placement date:   Placement time:   Site:   Days:   Peripheral IV 09/20/18 Right Antecubital   09/20/18    0342    Antecubital   8   Peripheral IV 09/28/18 Right Antecubital   09/28/18    0016    Antecubital   less than 1   Incision (Closed) 12/28/17 Breast Left   12/28/17    0935     274          Intake/Output Last 24 hours No intake or output data in the 24 hours ending 09/28/18 0251  Labs/Imaging Results for orders placed or performed during the hospital encounter of 09/27/18 (from the past 48 hour(s))  Basic metabolic panel     Status: Abnormal   Collection Time: 09/27/18 10:14 PM  Result Value Ref Range   Sodium 141 135 - 145 mmol/L   Potassium 3.9 3.5 - 5.1 mmol/L   Chloride 111 98 - 111 mmol/L   CO2 20 (L) 22 - 32 mmol/L   Glucose, Bld 116 (  H) 70 - 99 mg/dL   BUN 32 (H) 8 - 23 mg/dL   Creatinine, Ser 1.49 (H) 0.44 - 1.00 mg/dL   Calcium 8.8 (L) 8.9 - 10.3 mg/dL   GFR calc non Af Amer 35 (L) >60 mL/min   GFR calc Af Amer 41 (L) >60 mL/min   Anion gap 10 5 - 15    Comment: Performed at [facility] [address], [place], [place] [address]  CBC     Status: Abnormal   Collection Time: 09/27/18 10:14 PM  Result Value Ref Range   WBC 3.8 (L) 4.0 - 10.5 K/uL   RBC 2.59 (L) 3.87 - 5.11 MIL/uL   Hemoglobin 8.3 (L) 12.0 - 15.0 g/dL   HCT 25.8 (L) 36.0 - 46.0 %   MCV 99.6 80.0 - 100.0 fL   MCH 32.0 26.0 - 34.0 pg   MCHC 32.2 30.0 - 36.0 g/dL   RDW 13.5 11.5 - 15.5 %   Platelets 252 150 - 400 K/uL   nRBC 0.0 0.0 - 0.2 %    Comment: Performed at [facility], [place] [address] [place], [place] [address]  Troponin I -  ONCE - STAT     Status: None   Collection Time: 09/27/18 10:14 PM  Result Value Ref Range   Troponin I <0.03 <0.03 ng/mL    Comment: Performed at [facility] [address] [place], [place] [address]  Hepatic function panel     Status: Abnormal   Collection Time: 09/27/18 11:55 PM  Result Value Ref Range   Total Protein 6.2 (L) 6.5 - 8.1 g/dL   Albumin 2.8 (L) 3.5 - 5.0 g/dL   AST 33 15 - 41 U/L   ALT 32 0 - 44 U/L   Alkaline Phosphatase 61 38 - 126 U/L   Total Bilirubin 0.8 0.3 - 1.2 mg/dL   Bilirubin, Direct <0.1 0.0 - 0.2 mg/dL   Indirect Bilirubin NOT CALCULATED 0.3 - 0.9 mg/dL    Comment: Performed at [facility] [address] [place], [place] [address]  Lipase, blood     Status: None   Collection Time: 09/27/18 11:55 PM  Result Value Ref Range   Lipase 37 11 - 51 U/L    Comment: Performed at [facility] [address], [place], [place] [address]   Dg Chest 2 View  Result Date: 09/27/2018 CLINICAL DATA:  Increasing shortness of breath EXAM: CHEST - 2 VIEW COMPARISON:  Chest x-ray dated 09/23/2018 FINDINGS: Heart size is borderline enlarged. There is no pneumothorax. The lungs are hyperexpanded. There is an airspace opacity at the right lung base with a small parapneumonic effusion. There is no acute osseous abnormality. IMPRESSION: Slight interval increase in size of the right-sided pleural effusion. There is a persistent adjacent airspace opacity which has increased slightly from prior exam. This could represent atelectasis or infiltrate. Electronically Signed   By: Constance Holster M.D.   On: 09/27/2018 22:48   US Abdomen Limited Ruq  Result Date: 09/28/2018 CLINICAL DATA:  Initial evaluation for acute right upper quadrant pain. EXAM: ULTRASOUND ABDOMEN LIMITED RIGHT UPPER QUADRANT COMPARISON:  Prior CT from 09/01/2018 FINDINGS: Gallbladder: No gallstones or wall thickening visualized. No sonographic Murphy sign noted by sonographer. Common bile duct:  Diameter: 2.1 mm Liver: No focal lesion identified. Within normal limits in parenchymal echogenicity. Portal vein is patent on color Doppler imaging with normal direction of blood flow towards the liver. Incidental  note made of a right pleural effusion, partially visualized. IMPRESSION: 1. Normal right upper quadrant ultrasound. 2. Incidental right pleural effusion. Electronically Signed   By: Jeannine Boga M.D.   On: 09/28/2018 00:43    Pending Labs Unresulted Labs (From admission, onward)    Start     Ordered   09/28/18 0251  Troponin I - Now Then Q6H  Now then every 6 hours,   R (with STAT occurrences)     09/28/18 0250   09/28/18 0151  Novel Coronavirus, NAA (hospital order; send-out to ref lab)  Once,   R     09/28/18 0151   09/27/18 2353  Urinalysis, Routine w reflex microscopic  ONCE - STAT,   STAT     09/27/18 2352   Signed and Held  Hemoglobin A1c  Tomorrow morning,   R     Signed and Held   Signed and Held  Lipid panel  Tomorrow morning,   R    Comments: Please obtain as a fasting lipid panel - should not have eaten/ drank food for 8 hours prior to labs.    Signed and Held   Signed and Held  Rapid urine drug screen (hospital performed)  Once,   R     Signed and Held          Vitals/Pain Today's Vitals   09/28/18 0034 09/28/18 0130 09/28/18 0200 09/28/18 0230  BP:  137/70 (!) 154/77 (!) 145/71  Pulse:      Resp:  (!) 21 (!) 26 (!) 28  Temp: 97.7 F (36.5 C)     TempSrc: Rectal     SpO2:      PainSc:        Isolation Precautions No active isolations  Medications Medications  nitroGLYCERIN (NITROSTAT) SL tablet 0.4 mg (0.4 mg Sublingual Given 09/28/18 0035)  morphine 2 MG/ML injection 2 mg (has no administration in time range)  ondansetron (ZOFRAN) injection 4 mg (has no administration in time range)  hydrALAZINE (APRESOLINE) injection 5 mg (has no administration in time range)  acetaminophen (TYLENOL) tablet 650 mg (has no administration in time range)   sodium chloride flush (NS) 0.9 % injection 3 mL (3 mLs Intravenous Given 09/28/18 0017)  ondansetron (ZOFRAN) injection 4 mg (4 mg Intravenous Given 09/28/18 0033)  aspirin chewable tablet 324 mg (324 mg Oral Given 09/28/18 0122)    Mobility walks Low fall risk   Focused Assessments Cardiac Assessment Handoff:    Lab Results  Component Value Date   TROPONINI <0.03 09/27/2018   No results found for: DDIMER Does the Patient currently have chest pain? no    R Recommendations: See Admitting Provider Note  Report given to:   Additional Notes:  Alert oriented x 4

## 2018-09-29 DIAGNOSIS — I5023 Acute on chronic systolic (congestive) heart failure: Principal | ICD-10-CM

## 2018-09-29 DIAGNOSIS — I509 Heart failure, unspecified: Secondary | ICD-10-CM

## 2018-09-29 LAB — BASIC METABOLIC PANEL
Anion gap: 10 (ref 5–15)
BUN: 35 mg/dL — ABNORMAL HIGH (ref 8–23)
CO2: 21 mmol/L — ABNORMAL LOW (ref 22–32)
Calcium: 8.3 mg/dL — ABNORMAL LOW (ref 8.9–10.3)
Chloride: 109 mmol/L (ref 98–111)
Creatinine, Ser: 2.01 mg/dL — ABNORMAL HIGH (ref 0.44–1.00)
GFR calc Af Amer: 28 mL/min — ABNORMAL LOW (ref >60)
GFR calc non Af Amer: 25 mL/min — ABNORMAL LOW (ref >60)
Glucose, Bld: 105 mg/dL — ABNORMAL HIGH (ref 70–99)
Potassium: 4.1 mmol/L (ref 3.5–5.1)
Sodium: 140 mmol/L (ref 135–145)

## 2018-09-29 LAB — CBC
HCT: 23 % — ABNORMAL LOW (ref 36.0–46.0)
Hemoglobin: 7.5 g/dL — ABNORMAL LOW (ref 12.0–15.0)
MCH: 33.2 pg (ref 26.0–34.0)
MCHC: 32.6 g/dL (ref 30.0–36.0)
MCV: 101.8 fL — ABNORMAL HIGH (ref 80.0–100.0)
Platelets: 208 10*3/uL (ref 150–400)
RBC: 2.26 MIL/uL — ABNORMAL LOW (ref 3.87–5.11)
RDW: 13.7 % (ref 11.5–15.5)
WBC: 3.3 10*3/uL — ABNORMAL LOW (ref 4.0–10.5)
nRBC: 0 % (ref 0.0–0.2)

## 2018-09-29 LAB — NOVEL CORONAVIRUS, NAA (HOSP ORDER, SEND-OUT TO REF LAB; TAT 18-24 HRS): SARS-CoV-2, NAA: NOT DETECTED

## 2018-09-29 MED ORDER — PANTOPRAZOLE SODIUM 20 MG PO TBEC
20.0000 mg | DELAYED_RELEASE_TABLET | Freq: Every day | ORAL | Status: DC
  Administered 2018-09-29 – 2018-10-04 (×5): 20 mg via ORAL
  Filled 2018-09-29 (×7): qty 1

## 2018-09-29 NOTE — Progress Notes (Addendum)
PROGRESS NOTE    Thera Basden  YIF:[id number]  DOB: 07-Oct-1950  DOA: 09/27/2018 PCP: Aretta Nip, MD  Brief Narrative:  68 y/o female with h/o COPD (not on home 02), hyperlipidemia, CHF with EF 45%, CKD 3, GERD, recent admission from 6/2-6/7 with pleural effusions requiring right sided thoracentesis with negative cultures but elevated RF at 600 (had rheumatologist appnt 6/11) presents with c/o left sided chest pain, abd pain, sob and 1+leg edema. CXR shows slight increase in right pleural effusion, BNP 224. She has new o2 requirement of 2lits and still feels dyspneic. Recent CT CAP -ve. RUQ unremarkable. EKG NSR.Trop x2 -ve so far.  Subjective:  Patient feels better this morning. Required o2 last night but off O2 currently, saturating 94% RA. Reports improved leg swelling. She however reports minimal diuresis and feels weak. Been in bed mostly.  Objective: Vitals:   09/28/18 1220 09/28/18 2300 09/29/18 0553 09/29/18 0756  BP: (!) 141/75 118/72 (!) 147/83 (!) 146/77  Pulse: 90 84 94 80  Resp: 19 17 18    Temp: 98.4 F (36.9 C) 98.8 F (37.1 C) 98.8 F (37.1 C)   TempSrc: Oral Oral Oral   SpO2: 94% 94% 94% 97%  Weight:   62.7 kg     Intake/Output Summary (Last 24 hours) at 09/29/2018 1221 Last data filed at 09/29/2018 1100 Gross per 24 hour  Intake 240 ml  Output 500 ml  Net -260 ml   Filed Weights   09/28/18 0400 09/29/18 0553  Weight: 62.1 kg 62.7 kg    Physical Examination:  General exam: Appears calm and comfortable  Respiratory system: Clear to auscultation. Respiratory effort normal. Cardiovascular system: S1 & S2 heard, RRR. No JVD, murmurs, rubs, gallops or clicks. No pedal edema. Gastrointestinal system: Abdomen is nondistended, soft and nontender. No organomegaly or masses felt. Normal bowel sounds heard. Central nervous system: Alert and oriented. No focal neurological deficits. Extremities: Symmetric 5 x 5 power. Skin: No rashes, lesions or  ulcers Psychiatry: Judgement and insight appear normal. Mood & affect appropriate.   Data Reviewed: I have personally reviewed following labs and imaging studies  CBC: Recent Labs  Lab 09/23/18 0437 09/24/18 0703 09/27/18 2214  WBC 3.7* 3.0* 3.8*  HGB 9.0* 7.6* 8.3*  HCT 26.4* 22.8* 25.8*  MCV 97.1 97.4 99.6  PLT 199 200 867   Basic Metabolic Panel: Recent Labs  Lab 09/23/18 0437 09/24/18 0703 09/27/18 2214  NA 143 141 141  K 3.5 4.0 3.9  CL 109 110 111  CO2 23 22 20*  GLUCOSE 105* 99 116*  BUN 28* 28* 32*  CREATININE 1.72* 1.89* 1.49*  CALCIUM 8.9 8.6* 8.8*   GFR: Estimated Creatinine Clearance: 32.9 mL/min (A) (by C-G formula based on SCr of 1.49 mg/dL (H)). Liver Function Tests: Recent Labs  Lab 09/23/18 0437 09/27/18 2355  AST 14* 33  ALT 15 32  ALKPHOS 63 61  BILITOT 0.6 0.8  PROT 6.8 6.2*  ALBUMIN 3.0* 2.8*   Recent Labs  Lab 09/27/18 2355  LIPASE 37   No results for input(s): AMMONIA in the last 168 hours. Coagulation Profile: No results for input(s): INR, PROTIME in the last 168 hours. Cardiac Enzymes: Recent Labs  Lab 09/27/18 2214 09/28/18 0328 09/28/18 0900 09/28/18 1437  TROPONINI <0.03 <0.03 <0.03 <0.03   BNP (last 3 results) No results for input(s): PROBNP in the last 8760 hours. HbA1C: Recent Labs    09/28/18 0328  HGBA1C 4.6*   CBG: No results for input(s):  GLUCAP in the last 168 hours. Lipid Profile: Recent Labs    09/28/18 0328  CHOL 121  HDL 29*  LDLCALC 72  TRIG 102  CHOLHDL 4.2   Thyroid Function Tests: No results for input(s): TSH, T4TOTAL, FREET4, T3FREE, THYROIDAB in the last 72 hours. Anemia Panel: No results for input(s): VITAMINB12, FOLATE, FERRITIN, TIBC, IRON, RETICCTPCT in the last 72 hours. Sepsis Labs: No results for input(s): PROCALCITON, LATICACIDVEN in the last 168 hours.  Recent Results (from the past 240 hour(s))  SARS Coronavirus 2 (CEPHEID - Performed in [facility] hospital lab), Hosp  Order     Status: None   Collection Time: 09/20/18  2:55 AM   Specimen: Nasopharyngeal Swab  Result Value Ref Range Status   SARS Coronavirus 2 NEGATIVE NEGATIVE Final    Comment: (NOTE) If result is NEGATIVE SARS-CoV-2 target nucleic acids are NOT DETECTED. The SARS-CoV-2 RNA is generally detectable in upper and lower  respiratory specimens during the acute phase of infection. The lowest  concentration of SARS-CoV-2 viral copies this assay can detect is 250  copies / mL. A negative result does not preclude SARS-CoV-2 infection  and should not be used as the sole basis for treatment or other  patient management decisions.  A negative result may occur with  improper specimen collection / handling, submission of specimen other  than nasopharyngeal swab, presence of viral mutation(s) within the  areas targeted by this assay, and inadequate number of viral copies  (<250 copies / mL). A negative result must be combined with clinical  observations, patient history, and epidemiological information. If result is POSITIVE SARS-CoV-2 target nucleic acids are DETECTED. The SARS-CoV-2 RNA is generally detectable in upper and lower  respiratory specimens dur ing the acute phase of infection.  Positive  results are indicative of active infection with SARS-CoV-2.  Clinical  correlation with patient history and other diagnostic information is  necessary to determine patient infection status.  Positive results do  not rule out bacterial infection or co-infection with other viruses. If result is PRESUMPTIVE POSTIVE SARS-CoV-2 nucleic acids MAY BE PRESENT.   A presumptive positive result was obtained on the submitted specimen  and confirmed on repeat testing.  While 2019 novel coronavirus  (SARS-CoV-2) nucleic acids may be present in the submitted sample  additional confirmatory testing may be necessary for epidemiological  and / or clinical management purposes  to differentiate between  SARS-CoV-2  and other Sarbecovirus currently known to infect humans.  If clinically indicated additional testing with an alternate test  methodology [phone]) is advised. The SARS-CoV-2 RNA is generally  detectable in upper and lower respiratory sp ecimens during the acute  phase of infection. The expected result is Negative. Fact Sheet for Patients:  StrictlyIdeas.no Fact Sheet for Healthcare Providers: BankingDealers.co.za This test is not yet approved or cleared by the [place] FDA and has been authorized for detection and/or diagnosis of SARS-CoV-2 by FDA under an Emergency Use Authorization (EUA).  This EUA will remain in effect (meaning this test can be used) for the duration of the COVID-19 declaration under Section 564(b)(1) of the Act, 21 U.S.C. section 360bbb-3(b)(1), unless the authorization is terminated or revoked sooner. Performed at [facility], [place] [address], [place], [place] [address]   Culture, body fluid-bottle     Status: None   Collection Time: 09/20/18  1:23 PM   Specimen: Pleura  Result Value Ref Range Status   Specimen Description PLEURAL RIGHT  Final   Special Requests  NONE  Final   Culture   Final    NO GROWTH 5 DAYS Performed at [facility], [place] [address] [place], [place] [address]    Report Status 09/25/2018 FINAL  Final  Gram stain     Status: None   Collection Time: 09/20/18  1:23 PM   Specimen: Pleura  Result Value Ref Range Status   Specimen Description PLEURAL RIGHT  Final   Special Requests NONE  Final   Gram Stain   Final    ABUNDANT WBC PRESENT, PREDOMINANTLY MONONUCLEAR NO ORGANISMS SEEN Performed at [facility], [place] [address], [place], [place] [address]    Report Status 09/20/2018 FINAL  Final  Novel Coronavirus, NAA (hospital order; send-out to ref lab)     Status: None   Collection Time: 09/28/18  1:51 AM  Result Value Ref Range Status   SARS-CoV-2, NAA NOT DETECTED  NOT DETECTED Final    Comment: (NOTE) This test was developed and its performance characteristics determined by [employer]. This test has not been FDA cleared or approved. This test has been authorized by FDA under an Emergency Use Authorization (EUA). This test is only authorized for the duration of time the declaration that circumstances exist justifying the authorization of the emergency use of in vitro diagnostic tests for detection of SARS-CoV-2 virus and/or diagnosis of COVID-19 infection under section 564(b)(1) of the Act, 21 U.S.C. 694WNI-6(E)(7), unless the authorization is terminated or revoked sooner. When diagnostic testing is negative, the possibility of a false negative result should be considered in the context of a patient's recent exposures and the presence of clinical signs and symptoms consistent with COVID-19. An individual without symptoms of COVID-19 and who is not shedding SARS-CoV-2 virus would expect to have a negative (not detected) result in this assay. Performed  At: [facility] [address] [place], [place] [id number] Rush Farmer MD WE:[id number]    [place]  Final    Comment: Performed at [facility], [place] [address], [place],  [address]      Radiology Studies: Dg Chest 2 View  Result Date: 09/27/2018 CLINICAL DATA:  Increasing shortness of breath EXAM: CHEST - 2 VIEW COMPARISON:  Chest x-ray dated 09/23/2018 FINDINGS: Heart size is borderline enlarged. There is no pneumothorax. The lungs are hyperexpanded. There is an airspace opacity at the right lung base with a small parapneumonic effusion. There is no acute osseous abnormality. IMPRESSION: Slight interval increase in size of the right-sided pleural effusion. There is a persistent adjacent airspace opacity which has increased slightly from prior exam. This could represent atelectasis or infiltrate. Electronically Signed   By: Constance Holster M.D.   On: 09/27/2018 22:48   US Abdomen Limited Ruq  Result Date: 09/28/2018 CLINICAL DATA:  Initial evaluation for acute right upper quadrant pain. EXAM: ULTRASOUND ABDOMEN LIMITED RIGHT UPPER QUADRANT COMPARISON:  Prior CT from 09/01/2018 FINDINGS: Gallbladder: No gallstones or wall thickening visualized. No sonographic Murphy sign noted by sonographer. Common bile duct: Diameter: 2.1 mm Liver: No focal lesion identified. Within normal limits in parenchymal echogenicity. Portal vein is patent on color Doppler imaging with normal direction of blood flow towards the liver. Incidental note made of a right pleural effusion, partially visualized. IMPRESSION: 1. Normal right upper quadrant ultrasound. 2. Incidental right pleural effusion. Electronically Signed   By: Jeannine Boga M.D.   On: 09/28/2018 00:43        Scheduled Meds:  aspirin  324 mg  Oral Once   aspirin  81 mg Oral Daily   atorvastatin  40 mg Oral q1800   carvedilol  6.25 mg Oral BID WC   ferrous sulfate  325 mg Oral Q breakfast   folic acid  1 mg Oral Daily   furosemide  20 mg Intravenous BID   heparin  5,000 Units Subcutaneous Q8H   isosorbide-hydrALAZINE  1 tablet Oral TID   levothyroxine  75 mcg Oral Q0600   nicotine  21 mg Transdermal Daily   PARoxetine  60 mg Oral QHS   traZODone  50-100 mg Oral QHS   Continuous Infusions:  Assessment & Plan:    1. Acute on chronic systolic CHF: 2D echo on 0/12/3816 showed EF of 45-50%.  Patient has a 1+ leg edema, elevated BNP 224, and CXR showing worsening right pleural effusion. Continue IV diuresis for effusion. CXR in am. Will consider repeat thoracentesis if no improvement /worsens in spite of diuresis.Marland Kitchen   2. Atypical chest pain: Resolved. Trop x2 -ve. Continue prn Nitroglycerin, Morphine, and aspirin, lipitor and coreg.  Repeat EKG with no significant change. Qtc 460 msecs  3.  Recurrent Pleural effusion: Required thoracentesis in recent admission and  was discharged on Lasix 40 mg which helped initially but was diuresing much after couple of days per patient report. CXR in this presentation showed slight interval increase in size of the right-sided pleural effusion. Previously work up showed no evidence of infection or malignancy.  Will diurese IV and repeat CXR in am  4. Chronic Kidney Disease, stage III: Stable.  Baseline creatinine 1.6-1.8.  Her creatinine is 1.49, BUN 32. Watch on diuresis. Labs today and in am  5.COPD: pt has SOB, but no wheezing or rhonchi, dose not seem to have COPD exacerbation.-bronchodilators . +smoker. On nicotine patch  6.  Iron deficiency anemia: No signs of active bleeding. ?AOCD sec to CKD. Will obtain fe studies. Hgb stable, 7.6 on 09/24/18-->8.3 on admission. Repeat labs and transfuse PRN. .continue iron supplement  7. Nausea/vomiting/abdominal pain: No complaints today. Tolerating diet.  Recent CT abd/pelvis unremarkable. USG  RUQ 6/11 unremarkable  8 . HLD: Lipitor  9. Depression/Anxiety-Continue home Klonopin and Paxil  10. Hypothyroidism:-Synthroid   DVT prophylaxis: Heparin Code Status: Full code Family / Patient Communication: will d/w brother Eddie Dibbles on patients request Disposition Plan: Home when medically clear  Update: Held IV lasix as creatinine up to 2. Will re-evaluate in am    LOS: 1 day    Time spent: 35 minutes    Gu[place]ford Shi, MD [facility] Pager [phone]  If 7PM-7AM, please contact night-coverage [email or web address] Password [email or web address] 09/29/2018, 12:21 PM

## 2018-09-29 NOTE — Plan of Care (Signed)

## 2018-09-30 ENCOUNTER — Inpatient Hospital Stay (HOSPITAL_COMMUNITY): Payer: Medicare Other

## 2018-09-30 DIAGNOSIS — N179 Acute kidney failure, unspecified: Secondary | ICD-10-CM

## 2018-09-30 LAB — BASIC METABOLIC PANEL
Anion gap: 9 (ref 5–15)
BUN: 34 mg/dL — ABNORMAL HIGH (ref 8–23)
CO2: 22 mmol/L (ref 22–32)
Calcium: 8.3 mg/dL — ABNORMAL LOW (ref 8.9–10.3)
Chloride: 111 mmol/L (ref 98–111)
Creatinine, Ser: 1.9 mg/dL — ABNORMAL HIGH (ref 0.44–1.00)
GFR calc Af Amer: 30 mL/min — ABNORMAL LOW (ref >60)
GFR calc non Af Amer: 26 mL/min — ABNORMAL LOW (ref >60)
Glucose, Bld: 89 mg/dL (ref 70–99)
Potassium: 3.9 mmol/L (ref 3.5–5.1)
Sodium: 142 mmol/L (ref 135–145)

## 2018-09-30 LAB — HEMOGLOBIN AND HEMATOCRIT, BLOOD
HCT: 21.5 % — ABNORMAL LOW (ref 36.0–46.0)
Hemoglobin: 7 g/dL — ABNORMAL LOW (ref 12.0–15.0)

## 2018-09-30 NOTE — Evaluation (Signed)
Physical Therapy Evaluation Patient Details Name: Tiffany Leon MRN: [id number] DOB: 09-24-1950 Today's Date: 09/30/2018   History of Present Illness  Tiffany Leon is a 68 y.o. female with recent admission for pleural effusion admitted with chest pain with increased effusion. PMHx: emphysema, GERD, depression, hypothyroidism, CKD, CHF, anemia and anxiety  Clinical Impression  Pt pleasant in bed on arrival with SPO2 92% on RA. Pt with report of generalized fatigue due to pleural effusion with decreased activity tolerance, strength and function who will benefit from acute therapy to maximize mobility, independence and safety. With gait pt desaturated to 88% and with slow gait and pursed lip breathing able to maintain 90% with return to 95% on RA at rest. HR 85-93     Follow Up Recommendations Home health PT    Equipment Recommendations  None recommended by PT    Recommendations for Other Services       Precautions / Restrictions Precautions Precautions: Fall Precaution Comments: watch sats      Mobility  Bed Mobility Overal bed mobility: Independent             General bed mobility comments: incr time and effort  Transfers Overall transfer level: Independent                  Ambulation/Gait Ambulation/Gait assistance: Min guard Gait Distance (Feet): 180 Feet Assistive device: Rolling walker (2 wheeled) Gait Pattern/deviations: Step-through pattern;Decreased stride length   Gait velocity interpretation: 1.31 - 2.62 ft/sec, indicative of limited community ambulator General Gait Details: Pt able to walk 90' then required standing rest leaning against wall with cues for breathing technique to recover sats as SPO2 dropped to 88% on RA with gait. 90%on return gait with decreased speed and increased cues for pursed lip breathing. Cues for position in RW  Stairs            Wheelchair Mobility    Modified Rankin (Stroke Patients Only)       Balance Overall  balance assessment: Needs assistance   Sitting balance-Leahy Scale: Good     Standing balance support: Single extremity supported Standing balance-Leahy Scale: Poor Standing balance comment: relies on UE support intermittently.                              Pertinent Vitals/Pain Pain Assessment: No/denies pain    Home Living Family/patient expects to be discharged to:: Private residence Living Arrangements: Spouse/significant other Available Help at Discharge: Family;Available 24 hours/day Type of Home: House Home Access: Stairs to enter Entrance Stairs-Rails: None Entrance Stairs-Number of Steps: 3 Home Layout: One level Home Equipment: Environmental consultant - 4 wheels      Prior Function Level of Independence: Independent with assistive device(s)         Comments: using rollator since last admission     Hand Dominance        Extremity/Trunk Assessment   Upper Extremity Assessment Upper Extremity Assessment: Generalized weakness    Lower Extremity Assessment Lower Extremity Assessment: Generalized weakness    Cervical / Trunk Assessment Cervical / Trunk Assessment: Normal  Communication   Communication: No difficulties  Cognition Arousal/Alertness: Awake/alert Behavior During Therapy: Flat affect Overall Cognitive Status: Within Functional Limits for tasks assessed                                        General  Comments      Exercises General Exercises - Lower Extremity Long Arc Quad: AROM;Both;Other (comment)(8 reps, limited by fatigue)   Assessment/Plan    PT Assessment Patient needs continued PT services  PT Problem List Decreased activity tolerance;Decreased balance;Decreased knowledge of use of DME;Decreased safety awareness;Cardiopulmonary status limiting activity;Decreased mobility       PT Treatment Interventions DME instruction;Gait training;Functional mobility training;Therapeutic activities;Therapeutic exercise;Balance  training;Patient/family education;Stair training    PT Goals (Current goals can be found in the Care Plan section)  Acute Rehab PT Goals Patient Stated Goal: return home and be faster again PT Goal Formulation: With patient Time For Goal Achievement: 10/14/18 Potential to Achieve Goals: Good    Frequency Min 3X/week   Barriers to discharge Decreased caregiver support      Co-evaluation               AM-PAC PT "6 Clicks" Mobility  Outcome Measure Help needed turning from your back to your side while in a flat bed without using bedrails?: None Help needed moving from lying on your back to sitting on the side of a flat bed without using bedrails?: None Help needed moving to and from a bed to a chair (including a wheelchair)?: None Help needed standing up from a chair using your arms (e.g., wheelchair or bedside chair)?: A Little Help needed to walk in hospital room?: A Little Help needed climbing 3-5 steps with a railing? : A Little 6 Click Score: 21    End of Session   Activity Tolerance: Patient tolerated treatment well Patient left: in chair;with call bell/phone within reach Nurse Communication: Mobility status PT Visit Diagnosis: Muscle weakness (generalized) (M62.81);Unsteadiness on feet (R26.81);Other abnormalities of gait and mobility (R26.89)    Time: 4497-5300 PT Time Calculation (min) (ACUTE ONLY): 19 min   Charges:   PT Evaluation $PT Eval Moderate Complexity: 1 Mod          [place], PT Acute Rehabilitation Services Pager: [phone] Office: [phone]   Naphtali Riede B Roniya Tetro 09/30/2018, 12:06 PM

## 2018-09-30 NOTE — Plan of Care (Signed)
  Problem: Clinical Measurements: Goal: Will remain free from infection Outcome: Progressing Goal: Respiratory complications will improve Outcome: Progressing   Problem: Activity: Goal: Risk for activity intolerance will decrease Outcome: Progressing   Problem: Nutrition: Goal: Adequate nutrition will be maintained Outcome: Progressing   Problem: Coping: Goal: Level of anxiety will decrease Outcome: Progressing   Problem: Pain Managment: Goal: General experience of comfort will improve Outcome: Progressing   Problem: Safety: Goal: Ability to remain free from injury will improve Outcome: Progressing   Problem: Skin Integrity: Goal: Risk for impaired skin integrity will decrease Outcome: Progressing

## 2018-09-30 NOTE — Progress Notes (Addendum)
PROGRESS NOTE    Tiffany Leon  TIW:[id number]  DOB: [birth year]  DOA: 09/27/2018 PCP: Aretta Nip, MD  Brief Narrative:  68 y/o female with h/o COPD (not on home 02), hyperlipidemia, CHF with EF 45%, CKD 3, GERD, recent admission from 6/2-6/7 with pleural effusions requiring right sided thoracentesis with negative cultures but elevated RF at 600 (had rheumatologist appnt 6/11) presents with c/o left sided chest pain, abd pain, sob and 1+leg edema. CXR shows slight increase in right pleural effusion, BNP 224. She has new o2 requirement of 2lits and still feels dyspneic. Recent CT CAP -ve. RUQ unremarkable. EKG NSR.Trop x2 -ve so far.  Subjective:  Patient feels better overall this morning but reports exertional dyspnea and feeling tight on deep breath. Required 02 briefly overnight but off O2 currently, saturating 94% RA. Reports improved leg swelling. Continues to feel weak with no appetite. Been in bed mostly. No further nausea/vomiting/diarrhea. Eating well  Objective: Vitals:   09/29/18 2243 09/30/18 0435 09/30/18 0744 09/30/18 1157  BP: (!) 122/56 136/74 (!) 161/98   Pulse: 84 87 88 93  Resp: 16 16    Temp: (!) 97.4 F (36.3 C) 98 F (36.7 C)    TempSrc: Oral Oral    SpO2: 90% 98% 93% 92%  Weight:        Intake/Output Summary (Last 24 hours) at 09/30/2018 1515 Last data filed at 09/30/2018 0800 Gross per 24 hour  Intake 240 ml  Output 200 ml  Net 40 ml   Filed Weights   09/28/18 0400 09/29/18 0553  Weight: 62.1 kg 62.7 kg    Physical Examination:  General exam: Appears calm and comfortable  Respiratory system: Decreased breath sounds at bilateral bases right >left Respiratory effort normal. Cardiovascular system: S1 & S2 heard, RRR. No JVD, murmurs, rubs, gallops or clicks. No pedal edema. Gastrointestinal system: Abdomen is nondistended, soft and nontender. No organomegaly or masses felt. Normal bowel sounds heard. Central nervous system: Alert and oriented. No  focal neurological deficits. Extremities: Symmetric 5 x 5 power. Skin: No rashes, lesions or ulcers Psychiatry: Judgement and insight appear normal. Mood & affect appropriate.   Data Reviewed: I have personally reviewed following labs and imaging studies  CBC: Recent Labs  Lab 09/24/18 0703 09/27/18 2214 09/29/18 1326 09/30/18 0408  WBC 3.0* 3.8* 3.3*  --   HGB 7.6* 8.3* 7.5* 7.0*  HCT 22.8* 25.8* 23.0* 21.5*  MCV 97.4 99.6 101.8*  --   PLT 200 252 208  --    Basic Metabolic Panel: Recent Labs  Lab 09/24/18 0703 09/27/18 2214 09/29/18 1326 09/30/18 0408  NA 141 141 140 142  K 4.0 3.9 4.1 3.9  CL 110 111 109 111  CO2 22 20* 21* 22  GLUCOSE 99 116* 105* 89  BUN 28* 32* 35* 34*  CREATININE 1.89* 1.49* 2.01* 1.90*  CALCIUM 8.6* 8.8* 8.3* 8.3*   GFR: Estimated Creatinine Clearance: 25.8 mL/min (A) (by C-G formula based on SCr of 1.9 mg/dL (H)). Liver Function Tests: Recent Labs  Lab 09/27/18 2355  AST 33  ALT 32  ALKPHOS 61  BILITOT 0.8  PROT 6.2*  ALBUMIN 2.8*   Recent Labs  Lab 09/27/18 2355  LIPASE 37   No results for input(s): AMMONIA in the last 168 hours. Coagulation Profile: No results for input(s): INR, PROTIME in the last 168 hours. Cardiac Enzymes: Recent Labs  Lab 09/27/18 2214 09/28/18 0328 09/28/18 0900 09/28/18 1437  TROPONINI <0.03 <0.03 <0.03 <0.03   BNP (  last 3 results) No results for input(s): PROBNP in the last 8760 hours. HbA1C: Recent Labs    09/28/18 0328  HGBA1C 4.6*   CBG: No results for input(s): GLUCAP in the last 168 hours. Lipid Profile: Recent Labs    09/28/18 0328  CHOL 121  HDL 29*  LDLCALC 72  TRIG 102  CHOLHDL 4.2   Thyroid Function Tests: No results for input(s): TSH, T4TOTAL, FREET4, T3FREE, THYROIDAB in the last 72 hours. Anemia Panel: No results for input(s): VITAMINB12, FOLATE, FERRITIN, TIBC, IRON, RETICCTPCT in the last 72 hours. Sepsis Labs: No results for input(s): PROCALCITON, LATICACIDVEN  in the last 168 hours.  Recent Results (from the past 240 hour(s))  Novel Coronavirus, NAA (hospital order; send-out to ref lab)     Status: None   Collection Time: 09/28/18  1:51 AM  Result Value Ref Range Status   SARS-CoV-2, NAA NOT DETECTED NOT DETECTED Final    Comment: (NOTE) This test was developed and its performance characteristics determined by [employer]. This test has not been FDA cleared or approved. This test has been authorized by FDA under an Emergency Use Authorization (EUA). This test is only authorized for the duration of time the declaration that circumstances exist justifying the authorization of the emergency use of in vitro diagnostic tests for detection of SARS-CoV-2 virus and/or diagnosis of COVID-19 infection under section 564(b)(1) of the Act, 21 U.S.C. 295AOZ-3(Y)(8), unless the authorization is terminated or revoked sooner. When diagnostic testing is negative, the possibility of a false negative result should be considered in the context of a patient's recent exposures and the presence of clinical signs and symptoms consistent with COVID-19. An individual without symptoms of COVID-19 and who is not shedding SARS-CoV-2 virus would expect to have a negative (not detected) result in this assay. Performed  At: [facility] [address] [place], [place] [id number] Rush Farmer MD XB:[id number]    [place]andas  Final    Comment: Performed at [facility], [place] [address], [place], [place] [address]      Radiology Studies: Dg Chest 2 View  Result Date: 09/30/2018 CLINICAL DATA:  Right pleural effusion. EXAM: CHEST - 2 VIEW COMPARISON:  09/27/2018 FINDINGS: The cardiac silhouette remains borderline enlarged. A small right pleural effusion has mildly enlarged with persistent adjacent airspace opacity and evidence of volume loss. There may be a trace left pleural effusion and minimal left basilar atelectasis no  pneumothorax is identified. IMPRESSION: Mildly increased size of small right pleural effusion with right basilar atelectasis or infiltrate. Electronically Signed   By: Logan Bores M.D.   On: 09/30/2018 08:26        Scheduled Meds: . aspirin  324 mg Oral Once  . aspirin  81 mg Oral Daily  . atorvastatin  40 mg Oral q1800  . carvedilol  6.25 mg Oral BID WC  . ferrous sulfate  325 mg Oral Q breakfast  . folic acid  1 mg Oral Daily  . heparin  5,000 Units Subcutaneous Q8H  . isosorbide-hydrALAZINE  1 tablet Oral TID  . levothyroxine  75 mcg Oral Q0600  . nicotine  21 mg Transdermal Daily  . pantoprazole  20 mg Oral Daily  . PARoxetine  60 mg Oral QHS  . traZODone  50-100 mg Oral QHS   Continuous Infusions:  Assessment & Plan:    1. Acute on chronic systolic CHF: 2D echo on 06/23/6438 showed EF of 45-50%.  Patient has a 1+ leg edema,  elevated BNP 224, and repeat CXR showing bilateral pleural effusions and worsening right pleural effusion. On IV diuresis. O2 Prn. Saturating 94% currently on RA  2. Atypical chest pain: Resolved. Trop x2 -ve. Continue prn Nitroglycerin, Morphine, and aspirin, lipitor and coreg.  Repeat EKG with no significant change. Qtc 460 msecs  3.  Recurrent Pleural effusion: Required thoracentesis in recent admission and was discharged on Lasix 40 mg which helped initially but wasnt diuresing much after couple of days per patient report. CXR today shows slight interval increase in size of the right-sided pleural effusion.Will d/w radiology regarding possible benefit of repeat thoracentesis and need for decubitus film to rule out loculation. Previously work up showed no evidence of infection or malignancy.    4. Acute on chronic renal failure, stage III:  Baseline creatinine 1.6-1.8.  Her creatinine jumped from 1.49 to 2 yesterday and 1.9 today likely secondary to intravascular depletion. Will hold Lasix for today,Labs in am  5.COPD: pt has SOB, but no wheezing or  rhonchi, dose not seem to have COPD exacerbation.-bronchodilators . +smoker. On nicotine patch  6.  Iron deficiency anemia: No signs of active bleeding. ?AOCD sec to CKD. Will obtain fe studies. Hgb stable, 7.6 on 09/24/18-->8.3 on admission. Repeat labs and transfuse PRN. .continue iron supplement  7. Nausea/vomiting/abdominal pain: No complaints today. Tolerating diet.  Recent CT abd/pelvis unremarkable. USG  RUQ 6/11 unremarkable  8 . HLD: Lipitor  9. Depression/Anxiety-Continue home Klonopin and Paxil  10. Hypothyroidism:-Synthroid   DVT prophylaxis: Heparin Code Status: Full code Family / Patient Communication:  d/w brother Eddie Dibbles 6/12 on patients request Disposition Plan: Home when medically clear    LOS: 2 days    Time spent: 35 minutes    Guilford Shi, MD [facility] Pager [phone]  If 7PM-7AM, please contact night-coverage [email or web address] Password [facility] 09/30/2018, 3:15 PM

## 2018-10-01 ENCOUNTER — Inpatient Hospital Stay (HOSPITAL_COMMUNITY): Payer: Medicare Other

## 2018-10-01 LAB — PREPARE RBC (CROSSMATCH)

## 2018-10-01 LAB — VITAMIN B12: Vitamin B-12: 864 pg/mL (ref 180–914)

## 2018-10-01 LAB — IRON AND TIBC
Iron: 89 ug/dL (ref 28–170)
Saturation Ratios: 41 % — ABNORMAL HIGH (ref 10.4–31.8)
TIBC: 218 ug/dL — ABNORMAL LOW (ref 250–450)
UIBC: 129 ug/dL

## 2018-10-01 LAB — RETICULOCYTES
Immature Retic Fract: 9.7 % (ref 2.3–15.9)
RBC.: 2.46 MIL/uL — ABNORMAL LOW (ref 3.87–5.11)
Retic Count, Absolute: 62.5 10*3/uL (ref 19.0–186.0)
Retic Ct Pct: 2.5 % (ref 0.4–3.1)

## 2018-10-01 LAB — FOLATE: Folate: 15.8 ng/mL (ref >5.9)

## 2018-10-01 LAB — BASIC METABOLIC PANEL
Anion gap: 7 (ref 5–15)
BUN: 31 mg/dL — ABNORMAL HIGH (ref 8–23)
CO2: 23 mmol/L (ref 22–32)
Calcium: 8.5 mg/dL — ABNORMAL LOW (ref 8.9–10.3)
Chloride: 111 mmol/L (ref 98–111)
Creatinine, Ser: 1.62 mg/dL — ABNORMAL HIGH (ref 0.44–1.00)
GFR calc Af Amer: 37 mL/min — ABNORMAL LOW (ref >60)
GFR calc non Af Amer: 32 mL/min — ABNORMAL LOW (ref >60)
Glucose, Bld: 129 mg/dL — ABNORMAL HIGH (ref 70–99)
Potassium: 4.2 mmol/L (ref 3.5–5.1)
Sodium: 141 mmol/L (ref 135–145)

## 2018-10-01 LAB — FERRITIN: Ferritin: 492 ng/mL — ABNORMAL HIGH (ref 11–307)

## 2018-10-01 MED ORDER — SODIUM CHLORIDE 0.9% IV SOLUTION
Freq: Once | INTRAVENOUS | Status: AC
  Administered 2018-10-01: 14:00:00 via INTRAVENOUS

## 2018-10-01 NOTE — Progress Notes (Addendum)
PROGRESS NOTE    Tiffany Leon  MOQ:[id number]  DOB: 09/25/1950  DOA: 09/27/2018 PCP: Aretta Nip, MD  Brief Narrative:  68 y/o female with h/o COPD (not on home 02), hyperlipidemia, CHF with EF 45%, CKD 3, GERD, recent admission from 6/2-6/7 with pleural effusions requiring right sided thoracentesis with negative cultures but elevated RF at 600 (had rheumatologist appnt 6/11) presents with c/o left sided chest pain, abd pain, sob and 1+leg edema. CXR shows slight increase in right pleural effusion, BNP 224. She has new o2 requirement of 2lits and still feels dyspneic. Recent CT CAP -ve. RUQ unremarkable. EKG NSR.Trop x2 -ve so far.  Subjective:  No o2 requirements overnight and patient feels better today. Lasix held yesterday in view of rising creatinine. Continues to feel weak with no appetite. Seen PT. No further nausea/vomiting/diarrhea. Eating better  Objective: Vitals:   09/30/18 0744 09/30/18 1157 09/30/18 2157 10/01/18 0424  BP: (!) 161/98  (!) 152/81 118/61  Pulse: 88 93  85  Resp:    16  Temp:    98.1 F (36.7 C)  TempSrc:    Oral  SpO2: 93% 92%  94%  Weight:    63.3 kg   No intake or output data in the 24 hours ending 10/01/18 1112 Filed Weights   09/28/18 0400 09/29/18 0553 10/01/18 0424  Weight: 62.1 kg 62.7 kg 63.3 kg    Physical Examination:  General exam: Appears calm and comfortable  Respiratory system: Decreased breath sounds at bilateral bases right >left Respiratory effort normal. Cardiovascular system: S1 & S2 heard, RRR. No JVD, murmurs, rubs, gallops or clicks. No pedal edema. Gastrointestinal system: Abdomen is nondistended, soft and nontender. No organomegaly or masses felt. Normal bowel sounds heard. Central nervous system: Alert and oriented. No focal neurological deficits. Extremities: Symmetric 5 x 5 power. Skin: No rashes, lesions or ulcers Psychiatry: Judgement and insight appear normal. Mood & affect appropriate.   Data Reviewed: I  have personally reviewed following labs and imaging studies  CBC: Recent Labs  Lab 09/27/18 2214 09/29/18 1326 09/30/18 0408  WBC 3.8* 3.3*  --   HGB 8.3* 7.5* 7.0*  HCT 25.8* 23.0* 21.5*  MCV 99.6 101.8*  --   PLT 252 208  --    Basic Metabolic Panel: Recent Labs  Lab 09/27/18 2214 09/29/18 1326 09/30/18 0408  NA 141 140 142  K 3.9 4.1 3.9  CL 111 109 111  CO2 20* 21* 22  GLUCOSE 116* 105* 89  BUN 32* 35* 34*  CREATININE 1.49* 2.01* 1.90*  CALCIUM 8.8* 8.3* 8.3*   GFR: Estimated Creatinine Clearance: 25.8 mL/min (A) (by C-G formula based on SCr of 1.9 mg/dL (H)). Liver Function Tests: Recent Labs  Lab 09/27/18 2355  AST 33  ALT 32  ALKPHOS 61  BILITOT 0.8  PROT 6.2*  ALBUMIN 2.8*   Recent Labs  Lab 09/27/18 2355  LIPASE 37   No results for input(s): AMMONIA in the last 168 hours. Coagulation Profile: No results for input(s): INR, PROTIME in the last 168 hours. Cardiac Enzymes: Recent Labs  Lab 09/27/18 2214 09/28/18 0328 09/28/18 0900 09/28/18 1437  TROPONINI <0.03 <0.03 <0.03 <0.03   BNP (last 3 results) No results for input(s): PROBNP in the last 8760 hours. HbA1C: No results for input(s): HGBA1C in the last 72 hours. CBG: No results for input(s): GLUCAP in the last 168 hours. Lipid Profile: No results for input(s): CHOL, HDL, LDLCALC, TRIG, CHOLHDL, LDLDIRECT in the last 72 hours. Thyroid  Function Tests: No results for input(s): TSH, T4TOTAL, FREET4, T3FREE, THYROIDAB in the last 72 hours. Anemia Panel: No results for input(s): VITAMINB12, FOLATE, FERRITIN, TIBC, IRON, RETICCTPCT in the last 72 hours. Sepsis Labs: No results for input(s): PROCALCITON, LATICACIDVEN in the last 168 hours.  Recent Results (from the past 240 hour(s))  Novel Coronavirus, NAA (hospital order; send-out to ref lab)     Status: None   Collection Time: 09/28/18  1:51 AM  Result Value Ref Range Status   SARS-CoV-2, NAA NOT DETECTED NOT DETECTED Final    Comment:  (NOTE) This test was developed and its performance characteristics determined by [employer]. This test has not been FDA cleared or approved. This test has been authorized by FDA under an Emergency Use Authorization (EUA). This test is only authorized for the duration of time the declaration that circumstances exist justifying the authorization of the emergency use of in vitro diagnostic tests for detection of SARS-CoV-2 virus and/or diagnosis of COVID-19 infection under section 564(b)(1) of the Act, 21 U.S.C. 694WNI-6(E)(7), unless the authorization is terminated or revoked sooner. When diagnostic testing is negative, the possibility of a false negative result should be considered in the context of a patient's recent exposures and the presence of clinical signs and symptoms consistent with COVID-19. An individual without symptoms of COVID-19 and who is not shedding SARS-CoV-2 virus would expect to have a negative (not detected) result in this assay. Performed  At: [facility] [address] [place], [place] [id number] Rush Farmer MD WE:[id number]    [place]  Final    Comment: Performed at [facility], [place] [address], [place], [place] [address]      Radiology Studies: Dg Chest 2 View  Result Date: 09/30/2018 CLINICAL DATA:  Right pleural effusion. EXAM: CHEST - 2 VIEW COMPARISON:  09/27/2018 FINDINGS: The cardiac silhouette remains borderline enlarged. A small right pleural effusion has mildly enlarged with persistent adjacent airspace opacity and evidence of volume loss. There may be a trace left pleural effusion and minimal left basilar atelectasis no pneumothorax is identified. IMPRESSION: Mildly increased size of small right pleural effusion with right basilar atelectasis or infiltrate. Electronically Signed   By: Logan Bores M.D.   On: 09/30/2018 08:26        Scheduled Meds: . aspirin  324 mg Oral Once  . aspirin  81 mg  Oral Daily  . atorvastatin  40 mg Oral q1800  . carvedilol  6.25 mg Oral BID WC  . ferrous sulfate  325 mg Oral Q breakfast  . folic acid  1 mg Oral Daily  . heparin  5,000 Units Subcutaneous Q8H  . isosorbide-hydrALAZINE  1 tablet Oral TID  . levothyroxine  75 mcg Oral Q0600  . nicotine  21 mg Transdermal Daily  . pantoprazole  20 mg Oral Daily  . PARoxetine  60 mg Oral QHS  . traZODone  50-100 mg Oral QHS   Continuous Infusions:  Assessment & Plan:    1. Acute on chronic systolic CHF: 2D echo on 04/25/5100 showed EF of 45-50%.  Patient has a 1+ leg edema, elevated BNP 224, and repeat CXR showing bilateral pleural effusions and worsening right pleural effusion. On IV diuresis. O2 Prn. Saturating 94% currently on RA  2. Recurrent Pleural effusion: Required thoracentesis in recent admission and was discharged on Lasix 40 mg which helped initially but wasnt diuresing much after couple of days per patient report. CXR 6/13 shows slight interval increase in size  of the right-sided pleural effusion. Called radiology but IR not in-house today.  Will obtain lateral decubitus to assess further and follow-up in a.m with IR. if adequate fluid for thoracentesis given minimal response with IV Lasix and rising creatinine.Previously work up showed no evidence of infection or malignancy.    3.  Iron deficiency anemia:Hgb downtrending, 7.6 on 09/24/18-->8.3 on admission-->7.0 now. Repeat labs and transfuse 1 unit given symptoms of weakness. ?AOCD sec to CKD vs GI source. Patient denies any melena or hematochezia at home but reports dark tarry stools since started on iron while here.  Send stool for occult blood.. Will obtain fe studies.  She apparently had a colonoscopy 2 years back which was normal and EGD which showed esophageal stricture requiring dilation. Hold iron supplements.  Consult GI. Recent thoracentesis non bloody and no malignancy reported. She did have elevated RF and could be contributing.  Scheduled to see rheumatology as outpatient.    4. Generalized weakness /poor appetite: May be related to pleural effusion/ anemia and possible RA. Will consult GI for endoscopy and transfuse PRBC.  5. Acute on chronic Kidney Disease, stage III:  Baseline creatinine 1.6-1.8.  Her creatinine jumped from 1.49 to 2 on 6/13 and IV Lasix held.  Likely secondary to intravascular depletion.  Will repeat BMP today and diuresis as tolerated  6.  COPD: pt has SOB, but no wheezing or rhonchi, dose not seem to have COPD exacerbation.-bronchodilators . +smoker. On nicotine patch  7. Nausea/vomiting/abdominal pain: No complaints today. Tolerating diet.  Recent CT abd/pelvis in May unremarkable except for bilateral nephrolithiasis , no evidence of retroperitoneal hematoma. USG  RUQ 6/11 unremarkable. Patient reports h/o esophageal stricture which was dilated in the past and denies any dysphagia now.  Consult GI for # 3 and see if she will benefit from repeat endoscopy.   8 .Atypical chest pain: Resolved. Trop x2 -ve. Continue prn Nitroglycerin, Morphine, and aspirin, lipitor and coreg.  Repeat EKG with no significant change. Qtc 460 msecs   9. Depression/Anxiety-Continue home Klonopin and Paxil  10. Hypothyroidism:-Synthroid  11. HLD: Lipitor   DVT prophylaxis: Hold Heparin given worsening anemia and possible need for throacentesis in a.m. Code Status: Full code Family / Patient Communication:  d/w brother Eddie Dibbles [phone]) and updated Disposition Plan: Home when medically clear    LOS: 3 days    Time spent: 35 minutes    Guilford Shi, MD [facility] Pager [phone]  If 7PM-7AM, please contact night-coverage [email or web address] Password [email or web address] 10/01/2018, 11:12 AM

## 2018-10-01 NOTE — Progress Notes (Signed)
Spoke with patient's hospitalist Guilford Shi, MD, regarding concerns for ongoing anemia.  Reviewed office records.  Had a colonoscopy in 2016 for melena and diarrhea, noted to have internal hemorrhoids and hyperplastic polyps were removed, repeat colonoscopy was recommended in 2026.  Barium swallow from 2019 showed hiatal hernia and short distal esophageal stricture.  EGD was performed in 02/2018 with 18 mm balloon dilatation.  H. pylori testing negative on 08/2018.  CT abdomen with contrast unremarkable on 09/01/2018  USG from 09/28/2018 showed right pleural effusion, otherwise unremarkable.  FOBT negative on 09/20/2018.  No signs of GI blood loss.  IF call us back if there is obvious GI source of bleeding/anemia.  Ronnette Juniper, MD

## 2018-10-02 ENCOUNTER — Inpatient Hospital Stay: Payer: Medicare Other

## 2018-10-02 ENCOUNTER — Inpatient Hospital Stay: Payer: Medicare Other | Admitting: Hematology & Oncology

## 2018-10-02 DIAGNOSIS — R071 Chest pain on breathing: Secondary | ICD-10-CM

## 2018-10-02 LAB — CBC
HCT: 25.7 % — ABNORMAL LOW (ref 36.0–46.0)
Hemoglobin: 8.5 g/dL — ABNORMAL LOW (ref 12.0–15.0)
MCH: 32.1 pg (ref 26.0–34.0)
MCHC: 33.1 g/dL (ref 30.0–36.0)
MCV: 97 fL (ref 80.0–100.0)
Platelets: 189 10*3/uL (ref 150–400)
RBC: 2.65 MIL/uL — ABNORMAL LOW (ref 3.87–5.11)
RDW: 14.5 % (ref 11.5–15.5)
WBC: 2.5 10*3/uL — ABNORMAL LOW (ref 4.0–10.5)
nRBC: 0 % (ref 0.0–0.2)

## 2018-10-02 LAB — BASIC METABOLIC PANEL
Anion gap: 8 (ref 5–15)
BUN: 33 mg/dL — ABNORMAL HIGH (ref 8–23)
CO2: 20 mmol/L — ABNORMAL LOW (ref 22–32)
Calcium: 8.3 mg/dL — ABNORMAL LOW (ref 8.9–10.3)
Chloride: 112 mmol/L — ABNORMAL HIGH (ref 98–111)
Creatinine, Ser: 1.87 mg/dL — ABNORMAL HIGH (ref 0.44–1.00)
GFR calc Af Amer: 31 mL/min — ABNORMAL LOW (ref >60)
GFR calc non Af Amer: 27 mL/min — ABNORMAL LOW (ref >60)
Glucose, Bld: 84 mg/dL (ref 70–99)
Potassium: 4.1 mmol/L (ref 3.5–5.1)
Sodium: 140 mmol/L (ref 135–145)

## 2018-10-02 LAB — TYPE AND SCREEN
ABO/RH(D): O POS
Antibody Screen: NEGATIVE
Unit division: 0

## 2018-10-02 LAB — BPAM RBC
Blood Product Expiration Date: 202007132359
ISSUE DATE / TIME: 202006141659
Unit Type and Rh: 5100

## 2018-10-02 LAB — HEMOGLOBIN AND HEMATOCRIT, BLOOD
HCT: 26.2 % — ABNORMAL LOW (ref 36.0–46.0)
Hemoglobin: 8.7 g/dL — ABNORMAL LOW (ref 12.0–15.0)

## 2018-10-02 NOTE — Plan of Care (Signed)
  Problem: Clinical Measurements: Goal: Respiratory complications will improve Outcome: Progressing   

## 2018-10-02 NOTE — Progress Notes (Signed)
PROGRESS NOTE    Tiffany Leon  OMV:[id number]  DOB: 08-Jul-1950  DOA: 09/27/2018 PCP: Aretta Nip, MD  Brief Narrative:  68 y/o female with h/o COPD (not on home 02), hyperlipidemia, CHF with EF 45%, CKD 3, GERD, recent admission from 6/2-6/7 with pleural effusions requiring right sided thoracentesis with negative cultures but elevated RF at 600 (had rheumatologist appnt 6/11) presents with c/o left sided chest pain, abd pain, sob and 1+leg edema. CXR shows slight increase in right pleural effusion, BNP 224. She has new o2 requirement of 2lits and still feels dyspneic. Recent CT CAP -ve. RUQ unremarkable. EKG NSR.Trop x2 -ve so far.  Subjective: No acute issues or events overnight. Still SOB at rest, denies CP, headache, fevers, chills, nausea, vomiting, diarrhea, or constipation.  Objective: Vitals:   10/01/18 1750 10/01/18 2000 10/01/18 2056 10/02/18 0500  BP: (!) 155/76 129/66 128/64   Pulse: 83  78   Resp:   16   Temp:   98.1 F (36.7 C)   TempSrc:      SpO2: 97%  92%   Weight:    63.5 kg    Intake/Output Summary (Last 24 hours) at 10/02/2018 1748 Last data filed at 10/01/2018 2000 Gross per 24 hour  Intake 315 ml  Output -  Net 315 ml   Filed Weights   09/29/18 0553 10/01/18 0424 10/02/18 0500  Weight: 62.7 kg 63.3 kg 63.5 kg    Physical Examination:  General exam: Appears calm and comfortable  Respiratory system: bibasilar rales right >left; without overt wheeze or ronchi Cardiovascular system: S1 & S2 heard, RRR. No JVD, murmurs, rubs, gallops or clicks. Scant to 1+ pitting edema BLE Gastrointestinal system: Abdomen is nondistended, soft and nontender. No organomegaly or masses felt. Normal bowel sounds heard. Central nervous system: Alert and oriented. No focal neurological deficits. Extremities: Symmetric 5 x 5 power. Skin: No rashes, lesions or ulcers Psychiatry: Judgement and insight appear normal. Mood & affect appropriate.   Data Reviewed: I have  personally reviewed following labs and imaging studies  CBC: Recent Labs  Lab 09/27/18 2214 09/29/18 1326 09/30/18 0408 10/01/18 2326 10/02/18 0404  WBC 3.8* 3.3*  --   --  2.5*  HGB 8.3* 7.5* 7.0* 8.7* 8.5*  HCT 25.8* 23.0* 21.5* 26.2* 25.7*  MCV 99.6 101.8*  --   --  97.0  PLT 252 208  --   --  628   Basic Metabolic Panel: Recent Labs  Lab 09/27/18 2214 09/29/18 1326 09/30/18 0408 10/01/18 1144 10/02/18 0404  NA 141 140 142 141 140  K 3.9 4.1 3.9 4.2 4.1  CL 111 109 111 111 112*  CO2 20* 21* 22 23 20*  GLUCOSE 116* 105* 89 129* 84  BUN 32* 35* 34* 31* 33*  CREATININE 1.49* 2.01* 1.90* 1.62* 1.87*  CALCIUM 8.8* 8.3* 8.3* 8.5* 8.3*   GFR: Estimated Creatinine Clearance: 26.2 mL/min (A) (by C-G formula based on SCr of 1.87 mg/dL (H)). Liver Function Tests: Recent Labs  Lab 09/27/18 2355  AST 33  ALT 32  ALKPHOS 61  BILITOT 0.8  PROT 6.2*  ALBUMIN 2.8*   Recent Labs  Lab 09/27/18 2355  LIPASE 37   No results for input(s): AMMONIA in the last 168 hours. Coagulation Profile: No results for input(s): INR, PROTIME in the last 168 hours. Cardiac Enzymes: Recent Labs  Lab 09/27/18 2214 09/28/18 0328 09/28/18 0900 09/28/18 1437  TROPONINI <0.03 <0.03 <0.03 <0.03   BNP (last 3 results) No results  for input(s): PROBNP in the last 8760 hours. HbA1C: No results for input(s): HGBA1C in the last 72 hours. CBG: No results for input(s): GLUCAP in the last 168 hours. Lipid Profile: No results for input(s): CHOL, HDL, LDLCALC, TRIG, CHOLHDL, LDLDIRECT in the last 72 hours. Thyroid Function Tests: No results for input(s): TSH, T4TOTAL, FREET4, T3FREE, THYROIDAB in the last 72 hours. Anemia Panel: Recent Labs    10/01/18 1148 10/01/18 1200  VITAMINB12 864  --   FOLATE  --  15.8  FERRITIN 492*  --   TIBC 218*  --   IRON 89  --   RETICCTPCT 2.5  --    Sepsis Labs: No results for input(s): PROCALCITON, LATICACIDVEN in the last 168 hours.  Recent Results  (from the past 240 hour(s))  Novel Coronavirus, NAA (hospital order; send-out to ref lab)     Status: None   Collection Time: 09/28/18  1:51 AM  Result Value Ref Range Status   SARS-CoV-2, NAA NOT DETECTED NOT DETECTED Final    Comment: (NOTE) This test was developed and its performance characteristics determined by [employer]. This test has not been FDA cleared or approved. This test has been authorized by FDA under an Emergency Use Authorization (EUA). This test is only authorized for the duration of time the declaration that circumstances exist justifying the authorization of the emergency use of in vitro diagnostic tests for detection of SARS-CoV-2 virus and/or diagnosis of COVID-19 infection under section 564(b)(1) of the Act, 21 U.S.C. 824MPN-3(I)(1), unless the authorization is terminated or revoked sooner. When diagnostic testing is negative, the possibility of a false negative result should be considered in the context of a patient's recent exposures and the presence of clinical signs and symptoms consistent with COVID-19. An individual without symptoms of COVID-19 and who is not shedding SARS-CoV-2 virus would expect to have a negative (not detected) result in this assay. Performed  At: [facility] [address] [place], [place] [id number] Rush Farmer MD QP:[id number]    [place]  Final    Comment: Performed at [facility], [place] [address], [place], [place] [address]      Radiology Studies: Dg Chest Right Decubitus  Result Date: 10/01/2018 CLINICAL DATA:  Right pleural effusion. EXAM: CHEST - RIGHT DECUBITUS COMPARISON:  09/30/2018 FINDINGS: There is a moderate-sized right pleural effusion which layers dependently. Right basilar airspace opacity was better evaluated on yesterday's standard upright radiographs. The left lung remains grossly clear. IMPRESSION: Moderate-sized free-flowing right pleural effusion.  Electronically Signed   By: Logan Bores M.D.   On: 10/01/2018 12:29   Scheduled Meds: . aspirin  324 mg Oral Once  . aspirin  81 mg Oral Daily  . atorvastatin  40 mg Oral q1800  . carvedilol  6.25 mg Oral BID WC  . folic acid  1 mg Oral Daily  . isosorbide-hydrALAZINE  1 tablet Oral TID  . levothyroxine  75 mcg Oral Q0600  . nicotine  21 mg Transdermal Daily  . pantoprazole  20 mg Oral Daily  . PARoxetine  60 mg Oral QHS  . traZODone  50-100 mg Oral QHS   Continuous Infusions:  Assessment & Plan:   Hospital Problems: Principal Problem:   Chest pain Active Problems:   CKD (chronic kidney disease), stage III (HCC)   Nausea and vomiting   Hypothyroidism   Pleural effusion   Tobacco abuse   COPD (chronic obstructive pulmonary disease) (HCC)   Abdominal pain   HLD (hyperlipidemia)  Depression   Chronic systolic CHF (congestive heart failure) (HCC)   Iron deficiency   Acute on chronic systolic CHF exacerbation, poa, improving:  2D echo on 09/20/2018 showed EF of 45-50%.   Patient has a 1+ leg edema CXR showing bilateral pleural effusions Continue IV diuresis Not hypoxic but symptomatic even at rest - 96% currently on RA at rest  Atypical chest pain, ACS ruled out: Resolved.  Trop x2 -ve. Continue prn Nitroglycerin, Morphine, and aspirin, lipitor and coreg.  Repeat EKG unremarkable for ST elevation or depression  Recurrent Pleural effusion, likely in the stting of above HF exacerbation:  Previously required thoracentesis just a few weeks ago - consistent with HF CXR shows slight interval increase in size of the right-sided pleural effusion. IR previously consulted for repeat thoracentesis and need for decubitus film to rule out loculation.  Previously work up showed no evidence of infection or malignancy.    Chronic Kidney Disease, stage III, at baseline:   Baseline creatinine 1.6-1.8.  Creatinine appears to be near baseline currently 1.9 Follow as above - patient  appears euvolemic - encourage PO intake  COPD, not in acute exacerbation:  SOB in the setting of effusion as above Not consistent with COPD exacerbation - hold off on further treatment  Iron deficiency anemia; vs chronic anemia of chronic disease, stable:  Appears at baseline 8.3 on admission Continue iron supplement  Acute transient nausea/vomiting/abdominal pain, POA, resolved Asymptomatic, continue diet.  Recent CT abd/pelvis unremarkable. USG  RUQ 6/11 unremarkable  HLD: Lipitor  Depression/Anxiety-Continue home Klonopin and Paxil  Hypothyroidism:-Synthroid  DVT prophylaxis: Heparin Code Status: Full code Family / Patient Communication:  Daiva Eves 6/12 updated on patients request Disposition Plan: Home when medically clear; resolution of hypoxia, resolution of pleural effusion   LOS: 4 days   Time spent: 35 minutes  Little Ishikawa, MD [facility] Pager [phone]  If 7PM-7AM, please contact night-coverage [email or web address] Password [email or web address] 10/02/2018, 5:48 PM

## 2018-10-02 NOTE — Care Management Important Message (Signed)
Important Message  Patient Details  Name: Tiffany Leon MRN: [id number] Date of Birth: 1951-04-02   Medicare Important Message Given:  Yes    Shelda Altes 10/02/2018, 12:13 PM

## 2018-10-03 ENCOUNTER — Ambulatory Visit: Payer: Medicare Other | Admitting: Physical Therapy

## 2018-10-03 ENCOUNTER — Encounter (HOSPITAL_COMMUNITY): Payer: Self-pay | Admitting: General Practice

## 2018-10-03 LAB — BASIC METABOLIC PANEL
Anion gap: 9 (ref 5–15)
BUN: 38 mg/dL — ABNORMAL HIGH (ref 8–23)
CO2: 22 mmol/L (ref 22–32)
Calcium: 8.4 mg/dL — ABNORMAL LOW (ref 8.9–10.3)
Chloride: 107 mmol/L (ref 98–111)
Creatinine, Ser: 1.9 mg/dL — ABNORMAL HIGH (ref 0.44–1.00)
GFR calc Af Amer: 30 mL/min — ABNORMAL LOW (ref >60)
GFR calc non Af Amer: 26 mL/min — ABNORMAL LOW (ref >60)
Glucose, Bld: 101 mg/dL — ABNORMAL HIGH (ref 70–99)
Potassium: 4.1 mmol/L (ref 3.5–5.1)
Sodium: 138 mmol/L (ref 135–145)

## 2018-10-03 LAB — CBC
HCT: 27 % — ABNORMAL LOW (ref 36.0–46.0)
Hemoglobin: 8.8 g/dL — ABNORMAL LOW (ref 12.0–15.0)
MCH: 32 pg (ref 26.0–34.0)
MCHC: 32.6 g/dL (ref 30.0–36.0)
MCV: 98.2 fL (ref 80.0–100.0)
Platelets: 179 10*3/uL (ref 150–400)
RBC: 2.75 MIL/uL — ABNORMAL LOW (ref 3.87–5.11)
RDW: 14.2 % (ref 11.5–15.5)
WBC: 2.6 10*3/uL — ABNORMAL LOW (ref 4.0–10.5)
nRBC: 0 % (ref 0.0–0.2)

## 2018-10-03 MED ORDER — FUROSEMIDE 40 MG PO TABS
40.0000 mg | ORAL_TABLET | Freq: Every day | ORAL | Status: DC
  Administered 2018-10-03 – 2018-10-04 (×2): 40 mg via ORAL
  Filled 2018-10-03: qty 1

## 2018-10-03 NOTE — Progress Notes (Signed)
Physical Therapy Treatment Patient Details Name: Tiffany Leon MRN: [id number] DOB: 1951/02/21 Today's Date: 10/03/2018    History of Present Illness Tiffany Leon is a 68 y.o. female with recent admission for pleural effusion admitted with chest pain with increased effusion. PMHx: emphysema, GERD, depression, hypothyroidism, CKD, CHF, anemia and anxiety    PT Comments    Pt agreeable to ambulation this morning with focus on keeping oxygen saturation above 90%O2. Pt SaO2 on RA 92%O2 at rest with ambulation of 200 feet pt able to maintain SaO2 88-90%O2 on RA however with last 20 feet of ambulation back to room pt began to fatigued and increased pace. SaO2 dropped to 85%O2, pt instructed in pursed lipped breathing and was able to rebound SaO2 to 88%O2. Sitting back in bed pt SaO2 rebounded to 92%O2. Encouraged pt to continue work on pursed lipped breathing. PT will continue to follow acutely.     Follow Up Recommendations  Home health PT     Equipment Recommendations  None recommended by PT       Precautions / Restrictions Precautions Precautions: Fall Precaution Comments: watch sats Restrictions Weight Bearing Restrictions: No    Mobility  Bed Mobility Overal bed mobility: Independent             General bed mobility comments: incr time and effort  Transfers Overall transfer level: Independent                  Ambulation/Gait Ambulation/Gait assistance: Min guard Gait Distance (Feet): 200 Feet Assistive device: Rolling walker (2 wheeled) Gait Pattern/deviations: Step-through pattern;Decreased stride length     General Gait Details: min guard for safety, pt able to keep steady pace with SaO2 88-90% O2 on RA however as pt fatigued with ambulation back to room she increased pace and SaO2 dropped to 85%O2, educated on need to keep steady pace to decrease O2 demand       Balance Overall balance assessment: Needs assistance   Sitting balance-Leahy Scale: Good      Standing balance support: Single extremity supported Standing balance-Leahy Scale: Poor Standing balance comment: relies on UE support intermittently.                             Cognition Arousal/Alertness: Awake/alert Behavior During Therapy: Flat affect Overall Cognitive Status: Within Functional Limits for tasks assessed                                               Pertinent Vitals/Pain Pain Assessment: No/denies pain    Home Living Family/patient expects to be discharged to:: Private residence Living Arrangements: Spouse/significant other                      PT Goals (current goals can now be found in the care plan section) Acute Rehab PT Goals Patient Stated Goal: return home and be faster again PT Goal Formulation: With patient Time For Goal Achievement: 10/14/18 Potential to Achieve Goals: Good    Frequency    Min 3X/week      PT Plan Current plan remains appropriate       AM-PAC PT "6 Clicks" Mobility   Outcome Measure  Help needed turning from your back to your side while in a flat bed without using bedrails?: None Help needed moving from lying on  your back to sitting on the side of a flat bed without using bedrails?: None Help needed moving to and from a bed to a chair (including a wheelchair)?: None Help needed standing up from a chair using your arms (e.g., wheelchair or bedside chair)?: A Little Help needed to walk in hospital room?: A Little Help needed climbing 3-5 steps with a railing? : A Little 6 Click Score: 21    End of Session Equipment Utilized During Treatment: Gait belt Activity Tolerance: Patient tolerated treatment well Patient left: in chair;with call bell/phone within reach Nurse Communication: Mobility status PT Visit Diagnosis: Muscle weakness (generalized) (M62.81);Unsteadiness on feet (R26.81);Other abnormalities of gait and mobility (R26.89)     Time: 5625-6389 PT Time Calculation  (min) (ACUTE ONLY): 20 min  Charges:  $Gait Training: 8-22 mins                     Sumayah Bearse B. Migdalia Dk PT, DPT Acute Rehabilitation Services Pager [phone] Office [phone]    [place] 10/03/2018, 12:40 PM

## 2018-10-03 NOTE — Progress Notes (Signed)
PROGRESS NOTE    Tiffany Leon  PYK:[id number]  DOB: Sep 12, 1950  DOA: 09/27/2018 PCP: Aretta Nip, MD  Brief Narrative:  68 y/o female with h/o COPD (not on home 02), hyperlipidemia, CHF with EF 45%, CKD 3, GERD, recent admission from 6/2-6/7 with pleural effusions requiring right sided thoracentesis with negative cultures but elevated RF at 600 (had rheumatologist appnt 6/11) presents with c/o left sided chest pain, abd pain, sob and 1+leg edema. CXR shows slight increase in right pleural effusion, BNP 224. She has new o2 requirement of 2lits and still feels dyspneic. Recent CT CAP -ve. RUQ unremarkable. EKG NSR.Trop x2 -ve so far.  Subjective: No acute issues or events overnight. Still SOB at rest, denies CP, headache, fevers, chills, nausea, vomiting, diarrhea, or constipation.  Objective: Vitals:   10/02/18 1745 10/02/18 2320 10/03/18 0519 10/03/18 1154  BP: (!) 148/84 130/73 (!) 146/76 119/72  Pulse: 81 82 85 78  Resp:  18 20 18   Temp: 97.8 F (36.6 C) 98.2 F (36.8 C) 97.8 F (36.6 C) 97.6 F (36.4 C)  TempSrc: Oral Oral Oral Oral  SpO2: 92% 92% 91% 92%  Weight:   63.5 kg    No intake or output data in the 24 hours ending 10/03/18 1508 Filed Weights   10/01/18 0424 10/02/18 0500 10/03/18 0519  Weight: 63.3 kg 63.5 kg 63.5 kg    Physical Examination:  General exam: Appears calm and comfortable  Respiratory system: bibasilar rales right >left; without overt wheeze or ronchi Cardiovascular system: S1 & S2 heard, RRR. No JVD, murmurs, rubs, gallops or clicks. Scant to 1+ pitting edema BLE Gastrointestinal system: Abdomen is nondistended, soft and nontender. No organomegaly or masses felt. Normal bowel sounds heard. Central nervous system: Alert and oriented. No focal neurological deficits. Extremities: Symmetric 5 x 5 power. Skin: No rashes, lesions or ulcers Psychiatry: Judgement and insight appear normal. Mood & affect appropriate.   Data Reviewed: I have  personally reviewed following labs and imaging studies  CBC: Recent Labs  Lab 09/27/18 2214 09/29/18 1326 09/30/18 0408 10/01/18 2326 10/02/18 0404 10/03/18 0502  WBC 3.8* 3.3*  --   --  2.5* 2.6*  HGB 8.3* 7.5* 7.0* 8.7* 8.5* 8.8*  HCT 25.8* 23.0* 21.5* 26.2* 25.7* 27.0*  MCV 99.6 101.8*  --   --  97.0 98.2  PLT 252 208  --   --  189 539   Basic Metabolic Panel: Recent Labs  Lab 09/29/18 1326 09/30/18 0408 10/01/18 1144 10/02/18 0404 10/03/18 0502  NA 140 142 141 140 138  K 4.1 3.9 4.2 4.1 4.1  CL 109 111 111 112* 107  CO2 21* 22 23 20* 22  GLUCOSE 105* 89 129* 84 101*  BUN 35* 34* 31* 33* 38*  CREATININE 2.01* 1.90* 1.62* 1.87* 1.90*  CALCIUM 8.3* 8.3* 8.5* 8.3* 8.4*   GFR: Estimated Creatinine Clearance: 25.8 mL/min (A) (by C-G formula based on SCr of 1.9 mg/dL (H)). Liver Function Tests: Recent Labs  Lab 09/27/18 2355  AST 33  ALT 32  ALKPHOS 61  BILITOT 0.8  PROT 6.2*  ALBUMIN 2.8*   Recent Labs  Lab 09/27/18 2355  LIPASE 37   No results for input(s): AMMONIA in the last 168 hours. Coagulation Profile: No results for input(s): INR, PROTIME in the last 168 hours. Cardiac Enzymes: Recent Labs  Lab 09/27/18 2214 09/28/18 0328 09/28/18 0900 09/28/18 1437  TROPONINI <0.03 <0.03 <0.03 <0.03   BNP (last 3 results) No results for input(s): PROBNP  in the last 8760 hours. HbA1C: No results for input(s): HGBA1C in the last 72 hours. CBG: No results for input(s): GLUCAP in the last 168 hours. Lipid Profile: No results for input(s): CHOL, HDL, LDLCALC, TRIG, CHOLHDL, LDLDIRECT in the last 72 hours. Thyroid Function Tests: No results for input(s): TSH, T4TOTAL, FREET4, T3FREE, THYROIDAB in the last 72 hours. Anemia Panel: Recent Labs    10/01/18 1148 10/01/18 1200  VITAMINB12 864  --   FOLATE  --  15.8  FERRITIN 492*  --   TIBC 218*  --   IRON 89  --   RETICCTPCT 2.5  --    Sepsis Labs: No results for input(s): PROCALCITON, LATICACIDVEN in  the last 168 hours.  Recent Results (from the past 240 hour(s))  Novel Coronavirus, NAA (hospital order; send-out to ref lab)     Status: None   Collection Time: 09/28/18  1:51 AM  Result Value Ref Range Status   SARS-CoV-2, NAA NOT DETECTED NOT DETECTED Final    Comment: (NOTE) This test was developed and its performance characteristics determined by [employer]. This test has not been FDA cleared or approved. This test has been authorized by FDA under an Emergency Use Authorization (EUA). This test is only authorized for the duration of time the declaration that circumstances exist justifying the authorization of the emergency use of in vitro diagnostic tests for detection of SARS-CoV-2 virus and/or diagnosis of COVID-19 infection under section 564(b)(1) of the Act, 21 U.S.C. 637CHY-8(F)(0), unless the authorization is terminated or revoked sooner. When diagnostic testing is negative, the possibility of a false negative result should be considered in the context of a patient's recent exposures and the presence of clinical signs and symptoms consistent with COVID-19. An individual without symptoms of COVID-19 and who is not shedding SARS-CoV-2 virus would expect to have a negative (not detected) result in this assay. Performed  At: [facility] [address] [place], [place] [id number] Rush Farmer MD MV:[id number]    [place]  Final    Comment: Performed at [facility], [place] [address], [place], [place] [address]      Radiology Studies: No results found. Scheduled Meds: . aspirin  324 mg Oral Once  . aspirin  81 mg Oral Daily  . atorvastatin  40 mg Oral q1800  . carvedilol  6.25 mg Oral BID WC  . folic acid  1 mg Oral Daily  . isosorbide-hydrALAZINE  1 tablet Oral TID  . levothyroxine  75 mcg Oral Q0600  . nicotine  21 mg Transdermal Daily  . pantoprazole  20 mg Oral Daily  . PARoxetine  60 mg Oral QHS  . traZODone   50-100 mg Oral QHS   Continuous Infusions:  Assessment & Plan:   Hospital Problems: Principal Problem:   Chest pain Active Problems:   CKD (chronic kidney disease), stage III (HCC)   Nausea and vomiting   Hypothyroidism   Pleural effusion   Tobacco abuse   COPD (chronic obstructive pulmonary disease) (HCC)   Abdominal pain   HLD (hyperlipidemia)   Depression   Chronic systolic CHF (congestive heart failure) (HCC)   Iron deficiency   Acute on chronic systolic CHF exacerbation, poa, improving Concurrent acute hypoxia with exertion 2D echo on 09/20/2018 showed EF of 45-50%.   Patient has a 1+ leg edema CXR showing bilateral pleural effusions Continue diuresis with lasix 40 daily PO Patient now noted to be hypoxic with exertion - previously patient was noted to  be within normal limits at rest but now that patient is able to more appropriately work with PT she becomes hypoxic after moderate exertion.  Atypical chest pain, ACS ruled out: Resolved.  Trop x2 -ve. Continue prn Nitroglycerin, Morphine, and aspirin, lipitor and coreg.  Repeat EKG unremarkable for ST elevation or depression  Recurrent Pleural effusion, likely in the setting of above HF exacerbation Concurrent hypoxia ongoing as above:  Previously required thoracentesis just a few weeks ago - consistent with HF CXR shows slight interval increase in size of the right-sided pleural effusion. IR previously consulted for repeat thoracentesis and need for decubitus film to rule out loculation.  Previously work up showed no evidence of infection or malignancy.   Chronic Kidney Disease, stage III, at baseline:   Baseline creatinine 1.6-1.8.  Creatinine appears to be near baseline currently 1.9 Follow as above - patient appears euvolemic - encourage PO intake  COPD, not in acute exacerbation:  SOB in the setting of effusion as above Not consistent with COPD exacerbation - hold off on further treatment  Iron deficiency  anemia; vs chronic anemia of chronic disease, stable:  Appears at baseline 8.3 on admission Continue iron supplement  Acute transient nausea/vomiting/abdominal pain, POA, resolved Asymptomatic, continue diet.  Recent CT abd/pelvis unremarkable. USG  RUQ 6/11 unremarkable  HLD: Lipitor  Depression/Anxiety-Continue home Klonopin and Paxil  Hypothyroidism:-Synthroid  DVT prophylaxis: Heparin Code Status: Full code Family / Patient Communication:  Brother Eddie Dibbles unable by phone - message left to call back Disposition Plan: Home when medically clear; resolution of hypoxia, resolution of pleural effusion - likely an additional 24-48h   LOS: 5 days   Time spent: 35 minutes  Little Ishikawa, MD [facility] Pager [phone]  If 7PM-7AM, please contact night-coverage [email or web address] Password [facility] 10/03/2018, 3:08 PM

## 2018-10-03 NOTE — Plan of Care (Signed)

## 2018-10-04 LAB — CBC
HCT: 27.5 % — ABNORMAL LOW (ref 36.0–46.0)
Hemoglobin: 8.9 g/dL — ABNORMAL LOW (ref 12.0–15.0)
MCH: 31.3 pg (ref 26.0–34.0)
MCHC: 32.4 g/dL (ref 30.0–36.0)
MCV: 96.8 fL (ref 80.0–100.0)
Platelets: 175 10*3/uL (ref 150–400)
RBC: 2.84 MIL/uL — ABNORMAL LOW (ref 3.87–5.11)
RDW: 13.8 % (ref 11.5–15.5)
WBC: 3 10*3/uL — ABNORMAL LOW (ref 4.0–10.5)
nRBC: 0 % (ref 0.0–0.2)

## 2018-10-04 LAB — BASIC METABOLIC PANEL
Anion gap: 8 (ref 5–15)
BUN: 35 mg/dL — ABNORMAL HIGH (ref 8–23)
CO2: 23 mmol/L (ref 22–32)
Calcium: 8.7 mg/dL — ABNORMAL LOW (ref 8.9–10.3)
Chloride: 110 mmol/L (ref 98–111)
Creatinine, Ser: 1.75 mg/dL — ABNORMAL HIGH (ref 0.44–1.00)
GFR calc Af Amer: 34 mL/min — ABNORMAL LOW (ref >60)
GFR calc non Af Amer: 29 mL/min — ABNORMAL LOW (ref >60)
Glucose, Bld: 98 mg/dL (ref 70–99)
Potassium: 4.5 mmol/L (ref 3.5–5.1)
Sodium: 141 mmol/L (ref 135–145)

## 2018-10-04 NOTE — Discharge Summary (Signed)
Physician Discharge Summary  Tiffany Leon MBT:[id number] DOB: 23-Jul-1950 DOA: 09/27/2018  PCP: Aretta Nip, MD  Admit date: 09/27/2018 Discharge date: 10/04/2018  Admitted From: Home Disposition: Home  Recommendations for Outpatient Follow-up:  1. Follow up with PCP in 1-2 weeks 2. Please obtain BMP/CBC in one week  Home Health: Yes -PT Equipment/Devices: None  Discharge Condition: Stable CODE STATUS: Full Diet recommendation: Low-salt, fluid restricted 2 L, cardiac diet  Brief/Interim Summary: 68 y/o female with h/o COPD (not on home 02), hyperlipidemia, CHF with EF 45%, CKD 3, GERD, recent admission from 6/2-6/7 with pleural effusions requiring right sided thoracentesis with negative cultures but elevated RF at 600 (had rheumatologist appnt 6/11) presents with c/o left sided chest pain, abd pain, sob and 1+leg edema. CXR shows slight increase in right pleural effusion, BNP 224.She has new o2 requirement of 2lits and still feels dyspneic.Recent CT CAP -ve. RUQ unremarkable. EKG NSR.Trop x2 -ve so far.  Patient admitted as above with essentially acute exacerbation of heart failure with recent admission from 6/2-6/7 for similar episode and pleural effusion requiring thoracentesis.  Currently patient has been markedly noncompliant with dietary recommendations previously in the setting of newly diagnosed heart failure, continues on her high salt high fat non-fluid restricted diet.  See discussion today again with Eddie Dibbles about need for medication compliance otherwise patient will continue to rebound and be readmitted to the hospital with similar events.  This time patient is diuresed well, euvolemic, no longer hypoxic with ambulation and otherwise stable and agreeable for discharge home.  Will be discharged home with home health as above for ongoing physical therapy given her weakness due to volume overload and hypoxia.  Follow-up with PCP, cardiology, nephrology in the outpatient  setting as above.  Discharge Diagnoses:  Principal Problem:   Chest pain Active Problems:   CKD (chronic kidney disease), stage III (HCC)   Nausea and vomiting   Hypothyroidism   Pleural effusion   Tobacco abuse   COPD (chronic obstructive pulmonary disease) (HCC)   Abdominal pain   HLD (hyperlipidemia)   Depression   Chronic systolic CHF (congestive heart failure) (HCC)   Iron deficiency  Acute on chronic systolic CHF exacerbation, poa, resolving Concurrent acute hypoxia with exertion, resolved 2D echo on 09/20/2018 showed EF of 45-50%. CXR showing bilateral pleural effusions Continue diuresis with lasix 40 daily PO Hypoxic, ambulating without difficulty today, close follow-up with PCP, cardiology as above Discussed at length at bedside as well as over the phone with Eddie Dibbles (brother) about need for fluid restriction, salt restriction, daily weights   Atypical chest pain, ACS ruled out: Resolved.  Trop previously negative.  Continue prn Nitroglycerin, Morphine, and aspirin, lipitorand coreg.   Repeat EKG unremarkable for ST elevation or depression  Recurrent Pleural effusion, likely in the setting of above HF exacerbation Concurrent hypoxia ongoing as above: resolved Previously required thoracentesis just a few weeks ago - consistent with HF CXR shows slight interval increase in size of the right-sided pleural effusion. No need for thoracentesis, patient asked to defer as she was improving with diuresis  Chronic Kidney Disease, stage III, at baseline: Baseline creatinine 1.6-1.8 per previous records.  Scheduled to follow-up outpatient with nephrology  COPD, not in acute exacerbation: SOB in the setting of effusion as above  Iron deficiencyanemia; vs chronic anemia of chronic disease, stable:  Appears at baseline 8.3 on admission Continue iron supplement  Acute transient nausea/vomiting/abdominal pain, POA, resolved Asymptomatic, continue diet.  Recent CT  abd/pelvis unremarkable. USG  RUQ 6/11 unremarkable  HLD: Lipitor  Depression/Anxiety-Continue home Klonopin and Paxil  Hypothyroidism:-Synthroid   Discharge Instructions    Diet - low sodium heart healthy   Complete by: As directed    Increase activity slowly   Complete by: As directed      Allergies as of 10/04/2018      Reactions   Codeine Nausea And Vomiting      Medication List    TAKE these medications   aspirin 81 MG chewable tablet Chew 1 tablet (81 mg total) by mouth daily.   atorvastatin 40 MG tablet Commonly known as: LIPITOR Take 1 tablet (40 mg total) by mouth daily at 6 PM.   carvedilol 6.25 MG tablet Commonly known as: COREG Take 1 tablet (6.25 mg total) by mouth 2 (two) times daily with a meal.   clonazePAM 0.5 MG tablet Commonly known as: KLONOPIN Take 0.5 mg by mouth 2 (two) times daily as needed for anxiety.   clotrimazole 1 % cream Commonly known as: LOTRIMIN Apply topically 2 (two) times daily.   ferrous sulfate 325 (65 FE) MG tablet Take 1 tablet (325 mg total) by mouth daily with breakfast.   folic acid 1 MG tablet Commonly known as: FOLVITE Take 1 tablet (1 mg total) by mouth daily.   furosemide 40 MG tablet Commonly known as: LASIX Take 1 tablet (40 mg total) by mouth daily.   isosorbide-hydrALAZINE 20-37.5 MG tablet Commonly known as: BIDIL Take 1 tablet by mouth 3 (three) times daily.   levothyroxine 75 MCG tablet Commonly known as: SYNTHROID Take 75 mcg by mouth daily.   nicotine 21 mg/24hr patch Commonly known as: NICODERM CQ - dosed in mg/24 hours Place 1 patch (21 mg total) onto the skin daily.   PARoxetine 40 MG tablet Commonly known as: PAXIL TAKE 1.5 TABLETS BY MOUTH DAILY AT BEDTIME What changed: See the new instructions.   potassium chloride 10 MEQ tablet Commonly known as: K-DUR Take 1 tablet (10 mEq total) by mouth daily.   traZODone 50 MG tablet Commonly known as: DESYREL Take 50-100 mg by mouth at  bedtime.       Allergies  Allergen Reactions  . Codeine Nausea And Vomiting    Procedures/Studies: Dg Chest 1 View  Result Date: 09/23/2018 CLINICAL DATA:  Shortness of breath.  Smoker with CHF. EXAM: CHEST  1 VIEW COMPARISON:  09/20/2018 FINDINGS: Midline trachea. Mild cardiomegaly. Atherosclerosis in the transverse aorta. Small right pleural effusion. No pneumothorax. Patchy right base airspace disease, increased. Minimal left base subsegmental atelectasis is not significantly changed. IMPRESSION: Increase in small right pleural effusion with adjacent airspace disease, favored to represent atelectasis. Aortic Atherosclerosis (ICD10-I70.0). Electronically Signed   By: Abigail Miyamoto M.D.   On: 09/23/2018 13:32   Dg Chest 1 View  Result Date: 09/20/2018 CLINICAL DATA:  Initial evaluation for pleural effusion. Status post right thoracentesis. EXAM: CHEST  1 VIEW COMPARISON:  Prior CT from earlier the same day. FINDINGS: Cardiac and mediastinal silhouettes are stable in size and contour, and remain within normal limits. Aortic atherosclerosis. Lungs well inflated. No pneumothorax seen status post thoracentesis. Small residual right-sided pleural effusion, decreased from previous. Small amount of trapped fluid noted along the right minor fissure. Improved aeration at the right lung base. No new focal airspace disease. No pulmonary edema. No acute osseous finding. IMPRESSION: 1. Small residual right-sided pleural effusion status post thoracentesis, decreased from previous. No pneumothorax. 2. Improved aeration at the right lung base. 3. Aortic atherosclerosis. Electronically  Signed   By: Jeannine Boga M.D.   On: 09/20/2018 13:45   Dg Chest 2 View  Result Date: 09/30/2018 CLINICAL DATA:  Right pleural effusion. EXAM: CHEST - 2 VIEW COMPARISON:  09/27/2018 FINDINGS: The cardiac silhouette remains borderline enlarged. A small right pleural effusion has mildly enlarged with persistent adjacent  airspace opacity and evidence of volume loss. There may be a trace left pleural effusion and minimal left basilar atelectasis no pneumothorax is identified. IMPRESSION: Mildly increased size of small right pleural effusion with right basilar atelectasis or infiltrate. Electronically Signed   By: Logan Bores M.D.   On: 09/30/2018 08:26   Dg Chest 2 View  Result Date: 09/27/2018 CLINICAL DATA:  Increasing shortness of breath EXAM: CHEST - 2 VIEW COMPARISON:  Chest x-ray dated 09/23/2018 FINDINGS: Heart size is borderline enlarged. There is no pneumothorax. The lungs are hyperexpanded. There is an airspace opacity at the right lung base with a small parapneumonic effusion. There is no acute osseous abnormality. IMPRESSION: Slight interval increase in size of the right-sided pleural effusion. There is a persistent adjacent airspace opacity which has increased slightly from prior exam. This could represent atelectasis or infiltrate. Electronically Signed   By: Constance Holster M.D.   On: 09/27/2018 22:48   Dg Chest 2 View  Result Date: 09/19/2018 CLINICAL DATA:  Shortness of breath, productive cough. EXAM: CHEST - 2 VIEW COMPARISON:  Radiographs of August 08, 2014. FINDINGS: The heart size and mediastinal contours are within normal limits. Left lung is clear. Right lower lobe atelectasis or infiltrate is noted with mild right pleural effusion. The visualized skeletal structures are unremarkable. IMPRESSION: Right lower lobe opacity is noted concerning for atelectasis or pneumonia with mild right pleural effusion. Electronically Signed   By: Marijo Conception M.D.   On: 09/19/2018 19:39   Ct Head Wo Contrast  Result Date: 09/24/2018 CLINICAL DATA:  Confusion. EXAM: CT HEAD WITHOUT CONTRAST TECHNIQUE: Contiguous axial images were obtained from the base of the skull through the vertex without intravenous contrast. COMPARISON:  None. FINDINGS: Brain: No evidence for acute infarction, hemorrhage, mass lesion,  hydrocephalus, or extra-axial fluid. Generalized atrophy. Hypoattenuation of white matter, likely small vessel disease. Vascular: Calcification of the cavernous internal carotid arteries consistent with cerebrovascular atherosclerotic disease. No signs of intracranial large vessel occlusion. Skull: Calvarium intact. Sinuses/Orbits: Negative sinuses. No orbital findings of significance. LEFT cataract extraction. Chronic osteitis RIGHT maxillary sinus, without current significant opacity. Other: None. IMPRESSION: Atrophy and small vessel disease.  No acute intracranial findings. Electronically Signed   By: Staci Righter M.D.   On: 09/24/2018 10:56   Ct Chest Wo Contrast  Result Date: 09/20/2018 CLINICAL DATA:  Shortness of breath and cough for 2 weeks EXAM: CT CHEST WITHOUT CONTRAST TECHNIQUE: Multidetector CT imaging of the chest was performed following the standard protocol without IV contrast. COMPARISON:  Chest x-ray from 2 days ago.  Abdominal CT 09/01/2018 FINDINGS: Cardiovascular: Normal heart size. Small pericardial effusion. Aortic and coronary atherosclerosis. Mediastinum/Nodes: Negative for adenopathy or mass. Lungs/Pleura: Hyperinflation with mild emphysema. There is a moderate right and small left layering pleural effusion with atelectasis or scarring. Upper Abdomen: Bilateral nephrolithiasis with a 11 mm stone at the right UPJ. No urinary obstruction where visualized. Musculoskeletal: No acute or aggressive finding IMPRESSION: 1. Pleural effusion that is moderate on the right and small on the left, increased from 09/01/2018 abdominal CT. 2. Small pericardial effusion which is new from prior. 3. Atherosclerosis and bilateral nephrolithiasis. 4. Mild  emphysema. Electronically Signed   By: Monte Fantasia M.D.   On: 09/20/2018 04:19   Dg Chest Right Decubitus  Result Date: 10/01/2018 CLINICAL DATA:  Right pleural effusion. EXAM: CHEST - RIGHT DECUBITUS COMPARISON:  09/30/2018 FINDINGS: There is a  moderate-sized right pleural effusion which layers dependently. Right basilar airspace opacity was better evaluated on yesterday's standard upright radiographs. The left lung remains grossly clear. IMPRESSION: Moderate-sized free-flowing right pleural effusion. Electronically Signed   By: Logan Bores M.D.   On: 10/01/2018 12:29   US Abdomen Limited Ruq  Result Date: 09/28/2018 CLINICAL DATA:  Initial evaluation for acute right upper quadrant pain. EXAM: ULTRASOUND ABDOMEN LIMITED RIGHT UPPER QUADRANT COMPARISON:  Prior CT from 09/01/2018 FINDINGS: Gallbladder: No gallstones or wall thickening visualized. No sonographic Murphy sign noted by sonographer. Common bile duct: Diameter: 2.1 mm Liver: No focal lesion identified. Within normal limits in parenchymal echogenicity. Portal vein is patent on color Doppler imaging with normal direction of blood flow towards the liver. Incidental note made of a right pleural effusion, partially visualized. IMPRESSION: 1. Normal right upper quadrant ultrasound. 2. Incidental right pleural effusion. Electronically Signed   By: Jeannine Boga M.D.   On: 09/28/2018 00:43   Ir Thoracentesis Asp Pleural Space W/img Guide  Result Date: 09/20/2018 INDICATION: Patient with history of dyspnea, pericardial effusion, and right pleural effusion. Request is made for diagnostic and therapeutic right thoracentesis. EXAM: ULTRASOUND GUIDED DIAGNOSTIC AND THERAPEUTIC RIGHT THORACENTESIS MEDICATIONS: 10 mL 1% lidocaine COMPLICATIONS: None immediate. PROCEDURE: An ultrasound guided thoracentesis was thoroughly discussed with the patient and questions answered. The benefits, risks, alternatives and complications were also discussed. The patient understands and wishes to proceed with the procedure. Written consent was obtained. Ultrasound was performed to localize and mark an adequate pocket of fluid in the right chest. The area was then prepped and draped in the normal sterile fashion. 1%  Lidocaine was used for local anesthesia. Under ultrasound guidance a 6 Fr Safe-T-Centesis catheter was introduced. Thoracentesis was performed. The catheter was removed and a dressing applied. FINDINGS: A total of approximately 900 mL of clear yellow fluid was removed. Samples were sent to the laboratory as requested by the clinical team. IMPRESSION: Successful ultrasound guided right thoracentesis yielding 900 mL of pleural fluid. Read by: Earley Abide, PA-C Electronically Signed   By: Jerilynn Mages.  Shick M.D.   On: 09/20/2018 13:50     Discharge Exam: Vitals:   10/04/18 0833 10/04/18 1118  BP: (!) 178/95 (!) 164/87  Pulse:    Resp:    Temp:    SpO2:  96%   Vitals:   10/04/18 0624 10/04/18 0831 10/04/18 0833 10/04/18 1118  BP: (!) 146/86 (!) 180/92 (!) 178/95 (!) 164/87  Pulse: 86     Resp: 20     Temp: 97.8 F (36.6 C)     TempSrc: Oral     SpO2: 92%   96%  Weight: 63.3 kg     Height:        General:  Pleasantly resting in bed, No acute distress. HEENT:  Normocephalic atraumatic.  Sclerae nonicteric, noninjected.  Extraocular movements intact bilaterally. Neck:  Without mass or deformity.  Trachea is midline. Lungs:  Clear to auscultate bilaterally without rhonchi, wheeze, or rales. Heart:  Regular rate and rhythm.  Without murmurs, rubs, or gallops. Abdomen:  Soft, nontender, nondistended.  Without guarding or rebound. Extremities: Without cyanosis, clubbing, edema, or obvious deformity. Vascular:  Dorsalis pedis and posterior tibial pulses palpable bilaterally. Skin:  Warm and dry, no erythema, no ulcerations.   The results of significant diagnostics from this hospitalization (including imaging, microbiology, ancillary and laboratory) are listed below for reference.     Microbiology: Recent Results (from the past 240 hour(s))  Novel Coronavirus, NAA (hospital order; send-out to ref lab)     Status: None   Collection Time: 09/28/18  1:51 AM  Result Value Ref Range Status    SARS-CoV-2, NAA NOT DETECTED NOT DETECTED Final    Comment: (NOTE) This test was developed and its performance characteristics determined by [employer]. This test has not been FDA cleared or approved. This test has been authorized by FDA under an Emergency Use Authorization (EUA). This test is only authorized for the duration of time the declaration that circumstances exist justifying the authorization of the emergency use of in vitro diagnostic tests for detection of SARS-CoV-2 virus and/or diagnosis of COVID-19 infection under section 564(b)(1) of the Act, 21 U.S.C. 106YIR-4(W)(5), unless the authorization is terminated or revoked sooner. When diagnostic testing is negative, the possibility of a false negative result should be considered in the context of a patient's recent exposures and the presence of clinical signs and symptoms consistent with COVID-19. An individual without symptoms of COVID-19 and who is not shedding SARS-CoV-2 virus would expect to have a negative (not detected) result in this assay. Performed  At: [facility] [address] [place], [place] [id number] Rush Farmer MD XF:[id number]    [place]  Final    Comment: Performed at [facility], [place] [address] [place], [place] [address]     Labs: BNP (last 3 results) Recent Labs    09/20/18 1200 09/28/18 0328  BNP 608.1* 381.0*   Basic Metabolic Panel: Recent Labs  Lab 09/30/18 0408 10/01/18 1144 10/02/18 0404 10/03/18 0502 10/04/18 0410  NA 142 141 140 138 141  K 3.9 4.2 4.1 4.1 4.5  CL 111 111 112* 107 110  CO2 22 23 20* 22 23  GLUCOSE 89 129* 84 101* 98  BUN 34* 31* 33* 38* 35*  CREATININE 1.90* 1.62* 1.87* 1.90* 1.75*  CALCIUM 8.3* 8.5* 8.3* 8.4* 8.7*   Liver Function Tests: Recent Labs  Lab 09/27/18 2355  AST 33  ALT 32  ALKPHOS 61  BILITOT 0.8  PROT 6.2*  ALBUMIN 2.8*   Recent Labs  Lab 09/27/18 2355  LIPASE 37   No results  for input(s): AMMONIA in the last 168 hours. CBC: Recent Labs  Lab 09/27/18 2214 09/29/18 1326 09/30/18 0408 10/01/18 2326 10/02/18 0404 10/03/18 0502 10/04/18 0410  WBC 3.8* 3.3*  --   --  2.5* 2.6* 3.0*  HGB 8.3* 7.5* 7.0* 8.7* 8.5* 8.8* 8.9*  HCT 25.8* 23.0* 21.5* 26.2* 25.7* 27.0* 27.5*  MCV 99.6 101.8*  --   --  97.0 98.2 96.8  PLT 252 208  --   --  189 179 175   Cardiac Enzymes: Recent Labs  Lab 09/27/18 2214 09/28/18 0328 09/28/18 0900 09/28/18 1437  TROPONINI <0.03 <0.03 <0.03 <0.03   BNP: Invalid input(s): POCBNP CBG: No results for input(s): GLUCAP in the last 168 hours. D-Dimer No results for input(s): DDIMER in the last 72 hours. Hgb A1c No results for input(s): HGBA1C in the last 72 hours. Lipid Profile No results for input(s): CHOL, HDL, LDLCALC, TRIG, CHOLHDL, LDLDIRECT in the last 72 hours. Thyroid function studies No results for input(s): TSH, T4TOTAL, T3FREE, THYROIDAB in the last 72 hours.  Invalid input(s): FREET3 Anemia work  up No results for input(s): VITAMINB12, FOLATE, FERRITIN, TIBC, IRON, RETICCTPCT in the last 72 hours. Urinalysis    Component Value Date/Time   COLORURINE YELLOW 09/20/2018 2100   APPEARANCEUR HAZY (A) 09/20/2018 2100   LABSPEC 1.018 09/20/2018 2100   PHURINE 5.0 09/20/2018 2100   GLUCOSEU NEGATIVE 09/20/2018 2100   HGBUR LARGE (A) 09/20/2018 2100   BILIRUBINUR NEGATIVE 09/20/2018 2100   KETONESUR 5 (A) 09/20/2018 2100   PROTEINUR >=300 (A) 09/20/2018 2100   NITRITE NEGATIVE 09/20/2018 2100   LEUKOCYTESUR NEGATIVE 09/20/2018 2100   Sepsis Labs Invalid input(s): PROCALCITONIN,  WBC,  LACTICIDVEN Microbiology Recent Results (from the past 240 hour(s))  Novel Coronavirus, NAA (hospital order; send-out to ref lab)     Status: None   Collection Time: 09/28/18  1:51 AM  Result Value Ref Range Status   SARS-CoV-2, NAA NOT DETECTED NOT DETECTED Final    Comment: (NOTE) This test was developed and its performance  characteristics determined by [employer]. This test has not been FDA cleared or approved. This test has been authorized by FDA under an Emergency Use Authorization (EUA). This test is only authorized for the duration of time the declaration that circumstances exist justifying the authorization of the emergency use of in vitro diagnostic tests for detection of SARS-CoV-2 virus and/or diagnosis of COVID-19 infection under section 564(b)(1) of the Act, 21 U.S.C. 829FAO-1(H)(0), unless the authorization is terminated or revoked sooner. When diagnostic testing is negative, the possibility of a false negative result should be considered in the context of a patient's recent exposures and the presence of clinical signs and symptoms consistent with COVID-19. An individual without symptoms of COVID-19 and who is not shedding SARS-CoV-2 virus would expect to have a negative (not detected) result in this assay. Performed  At: [facility] [address] [place], [place] [id number] Rush Farmer MD EX:[id number]    [place]  Final    Comment: Performed at [facility], [place] [address] [place], [place] [address]    Time coordinating discharge: Over 30 minutes  SIGNED:  Little Ishikawa, DO [facility] 10/04/2018, 1:49 PM Pager   If 7PM-7AM, please contact night-coverage [email or web address] Password [email or web address]

## 2018-10-04 NOTE — Care Management (Signed)
1533 10-04-18 Patient was d/c home before I was able to see her. Patient was previously set up with outpatient PT and husband wanted to continue with this services. Plan will be to go to Rehab on [facility] 10-05-18. No further needs from CM at this time. Bethena Roys, RN,BSN Case Manager [phone]

## 2018-10-04 NOTE — Progress Notes (Signed)
Pt discharged to home, reviewed discharge instructions, med list, follow up appointments and heart failure instructions with pt. All questions answered, pt understands without assistance. IV, tele box and oxygen probe removed without difficulty. Pt gathered all belongings and is awaiting spouse to pick her up.

## 2018-10-04 NOTE — Progress Notes (Signed)
Spoke to patient's husband michael Czerniak and updated with POC [phone]

## 2018-10-04 NOTE — Progress Notes (Signed)
SATURATION QUALIFICATIONS: (This note is used to comply with regulatory documentation for home oxygen)  Patient Saturations on Room Air at Rest = 96%  Patient Saturations on Room Air while Ambulating = 92%  Patient Saturations on 0 Liters of oxygen while Ambulating = 92%  Please briefly explain why patient needs home oxygen: Pt does NOT NEED home O2

## 2018-10-05 ENCOUNTER — Telehealth: Payer: Self-pay | Admitting: Psychiatry

## 2018-10-05 ENCOUNTER — Ambulatory Visit: Payer: Medicare Other | Attending: Internal Medicine | Admitting: Physical Therapy

## 2018-10-05 ENCOUNTER — Other Ambulatory Visit: Payer: Self-pay | Admitting: Psychiatry

## 2018-10-05 ENCOUNTER — Other Ambulatory Visit: Payer: Self-pay

## 2018-10-05 ENCOUNTER — Encounter: Payer: Self-pay | Admitting: Orthopaedic Surgery

## 2018-10-05 ENCOUNTER — Encounter: Payer: Self-pay | Admitting: Physical Therapy

## 2018-10-05 ENCOUNTER — Ambulatory Visit: Payer: Self-pay

## 2018-10-05 ENCOUNTER — Ambulatory Visit (INDEPENDENT_AMBULATORY_CARE_PROVIDER_SITE_OTHER): Payer: Medicare Other | Admitting: Orthopaedic Surgery

## 2018-10-05 DIAGNOSIS — R262 Difficulty in walking, not elsewhere classified: Secondary | ICD-10-CM

## 2018-10-05 DIAGNOSIS — M5442 Lumbago with sciatica, left side: Secondary | ICD-10-CM | POA: Diagnosis not present

## 2018-10-05 DIAGNOSIS — M6281 Muscle weakness (generalized): Secondary | ICD-10-CM | POA: Diagnosis not present

## 2018-10-05 DIAGNOSIS — R29898 Other symptoms and signs involving the musculoskeletal system: Secondary | ICD-10-CM | POA: Diagnosis present

## 2018-10-05 DIAGNOSIS — M7062 Trochanteric bursitis, left hip: Secondary | ICD-10-CM

## 2018-10-05 DIAGNOSIS — G8929 Other chronic pain: Secondary | ICD-10-CM | POA: Diagnosis not present

## 2018-10-05 MED ORDER — BUPIVACAINE HCL 0.25 % IJ SOLN
2.0000 mL | INTRAMUSCULAR | Status: AC | PRN
  Administered 2018-10-05: 2 mL via INTRA_ARTICULAR

## 2018-10-05 MED ORDER — METHYLPREDNISOLONE ACETATE 40 MG/ML IJ SUSP
40.0000 mg | INTRAMUSCULAR | Status: AC | PRN
  Administered 2018-10-05: 40 mg via INTRA_ARTICULAR

## 2018-10-05 MED ORDER — LIDOCAINE HCL 1 % IJ SOLN
3.0000 mL | INTRAMUSCULAR | Status: AC | PRN
  Administered 2018-10-05: 3 mL

## 2018-10-05 NOTE — Telephone Encounter (Signed)
Patient's husband stopped in office and stated patient only has 2 pills left of the Clonazepam and need a refill today to be sent to [facility].

## 2018-10-05 NOTE — Telephone Encounter (Signed)
Refill has been sent to [facility] per request

## 2018-10-05 NOTE — Progress Notes (Signed)
Office Visit Note   Patient: Tiffany Leon           Date of Birth: 1951/03/20           MRN: [id number] Visit Date: 10/05/2018              Requested by: Aretta Nip, [place],  [place] [address] PCP: Aretta Nip, MD   Assessment & Plan: Visit Diagnoses:  1. Trochanteric bursitis, left hip   2. Chronic left-sided low back pain with left-sided sciatica     Plan: Impression is left hip trochanteric bursitis versus left sided lumbar radiculopathy.  She is unable to take anti-inflammatories so we will proceed with a diagnostic and hopefully therapeutic cortisone injection to the left trochanteric bursa today.  I have also provided her with an iliotibial band stretching program.  I am unsure whether she will be able to do this due to her significant weakness following her recent hospital stay.  She will call us in the next 2 to 4 weeks if she has no improvement following the injection today.  Follow-Up Instructions: Return if symptoms worsen or fail to improve.   Orders:  Orders Placed This Encounter  Procedures   Large Joint Inj: L greater trochanter   XR Lumbar Spine 2-3 Views   No orders of the defined types were placed in this encounter.     Procedures: Large Joint Inj: L greater trochanter on 10/05/2018 9:31 AM Indications: pain Details: 22 G needle, lateral approach Medications: 2 mL bupivacaine 0.25 %; 3 mL lidocaine 1 %; 40 mg methylPREDNISolone acetate 40 MG/ML      Clinical Data: No additional findings.   Subjective: Chief Complaint  Patient presents with   Lower Back - Pain   Left Hip - Pain    HPI patient is a pleasant 68 year old female who presents our clinic today with left lateral hip pain and occasional pain to the midline of her lower back.  This is been ongoing for the past 3 years without any known injury or change in activity.  It does not worsened, but it has not improved.  Pain is worse when she is standing in  1 place for too long or with any activity.  She was previously taking ibuprofen with mild relief of symptoms.  She denies any weakness or numbness, tingling or burning to either lower extremity.  She was recently hospitalized for 2 weeks for congestive heart failure and was discharged yesterday.  She has been told not to take any anti-inflammatories.  Review of Systems as detailed in HPI.  All others reviewed and are negative.   Objective: Vital Signs: There were no vitals taken for this visit.  Physical Exam well-developed well-nourished female in no acute distress.  Alert and oriented x3.  Ortho Exam examination of her left lower extremity reveals a negative logroll.  Positive straight leg raise.  Marked tenderness over the greater trochanter.  Mild tenderness to the midline of her lower back.  No focal weakness.  She is neurovascularly intact distally.  Specialty Comments:  No specialty comments available.  Imaging: Xr Lumbar Spine 2-3 Views  Result Date: 10/05/2018 X-rays of the lumbar spine reveal marked diffuse degenerative disc disease worse at L3-4 L4-5 and L5-S1.    PMFS History: Patient Active Problem List   Diagnosis Date Noted   Chest pain 09/28/2018   Abdominal pain 09/28/2018   HLD (hyperlipidemia) 09/28/2018   Depression 54/27/0623   Chronic systolic CHF (  congestive heart failure) ([place]) 09/28/2018   Iron deficiency 09/28/2018   SOB (shortness of breath) 09/20/2018   Hypertensive urgency 09/20/2018   CKD (chronic kidney disease), stage III ([place]) 09/20/2018   Nausea and vomiting 09/20/2018   Hypothyroidism 09/20/2018   Pleural effusion 09/20/2018   Tobacco abuse 09/20/2018   Tinea pedis 09/20/2018   COPD (chronic obstructive pulmonary disease) ([place]) 09/20/2018   Nephrolithiasis 09/20/2018   Panic disorder with agoraphobia 05/09/2018   Past Medical History:  Diagnosis Date   Acute kidney injury ([place])    a. 09/2018   Anxiety    Aortic  atherosclerosis ([place])    a. 09/2018 noted on Chest CT.   Chronic combined systolic (congestive) and diastolic (congestive) heart failure ([place])    a. 09/2018 Echo: EF 45-50%, diff HK. Triv to small circumfirential pericardial eff.   Chronic DOE (dyspnea on exertion)    Claudication ([place])    a. Bilat hip claudication since ~ 2019.   Depression    Dyspnea    Emphysema lung ([place])    Exertional angina ([place])    a. Ex angina since ~ 2019.   GERD (gastroesophageal reflux disease)    Hiatal hernia    History of bronchitis    History of kidney stones    Hypothyroidism    Pleural effusion    a. 09/2018 s/p thoracentesis-->973ml.   Pleural effusion 09/2018   Resting tremor    Tobacco abuse     Family History  Problem Relation Age of Onset   Stroke Mother        Mini-strokes. Died @ 22.   Dementia Mother    Lung cancer Father        Died in his 74's   Addison's disease Sister    Hypertension Brother    CAD Brother    Hypertension Brother     Past Surgical History:  Procedure Laterality Date   BREAST LUMPECTOMY WITH RADIOACTIVE SEED LOCALIZATION Left 12/28/2017   Procedure: BREAST LUMPECTOMY WITH RADIOACTIVE SEED LOCALIZATION;  Surgeon: Autumn Messing III, MD;  Location: [facility];  Service: General;  Laterality: Left;   DG THUMB RIGHT HAND ([facility] HX)     IR THORACENTESIS ASP PLEURAL SPACE W/IMG GUIDE  09/20/2018   KIDNEY STONE SURGERY     Social History   Occupational History   Not on file  Tobacco Use   Smoking status: Current Every Day Smoker    Packs/day: 0.75    Years: 40.00    Pack years: 30.00    Types: Cigarettes   Smokeless tobacco: Never Used   Tobacco comment: smoking 15 cigarettes/day  Substance and Sexual Activity   Alcohol use: Yes    Comment: 1-2 gl wine / night - none since 07/2018   Drug use: Not Currently    Comment: prev used drugs ~ 35 yrs ago.   Sexual activity: Not on file

## 2018-10-05 NOTE — Patient Instructions (Signed)
Access Code: [email or web address]  URL: [email or web address]  Date: 10/05/2018  Prepared by: Jeral Pinch   Exercises  Proper Sit to Stand Technique - 5-10 reps - 2x daily  Standing Heel Raises - 5-10 reps - 2x daily  Standing Hip Abduction with Counter Support - 5-10 reps  Standing Hip Extension with Counter Support - 5-10 reps - 2x daily

## 2018-10-05 NOTE — Therapy (Addendum)
[place], [place], [address] Phone: [phone]   Fax:  [phone]  Physical Therapy Evaluation/Discharge  Patient Details  Name: Parnika Tweten MRN: [id number] Date of Birth: Jan 19, 1951 Referring Provider (PT): Dr Landis Gandy   Encounter Date: 10/05/2018  PT End of Session - 10/05/18 1049    Visit Number  1    Number of Visits  16    Date for PT Re-Evaluation  11/30/18    Authorization Type  MCR p note after 10th visit    PT Start Time  1049    PT Stop Time  1120    PT Time Calculation (min)  31 min    Activity Tolerance  Patient limited by fatigue       Past Medical History:  Diagnosis Date  . Acute kidney injury ([place])    a. 09/2018  . Anxiety   . Aortic atherosclerosis ([place])    a. 09/2018 noted on Chest CT.  Marland Kitchen Chronic combined systolic (congestive) and diastolic (congestive) heart failure ([place])    a. 09/2018 Echo: EF 45-50%, diff HK. Triv to small circumfirential pericardial eff.  . Chronic DOE (dyspnea on exertion)   . Claudication [facility])    a. Bilat hip claudication since ~ 2019.  Mar[place] Kitchen Depression   . Dyspnea   . Emphysema lung ([place])   . Exertional angina ([facility])    a. Ex angina since ~ 2019.  Marland Kitchen GERD (gastroesophageal reflux disease)   . Hiatal hernia   . History of bronchitis   . History of kidney stones   . Hypothyroidism   . Pleural effusion    a. 09/2018 s/p thoracentesis-->930m.  . Pleural effusion 09/2018  . Resting tremor   . Tobacco abuse     Past Surgical History:  Procedure Laterality Date  . BREAST LUMPECTOMY WITH RADIOACTIVE SEED LOCALIZATION Left 12/28/2017   Procedure: BREAST LUMPECTOMY WITH RADIOACTIVE SEED LOCALIZATION;  Surgeon: TJovita Kussmaul MD;  [facility] [facility]  Service: General;  Laterality: Left;  . DG THUMB RIGHT HAND ([facility])    . IR THORACENTESIS ASP PLEURAL SPACE W/IMG GUIDE  09/20/2018  . KIDNEY STONE SURGERY      There were no vitals filed for this  visit.   Subjective Assessment - 10/05/18 1049    Subjective  Pt is referred to PT due to dx of weakness,  She was discharged from the hospital yesterday after 2 wks with CHF.  She was seen by ortho MD today for back and Lt hip pain and had an injection in the hip in the office.    How long can you walk comfortably?  BR to LR 70' and is exhuasted.    Patient Stated Goals  care for her grand daughter again, go to zoo and beach and perform all ADLs I    Currently in Pain?  Other (Comment)   no pain at this time, did have pain this morning        [facility] - 10/05/18 0001      Assessment   Medical Diagnosis  Weakness    Referring Provider (PT)  Dr ELandis Gandy   Onset Date/Surgical Date  09/21/18    Next MD Visit  6/302020    Prior Therapy  had some PT in the hospital.        Precautions   Precautions  None      Balance Screen   Has the patient fallen in the past 6  months  No    Has the patient had a decrease in activity level because of a fear of falling?   No    Is the patient reluctant to leave their home because of a fear of falling?   No      Home Environment   Living Environment  Private residence    Living Arrangements  Spouse/significant other    Home Layout  One level    [place] - 2 wheels      Prior Function   Level of Independence  Needs assistance with ADLs;Requires assistive device for independence   was I with all activity prior to hospital    Vocation  Retired    Leisure  keeping her 32 yo grand daughter      Cognition   Overall Cognitive Status  Within Functional Limits for tasks assessed      Observation/Other Assessments   Other Surveys   --   pulse 71, O2 sats 97%      Observation/Other Assessments-Edema    Edema  --   pitting edema bilat LEs     Posture/Postural Control   Posture Comments  FWD flexed posturing in standing      ROM / Strength   AROM / PROM / Strength  AROM;Strength      AROM   Overall AROM  Comments  U/LEs WFL except dorsiflexion limited bilat.       Strength   Overall Strength Comments  UEs grossly 4/5 with some elbow pain.    Strength Assessment Site  Hip;Knee;Ankle    Right/Left Hip  --   Lt grossly 4-/5, Rt 3+/5   Right/Left Knee  --   grossly 4+/5   Right/Left Ankle  --   4/5     Flexibility   Soft Tissue Assessment /Muscle Length  --   NA at this time      Palpation   Palpation comment  NA at this time.       Transfers   Five time sit to stand comments   --   35mn 48 sec, HR 95, o2 sats 96%      Ambulation/Gait   Ambulation/Gait  --   pt to fatigued to walk today.                Objective measurements completed on examination: See above findings.      [facility] PT Treatment/Exercise - 10/05/18 0001      Self-Care   Self-Care  Other Self-Care Comments    Other Self-Care Comments   verbally reviewed HEP with husband and pt, unable to perform d/t fatigue              PT Education - 10/05/18 1141    Education Details  HEP and POC    Person(s) Educated  Patient;Spouse    Methods  Explanation;Demonstration;Handout    Comprehension  Need further instruction;Other (comment)   only verbal review      PT Short Term Goals - 10/05/18 1147      PT SHORT TERM GOAL #1   Title  I with initial HEP ( 11/02/2018)    Time  4    Period  Weeks    Status  New    Target Date  11/02/18      PT SHORT TERM GOAL #2   Title  ambulate with straight cane and complete 6' walk test for basline ( 11/02/2018)    Time  4  Period  Weeks    Status  New    Target Date  11/02/18      PT SHORT TERM GOAL #3   Title  tolerate 45' treatment session ( 11/02/2018)    Time  4    Period  Weeks    Status  New    Target Date  11/02/18      PT SHORT TERM GOAL #4   Title  improve bilat dorsiflexion to [facility] for ambulation ( 11/02/2018)    Time  4    Period  Weeks    Status  New    Target Date  11/02/18      PT SHORT TERM GOAL #5   Title  perform 5 reps  sit to stand withing 45 sec ( 11/02/2018)    Time  4    Period  Weeks    Status  New    Target Date  11/02/18        PT Long Term Goals - 10/05/18 1148      PT LONG TERM GOAL #1   Title  I with advanced HEP and report no fear of falling ( 11/30/2018)    Time  8    Period  Weeks    Status  New    Target Date  11/30/18      PT LONG TERM GOAL #2   Title  complete 5 reps sit to stand < 10 sec ( 11/30/2018)    Time  8    Period  Weeks    Status  New    Target Date  11/30/18      PT LONG TERM GOAL #3   Title  ambulate in the community without assistive device per her previous level ( 11/30/2018)    Time  8    Period  Weeks    Status  New    Target Date  11/30/18      PT LONG TERM GOAL #4   Title  I with all ADLS/IADLs ( 5/00/9381)    Time  8    Period  Weeks    Status  New    Target Date  11/30/18             Plan - 10/05/18 1142    Clinical Impression Statement  68 yo female discharged from the hospital yesterday after being admitted for 2 wks with CHF.  She had another appointment this morning and presents to the eval very fatigued.  She has overall deconditioning and weakness. Limited in mobility and requiring assistance with ADLS and walking at this time.  Her oxygen sats stayed high during todays assessment.  She was very active prior to hospital and wishes to return to I ambulation and taking care of her 59 yo granddaughter.    Personal Factors and Comorbidities  Age;Comorbidity 3+    Examination-Activity Limitations  Bed Mobility;Bathing;Dressing;Toileting;Transfers;Locomotion Level    Examination-Participation Restrictions  Church;Meal Prep;Cleaning;Community Activity;Driving;Laundry    Stability/Clinical Decision Making  Evolving/Moderate complexity    Clinical Decision Making  Moderate    Rehab Potential  Good    PT Frequency  2x / week    PT Duration  8 weeks    PT Treatment/Interventions  ADLs/Self Care Home Management;Patient/family education;Functional  mobility training;Moist Heat;Therapeutic activities;Therapeutic exercise;Cryotherapy;Balance training;Neuromuscular re-education;Manual techniques;Dry needling    PT Next Visit Plan  progress ambulation, overall body strength    Consulted and Agree with Plan of Care  Patient;Family member/caregiver   husband  Patient will benefit from skilled therapeutic intervention in order to improve the following deficits and impairments:  Decreased range of motion, Difficulty walking, Decreased endurance, Decreased activity tolerance, Decreased balance, Decreased strength, Decreased mobility, Increased edema  Visit Diagnosis: 1. Muscle weakness (generalized)   2. Difficulty in walking, not elsewhere classified   3. Other symptoms and signs involving the musculoskeletal system        Problem List Patient Active Problem List   Diagnosis Date Noted  . Chest pain 09/28/2018  . Abdominal pain 09/28/2018  . HLD (hyperlipidemia) 09/28/2018  . Depression 09/28/2018  . Chronic systolic CHF (congestive heart failure) ([place]) 09/28/2018  . Iron deficiency 09/28/2018  . SOB (shortness of breath) 09/20/2018  . Hypertensive urgency 09/20/2018  . CKD (chronic kidney disease), stage III ([place]) 09/20/2018  . Nausea and vomiting 09/20/2018  . Hypothyroidism 09/20/2018  . Pleural effusion 09/20/2018  . Tobacco abuse 09/20/2018  . Tinea pedis 09/20/2018  . COPD (chronic obstructive pulmonary disease) ([place]) 09/20/2018  . Nephrolithiasis 09/20/2018  . Panic disorder with agoraphobia 05/09/2018    Jeral Pinch PT 10/05/2018, 12:07 PM  [facility] [address] [place], [place], [address] Phone: [phone]   Fax:  [phone]  Name: Shivaun Bilello MRN: [id number] Date of Birth: 08-10-1950   PHYSICAL THERAPY DISCHARGE SUMMARY  Visits from Start of Care: 1  Current functional level related to goals / functional outcomes: See above   Remaining  deficits: As above   Education / Equipment: Initial HEP Plan: Patient agrees to discharge.  Patient goals were not met. Patient is being discharged due to the physician's request. Husband called and reports that MD wants her to have HHPT at this time due to difficulty with mobility and level of fatigue with going out of the house.  ?????    Jeral Pinch, PT 10/17/18 8:49 AM

## 2018-10-09 ENCOUNTER — Telehealth: Payer: Self-pay | Admitting: Physician Assistant

## 2018-10-09 ENCOUNTER — Other Ambulatory Visit: Payer: Self-pay

## 2018-10-09 MED ORDER — TRAZODONE HCL 50 MG PO TABS: 50.0000 mg | ORAL_TABLET | Freq: Every day | ORAL | 1 refills | Status: DC

## 2018-10-09 NOTE — Telephone Encounter (Signed)
Spoke with pt and husband, her bp is running higher in the morning than the afternoon, 158/100 was the most recent. She reports feeling tired and exhausted all the time. No other complaints. Medications confirmed. She has a follow up appt with angela duke pa this Friday. Will forward to angela to review and advise.

## 2018-10-09 NOTE — Telephone Encounter (Signed)
Follow up:    Patient calling to see the status of her call ealrier. Please call patient back.

## 2018-10-09 NOTE — Telephone Encounter (Signed)
New Message    Patients spouse is calling on her behalf in reference to her BP. He says that she has loss of appetite.   Pt c/o BP issue:  1. What are your last 5 BP readings? 179/104, 181/110  2. Are you having any other symptoms (ex. Dizziness, headache, blurred vision, passed out)? No other symptoms 3. What is your medication issue? None

## 2018-10-09 NOTE — Telephone Encounter (Signed)
Mychart pending, smartphone, consent, pre reg complete 10/09/18 AF

## 2018-10-11 ENCOUNTER — Other Ambulatory Visit: Payer: Self-pay

## 2018-10-11 ENCOUNTER — Encounter: Payer: Self-pay | Admitting: Hematology & Oncology

## 2018-10-11 ENCOUNTER — Inpatient Hospital Stay: Payer: Medicare Other | Attending: Hematology & Oncology

## 2018-10-11 ENCOUNTER — Inpatient Hospital Stay (HOSPITAL_BASED_OUTPATIENT_CLINIC_OR_DEPARTMENT_OTHER): Payer: Medicare Other | Admitting: Hematology & Oncology

## 2018-10-11 VITALS — BP 163/81 | HR 86 | Temp 97.6°F | Resp 20 | Ht 66.0 in | Wt 134.8 lb

## 2018-10-11 DIAGNOSIS — R768 Other specified abnormal immunological findings in serum: Secondary | ICD-10-CM

## 2018-10-11 DIAGNOSIS — R630 Anorexia: Secondary | ICD-10-CM | POA: Diagnosis not present

## 2018-10-11 DIAGNOSIS — J42 Unspecified chronic bronchitis: Secondary | ICD-10-CM

## 2018-10-11 DIAGNOSIS — E611 Iron deficiency: Secondary | ICD-10-CM

## 2018-10-11 DIAGNOSIS — J90 Pleural effusion, not elsewhere classified: Secondary | ICD-10-CM | POA: Diagnosis not present

## 2018-10-11 DIAGNOSIS — R590 Localized enlarged lymph nodes: Secondary | ICD-10-CM

## 2018-10-11 DIAGNOSIS — R634 Abnormal weight loss: Secondary | ICD-10-CM | POA: Insufficient documentation

## 2018-10-11 DIAGNOSIS — N189 Chronic kidney disease, unspecified: Secondary | ICD-10-CM

## 2018-10-11 DIAGNOSIS — D649 Anemia, unspecified: Secondary | ICD-10-CM | POA: Diagnosis not present

## 2018-10-11 LAB — CMP (CANCER CENTER ONLY)
ALT: 14 U/L (ref 0–44)
AST: 13 U/L — ABNORMAL LOW (ref 15–41)
Albumin: 3.4 g/dL — ABNORMAL LOW (ref 3.5–5.0)
Alkaline Phosphatase: 71 U/L (ref 38–126)
Anion gap: 8 (ref 5–15)
BUN: 33 mg/dL — ABNORMAL HIGH (ref 8–23)
CO2: 23 mmol/L (ref 22–32)
Calcium: 9.7 mg/dL (ref 8.9–10.3)
Chloride: 108 mmol/L (ref 98–111)
Creatinine: 1.39 mg/dL — ABNORMAL HIGH (ref 0.44–1.00)
GFR, Est AFR Am: 44 mL/min — ABNORMAL LOW (ref >60)
GFR, Estimated: 38 mL/min — ABNORMAL LOW (ref >60)
Glucose, Bld: 110 mg/dL — ABNORMAL HIGH (ref 70–99)
Potassium: 4.6 mmol/L (ref 3.5–5.1)
Sodium: 139 mmol/L (ref 135–145)
Total Bilirubin: 0.3 mg/dL (ref 0.3–1.2)
Total Protein: 6.1 g/dL — ABNORMAL LOW (ref 6.5–8.1)

## 2018-10-11 LAB — CBC WITH DIFFERENTIAL (CANCER CENTER ONLY)
Abs Immature Granulocytes: 0.04 10*3/uL (ref 0.00–0.07)
Basophils Absolute: 0 10*3/uL (ref 0.0–0.1)
Basophils Relative: 0 %
Eosinophils Absolute: 0 10*3/uL (ref 0.0–0.5)
Eosinophils Relative: 0 %
HCT: 34.1 % — ABNORMAL LOW (ref 36.0–46.0)
Hemoglobin: 11.2 g/dL — ABNORMAL LOW (ref 12.0–15.0)
Immature Granulocytes: 1 %
Lymphocytes Relative: 33 %
Lymphs Abs: 1.5 10*3/uL (ref 0.7–4.0)
MCH: 32.1 pg (ref 26.0–34.0)
MCHC: 32.8 g/dL (ref 30.0–36.0)
MCV: 97.7 fL (ref 80.0–100.0)
Monocytes Absolute: 0.3 10*3/uL (ref 0.1–1.0)
Monocytes Relative: 7 %
Neutro Abs: 2.7 10*3/uL (ref 1.7–7.7)
Neutrophils Relative %: 59 %
Platelet Count: 197 10*3/uL (ref 150–400)
RBC: 3.49 MIL/uL — ABNORMAL LOW (ref 3.87–5.11)
RDW: 13.2 % (ref 11.5–15.5)
WBC Count: 4.6 10*3/uL (ref 4.0–10.5)
nRBC: 0 % (ref 0.0–0.2)

## 2018-10-11 LAB — SAVE SMEAR(SSMR), FOR PROVIDER SLIDE REVIEW

## 2018-10-12 ENCOUNTER — Ambulatory Visit: Payer: Medicare Other | Admitting: Physical Therapy

## 2018-10-12 LAB — IGG, IGA, IGM
IgA: 86 mg/dL — ABNORMAL LOW (ref 87–352)
IgG (Immunoglobin G), Serum: 138 mg/dL — ABNORMAL LOW (ref 586–1602)
IgM (Immunoglobulin M), Srm: 2036 mg/dL — ABNORMAL HIGH (ref 26–217)

## 2018-10-12 LAB — KAPPA/LAMBDA LIGHT CHAINS
Kappa free light chain: 248.2 mg/L — ABNORMAL HIGH (ref 3.3–19.4)
Kappa, lambda light chain ratio: 12.47 — ABNORMAL HIGH (ref 0.26–1.65)
Lambda free light chains: 19.9 mg/L (ref 5.7–26.3)

## 2018-10-12 LAB — IRON AND TIBC
Iron: 81 ug/dL (ref 41–142)
Saturation Ratios: 39 % (ref 21–57)
TIBC: 209 ug/dL — ABNORMAL LOW (ref 236–444)
UIBC: 128 ug/dL (ref 120–384)

## 2018-10-12 LAB — FERRITIN: Ferritin: 595 ng/mL — ABNORMAL HIGH (ref 11–307)

## 2018-10-12 NOTE — Telephone Encounter (Signed)
Follow up scheduled 6/26

## 2018-10-12 NOTE — Telephone Encounter (Signed)
I called to confirm appt for 10-13-18 with Fabian Sharp.

## 2018-10-12 NOTE — Progress Notes (Signed)
Hematology and Oncology Follow Up Visit  Tiffany Leon [id number] November 20, 1950 68 y.o. 10/12/2018   Principle Diagnosis:   Anemia, chronic renal insufficiency, probable rheumatoid arthritis  Current Therapy:    Observation     Interim History:  Tiffany Leon is is back for her first office visit.  I saw her in the hospital at [facility] a couple weeks ago.  At that time, she was admitted with anemia.  She had a large right pleural effusion.  She had this drained.  The pathology report however did not show any obvious malignant cells.  She had iron studies done in the hospital.  They looked okay.  She had vitamin B12 level also.  This was 864 on 10/01/2018.  Of interest, was the fact that she had an SPEP done.  She had a small M spike 1.1 g/dL.  We saw her today, her IgM level is quite high at 2036 mg/dL.  Her kappa light chain was 25 mg/dL.  When she was in the hospital, she had a markedly elevated rheumatoid factor of greater than 650.  Clearly, there is something going on.  I suspect that she might have an underlying lymphoma.  Is possible she may have Waldenstrm's.  She really does not feel well today.  She is at home.  She comes in with her husband.  She just hurts all over.  She has had some slight shortness of breath.  She has had a decreased appetite.  She has had some weight loss.  There is been no fever.  She has had no leg swelling.  I think she is getting some physical therapy at home.  Overall, I would say her performance status is probably ECOG 1-2.  Medications:  Current Outpatient Medications:  .  aspirin 81 MG chewable tablet, Chew 1 tablet (81 mg total) by mouth daily., Disp: , Rfl:  .  atorvastatin (LIPITOR) 40 MG tablet, Take 1 tablet (40 mg total) by mouth daily at 6 PM., Disp: 30 tablet, Rfl: 0 .  carvedilol (COREG) 6.25 MG tablet, Take 1 tablet (6.25 mg total) by mouth 2 (two) times daily with a meal., Disp: 30 tablet, Rfl: 0 .  clonazePAM (KLONOPIN) 0.5  MG tablet, TAKE ONE TABLET BY MOUTH TWICE DAILY, Disp: 180 tablet, Rfl: 0 .  clotrimazole (LOTRIMIN) 1 % cream, Apply topically 2 (two) times daily., Disp: 30 g, Rfl: 0 .  ferrous sulfate 325 (65 FE) MG tablet, Take 1 tablet (325 mg total) by mouth daily with breakfast., Disp: , Rfl: 3 .  folic acid (FOLVITE) 1 MG tablet, Take 1 tablet (1 mg total) by mouth daily., Disp: , Rfl:  .  furosemide (LASIX) 40 MG tablet, Take 1 tablet (40 mg total) by mouth daily., Disp: 30 tablet, Rfl: 1 .  isosorbide-hydrALAZINE (BIDIL) 20-37.5 MG tablet, Take 1 tablet by mouth 3 (three) times daily., Disp: 30 tablet, Rfl: 0 .  levothyroxine (SYNTHROID) 75 MCG tablet, Take 75 mcg by mouth daily., Disp: , Rfl:  .  nicotine (NICODERM CQ - DOSED IN MG/24 HOURS) 21 mg/24hr patch, Place 1 patch (21 mg total) onto the skin daily., Disp: 28 patch, Rfl: 0 .  PARoxetine (PAXIL) 40 MG tablet, TAKE 1.5 TABLETS BY MOUTH DAILY AT BEDTIME (Patient taking differently: Take 40 mg by mouth at bedtime. ), Disp: 135 tablet, Rfl: 1 .  potassium chloride (K-DUR) 10 MEQ tablet, Take 1 tablet (10 mEq total) by mouth daily., Disp: 30 tablet, Rfl: 1 .  traZODone (DESYREL)  50 MG tablet, Take 1-2 tablets (50-100 mg total) by mouth at bedtime., Disp: 180 tablet, Rfl: 1  Allergies:  Allergies  Allergen Reactions  . Codeine Nausea And Vomiting    Past Medical History, Surgical history, Social history, and Family History were reviewed and updated.  Review of Systems: Review of Systems  Constitutional: Positive for appetite change, fatigue and unexpected weight change.  HENT:   Positive for sore throat.   Eyes: Negative.   Respiratory: Positive for shortness of breath.   Cardiovascular: Negative.   Gastrointestinal: Positive for abdominal pain and nausea.  Endocrine: Negative.   Genitourinary: Negative.    Musculoskeletal: Positive for arthralgias and myalgias.  Skin: Negative.   Neurological: Positive for light-headedness.   Hematological: Negative.   Psychiatric/Behavioral: Negative.     Physical Exam:  height is 5\' 6"  (1.676 m) and weight is 134 lb 12.8 oz (61.1 kg). Her oral temperature is 97.6 F (36.4 C). Her blood pressure is 163/81 (abnormal) and her pulse is 86. Her respiration is 20 and oxygen saturation is 98%.   Wt Readings from Last 3 Encounters:  10/11/18 134 lb 12.8 oz (61.1 kg)  10/04/18 139 lb 8.8 oz (63.3 kg)  09/21/18 147 lb 11.3 oz (67 kg)    Physical Exam Vitals signs reviewed.  HENT:     Head: Normocephalic and atraumatic.  Eyes:     Pupils: Pupils are equal, round, and reactive to light.  Neck:     Musculoskeletal: Normal range of motion.     Comments: On exam of her neck, there might be some adenopathy on the left neck.  This is not prominent.  However, there does seem to feel a few swollen lymph nodes.  I really cannot palpate any adenopathy on the right neck. Cardiovascular:     Rate and Rhythm: Normal rate and regular rhythm.     Heart sounds: Normal heart sounds.  Pulmonary:     Effort: Pulmonary effort is normal.     Breath sounds: Normal breath sounds.  Abdominal:     General: Bowel sounds are normal.     Palpations: Abdomen is soft.  Musculoskeletal: Normal range of motion.        General: No tenderness or deformity.  Lymphadenopathy:     Cervical: No cervical adenopathy.  Skin:    General: Skin is warm and dry.     Findings: No erythema or rash.  Neurological:     Mental Status: She is alert and oriented to person, place, and time.  Psychiatric:        Behavior: Behavior normal.        Thought Content: Thought content normal.        Judgment: Judgment normal.      Lab Results  Component Value Date   WBC 4.6 10/11/2018   HGB 11.2 (L) 10/11/2018   HCT 34.1 (L) 10/11/2018   MCV 97.7 10/11/2018   PLT 197 10/11/2018     Chemistry      Component Value Date/Time   NA 139 10/11/2018 1418   K 4.6 10/11/2018 1418   CL 108 10/11/2018 1418   CO2 23  10/11/2018 1418   BUN 33 (H) 10/11/2018 1418   CREATININE 1.39 (H) 10/11/2018 1418      Component Value Date/Time   CALCIUM 9.7 10/11/2018 1418   ALKPHOS 71 10/11/2018 1418   AST 13 (L) 10/11/2018 1418   ALT 14 10/11/2018 1418   BILITOT 0.3 10/11/2018 1418  Impression and Plan: Ms. Reitan is a 69 year old white female.  I think it is clear that there is some issue going on with her.  By the markedly elevated IgM level, I have to worry about an underlying lymphoma.  I also wonder if there may not be Waldenstrm's   She is clearly going to need to have a CT scan done.  I am worried that the pleural effusion that was drained actually might be malignant.  We will have to watch this closely.  She just does not look all that great.  Her labs all look all that bad.  Her blood count certainly is a lot better.  We will try to get the scans done on her this week.  This will really let us know what is going on I feel.  Ultimately, she may need to have a biopsy done.  I spent about 40 minutes with she and her husband.  This was somewhat complicated.  She was hospitalized and then discharged quickly.  She was then rehospitalized again.  I did spend all the time with she and her husband going over all of her lab work.  Explained my recommendations.  And getting scans set up for her.  I will have her come back to see [place] once we have the scan results back.   [name], MD 6/25/20206:05 PM

## 2018-10-12 NOTE — Progress Notes (Signed)
Virtual Visit via Video Note   This visit type was conducted due to national recommendations for restrictions regarding the COVID-19 Pandemic (e.g. social distancing) in an effort to limit this patient's exposure and mitigate transmission in our community.  Due to her co-morbid illnesses, this patient is at least at moderate risk for complications without adequate follow up.  This format is felt to be most appropriate for this patient at this time.  All issues noted in this document were discussed and addressed.  A limited physical exam was performed with this format.  Please refer to the patient's chart for her consent to telehealth for [facility].   Date:  10/13/2018   ID:  Tiffany Leon, DOB Apr 06, 1951, MRN [id number]  Patient Location: Home Provider Location: Office  PCP:  Aretta Nip, MD  Cardiologist:  Pixie Casino, MD  Electrophysiologist:  None   Evaluation Performed:  Follow-Up Visit  Chief Complaint:  HFrEF, follow up hospitalization  History of Present Illness:    Tiffany Leon is a 68 y.o. female with a history of smoking, emphysema, GERD, depression, hypothyroidism, and anxiety. She previously had no cardiac history prior to a recent hospitalization. In March, she began to have daily N/V/diarrhea, found to have AKI and was referred to nephrology and GI. She had significant fatigue. She presented to the ER after 2-3 weeks of progressively worsening dyspnea, lower extremity swelling, and new orthopnea. COVID negative. CT with moderate right pleural effusion. Echocardiogram with EF of 40-45, diffuse hypokinesis, and trivial-small pericardial effusion.  She was mildly anemic with hemoglobin 9.0, and sed rate was elevated at 107. She underwent right thoracentesis with removal of 900cc transudative effusion.  She was seen by our service in consultation for heart failure on 09/20/18. In addition to a 45-month hx of N/V/D, she also had 2-4 week hx of dyspnea and LE edema. She  reported a 1-year history of exertional chest tightness. She was diuresed. She was hypertensive in the 150-180s, BB therapy and bidil were added. Medical therapy was optimized and she was discharged on 09/24/18. Of note, she was referred to rheumatology for elevated rheumatoid factor. Unfortunately, she presented back to the ER on 09/27/18 with abdominal pain, emesis, and chest pain. She was admitted with acute CHF exacerbation. She had not been compliant on a low salt diet. CT abd/pelvis was unremarkable. She was again optimized and discharged on 10/04/18.   ASA, lipitor, coreg 6.25 mg BID, 40 mg lasix daily, isosorbide-hydralazine 20-37.5, 10 mEq KCl.  She presents today for follow up. Her pressures have been elevated. Overall, she feels terrible and is unable to eat. She states she can barely move. Her husband is helping her do exercises (sitting, standing, very light weights). She states her lower extremity swelling is improved. However, she still has intermittent dyspnea which is the same as when she discharge. She also has intermittent chest discomfort. CP is in the center-lower chest and is worse than when she was hospitalized.  She has a history of nitro-responsive chest pain during her ER visit on 09/27/18. She is not diabetic, no family history of heart disease, and she is working on quitting smoking. CP is mostly at night and she suspects this is due to anxiety - fear that she can't breathe. She continues to have 2-pillow orthopnea and head of bed is raised 2 inches. She does not have chest pain when she does her exercises. Chest pain does not sound exertional/anginal in nature, but there is concern for ischemic  disease given her new systolic dysfunction.  Pressures have been elevated: 172/89, 181/96. She states her baseline pressure is normally 160-170s. Her BMP on 10/11/18 with improving renal function.   She was 134 lbs this morning. Before March, she was 138 lbs and was 138.9 lbs at discharge on  09/30/18. We will assume 139 lbs is her dry weight.    Bidil is too expensive. I will D/C and prescribe 50 mg hydralazine TID and 30 mg imdur daily. These will likely need to be titrated for optimal pressure control.    The patient does not have symptoms concerning for COVID-19 infection (fever, chills, cough, or new shortness of breath).    Past Medical History:  Diagnosis Date   Acute kidney injury ([place])    a. 09/2018   Anxiety    Aortic atherosclerosis ([place])    a. 09/2018 noted on Chest CT.   Chronic combined systolic (congestive) and diastolic (congestive) heart failure ([place])    a. 09/2018 Echo: EF 45-50%, diff HK. Triv to small circumfirential pericardial eff.   Chronic DOE (dyspnea on exertion)    Claudication ([place])    a. Bilat hip claudication since ~ 2019.   Depression    Dyspnea    Emphysema lung ([place])    Exertional angina ([place])    a. Ex angina since ~ 2019.   GERD (gastroesophageal reflux disease)    Hiatal hernia    History of bronchitis    History of kidney stones    Hypothyroidism    Pleural effusion    a. 09/2018 s/p thoracentesis-->963ml.   Pleural effusion 09/2018   Resting tremor    Tobacco abuse    Past Surgical History:  Procedure Laterality Date   BREAST LUMPECTOMY WITH RADIOACTIVE SEED LOCALIZATION Left 12/28/2017   Procedure: BREAST LUMPECTOMY WITH RADIOACTIVE SEED LOCALIZATION;  Surgeon: Autumn Messing III, MD;  Location: [place];  Service: General;  Laterality: Left;   DG THUMB RIGHT HAND ([facility] HX)     IR THORACENTESIS ASP PLEURAL SPACE W/IMG GUIDE  09/20/2018   KIDNEY STONE SURGERY       Current Meds  Medication Sig   aspirin 81 MG chewable tablet Chew 1 tablet (81 mg total) by mouth daily.   atorvastatin (LIPITOR) 40 MG tablet Take 1 tablet (40 mg total) by mouth daily at 6 PM.   carvedilol (COREG) 6.25 MG tablet Take 1 tablet (6.25 mg total) by mouth 2 (two) times daily with a meal.   clonazePAM (KLONOPIN) 0.5 MG tablet TAKE  ONE TABLET BY MOUTH TWICE DAILY   clotrimazole (LOTRIMIN) 1 % cream Apply topically 2 (two) times daily.   ferrous sulfate 325 (65 FE) MG tablet Take 1 tablet (325 mg total) by mouth daily with breakfast.   folic acid (FOLVITE) 1 MG tablet Take 1 tablet (1 mg total) by mouth daily.   furosemide (LASIX) 40 MG tablet Take 1 tablet (40 mg total) by mouth daily.   isosorbide-hydrALAZINE (BIDIL) 20-37.5 MG tablet Take 1 tablet by mouth 3 (three) times daily.   levothyroxine (SYNTHROID) 75 MCG tablet Take 75 mcg by mouth daily.   nicotine (NICODERM CQ - DOSED IN MG/24 HOURS) 21 mg/24hr patch Place 1 patch (21 mg total) onto the skin daily.   PARoxetine (PAXIL) 40 MG tablet Take 60 mg by mouth every morning.   potassium chloride (K-DUR) 10 MEQ tablet Take 1 tablet (10 mEq total) by mouth daily.   traZODone (DESYREL) 50 MG tablet Take 1-2 tablets (50-100 mg total)  by mouth at bedtime.   [DISCONTINUED] atorvastatin (LIPITOR) 40 MG tablet Take 1 tablet (40 mg total) by mouth daily at 6 PM.   [DISCONTINUED] carvedilol (COREG) 6.25 MG tablet Take 1 tablet (6.25 mg total) by mouth 2 (two) times daily with a meal.   [DISCONTINUED] furosemide (LASIX) 40 MG tablet Take 1 tablet (40 mg total) by mouth daily.   [DISCONTINUED] potassium chloride (K-DUR) 10 MEQ tablet Take 1 tablet (10 mEq total) by mouth daily.     Allergies:   Codeine   Social History   Tobacco Use   Smoking status: Current Every Day Smoker    Packs/day: 0.75    Years: 40.00    Pack years: 30.00    Types: Cigarettes   Smokeless tobacco: Never Used   Tobacco comment: smoking 3-4 cigarettes/day  Substance Use Topics   Alcohol use: Not Currently    Comment: 1-2 gl wine / night - none since 07/2018   Drug use: Not Currently    Comment: prev used drugs ~ 35 yrs ago.     Family Hx: The patient's family history includes Addison's disease in her sister; CAD in her brother; Dementia in her mother; Hypertension in her  brother and brother; Lung cancer in her father; Stroke in her mother.  ROS:   Please see the history of present illness.     All other systems reviewed and are negative.   Prior CV studies:   The following studies were reviewed today:  Echocardiogram 09/20/2018: 1. 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 The cavity size was normal. Left ventricular diastolic function could not be evaluated secondary to atrial fibrillation. Elevated left ventricular  end-diastolic pressure Left ventricular diffuse hypokinesis. 2. The right ventricle has normal systolic function. The cavity was normal. There is no increase in right ventricular wall thickness. Right ventricular systolic pressure could not be assessed 3. Trivial to small circumferential pericardial effusion is present with prominent fat pad over the RV. 4. The aortic valve was not well visualized. Aortic valve regurgitation was not assessed by color flow Doppler. 5. The inferior vena cava was normal in size with <50% respiratory variability. 6. The interatrial septum appears to be lipomatous.  Labs/Other Tests and Data Reviewed:    EKG:  An ECG dated 09/28/18 was personally reviewed today and demonstrated:  sinus rhythm, HR 93, nonspecific ST changes similar to prior  Recent Labs: 09/20/2018: TSH 5.145 09/28/2018: B Natriuretic Peptide 224.7 10/11/2018: ALT 14; BUN 33; Creatinine 1.39; Hemoglobin 11.2; Platelet Count 197; Potassium 4.6; Sodium 139   Recent Lipid Panel Lab Results  Component Value Date/Time   CHOL 121 09/28/2018 03:28 AM   TRIG 102 09/28/2018 03:28 AM   HDL 29 (L) 09/28/2018 03:28 AM   CHOLHDL 4.2 09/28/2018 03:28 AM   LDLCALC 72 09/28/2018 03:28 AM    Wt Readings from Last 3 Encounters:  10/13/18 134 lb 1.6 oz (60.8 kg)  10/11/18 134 lb 12.8 oz (61.1 kg)  10/04/18 139 lb 8.8 oz (63.3 kg)     Objective:    Vital Signs:  BP (!) 181/96    Pulse 77    Ht 5'  6" (1.676 m)    Wt 134 lb 1.6 oz (60.8 kg)    BMI 21.64 kg/m    VITAL SIGNS:  reviewed GEN:  no acute distress EYES:  sclerae anicteric, EOMI - Extraocular Movements Intact RESPIRATORY:  normal respiratory effort, symmetric expansion CARDIOVASCULAR:  no peripheral edema  SKIN:  no rash, lesions or ulcers. MUSCULOSKELETAL:  no obvious deformities. NEURO:  alert and oriented x 3, no obvious focal deficit PSYCH:  normal affect  ASSESSMENT & PLAN:     Chronic systolic heart failure Lower extremity edema Orthopnea  Improved swelling, will hold off on LE vascular studies for now. She still has orthopnea with 2 pillows and head of bed elevated 2 inches. She is below her dry weight of 139 lbs, but I suspect this is due to anorexia. I encouraged Boost and protein, low salt diet. She wants to stay at her current dose of lasix and I agree with this, especially in light of her renal function.   Respiratory failure Recurrent Right Pleural effusion This is certainly complicating her clinical picture. Unclear if her pleural effusion remains resolved. She will follow with PCP for this.     CKD stage III Renal function is improving. sCr 1.39. She denies any history of kidney disease. Unclear if this is due to dehydration.  Renal arterial dopplers per nephrology Will make ambulatory referral to nephrology   Chest pain She describes nitro-responsive chest pain, but also describes chest pain that is consistent with anxiety surrounding her breathing. She needs an ischemic evaluation. R/L heart cath was deferred during her hospitalization secondary to renal impairment. As her renal function improves, may still consider angiography vs CT coronary vs nuclear stress test. Would favor definitive angiography in this patient which would allow evaluation of heart pressures in the setting of pleural effusion.    Hyperlipidemia 09/28/2018: Cholesterol 121; HDL 29; LDL Cholesterol 72; Triglycerides 102; VLDL  20 Will decrease to 20 mg lipitor to see if this improves body aches and joint pain.   Hypertension Pressures are elevated in the 270-623J systolic. Bidil is too expensive. I will switch to 50 mg hydralazine TID and 30 mg imdur daily. Will likely have to titrate medications, but given her overall clinical picture I will start low and titrate carefully.   Difficult situation given new rheumatology findings, involvement of heme/onc, need for nephrology, and possibly recurrent pleural effusion. Its unclear if an etiology was found for her N/V/D. She is now anorexic and struggling to eat and move. Complicated by +/- anginal symptoms and volume overload from new heart failure. She needs to be seen in the office to better assess her volume status.      COVID-19 Education: The signs and symptoms of COVID-19 were discussed with the patient and how to seek care for testing (follow up with PCP or arrange E-visit).  The importance of social distancing was discussed today.  Time:   Today, I have spent 40 minutes with the patient with telehealth technology discussing the above problems.     Medication Adjustments/Labs and Tests Ordered: Current medicines are reviewed at length with the patient today.  Concerns regarding medicines are outlined above.   Tests Ordered: Orders Placed This Encounter  Procedures   Ambulatory referral to Nephrology    Medication Changes: Meds ordered this encounter  Medications   carvedilol (COREG) 6.25 MG tablet    Sig: Take 1 tablet (6.25 mg total) by mouth 2 (two) times daily with a meal.    Dispense:  90 tablet    Refill:  3   atorvastatin (LIPITOR) 40 MG tablet    Sig: Take 1 tablet (40 mg total) by mouth daily at 6 PM.    Dispense:  90 tablet    Refill:  3   furosemide (LASIX) 40 MG tablet  Sig: Take 1 tablet (40 mg total) by mouth daily.    Dispense:  90 tablet    Refill:  3   potassium chloride (K-DUR) 10 MEQ tablet    Sig: Take 1 tablet (10 mEq  total) by mouth daily.    Dispense:  90 tablet    Refill:  3   hydrALAZINE (APRESOLINE) 50 MG tablet    Sig: Take 1 tablet (50 mg total) by mouth 3 (three) times daily.    Dispense:  270 tablet    Refill:  3   isosorbide mononitrate (IMDUR) 30 MG 24 hr tablet    Sig: Take 1 tablet (30 mg total) by mouth daily.    Dispense:  90 tablet    Refill:  3    Follow Up:  In Person in 3 day(s)  Signed, Ledora Bottcher, [place]  10/13/2018 1:26 PM    [facility]

## 2018-10-13 ENCOUNTER — Encounter: Payer: Self-pay | Admitting: Physician Assistant

## 2018-10-13 ENCOUNTER — Telehealth (INDEPENDENT_AMBULATORY_CARE_PROVIDER_SITE_OTHER): Payer: Medicare Other | Admitting: Physician Assistant

## 2018-10-13 ENCOUNTER — Telehealth: Payer: Self-pay | Admitting: Physician Assistant

## 2018-10-13 VITALS — BP 181/96 | HR 77 | Ht 66.0 in | Wt 134.1 lb

## 2018-10-13 DIAGNOSIS — R0601 Orthopnea: Secondary | ICD-10-CM

## 2018-10-13 DIAGNOSIS — E785 Hyperlipidemia, unspecified: Secondary | ICD-10-CM

## 2018-10-13 DIAGNOSIS — R2242 Localized swelling, mass and lump, left lower limb: Secondary | ICD-10-CM | POA: Diagnosis not present

## 2018-10-13 DIAGNOSIS — N183 Chronic kidney disease, stage 3 unspecified: Secondary | ICD-10-CM

## 2018-10-13 DIAGNOSIS — J90 Pleural effusion, not elsewhere classified: Secondary | ICD-10-CM | POA: Diagnosis not present

## 2018-10-13 DIAGNOSIS — R630 Anorexia: Secondary | ICD-10-CM

## 2018-10-13 DIAGNOSIS — I5022 Chronic systolic (congestive) heart failure: Secondary | ICD-10-CM

## 2018-10-13 DIAGNOSIS — R0789 Other chest pain: Secondary | ICD-10-CM

## 2018-10-13 MED ORDER — HYDRALAZINE HCL 50 MG PO TABS: 50.0000 mg | ORAL_TABLET | Freq: Three times a day (TID) | ORAL | 3 refills | Status: DC

## 2018-10-13 MED ORDER — CARVEDILOL 6.25 MG PO TABS: 6.2500 mg | ORAL_TABLET | Freq: Two times a day (BID) | ORAL | 3 refills | Status: DC

## 2018-10-13 MED ORDER — ATORVASTATIN CALCIUM 40 MG PO TABS: 40.0000 mg | ORAL_TABLET | Freq: Every day | ORAL | 3 refills | Status: DC

## 2018-10-13 MED ORDER — POTASSIUM CHLORIDE CRYS ER 10 MEQ PO TBCR: 10.0000 meq | EXTENDED_RELEASE_TABLET | Freq: Every day | ORAL | 3 refills | Status: DC

## 2018-10-13 MED ORDER — FUROSEMIDE 40 MG PO TABS: 40.0000 mg | ORAL_TABLET | Freq: Every day | ORAL | 3 refills | Status: DC

## 2018-10-13 MED ORDER — ISOSORBIDE MONONITRATE ER 30 MG PO TB24: 30.0000 mg | ORAL_TABLET | Freq: Every day | ORAL | 3 refills | Status: DC

## 2018-10-13 NOTE — Patient Instructions (Addendum)
Medication Instructions:  STOP Bidil START- Hydralazine 50 mg THREE times daily START- Isosorbide(Imdur) 30 mg daily  If you need a refill on your cardiac medications before your next appointment, please call your pharmacy.  Labwork: None Ordered   Follow-Up:  Your physician recommends that you schedule a follow-up appointment on Monday, 10/16/18.  At [facility], you and your health needs are our priority.  As part of our continuing mission to provide you with exceptional heart care, we have created designated Provider Care Teams.  These Care Teams include your primary Cardiologist (physician) and Advanced Practice Providers (APPs -  Physician Assistants and Nurse Practitioners) who all work together to provide you with the care you need, when you need it.  Thank you for choosing [facility] at [facility]!!

## 2018-10-13 NOTE — Telephone Encounter (Signed)
Called patient, she states it is a telephone visit- explained how that is done, patient verbalized understanding.

## 2018-10-13 NOTE — Telephone Encounter (Signed)
New message:    Patient calling she needs a understanding on how to do her appt. Please call patient.

## 2018-10-16 ENCOUNTER — Encounter: Payer: Self-pay | Admitting: *Deleted

## 2018-10-16 ENCOUNTER — Encounter: Payer: Self-pay | Admitting: Internal Medicine

## 2018-10-16 ENCOUNTER — Ambulatory Visit (INDEPENDENT_AMBULATORY_CARE_PROVIDER_SITE_OTHER): Payer: Medicare Other | Admitting: Internal Medicine

## 2018-10-16 ENCOUNTER — Other Ambulatory Visit: Payer: Self-pay

## 2018-10-16 VITALS — BP 168/88 | HR 74 | Temp 97.3°F | Ht 66.0 in | Wt 133.0 lb

## 2018-10-16 DIAGNOSIS — R0789 Other chest pain: Secondary | ICD-10-CM

## 2018-10-16 DIAGNOSIS — I5022 Chronic systolic (congestive) heart failure: Secondary | ICD-10-CM | POA: Diagnosis not present

## 2018-10-16 MED ORDER — CARVEDILOL 12.5 MG PO TABS: 12.5000 mg | ORAL_TABLET | Freq: Two times a day (BID) | ORAL | 11 refills | Status: DC

## 2018-10-16 NOTE — Progress Notes (Signed)
Patient was seen in the office last week for anemia. Upon further workup, Dr Marin Olp believes this patient may have lymphoma. He requested that I get her scans scheduled and let patient know.  CT scans scheduled for 10/18/18. Called an spoke to patient's husband and let him know about the CT scans and need for patient to pick up contrast. He understands and will pick up contrast today. Will continue to follow as needed.

## 2018-10-16 NOTE — Progress Notes (Addendum)
OFFICE NOTE  Chief Complaint:  Fatigue, weakness, joint pain  Primary Care Physician: Aretta Nip, MD  HPI:  Tiffany Leon is a 68 y.o. female with a past medial history significant for chronic combined systolic and diastolic congestive heart failure with recent echo in June 2020 showed EF 45 to 50% and diffuse hypokinesis with trivial to small pericardial effusion, aortic atherosclerosis and coronary artery calcifications noted on chest CT, acute kidney injury, pleural effusion status post thoracentesis, recent elevated sedimentation rate greater than 100 and other medical problems.  She was seen by me recently in the hospital for heart failure.  Initially she had acute renal failure and markedly elevated sedimentation rate.  She responded to diuresis with a plan for outpatient medication adjustment and ischemia work-up.  She was seen in a telemedicine visit by Doreene Adas, PA-C on 10/13/2018.  Blood pressure and still not been well controlled.  She was placed on BiDil in the hospital however this was cost effective and therefore was changed to hydralazine and Imdur.  Today her blood pressure remains elevated 168/88.  She is accompanied by her husband who notes that her blood pressures were quite high at home up to 588 systolic.  She says she feels poorly.  She was in a wheelchair and cannot walk up into the office today.  She gets chills but no fevers.  Recent COVID testing was negative in the hospital.  She has seen Dr. Marin Olp with oncology who feels that she may have either hematologic malignancy and/or rheumatoid arthritis.  She did have a high RF test and given her high sedimentation rate this is a reasonable differential diagnosis.  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  Pan CT scans are pending as well as rheumatologic referral.  Heart failure wise she denies any worsening edema.  Her weight is much lower than dry weight by about 7 pounds today.  Appetite is  decreased.  Remains on the diuretic.  Multiple labs ordered by her oncologist about 4 days ago are pending.  PMHx:  Past Medical History:  Diagnosis Date   Acute kidney injury ([place])    a. 09/2018   Anxiety    Aortic atherosclerosis ([place])    a. 09/2018 noted on Chest CT.   Chronic combined systolic (congestive) and diastolic (congestive) heart failure ([place])    a. 09/2018 Echo: EF 45-50%, diff HK. Triv to small circumfirential pericardial eff.   Chronic DOE (dyspnea on exertion)    Claudication ([place])    a. Bilat hip claudication since ~ 2019.   Depression    Dyspnea    Emphysema lung ([place])    Exertional angina ([place])    a. Ex angina since ~ 2019.   GERD (gastroesophageal reflux disease)    Hiatal hernia    History of bronchitis    History of kidney stones    Hypothyroidism    Pleural effusion    a. 09/2018 s/p thoracentesis-->916m.   Pleural effusion 09/2018   Resting tremor    Tobacco abuse     Past Surgical History:  Procedure Laterality Date   BREAST LUMPECTOMY WITH RADIOACTIVE SEED LOCALIZATION Left 12/28/2017   Procedure: BREAST LUMPECTOMY WITH RADIOACTIVE SEED LOCALIZATION;  Surgeon: TJovita Kussmaul MD;  Location: [facility]  Service: General;  Laterality: Left;   DG THUMB RIGHT HAND ([facility] HX)     IR THORACENTESIS ASP PLEURAL SPACE W/IMG GUIDE  09/20/2018   KIDNEY STONE SURGERY      FAMHx:  Family History  Problem Relation Age of Onset   Stroke Mother        Mini-strokes. Died @ 30.   Dementia Mother    Lung cancer Father        Died in his 48's   Addison's disease Sister    Hypertension Brother    CAD Brother    Hypertension Brother     SOCHx:   reports that she has been smoking cigarettes. She has a 30.00 pack-year smoking history. She has never used smokeless tobacco. She reports previous alcohol use. She reports previous drug use.  ALLERGIES:  Allergies  Allergen Reactions   Codeine Nausea And Vomiting    ROS: Pertinent  items noted in HPI and remainder of comprehensive ROS otherwise negative.  HOME MEDS: Current Outpatient Medications on File Prior to Visit  Medication Sig Dispense Refill   aspirin 81 MG chewable tablet Chew 1 tablet (81 mg total) by mouth daily.     atorvastatin (LIPITOR) 40 MG tablet Take 1 tablet (40 mg total) by mouth daily at 6 PM. 90 tablet 3   clonazePAM (KLONOPIN) 0.5 MG tablet TAKE ONE TABLET BY MOUTH TWICE DAILY 180 tablet 0   clotrimazole (LOTRIMIN) 1 % cream Apply topically 2 (two) times daily. 30 g 0   ferrous sulfate 325 (65 FE) MG tablet Take 1 tablet (325 mg total) by mouth daily with breakfast.  3   folic acid (FOLVITE) 1 MG tablet Take 1 tablet (1 mg total) by mouth daily.     furosemide (LASIX) 40 MG tablet Take 1 tablet (40 mg total) by mouth daily. 90 tablet 3   hydrALAZINE (APRESOLINE) 50 MG tablet Take 1 tablet (50 mg total) by mouth 3 (three) times daily. 270 tablet 3   isosorbide mononitrate (IMDUR) 30 MG 24 hr tablet Take 1 tablet (30 mg total) by mouth daily. 90 tablet 3   levothyroxine (SYNTHROID) 75 MCG tablet Take 75 mcg by mouth daily.     nicotine (NICODERM CQ - DOSED IN MG/24 HOURS) 21 mg/24hr patch Place 1 patch (21 mg total) onto the skin daily. 28 patch 0   PARoxetine (PAXIL) 40 MG tablet Take 60 mg by mouth every morning.     potassium chloride (K-DUR) 10 MEQ tablet Take 1 tablet (10 mEq total) by mouth daily. 90 tablet 3   traZODone (DESYREL) 50 MG tablet Take 1-2 tablets (50-100 mg total) by mouth at bedtime. 180 tablet 1   No current facility-administered medications on file prior to visit.     LABS/IMAGING: No results found for this or any previous visit (from the past 48 hour(s)). No results found.  LIPID PANEL:    Component Value Date/Time   CHOL 121 09/28/2018 0328   TRIG 102 09/28/2018 0328   HDL 29 (L) 09/28/2018 0328   CHOLHDL 4.2 09/28/2018 0328   VLDL 20 09/28/2018 0328   LDLCALC 72 09/28/2018 0328     WEIGHTS: Wt  Readings from Last 3 Encounters:  10/16/18 133 lb (60.3 kg)  10/13/18 134 lb 1.6 oz (60.8 kg)  10/11/18 134 lb 12.8 oz (61.1 kg)    VITALS: BP (!) 168/88    Pulse 74    Temp (!) 97.3 F (36.3 C)    Ht 5' 6"  (1.676 m)    Wt 133 lb (60.3 kg)    SpO2 96%    BMI 21.47 kg/m   EXAM: General appearance: alert, no distress and Appears thin and fatigued in wheelchair Neck: no carotid  bruit, no JVD and thyroid not enlarged, symmetric, no tenderness/mass/nodules Lungs: diminished breath sounds bilaterally Heart: regular rate and rhythm Abdomen: soft, non-tender; bowel sounds normal; no masses,  no organomegaly Extremities: extremities normal, atraumatic, no cyanosis or edema Pulses: 2+ and symmetric Skin: Pale, cool, dry Neurologic: Mental status: Alert, oriented, thought content appropriate Psych: Fatigue  EKG: Deferred  ASSESSMENT: 1. Acute on chronic systolic congestive heart failure-LVEF 45% 2. Multivessel coronary artery calcification 3. Elevated ESR-possible rheumatoid arthritis versus malignancy 4. Recent acute kidney injury  PLAN: 1.   Ms. Loper has a number of medical problems and continues to feel poorly.  She underwent recent evaluation by her oncologist who ordered a number of tests and additional imaging studies which are still pending.  She was referred to rheumatology but has not yet been able to get an appointment.  From a heart failure standpoint she appears euvolemic at this point.  Weight is much lower.  Blood pressure still not well controlled I recommend increasing her carvedilol further to 12.5 mg twice daily.  She may need an additional increase in hydralazine up to 75 mg 3 times daily.  Family will contact us with blood pressure readings at home.  An ischemic work-up is certainly recommended however she is not in any condition for heart catheterization at this point.  In addition I do not think she would do well with a stress test either or a coronary CT.  I would defer  ischemia evaluation at this point as she is not having anginal symptoms although she does get some chest pain symptoms they are at rest and worse with movement and positioning and did not sound like angina to me.  Plan follow-up closely in 1 month.  Pixie Casino, MD, [facility], [place] Director of the Advanced Lipid Disorders &  Cardiovascular Risk Reduction Clinic Diplomate of the American Board of Clinical Lipidology Attending Cardiologist  Direct Dial: [phone]   Fax: [phone]  Website:  [email or web address] [email or web address] Clelia [email or web address] 10/16/2018, 9:59 AM

## 2018-10-16 NOTE — Patient Instructions (Signed)
Medication Instructions:  Your physician has recommended you make the following change in your medication:  -- INCREASE carvedilol to 12.5mg  twice daily  If you need a refill on your cardiac medications before your next appointment, please call your pharmacy.    Follow-Up: At [facility], you and your health needs are our priority.  As part of our continuing mission to provide you with exceptional heart care, we have created designated Provider Care Teams.  These Care Teams include your primary Cardiologist (physician) and Advanced Practice Providers (APPs -  Physician Assistants and Nurse Practitioners) who all work together to provide you with the care you need, when you need it. You will need a follow up appointment in 1 month. You may see Pixie Casino, MD or one of the following Advanced Practice Providers on your designated Care Team: [place], [place] . Fabian Sharp, PA-C  Any Other Special Instructions Will Be Listed Below (If Applicable).

## 2018-10-17 ENCOUNTER — Ambulatory Visit: Payer: Medicare Other | Admitting: Physical Therapy

## 2018-10-17 LAB — PROTEIN ELECTROPHORESIS, SERUM, WITH REFLEX
A/G Ratio: 1.1 (ref 0.7–1.7)
Albumin ELP: 3.3 g/dL (ref 2.9–4.4)
Alpha-1-Globulin: 0.3 g/dL (ref 0.0–0.4)
Alpha-2-Globulin: 0.8 g/dL (ref 0.4–1.0)
Beta Globulin: 0.7 g/dL (ref 0.7–1.3)
Gamma Globulin: 1.2 g/dL (ref 0.4–1.8)
Globulin, Total: 3 g/dL (ref 2.2–3.9)
M-Spike, %: 0.9 g/dL — ABNORMAL HIGH
SPEP Interpretation: 0
Total Protein ELP: 6.3 g/dL (ref 6.0–8.5)

## 2018-10-17 LAB — IMMUNOFIXATION REFLEX, SERUM
IgA: 92 mg/dL (ref 87–352)
IgG (Immunoglobin G), Serum: 145 mg/dL — ABNORMAL LOW (ref 586–1602)
IgM (Immunoglobulin M), Srm: 1939 mg/dL — ABNORMAL HIGH (ref 26–217)

## 2018-10-18 ENCOUNTER — Other Ambulatory Visit: Payer: Self-pay

## 2018-10-18 ENCOUNTER — Ambulatory Visit (HOSPITAL_BASED_OUTPATIENT_CLINIC_OR_DEPARTMENT_OTHER)
Admission: RE | Admit: 2018-10-18 | Discharge: 2018-10-18 | Disposition: A | Discharge status: Home / Self Care | Payer: Medicare Other | Source: Ambulatory Visit | Attending: Hematology & Oncology | Admitting: Hematology & Oncology

## 2018-10-18 ENCOUNTER — Encounter (HOSPITAL_BASED_OUTPATIENT_CLINIC_OR_DEPARTMENT_OTHER): Payer: Self-pay

## 2018-10-18 DIAGNOSIS — R21 Rash and other nonspecific skin eruption: Secondary | ICD-10-CM

## 2018-10-18 DIAGNOSIS — R590 Localized enlarged lymph nodes: Secondary | ICD-10-CM | POA: Insufficient documentation

## 2018-10-18 HISTORY — DX: Rash and other nonspecific skin eruption: R21

## 2018-10-18 MED ORDER — IOHEXOL 300 MG/ML  SOLN
100.0000 mL | Freq: Once | INTRAMUSCULAR | Status: AC | PRN
  Administered 2018-10-18: 80 mL via INTRAVENOUS

## 2018-10-19 ENCOUNTER — Other Ambulatory Visit: Payer: Self-pay | Admitting: Hematology & Oncology

## 2018-10-19 ENCOUNTER — Ambulatory Visit: Payer: Medicare Other | Admitting: Physical Therapy

## 2018-10-19 ENCOUNTER — Telehealth: Payer: Self-pay | Admitting: Internal Medicine

## 2018-10-19 DIAGNOSIS — D472 Monoclonal gammopathy: Secondary | ICD-10-CM

## 2018-10-19 NOTE — Telephone Encounter (Signed)
Returned call to patient but she is napping. Spoke with husband Legrand Como   They are concerned about continued elevated BP. Saw MD on 6/29 and carvedilol dose was increased.   6/30 145/89 165/86 171/102  7/1 177/102 175/96  7/2  197/118 @ 10am (taken same time as AM meds) 177/100 @ 1030am 158/87 @ 11:19am  Husband reports patient is anxious and "things bother her". Explained that stress, anxiety, pain can all contribute to high BP.   Advised would send message to MD & CVRR for review and advice

## 2018-10-19 NOTE — Telephone Encounter (Signed)
Recommend increasing hydralazine to 1.5 tablets (75mg ) 3x a day. Continue other medications and continue to monitor BP at home. Pt can call in BP readings next week with an update. She may also continue to see benefit from her increase in carvedilol dose as this was just increased a few days ago and can take a few days for pt to see effects on BP readings.

## 2018-10-19 NOTE — Telephone Encounter (Signed)
Patient called stating she was seen on Monday on 6/29, her BP medication was adjusted, she states she is still having high Bps:  Today's readings are: 197/118 at 10am 177/100 at 1030am 158/87 at 1119am  6/30   7/1 145/89   177/102 165/86   175/96 171/102   Patient wants to know if medication needs to be adjusted again or if she needs to give more time for medication adjustment to work.    She was recently switched to  isosorbide mononitrate (IMDUR) 30 MG 24 hr tablet hydrALAZINE (APRESOLINE) 50 MG tablet

## 2018-10-19 NOTE — Telephone Encounter (Signed)
Patient's husband called w/recommendations. He voiced understanding of plan. Med list updated. No refill needed at this time.

## 2018-10-20 ENCOUNTER — Encounter

## 2018-10-23 ENCOUNTER — Ambulatory Visit: Payer: Medicare Other | Admitting: Physical Therapy

## 2018-10-24 ENCOUNTER — Inpatient Hospital Stay: Payer: Medicare Other

## 2018-10-24 ENCOUNTER — Other Ambulatory Visit: Payer: Self-pay | Admitting: Radiology

## 2018-10-24 ENCOUNTER — Inpatient Hospital Stay: Payer: Medicare Other | Attending: Hematology & Oncology | Admitting: Hematology & Oncology

## 2018-10-24 ENCOUNTER — Other Ambulatory Visit: Payer: Self-pay

## 2018-10-24 ENCOUNTER — Encounter: Payer: Self-pay | Admitting: Hematology & Oncology

## 2018-10-24 VITALS — BP 140/67 | HR 79 | Temp 98.2°F | Resp 18 | Ht 66.0 in | Wt 129.8 lb

## 2018-10-24 DIAGNOSIS — N183 Chronic kidney disease, stage 3 unspecified: Secondary | ICD-10-CM

## 2018-10-24 DIAGNOSIS — E611 Iron deficiency: Secondary | ICD-10-CM

## 2018-10-24 DIAGNOSIS — Z5112 Encounter for antineoplastic immunotherapy: Secondary | ICD-10-CM | POA: Insufficient documentation

## 2018-10-24 DIAGNOSIS — M069 Rheumatoid arthritis, unspecified: Secondary | ICD-10-CM | POA: Insufficient documentation

## 2018-10-24 DIAGNOSIS — Z7982 Long term (current) use of aspirin: Secondary | ICD-10-CM | POA: Diagnosis not present

## 2018-10-24 DIAGNOSIS — F502 Bulimia nervosa: Secondary | ICD-10-CM

## 2018-10-24 DIAGNOSIS — C880 Waldenstrom macroglobulinemia: Secondary | ICD-10-CM | POA: Insufficient documentation

## 2018-10-24 DIAGNOSIS — N189 Chronic kidney disease, unspecified: Secondary | ICD-10-CM | POA: Insufficient documentation

## 2018-10-24 DIAGNOSIS — R590 Localized enlarged lymph nodes: Secondary | ICD-10-CM

## 2018-10-24 DIAGNOSIS — Z79899 Other long term (current) drug therapy: Secondary | ICD-10-CM | POA: Insufficient documentation

## 2018-10-24 DIAGNOSIS — D631 Anemia in chronic kidney disease: Secondary | ICD-10-CM

## 2018-10-24 DIAGNOSIS — D649 Anemia, unspecified: Secondary | ICD-10-CM | POA: Diagnosis not present

## 2018-10-24 DIAGNOSIS — R21 Rash and other nonspecific skin eruption: Secondary | ICD-10-CM

## 2018-10-24 LAB — CMP (CANCER CENTER ONLY)
ALT: 14 U/L (ref 0–44)
AST: 12 U/L — ABNORMAL LOW (ref 15–41)
Albumin: 3.3 g/dL — ABNORMAL LOW (ref 3.5–5.0)
Alkaline Phosphatase: 73 U/L (ref 38–126)
Anion gap: 9 (ref 5–15)
BUN: 36 mg/dL — ABNORMAL HIGH (ref 8–23)
CO2: 21 mmol/L — ABNORMAL LOW (ref 22–32)
Calcium: 8.6 mg/dL — ABNORMAL LOW (ref 8.9–10.3)
Chloride: 109 mmol/L (ref 98–111)
Creatinine: 1.69 mg/dL — ABNORMAL HIGH (ref 0.44–1.00)
GFR, Est AFR Am: 35 mL/min — ABNORMAL LOW (ref >60)
GFR, Estimated: 30 mL/min — ABNORMAL LOW (ref >60)
Glucose, Bld: 106 mg/dL — ABNORMAL HIGH (ref 70–99)
Potassium: 5.1 mmol/L (ref 3.5–5.1)
Sodium: 139 mmol/L (ref 135–145)
Total Bilirubin: 0.3 mg/dL (ref 0.3–1.2)
Total Protein: 5.9 g/dL — ABNORMAL LOW (ref 6.5–8.1)

## 2018-10-24 LAB — CBC WITH DIFFERENTIAL (CANCER CENTER ONLY)
Abs Immature Granulocytes: 0.03 10*3/uL (ref 0.00–0.07)
Basophils Absolute: 0 10*3/uL (ref 0.0–0.1)
Basophils Relative: 0 %
Eosinophils Absolute: 0.1 10*3/uL (ref 0.0–0.5)
Eosinophils Relative: 3 %
HCT: 34.8 % — ABNORMAL LOW (ref 36.0–46.0)
Hemoglobin: 11.1 g/dL — ABNORMAL LOW (ref 12.0–15.0)
Immature Granulocytes: 1 %
Lymphocytes Relative: 26 %
Lymphs Abs: 1.1 10*3/uL (ref 0.7–4.0)
MCH: 31.5 pg (ref 26.0–34.0)
MCHC: 31.9 g/dL (ref 30.0–36.0)
MCV: 98.9 fL (ref 80.0–100.0)
Monocytes Absolute: 0.3 10*3/uL (ref 0.1–1.0)
Monocytes Relative: 6 %
Neutro Abs: 2.7 10*3/uL (ref 1.7–7.7)
Neutrophils Relative %: 64 %
Platelet Count: 205 10*3/uL (ref 150–400)
RBC: 3.52 MIL/uL — ABNORMAL LOW (ref 3.87–5.11)
RDW: 13.4 % (ref 11.5–15.5)
WBC Count: 4.1 10*3/uL (ref 4.0–10.5)
nRBC: 0 % (ref 0.0–0.2)

## 2018-10-24 LAB — SAVE SMEAR(SSMR), FOR PROVIDER SLIDE REVIEW

## 2018-10-24 LAB — LACTATE DEHYDROGENASE: LDH: 207 U/L — ABNORMAL HIGH (ref 98–192)

## 2018-10-24 MED ORDER — FLUCONAZOLE 100 MG PO TABS: 100.0000 mg | ORAL_TABLET | Freq: Every day | ORAL | 2 refills | Status: DC

## 2018-10-24 MED ORDER — SODIUM CHLORIDE 0.9 % IV SOLN
40.0000 mg | Freq: Once | INTRAVENOUS | Status: AC
  Administered 2018-10-24: 40 mg via INTRAVENOUS
  Filled 2018-10-24: qty 4

## 2018-10-24 MED ORDER — SODIUM CHLORIDE 0.9 % IV SOLN
20.0000 mg | Freq: Once | INTRAVENOUS | Status: AC
  Administered 2018-10-24: 20 mg via INTRAVENOUS
  Filled 2018-10-24: qty 2

## 2018-10-24 MED ORDER — DEXAMETHASONE SODIUM PHOSPHATE 10 MG/ML IJ SOLN: 20.0000 mg | Freq: Once | INTRAMUSCULAR | Status: DC

## 2018-10-24 MED ORDER — FAMOTIDINE 40 MG PO TABS: 40.0000 mg | ORAL_TABLET | Freq: Two times a day (BID) | ORAL | 2 refills | Status: DC

## 2018-10-24 MED ORDER — SODIUM CHLORIDE 0.9 % IV SOLN
INTRAVENOUS | Status: DC
  Administered 2018-10-24: 13:00:00 via INTRAVENOUS
  Filled 2018-10-24: qty 250

## 2018-10-24 MED ORDER — PREDNISONE 20 MG PO TABS: 20.0000 mg | ORAL_TABLET | Freq: Every day | ORAL | 0 refills | Status: DC

## 2018-10-24 NOTE — Progress Notes (Signed)
Hematology and Oncology Follow Up Visit  Tiffany Leon [id number] 08-18-1950 68 y.o. 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 Hosterman is is back for her second office visit.  I am still not sure as to what is going on with him.  I do know that she does have a elevated IgM protein.  Her IgM protein was 2300 mg/dL.  She did have an M spike of 0.9 g/dL.  We did do a CT of her body.  This included the neck.  Of course, this did not really show [place] anything.  There is no adenopathy.  There is no hepatomegaly.  There is no splenomegaly.  She now comes in with this rash.  I have no clue as to what could be causing this rash.  It is a macular type rash.  It started on Sunday.  She is on no new medications.  It does cause a lot of pruritus.  This rash is all over her body.  Is quite extensive on her back.  She probably needs to see a dermatologist to have this biopsy.  I will know this might be vasculitis.  I still feel that she has some underlying autoimmune or collagen vascular issue given that she had a very high rheumatoid factor level in the hospital.  She still is not eating all that well.  She has had no diarrhea.  There is been no vomiting.  She is due for her bone marrow biopsy tomorrow.  Hopefully this will give [place] an idea as to what might be causing the IgM elevation.  This is an incredibly complicated situation.   Overall, I would say her performance status is probably ECOG 1-2.  Medications:  Current Outpatient Medications:  .  aspirin 81 MG chewable tablet, Chew 1 tablet (81 mg total) by mouth daily., Disp: , Rfl:  .  atorvastatin (LIPITOR) 40 MG tablet, Take 1 tablet (40 mg total) by mouth daily at 6 PM., Disp: 90 tablet, Rfl: 3 .  carvedilol (COREG) 12.5 MG tablet, Take 1 tablet (12.5 mg total) by mouth 2 (two) times daily with a meal., Disp: 60 tablet,  Rfl: 11 .  clonazePAM (KLONOPIN) 0.5 MG tablet, TAKE ONE TABLET BY MOUTH TWICE DAILY, Disp: 180 tablet, Rfl: 0 .  clotrimazole (LOTRIMIN) 1 % cream, Apply topically 2 (two) times daily., Disp: 30 g, Rfl: 0 .  ferrous sulfate 325 (65 FE) MG tablet, Take 1 tablet (325 mg total) by mouth daily with breakfast., Disp: , Rfl: 3 .  folic acid (FOLVITE) 1 MG tablet, Take 1 tablet (1 mg total) by mouth daily., Disp: , Rfl:  .  furosemide (LASIX) 40 MG tablet, Take 1 tablet (40 mg total) by mouth daily., Disp: 90 tablet, Rfl: 3 .  hydrALAZINE (APRESOLINE) 50 MG tablet, Take 75 mg by mouth 3 (three) times daily., Disp: , Rfl:  .  isosorbide mononitrate (IMDUR) 30 MG 24 hr tablet, Take 1 tablet (30 mg total) by mouth daily., Disp: 90 tablet, Rfl: 3 .  levothyroxine (SYNTHROID) 75 MCG tablet, Take 75 mcg by mouth daily., Disp: , Rfl:  .  nicotine (NICODERM CQ - DOSED IN MG/24 HOURS) 21 mg/24hr patch, Place 1 patch (21 mg total) onto the skin daily., Disp: 28 patch, Rfl: 0 .  PARoxetine (PAXIL) 40 MG tablet, Take 60 mg by mouth every morning., Disp: , Rfl:  .  potassium chloride (K-DUR) 10 MEQ tablet, Take 1 tablet (10 mEq total) by mouth daily., Disp: 90 tablet, Rfl: 3 .  traZODone (DESYREL) 50 MG tablet, Take 1-2 tablets (50-100 mg total) by mouth at bedtime., Disp: 180 tablet, Rfl: 1 .  famotidine (PEPCID) 40 MG tablet, Take 1 tablet (40 mg total) by mouth 2 (two) times daily., Disp: 60 tablet, Rfl: 2 .  fluconazole (DIFLUCAN) 100 MG tablet, Take 1 tablet (100 mg total) by mouth daily., Disp: 30 tablet, Rfl: 2 .  predniSONE (DELTASONE) 20 MG tablet, Take 1 tablet (20 mg total) by mouth daily with breakfast., Disp: 30 tablet, Rfl: 0  Current Facility-Administered Medications:  .  0.9 %  sodium chloride infusion, , Intravenous, Continuous, , Rudell Cobb, MD .  famotidine (PEPCID) 40 mg in sodium chloride 0.9 % 50 mL IVPB, 40 mg, Intravenous, Once, , Rudell Cobb, MD  Facility-Administered Medications  Ordered in Other Visits:  .  dexamethasone (DECADRON) 20 mg in sodium chloride 0.9 % 50 mL IVPB, 20 mg, Intravenous, Once, , Rudell Cobb, MD  Allergies:  Allergies  Allergen Reactions  . Codeine Nausea And Vomiting    Past Medical History, Surgical history, Social history, and Family History were reviewed and updated.  Review of Systems: Review of Systems  Constitutional: Positive for appetite change, fatigue and unexpected weight change.  HENT:   Positive for sore throat.   Eyes: Negative.   Respiratory: Positive for shortness of breath.   Cardiovascular: Negative.   Gastrointestinal: Positive for abdominal pain and nausea.  Endocrine: Negative.   Genitourinary: Negative.    Musculoskeletal: Positive for arthralgias and myalgias.  Skin: Negative.   Neurological: Positive for light-headedness.  Hematological: Negative.   Psychiatric/Behavioral: Negative.     Physical Exam:  height is 5' 6"  (1.676 m) and weight is 129 lb 12.8 oz (58.9 kg). Her oral temperature is 98.2 F (36.8 C). Her blood pressure is 140/67 and her pulse is 79. Her respiration is 18 and oxygen saturation is 100%.   Wt Readings from Last 3 Encounters:  10/24/18 129 lb 12.8 oz (58.9 kg)  10/16/18 133 lb (60.3 kg)  10/13/18 134 lb 1.6 oz (60.8 kg)    Physical Exam Vitals signs reviewed.  HENT:     Head: Normocephalic and atraumatic.  Eyes:     Pupils: Pupils are equal, round, and reactive to light.  Neck:     Musculoskeletal: Normal range of motion.     Comments: On exam of her neck, there might be some adenopathy on the left neck.  This is not prominent.  However, there does seem to feel a few swollen lymph nodes.  I really cannot palpate any adenopathy on the right neck. Cardiovascular:     Rate and Rhythm: Normal rate and regular rhythm.     Heart sounds: Normal heart sounds.  Pulmonary:     Effort: Pulmonary effort is normal.     Breath sounds: Normal breath sounds.  Abdominal:     General:  Bowel sounds are normal.     Palpations: Abdomen is soft.  Musculoskeletal: Normal range of motion.        General: No tenderness or deformity.  Lymphadenopathy:     Cervical: No cervical adenopathy.  Skin:    Findings: No erythema or rash.     Comments: Skin exam shows is macular rash.  It is on her entire body.  Does not on her face.  Is extensive on her back.  It is less  so but still prominent on her abdominal wall.  It is on her legs.  It does not blanch.  Neurological:     Mental Status: She is alert and oriented to person, place, and time.  Psychiatric:        Behavior: Behavior normal.        Thought Content: Thought content normal.        Judgment: Judgment normal.      Lab Results  Component Value Date   WBC 4.1 10/24/2018   HGB 11.1 (L) 10/24/2018   HCT 34.8 (L) 10/24/2018   MCV 98.9 10/24/2018   PLT 205 10/24/2018     Chemistry      Component Value Date/Time   NA 139 10/24/2018 1119   K 5.1 10/24/2018 1119   CL 109 10/24/2018 1119   CO2 21 (L) 10/24/2018 1119   BUN 36 (H) 10/24/2018 1119   CREATININE 1.69 (H) 10/24/2018 1119      Component Value Date/Time   CALCIUM 8.6 (L) 10/24/2018 1119   ALKPHOS 73 10/24/2018 1119   AST 12 (L) 10/24/2018 1119   ALT 14 10/24/2018 1119   BILITOT 0.3 10/24/2018 1119       Impression and Plan: Ms. Palinkas is a 67 year old white female.  I think it is clear that there is some issue going on with her.  By the markedly elevated IgM level, I have to worry about an underlying lymphoma.  I also wonder if there may not be [place]'s   She is clearly going to need to have a CT scan done.  Again, the bone marrow biopsy will be critical here.  I really have to try to help with this rash.  Again I am not sure as to what the etiology of this rash is.  Again I do not know she has some kind of autoimmune issue.  I will give her some IV steroids and IV Pepcid in the office.  We will see if this helps a little bit.  I will also put  her on some oral Pepcid and prednisone at home.  If this rash does not improve, she really has to go see a dermatologist to have this biopsied.  We still have to think about getting her to a rheumatologist.  I do not think that they would do anything with her if the found out that she does have lymphoma.  Again this is incredibly complicated.  It took about an hour to try to sort everything out.  This rash was totally unexpected and needed to be treated.  I will have to try to get her back here in another couple weeks.  Hopefully, things would not worsen so that she has to end up in the hospital.  [name], MD 7/7/202012:46 PM

## 2018-10-24 NOTE — Patient Instructions (Signed)
Famotidine injection What is this medicine? FAMOTIDINE (fa MOE ti deen) is a type of antihistamine that blocks the release of stomach acid. It is used to treat stomach or intestinal ulcers. It can relieve ulcer pain and discomfort, and the heartburn from acid reflux. This medicine may be used for other purposes; ask your health care provider or pharmacist if you have questions. COMMON BRAND NAME(S): Pepcid What should I tell my health care provider before I take this medicine? They need to know if you have any of these conditions:  kidney or liver disease  an unusual or allergic reaction to famotidine, other medicines, foods, dyes, or preservatives  pregnant or trying to get pregnant  breast-feeding How should I use this medicine? This medicine is for infusion into a vein. It is given by a health care professional in a hospital or clinic setting. Talk to your pediatrician regarding the use of this medicine in children. Special care may be needed. Overdosage: If you think you have taken too much of this medicine contact a poison control center or emergency room at once. NOTE: This medicine is only for you. Do not share this medicine with others. What if I miss a dose? This does not apply. What may interact with this medicine?  delavirdine  itraconazole  ketoconazole This list may not describe all possible interactions. Give your health care provider a list of all the medicines, herbs, non-prescription drugs, or dietary supplements you use. Also tell them if you smoke, drink alcohol, or use illegal drugs. Some items may interact with your medicine. What should I watch for while using this medicine? Tell your doctor or health care professional if your condition does not start to get better or gets worse. Do not take with aspirin, ibuprofen, or other antiinflammatory medicines. These can aggravate your condition. Do not smoke cigarettes or drink alcohol. These increase irritation in your  stomach and can increase the time it will take for ulcers to heal. Cigarettes and alcohol can also worsen acid reflux or heartburn. If you get black, tarry stools or vomit up what looks like coffee grounds, call your doctor or health care professional at once. You may have a bleeding ulcer. This medicine may cause a decrease in vitamin B12. You should make sure that you get enough vitamin B12 while you are taking this medicine. Discuss the foods you eat and the vitamins you take with your health care professional. What side effects may I notice from receiving this medicine? Side effects that you should report to your doctor or health care professional as soon as possible:  allergic reactions like skin rash, itching or hives, swelling of the face, lips, or tongue  agitation, nervousness  confusion  hallucinations Side effects that usually do not require medical attention (report to your doctor or health care professional if they continue or are bothersome):  constipation  diarrhea  dizziness  headache This list may not describe all possible side effects. Call your doctor for medical advice about side effects. You may report side effects to FDA at 1-800-FDA-1088. Where should I keep my medicine? This medicine is given in a hospital or clinic. You will not be given this medicine to store at home. NOTE: This sheet is a summary. It may not cover all possible information. If you have questions about this medicine, talk to your doctor, pharmacist, or health care provider.  2020 [employer] Standard (2016-11-19 13:16:46) Dexamethasone injection What is this medicine? DEXAMETHASONE (dex a METH a sone) is a  corticosteroid. It is used to treat inflammation of the skin, joints, lungs, and other organs. Common conditions treated include asthma, allergies, and arthritis. It is also used for other conditions, like blood disorders and diseases of the adrenal glands. This medicine may be used for  other purposes; ask your health care provider or pharmacist if you have questions. COMMON BRAND NAME(S): Decadron, DoubleDex, Simplist Dexamethasone, Solurex What should I tell my health care provider before I take this medicine? They need to know if you have any of these conditions:  blood clotting problems  Cushing's syndrome  diabetes  glaucoma  heart problems or disease  high blood pressure  infection like herpes, measles, tuberculosis, or chickenpox  kidney disease  liver disease  mental problems  myasthenia gravis  osteoporosis  previous heart attack  seizures  stomach, ulcer or intestine disease including colitis and diverticulitis  thyroid problem  an unusual or allergic reaction to dexamethasone, corticosteroids, other medicines, lactose, foods, dyes, or preservatives  pregnant or trying to get pregnant  breast-feeding How should I use this medicine? This medicine is for injection into a muscle, joint, lesion, soft tissue, or vein. It is given by a health care professional in a hospital or clinic setting. Talk to your pediatrician regarding the use of this medicine in children. Special care may be needed. Overdosage: If you think you have taken too much of this medicine contact a poison control center or emergency room at once. NOTE: This medicine is only for you. Do not share this medicine with others. What if I miss a dose? This may not apply. If you are having a series of injections over a prolonged period, try not to miss an appointment. Call your doctor or health care professional to reschedule if you are unable to keep an appointment. What may interact with this medicine? Do not take this medicine with any of the following medications:  mifepristone, RU-486  vaccines This medicine may also interact with the following medications:  amphotericin B  antibiotics like clarithromycin, erythromycin, and troleandomycin  aspirin and aspirin-like  drugs  barbiturates like phenobarbital  carbamazepine  cholestyramine  cholinesterase inhibitors like donepezil, galantamine, rivastigmine, and tacrine  cyclosporine  digoxin  diuretics  ephedrine  female hormones, like estrogens or progestins and birth control pills  indinavir  isoniazid  ketoconazole  medicines for diabetes  medicines that improve muscle tone or strength for conditions like myasthenia gravis  NSAIDs, medicines for pain and inflammation, like ibuprofen or naproxen  phenytoin  rifampin  thalidomide  warfarin This list may not describe all possible interactions. Give your health care provider a list of all the medicines, herbs, non-prescription drugs, or dietary supplements you use. Also tell them if you smoke, drink alcohol, or use illegal drugs. Some items may interact with your medicine. What should I watch for while using this medicine? Your condition will be monitored carefully while you are receiving this medicine. If you are taking this medicine for a long time, carry an identification card with your name and address, the type and dose of your medicine, and your doctor's name and address. This medicine may increase your risk of getting an infection. Stay away from people who are sick. Tell your doctor or health care professional if you are around anyone with measles or chickenpox. Talk to your health care provider before you get any vaccines that you take this medicine. If you are going to have surgery, tell your doctor or health care professional that you have taken  this medicine within the last twelve months. Ask your doctor or health care professional about your diet. You may need to lower the amount of salt you eat. This medicine may increase blood sugar. Ask your healthcare provider if changes in diet or medicines are needed if you have diabetes. What side effects may I notice from receiving this medicine? Side effects that you should report  to your doctor or health care professional as soon as possible:  allergic reactions like skin rash, itching or hives, swelling of the face, lips, or tongue  black or tarry stools  change in the amount of urine  confusion, excitement, restlessness, a false sense of well-being  fever, sore throat, sneezing, cough, or other signs of infection, wounds that will not heal  hallucinations  mental depression, mood swings, mistaken feelings of self importance or of being mistreated  pain in hips, back, ribs, arms, shoulders, or legs  pain, redness, or irritation at the injection site  redness, blistering, peeling or loosening of the skin, including inside the mouth  rounding out of face   signs and symptoms of high blood sugar such as being more thirsty or hungry or having to urinate more than normal. You may also feel very tired or have blurry vision.  swelling of feet or lower legs  unusual bleeding or bruising  wounds that do not heal Side effects that usually do not require medical attention (report to your doctor or health care professional if they continue or are bothersome):  diarrhea or constipation  change in taste  headache  nausea, vomiting  skin problems, acne, thin and shiny skin  trouble sleeping  unusual growth of hair on the face or body  weight gain This list may not describe all possible side effects. Call your doctor for medical advice about side effects. You may report side effects to FDA at 1-800-FDA-1088. Where should I keep my medicine? This drug is given in a hospital or clinic and will not be stored at home. NOTE: This sheet is a summary. It may not cover all possible information. If you have questions about this medicine, talk to your doctor, pharmacist, or health care provider.  2020 [employer] Standard (2018-01-04 10:35:53)

## 2018-10-25 ENCOUNTER — Telehealth: Payer: Self-pay | Admitting: *Deleted

## 2018-10-25 ENCOUNTER — Other Ambulatory Visit: Payer: Self-pay | Admitting: Student

## 2018-10-25 ENCOUNTER — Encounter: Payer: Medicare Other | Admitting: Physical Therapy

## 2018-10-25 LAB — IGG, IGA, IGM
IgA: 83 mg/dL — ABNORMAL LOW (ref 87–352)
IgG (Immunoglobin G), Serum: 136 mg/dL — ABNORMAL LOW (ref 586–1602)
IgM (Immunoglobulin M), Srm: 1599 mg/dL — ABNORMAL HIGH (ref 26–217)

## 2018-10-25 LAB — KAPPA/LAMBDA LIGHT CHAINS
Kappa free light chain: 297.5 mg/L — ABNORMAL HIGH (ref 3.3–19.4)
Kappa, lambda light chain ratio: 11.49 — ABNORMAL HIGH (ref 0.26–1.65)
Lambda free light chains: 25.9 mg/L (ref 5.7–26.3)

## 2018-10-25 LAB — ERYTHROPOIETIN: Erythropoietin: 5.7 m[IU]/mL (ref 2.6–18.5)

## 2018-10-25 NOTE — Telephone Encounter (Signed)
Call placed to patient to check on her rash per request of Dr. Marin Olp.  Spoke with pt.'s husband and he states that patient states that the rash is "slightly better" and that the itching is "slightly better" as well.  Instructed pt.'s husband to call this office back if rash worsens or any other concerns arise.  Pt.'s husband appreciative of call.

## 2018-10-26 ENCOUNTER — Ambulatory Visit (HOSPITAL_COMMUNITY)
Admission: RE | Admit: 2018-10-26 | Discharge: 2018-10-26 | Disposition: A | Discharge status: Home / Self Care | Payer: Medicare Other | Source: Ambulatory Visit | Attending: Hematology & Oncology | Admitting: Hematology & Oncology

## 2018-10-26 ENCOUNTER — Other Ambulatory Visit: Payer: Self-pay

## 2018-10-26 ENCOUNTER — Encounter (HOSPITAL_COMMUNITY): Payer: Self-pay

## 2018-10-26 DIAGNOSIS — Z7982 Long term (current) use of aspirin: Secondary | ICD-10-CM | POA: Diagnosis not present

## 2018-10-26 DIAGNOSIS — D539 Nutritional anemia, unspecified: Secondary | ICD-10-CM | POA: Diagnosis not present

## 2018-10-26 DIAGNOSIS — J439 Emphysema, unspecified: Secondary | ICD-10-CM | POA: Insufficient documentation

## 2018-10-26 DIAGNOSIS — Z823 Family history of stroke: Secondary | ICD-10-CM | POA: Diagnosis not present

## 2018-10-26 DIAGNOSIS — D472 Monoclonal gammopathy: Secondary | ICD-10-CM

## 2018-10-26 DIAGNOSIS — F329 Major depressive disorder, single episode, unspecified: Secondary | ICD-10-CM | POA: Diagnosis not present

## 2018-10-26 DIAGNOSIS — Z8249 Family history of ischemic heart disease and other diseases of the circulatory system: Secondary | ICD-10-CM | POA: Insufficient documentation

## 2018-10-26 DIAGNOSIS — Z79890 Hormone replacement therapy (postmenopausal): Secondary | ICD-10-CM | POA: Insufficient documentation

## 2018-10-26 DIAGNOSIS — Z79899 Other long term (current) drug therapy: Secondary | ICD-10-CM | POA: Diagnosis not present

## 2018-10-26 DIAGNOSIS — F419 Anxiety disorder, unspecified: Secondary | ICD-10-CM | POA: Diagnosis not present

## 2018-10-26 DIAGNOSIS — Z87442 Personal history of urinary calculi: Secondary | ICD-10-CM | POA: Insufficient documentation

## 2018-10-26 DIAGNOSIS — F17210 Nicotine dependence, cigarettes, uncomplicated: Secondary | ICD-10-CM | POA: Insufficient documentation

## 2018-10-26 DIAGNOSIS — E209 Hypoparathyroidism, unspecified: Secondary | ICD-10-CM | POA: Diagnosis not present

## 2018-10-26 DIAGNOSIS — I5042 Chronic combined systolic (congestive) and diastolic (congestive) heart failure: Secondary | ICD-10-CM | POA: Insufficient documentation

## 2018-10-26 DIAGNOSIS — K219 Gastro-esophageal reflux disease without esophagitis: Secondary | ICD-10-CM | POA: Diagnosis not present

## 2018-10-26 DIAGNOSIS — Z801 Family history of malignant neoplasm of trachea, bronchus and lung: Secondary | ICD-10-CM | POA: Insufficient documentation

## 2018-10-26 LAB — CBC WITH DIFFERENTIAL/PLATELET
Abs Immature Granulocytes: 0.17 10*3/uL — ABNORMAL HIGH (ref 0.00–0.07)
Basophils Absolute: 0 10*3/uL (ref 0.0–0.1)
Basophils Relative: 0 %
Eosinophils Absolute: 0 10*3/uL (ref 0.0–0.5)
Eosinophils Relative: 0 %
HCT: 30.8 % — ABNORMAL LOW (ref 36.0–46.0)
Hemoglobin: 9.5 g/dL — ABNORMAL LOW (ref 12.0–15.0)
Immature Granulocytes: 2 %
Lymphocytes Relative: 14 %
Lymphs Abs: 1.2 10*3/uL (ref 0.7–4.0)
MCH: 31.1 pg (ref 26.0–34.0)
MCHC: 30.8 g/dL (ref 30.0–36.0)
MCV: 101 fL — ABNORMAL HIGH (ref 80.0–100.0)
Monocytes Absolute: 0.4 10*3/uL (ref 0.1–1.0)
Monocytes Relative: 4 %
Neutro Abs: 6.6 10*3/uL (ref 1.7–7.7)
Neutrophils Relative %: 80 %
Platelets: 237 10*3/uL (ref 150–400)
RBC: 3.05 MIL/uL — ABNORMAL LOW (ref 3.87–5.11)
RDW: 13.5 % (ref 11.5–15.5)
WBC: 8.4 10*3/uL (ref 4.0–10.5)
nRBC: 0 % (ref 0.0–0.2)

## 2018-10-26 LAB — PROTIME-INR
INR: 0.9 (ref 0.8–1.2)
Prothrombin Time: 12.2 seconds (ref 11.4–15.2)

## 2018-10-26 MED ORDER — LIDOCAINE HCL (PF) 1 % IJ SOLN
INTRAMUSCULAR | Status: AC | PRN
  Administered 2018-10-26: 10 mL

## 2018-10-26 MED ORDER — MIDAZOLAM HCL 2 MG/2ML IJ SOLN
INTRAMUSCULAR | Status: AC
  Filled 2018-10-26: qty 4

## 2018-10-26 MED ORDER — FENTANYL CITRATE (PF) 100 MCG/2ML IJ SOLN
INTRAMUSCULAR | Status: AC
  Filled 2018-10-26: qty 2

## 2018-10-26 MED ORDER — SODIUM CHLORIDE 0.9 % IV SOLN
INTRAVENOUS | Status: DC
  Administered 2018-10-26: 08:00:00 via INTRAVENOUS

## 2018-10-26 MED ORDER — FENTANYL CITRATE (PF) 100 MCG/2ML IJ SOLN
INTRAMUSCULAR | Status: AC | PRN
  Administered 2018-10-26 (×2): 50 ug via INTRAVENOUS

## 2018-10-26 MED ORDER — MIDAZOLAM HCL 2 MG/2ML IJ SOLN
INTRAMUSCULAR | Status: AC | PRN
  Administered 2018-10-26 (×2): 1 mg via INTRAVENOUS

## 2018-10-26 NOTE — H&P (Signed)
Chief Complaint: Patient was seen in consultation today for IgM lambda monoclonal gammopathy/bone marrow biopsy and aspiration.  Referring Physician(s): Ennever,Peter R  Supervising Physician: Corrie Mckusick  Patient Status: [facility] - Out-pt  History of Present Illness: Tiffany Leon is a 68 y.o. female with a past medical history of HF, emphysema, bronchitis, GERD, hiatal hernia, AKI, nephrolithiasis, hypoparathyroidism, anxiety, depression, and tobacco abuse. She was recently admitted to [facility] 09/27/2018 to 10/04/2018 for management of anemia. During that time, lab results also demonstrated IgM lambda monoclonal gammopathy. After discharge, she followed-up with Dr. Marin Olp to discuss management options who recommended IR consultation for bone marrow biopsy/aspiration.  IR requested by Dr. Marin Olp for possible image-guided bone marrow biopsy/aspiration. Patient awake and alert laying in bed. Complains of dyspnea- states she has COPD and this is normal for her. Denies fever, chills, chest pain, abdominal pain, or headache.   Past Medical History:  Diagnosis Date  . Acute kidney injury ([place])    a. 09/2018  . Anxiety   . Aortic atherosclerosis ([place])    a. 09/2018 noted on Chest CT.  Marland Kitchen Chronic combined systolic (congestive) and diastolic (congestive) heart failure ([place])    a. 09/2018 Echo: EF 45-50%, diff HK. Triv to small circumfirential pericardial eff.  . Chronic DOE (dyspnea on exertion)   . Claudication [facility])    a. Bilat hip claudication since ~ 2019.  Marland Kitchen Depression   . Dyspnea   . Emphysema lung ([place])   . Exertional angina ([facility])    a. Ex angina since ~ 2019.  Marland Kitchen GERD (gastroesophageal reflux disease)   . Hiatal hernia   . History of bronchitis   . History of kidney stones   . Hypothyroidism   . Pleural effusion    a. 09/2018 s/p thoracentesis-->935m.  . Pleural effusion 09/2018  . Rash 10/2018   Rash began Saturday with unknown reason.  MD seen Monday 10/23/18  . Resting tremor    . Tobacco abuse     Past Surgical History:  Procedure Laterality Date  . BREAST LUMPECTOMY WITH RADIOACTIVE SEED LOCALIZATION Left 12/28/2017   Procedure: BREAST LUMPECTOMY WITH RADIOACTIVE SEED LOCALIZATION;  Surgeon: TJovita Kussmaul MD;  [place] [facility]  Service: General;  Laterality: Left;  . DG THUMB RIGHT HAND ([place])    . IR THORACENTESIS ASP PLEURAL SPACE W/IMG GUIDE  09/20/2018  . KIDNEY STONE SURGERY      Allergies: Codeine  Medications: Prior to Admission medications   Medication Sig Start Date End Date Taking? Authorizing Provider  aspirin 81 MG chewable tablet Chew 1 tablet (81 mg total) by mouth daily. 09/25/18  Yes MGeorgette Shell MD  atorvastatin (LIPITOR) 40 MG tablet Take 1 tablet (40 mg total) by mouth daily at 6 PM. 10/13/18  Yes Duke, ATami Lin PA  carvedilol (COREG) 12.5 MG tablet Take 1 tablet (12.5 mg total) by mouth 2 (two) times daily with a meal. 10/16/18  Yes Hilty, KNadean Corwin MD  clonazePAM (KLONOPIN) 0.5 MG tablet TAKE ONE TABLET BY MOUTH TWICE DAILY 10/05/18  Yes Cottle, CBilley Co, MD  clotrimazole (LOTRIMIN) 1 % cream Apply topically 2 (two) times daily. 09/24/18  Yes MGeorgette Shell MD  famotidine (PEPCID) 40 MG tablet Take 1 tablet (40 mg total) by mouth 2 (two) times daily. 10/24/18  Yes EVolanda Napoleon MD  ferrous sulfate 325 (65 FE) MG tablet Take 1 tablet (325 mg total) by mouth daily with breakfast. 09/24/18  Yes MGeorgette Shell MD  fluconazole (DIFLUCAN) 100 MG tablet Take 1 tablet (100 mg total) by mouth daily. 10/24/18  Yes Ennever, Rudell Cobb, MD  folic acid (FOLVITE) 1 MG tablet Take 1 tablet (1 mg total) by mouth daily. 09/24/18  Yes Georgette Shell, MD  furosemide (LASIX) 40 MG tablet Take 1 tablet (40 mg total) by mouth daily. 10/13/18  Yes Duke, Tami Lin, PA  hydrALAZINE (APRESOLINE) 50 MG tablet Take 75 mg by mouth 3 (three) times daily.   Yes [provider]  isosorbide mononitrate (IMDUR) 30 MG 24 hr tablet  Take 1 tablet (30 mg total) by mouth daily. 10/13/18 10/08/19 Yes Duke, Tami Lin, PA  levothyroxine (SYNTHROID) 75 MCG tablet Take 75 mcg by mouth daily. 09/07/18  Yes [provider]  PARoxetine (PAXIL) 40 MG tablet Take 60 mg by mouth every morning.   Yes [provider]  potassium chloride (K-DUR) 10 MEQ tablet Take 1 tablet (10 mEq total) by mouth daily. 10/13/18  Yes Duke, Tami Lin, PA  predniSONE (DELTASONE) 20 MG tablet Take 1 tablet (20 mg total) by mouth daily with breakfast. 10/24/18  Yes Ennever, Rudell Cobb, MD  traZODone (DESYREL) 50 MG tablet Take 1-2 tablets (50-100 mg total) by mouth at bedtime. 10/09/18  Yes Cottle, Billey Co., MD  nicotine (NICODERM CQ - DOSED IN MG/24 HOURS) 21 mg/24hr patch Place 1 patch (21 mg total) onto the skin daily. 09/24/18   Georgette Shell, MD     Family History  Problem Relation Age of Onset  . Stroke Mother        Mini-strokes. Died @ 17.  . Dementia Mother   . Lung cancer Father        Died in his 24's  . Addison's disease Sister   . Hypertension Brother   . CAD Brother   . Hypertension Brother     Social History   Socioeconomic History  . Marital status: Married    Spouse name: Not on file  . Number of children: Not on file  . Years of education: Not on file  . Highest education level: Not on file  Occupational History  . Not on file  Social Needs  . Financial resource strain: Not on file  . Food insecurity    Worry: Not on file    Inability: Not on file  . Transportation needs    Medical: Not on file    Non-medical: Not on file  Tobacco Use  . Smoking status: Current Every Day Smoker    Packs/day: 0.25    Years: 40.00    Pack years: 10.00    Types: Cigarettes  . Smokeless tobacco: Never Used  . Tobacco comment: smoking 3-4 cigarettes/day  Substance and Sexual Activity  . Alcohol use: Not Currently    Comment: 1-2 gl wine / night - none since 07/2018  . Drug use: Not Currently    Comment: prev  used drugs ~ 35 yrs ago.  Marland Kitchen Sexual activity: Not on file  Lifestyle  . Physical activity    Days per week: Not on file    Minutes per session: Not on file  . Stress: Not on file  Relationships  . Social Herbalist on phone: Not on file    Gets together: Not on file    Attends religious service: Not on file    Active member of club or organization: Not on file    Attends meetings of clubs or organizations: Not on file  Relationship status: Not on file  Other Topics Concern  . Not on file  Social History Narrative   Lives in [place] with her husband.  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     Review of Systems: A 12 point ROS discussed and pertinent positives are indicated in the HPI above.  All other systems are negative.  Review of Systems  Constitutional: Negative for chills and fever.  Respiratory: Positive for shortness of breath. Negative for wheezing.   Cardiovascular: Negative for chest pain and palpitations.  Gastrointestinal: Negative for abdominal pain.  Neurological: Negative for headaches.  Psychiatric/Behavioral: Negative for behavioral problems and confusion.    Vital Signs: BP (!) 148/85 (BP Location: Right Arm)   Pulse 81   Temp 98.6 F (37 C) (Oral)   Resp 18   SpO2 98%   Physical Exam Vitals signs and nursing note reviewed.  Constitutional:      General: She is not in acute distress.    Appearance: Normal appearance.  Cardiovascular:     Rate and Rhythm: Normal rate and regular rhythm.     Heart sounds: Normal heart sounds. No murmur.  Pulmonary:     Effort: Pulmonary effort is normal. No respiratory distress.     Breath sounds: Normal breath sounds. No wheezing.  Skin:    General: Skin is warm and dry.  Neurological:     Mental Status: She is alert and oriented to person, place, and time.  Psychiatric:        Mood and Affect: Mood normal.        Behavior: Behavior normal.        Thought Content: Thought content normal.         Judgment: Judgment normal.      MD Evaluation Airway: WNL Heart: WNL Abdomen: WNL Chest/ Lungs: WNL ASA  Classification: 3 Mallampati/Airway Score: Two   Imaging: Dg Chest 2 View  Result Date: 09/30/2018 CLINICAL DATA:  Right pleural effusion. EXAM: CHEST - 2 VIEW COMPARISON:  09/27/2018 FINDINGS: The cardiac silhouette remains borderline enlarged. A small right pleural effusion has mildly enlarged with persistent adjacent airspace opacity and evidence of volume loss. There may be a trace left pleural effusion and minimal left basilar atelectasis no pneumothorax is identified. IMPRESSION: Mildly increased size of small right pleural effusion with right basilar atelectasis or infiltrate. Electronically Signed   By: Logan Bores M.D.   On: 09/30/2018 08:26   Dg Chest 2 View  Result Date: 09/27/2018 CLINICAL DATA:  Increasing shortness of breath EXAM: CHEST - 2 VIEW COMPARISON:  Chest x-ray dated 09/23/2018 FINDINGS: Heart size is borderline enlarged. There is no pneumothorax. The lungs are hyperexpanded. There is an airspace opacity at the right lung base with a small parapneumonic effusion. There is no acute osseous abnormality. IMPRESSION: Slight interval increase in size of the right-sided pleural effusion. There is a persistent adjacent airspace opacity which has increased slightly from prior exam. This could represent atelectasis or infiltrate. Electronically Signed   By: Constance Holster M.D.   On: 09/27/2018 22:48   Ct Soft Tissue Neck W Contrast  Result Date: 10/18/2018 CLINICAL DATA:  Enlarged lymph nodes in the chest and axilla, possible adenopathy in the left neck. Elevated IgM EXAM: CT NECK WITH CONTRAST TECHNIQUE: Multidetector CT imaging of the neck was performed using the standard protocol following the bolus administration of intravenous contrast. CONTRAST:  25m OMNIPAQUE IOHEXOL 300 MG/ML  SOLN COMPARISON:  None. FINDINGS: Pharynx and larynx: Negative for mass  or  thickening of Waldeyer's ring. Salivary glands: No inflammation, mass, or stone. Thyroid: Normal. Lymph nodes: None enlarged or abnormal density. Vascular: Atheromatous calcification. Limited intracranial: Negative Visualized orbits: Negative Mastoids and visualized paranasal sinuses: Sclerotic wall thickening of the right maxillary sinus from old inflammation. Skeleton: No acute or aggressive finding. There is advanced cervical disc and facet degeneration, especially the right-sided facets with levo scoliotic curvature. Upper chest: Reported separately Other: Negative for adenopathy or malignant IMPRESSION: 1. Benign appearance of the neck.  Negative for adenopathy. 2. Chest CT reported separately. Electronically Signed   By: Monte Fantasia M.D.   On: 10/18/2018 11:18   Ct Chest W Contrast  Result Date: 10/18/2018 CLINICAL DATA:  Lymphadenopathy. EXAM: CT CHEST, ABDOMEN, AND PELVIS WITH CONTRAST TECHNIQUE: Multidetector CT imaging of the chest, abdomen and pelvis was performed following the standard protocol during bolus administration of intravenous contrast. CONTRAST:  46m OMNIPAQUE IOHEXOL 300 MG/ML  SOLN COMPARISON:  Noncontrast chest CT 09/20/2018. Abdomen and pelvis CT without contrast 09/01/2018 FINDINGS: CT CHEST FINDINGS Cardiovascular: Heart size within normal limits. Trace pericardial effusion again noted. Coronary artery calcification is evident. Atherosclerotic calcification is noted in the wall of the thoracic aorta. Mediastinum/Nodes: Scattered mediastinal lymph nodes are within normal limits for size. Small lymph nodes are evident in both hilar regions. The esophagus has normal imaging features. There is no axillary lymphadenopathy. Lungs/Pleura: Centrilobular emphsyema noted. Right lower lobe atelectasis with small right pleural effusion is similar to prior. There is a tiny left pleural effusion, also not substantially changed in the interval. No suspicious pulmonary nodule or mass.  Musculoskeletal: No worrisome lytic or sclerotic osseous abnormality. CT ABDOMEN PELVIS FINDINGS Hepatobiliary: Tiny hypoattenuating lesion in the lateral segment left liver is stable, likely benign. There is no evidence for gallstones, gallbladder wall thickening, or pericholecystic fluid. No intrahepatic or extrahepatic biliary dilation. Pancreas: No focal mass lesion. No dilatation of the main duct. No intraparenchymal cyst. No peripancreatic edema. Spleen: No splenomegaly. No focal mass lesion. Adrenals/Urinary Tract: No adrenal nodule or mass. 6 mm stone again identified in the right renal pelvis without hydronephrosis. Tiny low-density cortical lesion in the lower pole is likely a cyst. Right ureter unremarkable. Tiny nonobstructing stones noted in the upper pole and interpolar regions of the left kidney. Left ureter unremarkable. The urinary bladder appears normal for the degree of distention. Stomach/Bowel: Stomach is unremarkable. No gastric wall thickening. No evidence of outlet obstruction. Duodenum is normally positioned as is the ligament of Treitz. No small bowel wall thickening. No small bowel dilatation. The terminal ileum is normal. The appendix is normal. No gross colonic mass. No colonic wall thickening. Vascular/Lymphatic: There is abdominal aortic atherosclerosis without aneurysm. Small retroperitoneal lymph nodes are unenlarged by CT size criteria. There is no gastrohepatic or hepatoduodenal ligament lymphadenopathy. No intraperitoneal or retroperitoneal lymphadenopathy. No pelvic sidewall lymphadenopathy. Reproductive: The uterus is unremarkable.  There is no adnexal mass. Other: No substantial intraperitoneal free fluid. Musculoskeletal: No worrisome lytic or sclerotic osseous abnormality. Degenerative changes noted in the lumbar spine. IMPRESSION: 1. No evidence for lymphadenopathy in the chest, abdomen, or pelvis. 2. Right lower lobe collapse/consolidation with small bilateral pleural  effusions, right greater than left. This is stable since prior exam. 3. Bilateral nonobstructing nephrolithiasis. 4.  Aortic Atherosclerois (ICD10-170.0) 5.  Emphysema. (([id number]) Electronically Signed   By: EMisty StanleyM.D.   On: 10/18/2018 09:48   Dg Chest Right Decubitus  Result Date: 10/01/2018 CLINICAL DATA:  Right pleural effusion. EXAM: CHEST -  RIGHT DECUBITUS COMPARISON:  09/30/2018 FINDINGS: There is a moderate-sized right pleural effusion which layers dependently. Right basilar airspace opacity was better evaluated on yesterday's standard upright radiographs. The left lung remains grossly clear. IMPRESSION: Moderate-sized free-flowing right pleural effusion. Electronically Signed   By: Logan Bores M.D.   On: 10/01/2018 12:29   Ct Abdomen Pelvis W Contrast  Result Date: 10/18/2018 CLINICAL DATA:  Lymphadenopathy. EXAM: CT CHEST, ABDOMEN, AND PELVIS WITH CONTRAST TECHNIQUE: Multidetector CT imaging of the chest, abdomen and pelvis was performed following the standard protocol during bolus administration of intravenous contrast. CONTRAST:  20m OMNIPAQUE IOHEXOL 300 MG/ML  SOLN COMPARISON:  Noncontrast chest CT 09/20/2018. Abdomen and pelvis CT without contrast 09/01/2018 FINDINGS: CT CHEST FINDINGS Cardiovascular: Heart size within normal limits. Trace pericardial effusion again noted. Coronary artery calcification is evident. Atherosclerotic calcification is noted in the wall of the thoracic aorta. Mediastinum/Nodes: Scattered mediastinal lymph nodes are within normal limits for size. Small lymph nodes are evident in both hilar regions. The esophagus has normal imaging features. There is no axillary lymphadenopathy. Lungs/Pleura: Centrilobular emphsyema noted. Right lower lobe atelectasis with small right pleural effusion is similar to prior. There is a tiny left pleural effusion, also not substantially changed in the interval. No suspicious pulmonary nodule or mass. Musculoskeletal: No  worrisome lytic or sclerotic osseous abnormality. CT ABDOMEN PELVIS FINDINGS Hepatobiliary: Tiny hypoattenuating lesion in the lateral segment left liver is stable, likely benign. There is no evidence for gallstones, gallbladder wall thickening, or pericholecystic fluid. No intrahepatic or extrahepatic biliary dilation. Pancreas: No focal mass lesion. No dilatation of the main duct. No intraparenchymal cyst. No peripancreatic edema. Spleen: No splenomegaly. No focal mass lesion. Adrenals/Urinary Tract: No adrenal nodule or mass. 6 mm stone again identified in the right renal pelvis without hydronephrosis. Tiny low-density cortical lesion in the lower pole is likely a cyst. Right ureter unremarkable. Tiny nonobstructing stones noted in the upper pole and interpolar regions of the left kidney. Left ureter unremarkable. The urinary bladder appears normal for the degree of distention. Stomach/Bowel: Stomach is unremarkable. No gastric wall thickening. No evidence of outlet obstruction. Duodenum is normally positioned as is the ligament of Treitz. No small bowel wall thickening. No small bowel dilatation. The terminal ileum is normal. The appendix is normal. No gross colonic mass. No colonic wall thickening. Vascular/Lymphatic: There is abdominal aortic atherosclerosis without aneurysm. Small retroperitoneal lymph nodes are unenlarged by CT size criteria. There is no gastrohepatic or hepatoduodenal ligament lymphadenopathy. No intraperitoneal or retroperitoneal lymphadenopathy. No pelvic sidewall lymphadenopathy. Reproductive: The uterus is unremarkable.  There is no adnexal mass. Other: No substantial intraperitoneal free fluid. Musculoskeletal: No worrisome lytic or sclerotic osseous abnormality. Degenerative changes noted in the lumbar spine. IMPRESSION: 1. No evidence for lymphadenopathy in the chest, abdomen, or pelvis. 2. Right lower lobe collapse/consolidation with small bilateral pleural effusions, right greater  than left. This is stable since prior exam. 3. Bilateral nonobstructing nephrolithiasis. 4.  Aortic Atherosclerois (ICD10-170.0) 5.  Emphysema. (([id number]) Electronically Signed   By: EMisty StanleyM.D.   On: 10/18/2018 09:48   Xr Lumbar Spine 2-3 Views  Result Date: 10/05/2018 X-rays of the lumbar spine reveal marked diffuse degenerative disc disease worse at L3-4 L4-5 and L5-S1.  [place]domen Limited Ruq  Result Date: 09/28/2018 CLINICAL DATA:  Initial evaluation for acute right upper quadrant pain. EXAM: ULTRASOUND ABDOMEN LIMITED RIGHT UPPER QUADRANT COMPARISON:  Prior CT from 09/01/2018 FINDINGS: Gallbladder: No gallstones or wall thickening visualized. No sonographic Murphy sign  noted by sonographer. Common bile duct: Diameter: 2.1 mm Liver: No focal lesion identified. Within normal limits in parenchymal echogenicity. Portal vein is patent on color Doppler imaging with normal direction of blood flow towards the liver. Incidental note made of a right pleural effusion, partially visualized. IMPRESSION: 1. Normal right upper quadrant ultrasound. 2. Incidental right pleural effusion. Electronically Signed   By: Jeannine Boga M.D.   On: 09/28/2018 00:43    Labs:  CBC: Recent Labs    10/04/18 0410 10/11/18 1418 10/24/18 1119 10/26/18 0730  WBC 3.0* 4.6 4.1 8.4  HGB 8.9* 11.2* 11.1* 9.5*  HCT 27.5* 34.1* 34.8* 30.8*  PLT 175 197 205 237    COAGS: Recent Labs    10/26/18 0730  INR 0.9    BMP: Recent Labs    10/03/18 0502 10/04/18 0410 10/11/18 1418 10/24/18 1119  NA 138 141 139 139  K 4.1 4.5 4.6 5.1  CL 107 110 108 109  CO2 _0 21*  GLUCOSE 101* 98 110* 106*  BUN 38* 35* 33* 36*  CALCIUM 8.4* 8.7* 9.7 8.6*  CREATININE 1.90* 1.75* 1.39* 1.69*  GFRNONAA 26* 29* 38* 30*  GFRAA 30* 34* 44* 35*    LIVER FUNCTION TESTS: Recent Labs    09/23/18 0437 09/27/18 2355 10/11/18 1418 10/24/18 1119  BILITOT 0.6 0.8 0.3 0.3  AST 14* 33 13* 12*  ALT 15 32 14 14   ALKPHOS 63 61 71 73  PROT 6.8 6.2* 6.1* 5.9*  ALBUMIN 3.0* 2.8* 3.4* 3.3*     Assessment and Plan:  IgM lambda monoclonal gammopathy. Plan for image-guided bone marrow biopsy/aspiration today with Dr. Earleen Newport. Patient is NPO. Afebrile and WBCs WNL. She does not take blood thinners. INR 0.9 seconds today.  Risks and benefits discussed with the patient including, but not limited to bleeding, infection, damage to adjacent structures or low yield requiring additional tests. All of the patient's questions were answered, patient is agreeable to proceed. Consent signed and in chart.   Thank you for this interesting consult.  I greatly enjoyed meeting Tiffany Leon and look forward to participating in their care.  A copy of this report was sent to the requesting provider on this date.  Electronically Signed: Earley Abide, PA-C 10/26/2018, 8:41 AM   I spent a total of 40 Minutes in face to face in clinical consultation, greater than 50% of which was counseling/coordinating care for IgM lambda monoclonal gammopathy/bone marrow biopsy and aspiration.

## 2018-10-26 NOTE — Discharge Instructions (Signed)
Moderate Conscious Sedation, Adult, Care After These instructions provide you with information about caring for yourself after your procedure. Your health care provider may also give you more specific instructions. Your treatment has been planned according to current medical practices, but problems sometimes occur. Call your health care provider if you have any problems or questions after your procedure. What can I expect after the procedure? After your procedure, it is common:  To feel sleepy for several hours.  To feel clumsy and have poor balance for several hours.  To have poor judgment for several hours.  To vomit if you eat too soon. Follow these instructions at home: For at least 24 hours after the procedure:   Do not: ? Participate in activities where you could fall or become injured. ? Drive. ? Use heavy machinery. ? Drink alcohol. ? Take sleeping pills or medicines that cause drowsiness. ? Make important decisions or sign legal documents. ? Take care of children on your own.  Rest. Eating and drinking  Follow the diet recommended by your health care provider.  If you vomit: ? Drink water, juice, or soup when you can drink without vomiting. ? Make sure you have little or no nausea before eating solid foods. General instructions  Have a responsible adult stay with you until you are awake and alert.  Take over-the-counter and prescription medicines only as told by your health care provider.  If you smoke, do not smoke without supervision.  Keep all follow-up visits as told by your health care provider. This is important. Contact a health care provider if:  You keep feeling nauseous or you keep vomiting.  You feel light-headed.  You develop a rash.  You have a fever. Get help right away if:  You have trouble breathing. This information is not intended to replace advice given to you by your health care provider. Make sure you discuss any questions you have  with your health care provider. Document Released: 01/24/2013 Document Revised: 03/18/2017 Document Reviewed: 07/26/2015 Elsevier Patient Education  2020 [employer].   Bone Marrow Aspiration and Bone Marrow Biopsy, Adult, Care After This sheet gives you information about how to care for yourself after your procedure. Your health care provider may also give you more specific instructions. If you have problems or questions, contact your health care provider. What can I expect after the procedure? After the procedure, it is common to have:  Mild pain and tenderness.  Swelling.  Bruising. Follow these instructions at home: Puncture site care      Follow instructions from your health care provider about how to take care of the puncture site. Make sure you: ? Wash your hands with soap and water before you change your bandage (dressing). If soap and water are not available, use hand sanitizer. ? Change your dressing as told by your health care provider.  You may remove your dressing tomorrow.  Check your puncture siteevery day for signs of infection. Check for: ? More redness, swelling, or pain. ? More fluid or blood. ? Warmth. ? Pus or a bad smell. General instructions  Take over-the-counter and prescription medicines only as told by your health care provider.  Do not take baths, swim, or use a hot tub until your health care provider approves. Ask if you can take a shower or have a sponge bath.  You may shower tomorrow.  Return to your normal activities as told by your health care provider. Ask your health care provider what activities are safe  for you.  Do not drive for 24 hours if you were given a medicine to help you relax (sedative) during your procedure.  Keep all follow-up visits as told by your health care provider. This is important. Contact a health care provider if:  Your pain is not controlled with medicine. Get help right away if:  You have a fever.  You have  more redness, swelling, or pain around the puncture site.  You have more fluid or blood coming from the puncture site.  Your puncture site feels warm to the touch.  You have pus or a bad smell coming from the puncture site. These symptoms may represent a serious problem that is an emergency. Do not wait to see if the symptoms will go away. Get medical help right away. Call your local emergency services (911 in the [place] Do not drive yourself to the hospital. Summary  After the procedure, it is common to have mild pain, tenderness, swelling, and bruising.  Follow instructions from your health care provider about how to take care of the puncture site.  Get help right away if you have any symptoms of infection or if you have more blood or fluid coming from the puncture site. This information is not intended to replace advice given to you by your health care provider. Make sure you discuss any questions you have with your health care provider. Document Released: 10/23/2004 Document Revised: 07/19/2017 Document Reviewed: 09/17/2015 Elsevier Patient Education  2020 [employer].

## 2018-10-26 NOTE — Procedures (Signed)
Interventional Radiology Procedure Note  Procedure: CT guided aspirate and core biopsy of right posteriro iliac bone Complications: None Recommendations: - Bedrest supine x 1 hrs - OTC's PRN  Pain - Follow biopsy results  Signed,  Dulcy Fanny. Earleen Newport, DO

## 2018-10-27 ENCOUNTER — Telehealth: Payer: Self-pay | Admitting: Physician Assistant

## 2018-10-27 ENCOUNTER — Telehealth: Payer: Self-pay | Admitting: *Deleted

## 2018-10-27 LAB — PROTEIN ELECTROPHORESIS, SERUM, WITH REFLEX
A/G Ratio: 1 (ref 0.7–1.7)
Albumin ELP: 2.8 g/dL — ABNORMAL LOW (ref 2.9–4.4)
Alpha-1-Globulin: 0.3 g/dL (ref 0.0–0.4)
Alpha-2-Globulin: 0.9 g/dL (ref 0.4–1.0)
Beta Globulin: 0.8 g/dL (ref 0.7–1.3)
Gamma Globulin: 0.9 g/dL (ref 0.4–1.8)
Globulin, Total: 2.9 g/dL (ref 2.2–3.9)
M-Spike, %: 0.7 g/dL — ABNORMAL HIGH
SPEP Interpretation: 0
Total Protein ELP: 5.7 g/dL — ABNORMAL LOW (ref 6.0–8.5)

## 2018-10-27 LAB — IMMUNOFIXATION REFLEX, SERUM
IgA: 94 mg/dL (ref 87–352)
IgG (Immunoglobin G), Serum: 197 mg/dL — ABNORMAL LOW (ref 586–1602)
IgM (Immunoglobulin M), Srm: 1649 mg/dL — ABNORMAL HIGH (ref 26–217)

## 2018-10-27 NOTE — Telephone Encounter (Signed)
Message received from patient wanting to know if BM biopsy results are back yet.  Call placed back to patient and patient notified that BM biopsy results will not be back until middle to end of next week.  Patient appreciative of call and has no further questions at this time.

## 2018-10-27 NOTE — Telephone Encounter (Signed)
Returned call to patient/husband  Today: 178/104, 169/95, 167/89, 178/88   130s-140s previously since med changes on 7/2  Patient is monitoring salt/sodium in diet. Unsure if stress and/or pain are contributing to elevated BP  Since today is isolated elevated BP readings, suggested they monitor BP twice daily (about 1-2 hours after each carvedilol dose).   Will route to MD for suggestions.

## 2018-10-27 NOTE — Telephone Encounter (Signed)
New Message           Patient blood pressure 178/104 AM 169/95 10:00/ 167/89 @1 :30 178/88 @ 2:30. Pls call to advise

## 2018-10-27 NOTE — Telephone Encounter (Signed)
Agree with recommendations, twice daily for a few more days before we consider increasing meds.  Dr. Lemmie Evens

## 2018-10-27 NOTE — Telephone Encounter (Signed)
Patient's spouse aware of MD recommendation. Agrees w/plan

## 2018-10-30 ENCOUNTER — Encounter: Payer: Medicare Other | Admitting: Physical Therapy

## 2018-10-30 ENCOUNTER — Telehealth: Payer: Self-pay | Admitting: *Deleted

## 2018-10-30 NOTE — Telephone Encounter (Signed)
Call received from patient wanting to know if BM biopsy results are back yet.  Pt instructed that results are not back at this time.  Pt appreciative of assistance.

## 2018-11-01 ENCOUNTER — Other Ambulatory Visit: Payer: Self-pay | Admitting: *Deleted

## 2018-11-01 ENCOUNTER — Encounter: Payer: Medicare Other | Admitting: Physical Therapy

## 2018-11-01 ENCOUNTER — Telehealth: Payer: Self-pay | Admitting: Internal Medicine

## 2018-11-01 MED ORDER — CARVEDILOL 25 MG PO TABS: 25.0000 mg | ORAL_TABLET | Freq: Two times a day (BID) | ORAL | 11 refills | Status: DC

## 2018-11-01 NOTE — Telephone Encounter (Signed)
Okay to increase carvedilol to 25mg  twice daily if pulse (HR) remains above 60bpm.  IF pulse already under 60bpm, please increase hydralazine to 100mg  TID instead

## 2018-11-01 NOTE — Telephone Encounter (Signed)
  Pt c/o BP issue: STAT if pt c/o blurred vision, one-sided weakness or slurred speech  1. What are your last 5 BP readings?   7/10 178/104  7/11 175/89 7/12 178/96 7/13 170/88 7/14 168/90 7/15 176/95  2. Are you having any other symptoms (ex. Dizziness, headache, blurred vision, passed out)? no  3. What is your BP issue? Patient is wondering if her medicine needs to be adjusted or changed.

## 2018-11-01 NOTE — Telephone Encounter (Signed)
Sent to MD & [facility] to review  Last call concerning BP was 7/10 and MD suggested to continue meds as prescribed for a few more days

## 2018-11-01 NOTE — Telephone Encounter (Signed)
Spoke with spouse who reports patient's HR is consistently >60bpm. Advised to increase carvedilol to 25mg  BID per [facility]. Rx(s) sent to pharmacy electronically.

## 2018-11-02 ENCOUNTER — Telehealth: Payer: Self-pay | Admitting: *Deleted

## 2018-11-02 NOTE — Telephone Encounter (Signed)
Patient states she needs a new prescription for diflucan. She states she has been taking twice a day instead of once a day. Checked with Dr Marin Olp and he states he should only be taking once daily.   Attempted to call patient back a few times for clarification without contact. Will follow up with patient tomorrow while in the office for her appointment.

## 2018-11-02 NOTE — Telephone Encounter (Signed)
Agree thanks!  Dr. H 

## 2018-11-03 ENCOUNTER — Other Ambulatory Visit: Payer: Self-pay

## 2018-11-03 ENCOUNTER — Inpatient Hospital Stay (HOSPITAL_BASED_OUTPATIENT_CLINIC_OR_DEPARTMENT_OTHER): Payer: Medicare Other | Admitting: Hematology & Oncology

## 2018-11-03 ENCOUNTER — Encounter (HOSPITAL_COMMUNITY): Payer: Self-pay | Admitting: Hematology & Oncology

## 2018-11-03 ENCOUNTER — Encounter: Payer: Self-pay | Admitting: Hematology & Oncology

## 2018-11-03 ENCOUNTER — Inpatient Hospital Stay: Payer: Medicare Other

## 2018-11-03 DIAGNOSIS — M069 Rheumatoid arthritis, unspecified: Secondary | ICD-10-CM

## 2018-11-03 DIAGNOSIS — F502 Bulimia nervosa: Secondary | ICD-10-CM

## 2018-11-03 DIAGNOSIS — C8800 Waldenstrom macroglobulinemia not having achieved remission: Secondary | ICD-10-CM

## 2018-11-03 DIAGNOSIS — D631 Anemia in chronic kidney disease: Secondary | ICD-10-CM | POA: Diagnosis not present

## 2018-11-03 DIAGNOSIS — N183 Chronic kidney disease, stage 3 unspecified: Secondary | ICD-10-CM

## 2018-11-03 DIAGNOSIS — N189 Chronic kidney disease, unspecified: Secondary | ICD-10-CM | POA: Diagnosis not present

## 2018-11-03 DIAGNOSIS — Z7189 Other specified counseling: Secondary | ICD-10-CM | POA: Insufficient documentation

## 2018-11-03 DIAGNOSIS — Z5112 Encounter for antineoplastic immunotherapy: Secondary | ICD-10-CM | POA: Diagnosis not present

## 2018-11-03 HISTORY — DX: Waldenstrom macroglobulinemia: C88.0

## 2018-11-03 HISTORY — DX: Other specified counseling: Z71.89

## 2018-11-03 HISTORY — DX: Waldenstrom macroglobulinemia not having achieved remission: C88.00

## 2018-11-03 LAB — CMP (CANCER CENTER ONLY)
ALT: 41 U/L (ref 0–44)
AST: 18 U/L (ref 15–41)
Albumin: 3.4 g/dL — ABNORMAL LOW (ref 3.5–5.0)
Alkaline Phosphatase: 61 U/L (ref 38–126)
Anion gap: 8 (ref 5–15)
BUN: 38 mg/dL — ABNORMAL HIGH (ref 8–23)
CO2: 26 mmol/L (ref 22–32)
Calcium: 9.1 mg/dL (ref 8.9–10.3)
Chloride: 108 mmol/L (ref 98–111)
Creatinine: 1.35 mg/dL — ABNORMAL HIGH (ref 0.44–1.00)
GFR, Est AFR Am: 46 mL/min — ABNORMAL LOW (ref >60)
GFR, Estimated: 40 mL/min — ABNORMAL LOW (ref >60)
Glucose, Bld: 89 mg/dL (ref 70–99)
Potassium: 5.2 mmol/L — ABNORMAL HIGH (ref 3.5–5.1)
Sodium: 142 mmol/L (ref 135–145)
Total Bilirubin: 0.3 mg/dL (ref 0.3–1.2)
Total Protein: 5.4 g/dL — ABNORMAL LOW (ref 6.5–8.1)

## 2018-11-03 LAB — CBC WITH DIFFERENTIAL (CANCER CENTER ONLY)
Abs Immature Granulocytes: 0.14 10*3/uL — ABNORMAL HIGH (ref 0.00–0.07)
Basophils Absolute: 0 10*3/uL (ref 0.0–0.1)
Basophils Relative: 0 %
Eosinophils Absolute: 0.1 10*3/uL (ref 0.0–0.5)
Eosinophils Relative: 2 %
HCT: 30.6 % — ABNORMAL LOW (ref 36.0–46.0)
Hemoglobin: 9.7 g/dL — ABNORMAL LOW (ref 12.0–15.0)
Immature Granulocytes: 2 %
Lymphocytes Relative: 33 %
Lymphs Abs: 2.1 10*3/uL (ref 0.7–4.0)
MCH: 31.1 pg (ref 26.0–34.0)
MCHC: 31.7 g/dL (ref 30.0–36.0)
MCV: 98.1 fL (ref 80.0–100.0)
Monocytes Absolute: 0.4 10*3/uL (ref 0.1–1.0)
Monocytes Relative: 7 %
Neutro Abs: 3.5 10*3/uL (ref 1.7–7.7)
Neutrophils Relative %: 56 %
Platelet Count: 207 10*3/uL (ref 150–400)
RBC: 3.12 MIL/uL — ABNORMAL LOW (ref 3.87–5.11)
RDW: 13.7 % (ref 11.5–15.5)
WBC Count: 6.3 10*3/uL (ref 4.0–10.5)
nRBC: 0 % (ref 0.0–0.2)

## 2018-11-03 MED ORDER — MEGESTROL ACETATE 400 MG/10ML PO SUSP: 400.0000 mg | Freq: Two times a day (BID) | ORAL | 4 refills | Status: DC

## 2018-11-03 NOTE — Progress Notes (Signed)
START OFF PATHWAY REGIMEN - Lymphoma and CLL   [id number]:R-CVP q21 days:   A cycle is every 21 days:     Rituximab-xxxx      Cyclophosphamide      Vincristine      Prednisone   **Always confirm dose/schedule in your pharmacy ordering system**  Patient Characteristics: Waldenstrom Macroglobulinemia/Lymphoplasmacytic Lymphoma, Symptomatic, First Line Disease Type: Waldenstrom Macroglobulinemia/Lymphoplasmacytic Lymphoma Disease Type: Not Applicable Disease Type: Not Applicable Asymptomatic or Symptomatic<= Symptomatic Line of Therapy: First Line Intent of Therapy: Non-Curative / Palliative Intent, Discussed with Patient

## 2018-11-03 NOTE — Progress Notes (Signed)
Hematology and Oncology Follow Up Visit  Breiona Couvillon [id number] 10-29-1950 68 y.o. 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 Pepitone is back for another office visit.  We find to get a bone marrow biopsy on her.  This was done on 10/26/2018.  The pathology report ([id number]) showed a normocellular bone marrow with atypical lymphoid aggregates and small mononuclear plasma cells.  There is appear to be consistent with a lymphoplasmacytic process.  As such, I suspect that you probably are looking at [facility].  The cytogenetics on the bone marrow are normal.  She comes in with her brother.  He helps around.  She does have some cognitive issues.  We thrown a lot at her today with information.  It is hard to say if this is causing all of her problems.  However, since we know that she has not, I think that we have to treat this.  We last saw her couple weeks ago, her M spike was 0.7 g/dL.  Her IgG M level was 1600 mg/dL.  Her kappa light chain was at 30 mg/dL.  She is still losing some weight.  Her appetite is not that great.  I am going to try her on some Megace elixir.  Thankfully, when we saw her last time, she has terrible macular rash.  We put her on some steroids and this helped quite a bit.  She has no rash today.  She is still a little anemic.  She has some renal insufficiency.  I am not sure if this is from chronic high blood pressure or if this might be relevant to the Waldenstrm's.  Her erythropoietin level is only 5.7 so I suspect that she probably will benefit from some Aranesp.  She has had no fever.  She has had no diarrhea.  I think that for the Waldenstrm's, I think combination of Rituxan/Cytoxan would be a good combination for her.  She will need  to have a Port-A-Cath placed.  I talked to she and her brother at length about all this.  I spent about 45 minutes with him.  I went over the bone marrow biopsy report.  I explained what Waldenstrm's was.  I explained how it is treated.  She understands that there is some that we are not going to cure because she is not a transplant candidate.  Our goals is to try to improve her quality of life so she can be more functional.  I gave them information sheets about the chemotherapy protocol.  I went over the side effects.  I think that she should do okay.  She will not lose her hair.  I do not think she will get pancytopenic.  We will make sure we have Aranesp to use if needed.  She agrees to have treatment started.  Currently, her performance status is ECOG 2.  Medications:  Current Outpatient Medications:  .  aspirin 81 MG chewable tablet, Chew 1 tablet (81 mg total) by mouth daily., Disp: , Rfl:  .  atorvastatin (LIPITOR) 40 MG tablet, Take 1 tablet (40 mg total) by mouth daily at 6 PM., Disp: 90 tablet, Rfl: 3 .  carvedilol (COREG) 25 MG tablet, Take 1 tablet (25 mg total) by mouth 2 (two) times daily with a meal.,  Disp: 60 tablet, Rfl: 11 .  clonazePAM (KLONOPIN) 0.5 MG tablet, TAKE ONE TABLET BY MOUTH TWICE DAILY, Disp: 180 tablet, Rfl: 0 .  clotrimazole (LOTRIMIN) 1 % cream, Apply topically 2 (two) times daily., Disp: 30 g, Rfl: 0 .  famotidine (PEPCID) 40 MG tablet, Take 1 tablet (40 mg total) by mouth 2 (two) times daily., Disp: 60 tablet, Rfl: 2 .  ferrous sulfate 325 (65 FE) MG tablet, Take 1 tablet (325 mg total) by mouth daily with breakfast., Disp: , Rfl: 3 .  fluconazole (DIFLUCAN) 100 MG tablet, Take 1 tablet (100 mg total) by mouth daily., Disp: 30 tablet, Rfl: 2 .  folic acid (FOLVITE) 1 MG tablet, Take 1 tablet (1 mg total) by mouth daily., Disp: , Rfl:  .  furosemide (LASIX) 40 MG tablet, Take 1 tablet (40 mg total) by mouth daily., Disp: 90 tablet, Rfl: 3 .  hydrALAZINE  (APRESOLINE) 50 MG tablet, Take 75 mg by mouth 3 (three) times daily., Disp: , Rfl:  .  isosorbide mononitrate (IMDUR) 30 MG 24 hr tablet, Take 1 tablet (30 mg total) by mouth daily., Disp: 90 tablet, Rfl: 3 .  levothyroxine (SYNTHROID) 75 MCG tablet, Take 75 mcg by mouth daily., Disp: , Rfl:  .  nicotine (NICODERM CQ - DOSED IN MG/24 HOURS) 21 mg/24hr patch, Place 1 patch (21 mg total) onto the skin daily., Disp: 28 patch, Rfl: 0 .  PARoxetine (PAXIL) 40 MG tablet, Take 60 mg by mouth every morning., Disp: , Rfl:  .  potassium chloride (K-DUR) 10 MEQ tablet, Take 1 tablet (10 mEq total) by mouth daily., Disp: 90 tablet, Rfl: 3 .  predniSONE (DELTASONE) 20 MG tablet, Take 1 tablet (20 mg total) by mouth daily with breakfast., Disp: 30 tablet, Rfl: 0 .  traZODone (DESYREL) 50 MG tablet, Take 1-2 tablets (50-100 mg total) by mouth at bedtime., Disp: 180 tablet, Rfl: 1  Allergies:  Allergies  Allergen Reactions  . Codeine Nausea And Vomiting    Past Medical History, Surgical history, Social history, and Family History were reviewed and updated.  Review of Systems: Review of Systems  Constitutional: Positive for appetite change, fatigue and unexpected weight change.  HENT:   Positive for sore throat.   Eyes: Negative.   Respiratory: Positive for shortness of breath.   Cardiovascular: Negative.   Gastrointestinal: Positive for abdominal pain and nausea.  Endocrine: Negative.   Genitourinary: Negative.    Musculoskeletal: Positive for arthralgias and myalgias.  Skin: Negative.   Neurological: Positive for light-headedness.  Hematological: Negative.   Psychiatric/Behavioral: Negative.     Physical Exam:  weight is 124 lb (56.2 kg). Her oral temperature is 97.1 F (36.2 C) (abnormal). Her blood pressure is 157/77 (abnormal) and her pulse is 73. Her respiration is 19 and oxygen saturation is 98%.   Wt Readings from Last 3 Encounters:  11/03/18 124 lb (56.2 kg)  10/24/18 129 lb 12.8 oz  (58.9 kg)  10/16/18 133 lb (60.3 kg)    Physical Exam Vitals signs reviewed.  HENT:     Head: Normocephalic and atraumatic.  Eyes:     Pupils: Pupils are equal, round, and reactive to light.  Neck:     Musculoskeletal: Normal range of motion.     Comments: On exam of her neck, there might be some adenopathy on the left neck.  This is not prominent.  However, there does seem to feel a few swollen lymph nodes.  I really cannot palpate any adenopathy on  the right neck. Cardiovascular:     Rate and Rhythm: Normal rate and regular rhythm.     Heart sounds: Normal heart sounds.  Pulmonary:     Effort: Pulmonary effort is normal.     Breath sounds: Normal breath sounds.  Abdominal:     General: Bowel sounds are normal.     Palpations: Abdomen is soft.  Musculoskeletal: Normal range of motion.        General: No tenderness or deformity.  Lymphadenopathy:     Cervical: No cervical adenopathy.  Skin:    Findings: No erythema or rash.  Neurological:     Mental Status: She is alert and oriented to person, place, and time.  Psychiatric:        Behavior: Behavior normal.        Thought Content: Thought content normal.        Judgment: Judgment normal.      Lab Results  Component Value Date   WBC 6.3 11/03/2018   HGB 9.7 (L) 11/03/2018   HCT 30.6 (L) 11/03/2018   MCV 98.1 11/03/2018   PLT 207 11/03/2018     Chemistry      Component Value Date/Time   NA 139 10/24/2018 1119   K 5.1 10/24/2018 1119   CL 109 10/24/2018 1119   CO2 21 (L) 10/24/2018 1119   BUN 36 (H) 10/24/2018 1119   CREATININE 1.69 (H) 10/24/2018 1119      Component Value Date/Time   CALCIUM 8.6 (L) 10/24/2018 1119   ALKPHOS 73 10/24/2018 1119   AST 12 (L) 10/24/2018 1119   ALT 14 10/24/2018 1119   BILITOT 0.3 10/24/2018 1119       Impression and Plan: Ms. Rummell is a 68 year old white female.  She has Waldenstrm's macroglobulinemia.  I think we will go ahead and start treating her for this.  We  will send a Port-A-Cath for her.  We will try to get this set up for next week.  Again, I really think that this will help her and make her feel better.  I suppose that she may also have rheumatoid arthritis on top of everything else.  If she does, the Rituxan and Cytoxan should help with this a little bit.  I know that this is a difficult situation.  I wish that the CT scans that were done on her would have shown that she had adenopathy but this was relatively unrevealing.  We will probably try her on 6 cycles of treatment.  I do not see need for a PET scan.  I will see need for any type of interim CT scans.  We can follow her IgM level.  We will follow her anemia and see how her quality of life improves.  Again, I spent over 45 minutes with she and her brother.  Answered all her questions.  I had to set up all of her treatment protocols and get the Port-A-Cath requested.  I answered all her questions.     [name], MD 7/17/20203:08 PM

## 2018-11-04 LAB — IGG, IGA, IGM
IgA: 70 mg/dL — ABNORMAL LOW (ref 87–352)
IgG (Immunoglobin G), Serum: 104 mg/dL — ABNORMAL LOW (ref 586–1602)
IgM (Immunoglobulin M), Srm: 1052 mg/dL — ABNORMAL HIGH (ref 26–217)

## 2018-11-06 ENCOUNTER — Encounter (HOSPITAL_COMMUNITY): Payer: Self-pay | Admitting: Hematology & Oncology

## 2018-11-06 ENCOUNTER — Other Ambulatory Visit: Payer: Self-pay | Admitting: Family

## 2018-11-06 DIAGNOSIS — D631 Anemia in chronic kidney disease: Secondary | ICD-10-CM | POA: Insufficient documentation

## 2018-11-06 LAB — KAPPA/LAMBDA LIGHT CHAINS
Kappa free light chain: 130.7 mg/L — ABNORMAL HIGH (ref 3.3–19.4)
Kappa, lambda light chain ratio: 6.31 — ABNORMAL HIGH (ref 0.26–1.65)
Lambda free light chains: 20.7 mg/L (ref 5.7–26.3)

## 2018-11-07 ENCOUNTER — Encounter (HOSPITAL_COMMUNITY): Payer: Self-pay | Admitting: Hematology & Oncology

## 2018-11-07 ENCOUNTER — Encounter: Payer: Medicare Other | Admitting: Physical Therapy

## 2018-11-07 ENCOUNTER — Other Ambulatory Visit: Payer: Self-pay

## 2018-11-07 ENCOUNTER — Other Ambulatory Visit: Payer: Self-pay | Admitting: *Deleted

## 2018-11-07 ENCOUNTER — Inpatient Hospital Stay: Payer: Medicare Other

## 2018-11-07 ENCOUNTER — Encounter: Payer: Self-pay | Admitting: *Deleted

## 2018-11-07 DIAGNOSIS — C880 Waldenstrom macroglobulinemia: Secondary | ICD-10-CM

## 2018-11-07 LAB — IMMUNOFIXATION REFLEX, SERUM
IgA: 70 mg/dL — ABNORMAL LOW (ref 87–352)
IgG (Immunoglobin G), Serum: 40 mg/dL — ABNORMAL LOW (ref 586–1602)
IgM (Immunoglobulin M), Srm: 863 mg/dL — ABNORMAL HIGH (ref 26–217)

## 2018-11-07 LAB — PROTEIN ELECTROPHORESIS, SERUM, WITH REFLEX
A/G Ratio: 1.2 (ref 0.7–1.7)
Albumin ELP: 2.8 g/dL — ABNORMAL LOW (ref 2.9–4.4)
Alpha-1-Globulin: 0.3 g/dL (ref 0.0–0.4)
Alpha-2-Globulin: 0.9 g/dL (ref 0.4–1.0)
Beta Globulin: 0.7 g/dL (ref 0.7–1.3)
Gamma Globulin: 0.5 g/dL (ref 0.4–1.8)
Globulin, Total: 2.4 g/dL (ref 2.2–3.9)
M-Spike, %: 0.3 g/dL — ABNORMAL HIGH
SPEP Interpretation: 0
Total Protein ELP: 5.2 g/dL — ABNORMAL LOW (ref 6.0–8.5)

## 2018-11-07 MED ORDER — PROCHLORPERAZINE MALEATE 10 MG PO TABS: 10.0000 mg | ORAL_TABLET | Freq: Four times a day (QID) | ORAL | 1 refills | Status: DC | PRN

## 2018-11-07 MED ORDER — LIDOCAINE-PRILOCAINE 2.5-2.5 % EX CREA: TOPICAL_CREAM | CUTANEOUS | 3 refills | Status: DC

## 2018-11-07 MED ORDER — ONDANSETRON HCL 8 MG PO TABS: 8.0000 mg | ORAL_TABLET | Freq: Two times a day (BID) | ORAL | 1 refills | Status: DC | PRN

## 2018-11-07 NOTE — Progress Notes (Unsigned)
Patient in chemotherapy education class with brother Eddie Dibbles.  Discussed side effects of  Rituxan and Cytoxan  which include but are not limited to myelosuppression, decreased appetite, fatigue, fever, allergic or infusional reaction, mucositis, cardiac toxicity, cough, SOB, altered taste, nausea and vomiting, diarrhea, constipation, elevated LFTs myalgia and arthralgias, hair loss or thinning, rash, skin dryness, nail changes, peripheral neuropathy, discolored urine, delayed wound healing, mental changes (Chemo brain), increased risk of infections, weight loss.  Reviewed infusion room and office policy and procedure and phone numbers 24 hours x 7 days a week.  Reviewed when to call the office with any concerns or problems.  Scientist, clinical (histocompatibility and immunogenetics) given.  Discussed portacath insertion and EMLA cream administration.  Antiemetic protocol and chemotherapy schedule reviewed. Patient verbalized understanding of chemotherapy indications and possible side effects.  Teachback done

## 2018-11-08 ENCOUNTER — Other Ambulatory Visit: Payer: Self-pay | Admitting: Radiology

## 2018-11-09 ENCOUNTER — Ambulatory Visit (HOSPITAL_COMMUNITY)
Admission: RE | Admit: 2018-11-09 | Discharge: 2018-11-09 | Disposition: A | Discharge status: Home / Self Care | Payer: Medicare Other | Source: Ambulatory Visit | Attending: Hematology & Oncology | Admitting: Hematology & Oncology

## 2018-11-09 ENCOUNTER — Other Ambulatory Visit: Payer: Self-pay

## 2018-11-09 ENCOUNTER — Encounter (HOSPITAL_COMMUNITY): Payer: Self-pay

## 2018-11-09 ENCOUNTER — Other Ambulatory Visit: Payer: Self-pay | Admitting: Hematology & Oncology

## 2018-11-09 ENCOUNTER — Encounter: Payer: Medicare Other | Admitting: Physical Therapy

## 2018-11-09 DIAGNOSIS — C880 Waldenstrom macroglobulinemia: Secondary | ICD-10-CM

## 2018-11-09 DIAGNOSIS — Z79890 Hormone replacement therapy (postmenopausal): Secondary | ICD-10-CM | POA: Diagnosis not present

## 2018-11-09 DIAGNOSIS — J439 Emphysema, unspecified: Secondary | ICD-10-CM | POA: Diagnosis not present

## 2018-11-09 DIAGNOSIS — Z801 Family history of malignant neoplasm of trachea, bronchus and lung: Secondary | ICD-10-CM | POA: Insufficient documentation

## 2018-11-09 DIAGNOSIS — I5042 Chronic combined systolic (congestive) and diastolic (congestive) heart failure: Secondary | ICD-10-CM | POA: Insufficient documentation

## 2018-11-09 DIAGNOSIS — Z8249 Family history of ischemic heart disease and other diseases of the circulatory system: Secondary | ICD-10-CM | POA: Insufficient documentation

## 2018-11-09 DIAGNOSIS — K219 Gastro-esophageal reflux disease without esophagitis: Secondary | ICD-10-CM | POA: Diagnosis not present

## 2018-11-09 DIAGNOSIS — N179 Acute kidney failure, unspecified: Secondary | ICD-10-CM | POA: Diagnosis not present

## 2018-11-09 DIAGNOSIS — F17210 Nicotine dependence, cigarettes, uncomplicated: Secondary | ICD-10-CM | POA: Insufficient documentation

## 2018-11-09 DIAGNOSIS — E039 Hypothyroidism, unspecified: Secondary | ICD-10-CM | POA: Insufficient documentation

## 2018-11-09 DIAGNOSIS — Z7982 Long term (current) use of aspirin: Secondary | ICD-10-CM | POA: Insufficient documentation

## 2018-11-09 DIAGNOSIS — Z79899 Other long term (current) drug therapy: Secondary | ICD-10-CM | POA: Diagnosis not present

## 2018-11-09 DIAGNOSIS — Z885 Allergy status to narcotic agent status: Secondary | ICD-10-CM | POA: Insufficient documentation

## 2018-11-09 DIAGNOSIS — Z823 Family history of stroke: Secondary | ICD-10-CM | POA: Diagnosis not present

## 2018-11-09 HISTORY — PX: IR IMAGING GUIDED PORT INSERTION: IMG5740

## 2018-11-09 LAB — BASIC METABOLIC PANEL
Anion gap: 12 (ref 5–15)
BUN: 40 mg/dL — ABNORMAL HIGH (ref 8–23)
CO2: 22 mmol/L (ref 22–32)
Calcium: 8.6 mg/dL — ABNORMAL LOW (ref 8.9–10.3)
Chloride: 108 mmol/L (ref 98–111)
Creatinine, Ser: 1.74 mg/dL — ABNORMAL HIGH (ref 0.44–1.00)
GFR calc Af Amer: 34 mL/min — ABNORMAL LOW (ref >60)
GFR calc non Af Amer: 29 mL/min — ABNORMAL LOW (ref >60)
Glucose, Bld: 97 mg/dL (ref 70–99)
Potassium: 4.4 mmol/L (ref 3.5–5.1)
Sodium: 142 mmol/L (ref 135–145)

## 2018-11-09 LAB — CBC WITH DIFFERENTIAL/PLATELET
Abs Immature Granulocytes: 0.1 10*3/uL — ABNORMAL HIGH (ref 0.00–0.07)
Basophils Absolute: 0 10*3/uL (ref 0.0–0.1)
Basophils Relative: 1 %
Eosinophils Absolute: 0 10*3/uL (ref 0.0–0.5)
Eosinophils Relative: 1 %
HCT: 30.9 % — ABNORMAL LOW (ref 36.0–46.0)
Hemoglobin: 9.5 g/dL — ABNORMAL LOW (ref 12.0–15.0)
Immature Granulocytes: 2 %
Lymphocytes Relative: 29 %
Lymphs Abs: 1.9 10*3/uL (ref 0.7–4.0)
MCH: 31.4 pg (ref 26.0–34.0)
MCHC: 30.7 g/dL (ref 30.0–36.0)
MCV: 102 fL — ABNORMAL HIGH (ref 80.0–100.0)
Monocytes Absolute: 0.5 10*3/uL (ref 0.1–1.0)
Monocytes Relative: 7 %
Neutro Abs: 4 10*3/uL (ref 1.7–7.7)
Neutrophils Relative %: 60 %
Platelets: 155 10*3/uL (ref 150–400)
RBC: 3.03 MIL/uL — ABNORMAL LOW (ref 3.87–5.11)
RDW: 13.7 % (ref 11.5–15.5)
WBC: 6.6 10*3/uL (ref 4.0–10.5)
nRBC: 0 % (ref 0.0–0.2)

## 2018-11-09 LAB — PROTIME-INR
INR: 1 (ref 0.8–1.2)
Prothrombin Time: 12.9 seconds (ref 11.4–15.2)

## 2018-11-09 MED ORDER — SODIUM CHLORIDE 0.9 % IV SOLN
INTRAVENOUS | Status: DC
  Administered 2018-11-09: 10:00:00 via INTRAVENOUS

## 2018-11-09 MED ORDER — FENTANYL CITRATE (PF) 100 MCG/2ML IJ SOLN
INTRAMUSCULAR | Status: AC | PRN
  Administered 2018-11-09: 25 ug via INTRAVENOUS
  Administered 2018-11-09: 50 ug via INTRAVENOUS

## 2018-11-09 MED ORDER — MIDAZOLAM HCL 2 MG/2ML IJ SOLN
INTRAMUSCULAR | Status: AC
  Filled 2018-11-09: qty 2

## 2018-11-09 MED ORDER — FENTANYL CITRATE (PF) 100 MCG/2ML IJ SOLN
INTRAMUSCULAR | Status: AC
  Filled 2018-11-09: qty 2

## 2018-11-09 MED ORDER — LIDOCAINE-EPINEPHRINE (PF) 2 %-1:200000 IJ SOLN
INTRAMUSCULAR | Status: AC | PRN
  Administered 2018-11-09 (×2): 10 mL

## 2018-11-09 MED ORDER — MIDAZOLAM HCL 2 MG/2ML IJ SOLN
INTRAMUSCULAR | Status: AC | PRN
  Administered 2018-11-09 (×2): 0.5 mg via INTRAVENOUS
  Administered 2018-11-09: 1 mg via INTRAVENOUS

## 2018-11-09 MED ORDER — HEPARIN SOD (PORK) LOCK FLUSH 100 UNIT/ML IV SOLN
INTRAVENOUS | Status: AC | PRN
  Administered 2018-11-09: 500 [IU] via INTRAVENOUS

## 2018-11-09 MED ORDER — LIDOCAINE-EPINEPHRINE (PF) 2 %-1:200000 IJ SOLN
INTRAMUSCULAR | Status: AC
  Filled 2018-11-09: qty 20

## 2018-11-09 MED ORDER — CEFAZOLIN SODIUM-DEXTROSE 2-4 GM/100ML-% IV SOLN
2.0000 g | INTRAVENOUS | Status: AC
  Administered 2018-11-09: 2 g via INTRAVENOUS

## 2018-11-09 MED ORDER — HEPARIN SOD (PORK) LOCK FLUSH 100 UNIT/ML IV SOLN
INTRAVENOUS | Status: AC
  Filled 2018-11-09: qty 5

## 2018-11-09 MED ORDER — CEFAZOLIN SODIUM-DEXTROSE 2-4 GM/100ML-% IV SOLN
INTRAVENOUS | Status: AC
  Administered 2018-11-09: 2 g via INTRAVENOUS
  Filled 2018-11-09: qty 100

## 2018-11-09 NOTE — Discharge Instructions (Signed)
Implanted Port Insertion, Care After °This sheet gives you information about how to care for yourself after your procedure. Your health care provider may also give you more specific instructions. If you have problems or questions, contact your health care provider. °What can I expect after the procedure? °After the procedure, it is common to have: °· Discomfort at the port insertion site. °· Bruising on the skin over the port. This should improve over 3-4 days. °Follow these instructions at home: °Port care °· After your port is placed, you will get a manufacturer's information card. The card has information about your port. Keep this card with you at all times. °· Take care of the port as told by your health care provider. Ask your health care provider if you or a family member can get training for taking care of the port at home. A home health care nurse may also take care of the port. °· Make sure to remember what type of port you have. °Incision care ° °  ° °· Follow instructions from your health care provider about how to take care of your port insertion site. Make sure you: °? Wash your hands with soap and water before and after you change your bandage (dressing). If soap and water are not available, use hand sanitizer. °? Change your dressing as told by your health care provider. °? Leave stitches (sutures), skin glue, or adhesive strips in place. These skin closures may need to stay in place for 2 weeks or longer. If adhesive strip edges start to loosen and curl up, you may trim the loose edges. Do not remove adhesive strips completely unless your health care provider tells you to do that. °· Check your port insertion site every day for signs of infection. Check for: °? Redness, swelling, or pain. °? Fluid or blood. °? Warmth. °? Pus or a bad smell. °Activity °· Return to your normal activities as told by your health care provider. Ask your health care provider what activities are safe for you. °· Do not  lift anything that is heavier than 10 lb (4.5 kg), or the limit that you are told, until your health care provider says that it is safe. °General instructions °· Take over-the-counter and prescription medicines only as told by your health care provider. °· Do not take baths, swim, or use a hot tub until your health care provider approves. Ask your health care provider if you may take showers. You may only be allowed to take sponge baths. °· Do not drive for 24 hours if you were given a sedative during your procedure. °· Wear a medical alert bracelet in case of an emergency. This will tell any health care providers that you have a port. °· Keep all follow-up visits as told by your health care provider. This is important. °Contact a health care provider if: °· You cannot flush your port with saline as directed, or you cannot draw blood from the port. °· You have a fever or chills. °· You have redness, swelling, or pain around your port insertion site. °· You have fluid or blood coming from your port insertion site. °· Your port insertion site feels warm to the touch. °· You have pus or a bad smell coming from the port insertion site. °Get help right away if: °· You have chest pain or shortness of breath. °· You have bleeding from your port that you cannot control. °Summary °· Take care of the port as told by your health   care provider. Keep the manufacturer's information card with you at all times. °· Change your dressing as told by your health care provider. °· Contact a health care provider if you have a fever or chills or if you have redness, swelling, or pain around your port insertion site. °· Keep all follow-up visits as told by your health care provider. °This information is not intended to replace advice given to you by your health care provider. Make sure you discuss any questions you have with your health care provider. °Document Released: 01/24/2013 Document Revised: 11/01/2017 Document Reviewed:  11/01/2017 °Elsevier Patient Education © 2020 [employer] ° °Moderate Conscious Sedation, Adult, Care After °These instructions provide you with information about caring for yourself after your procedure. Your health care provider may also give you more specific instructions. Your treatment has been planned according to current medical practices, but problems sometimes occur. Call your health care provider if you have any problems or questions after your procedure. °What can I expect after the procedure? °After your procedure, it is common: °· To feel sleepy for several hours. °· To feel clumsy and have poor balance for several hours. °· To have poor judgment for several hours. °· To vomit if you eat too soon. °Follow these instructions at home: °For at least 24 hours after the procedure: ° °· Do not: °? Participate in activities where you could fall or become injured. °? Drive. °? Use heavy machinery. °? Drink alcohol. °? Take sleeping pills or medicines that cause drowsiness. °? Make important decisions or sign legal documents. °? Take care of children on your own. °· Rest. °Eating and drinking °· Follow the diet recommended by your health care provider. °· If you vomit: °? Drink water, juice, or soup when you can drink without vomiting. °? Make sure you have little or no nausea before eating solid foods. °General instructions °· Have a responsible adult stay with you until you are awake and alert. °· Take over-the-counter and prescription medicines only as told by your health care provider. °· If you smoke, do not smoke without supervision. °· Keep all follow-up visits as told by your health care provider. This is important. °Contact a health care provider if: °· You keep feeling nauseous or you keep vomiting. °· You feel light-headed. °· You develop a rash. °· You have a fever. °Get help right away if: °· You have trouble breathing. °This information is not intended to replace advice given to you by your  health care provider. Make sure you discuss any questions you have with your health care provider. °Document Released: 01/24/2013 Document Revised: 03/18/2017 Document Reviewed: 07/26/2015 °Elsevier Patient Education © 2020 [employer] Inc. ° °

## 2018-11-09 NOTE — Progress Notes (Signed)
Cardiology Office Note:    Date:  11/15/2018   ID:  Tiffany Leon, DOB [birth year], MRN [id number]  PCP:  Aretta Nip, MD  Cardiologist:  Pixie Casino, MD   Referring MD: Aretta Nip, MD   Chief Complaint  Patient presents with   Hypertension    History of Present Illness:    Tiffany Leon is a 68 y.o. female with a history of smoking, emphysema, GERD, depression, hypothyroidism, and anxiety. She had no cardiac history prior to a recent hospitalization. In March, she began to have daily N/V/diarrhea, found to have AKI and was referred to nephrology and GI. She had significant fatigue   She presented to the ER after 2-3 weeks of progressively worsening dyspnea, lower extremity swelling, and new orthopnea. COVID negative. CT with moderate right pleural effusion. Echocardiogram with EF of 40-45%, diffuse hypokinesis, and trivial-small pericardial effusion.  She was mildly anemic with hemoglobin 9.0, and sed rate was elevated at 107. She underwent right thoracentesis with removal of 900cc transudative effusion.  She was seen by our service in consultation for heart failure on 09/20/18. In addition to a 84-month hx of N/V/D, she also had 2-4 week hx of dyspnea and LE edema. She reported a 1-year history of exertional chest tightness. She was diuresed. She was hypertensive in the 150-180s, Medical therapy was optimized and she was discharged on 09/24/18. Of note, she was referred to rheumatology for elevated rheumatoid factor. Unfortunately, she presented back to the ER on 09/27/18 with abdominal pain, emesis, and chest pain. She was admitted with acute CHF exacerbation. She had not been compliant on a low salt diet. CT abd/pelvis was unremarkable. She was again optimized and discharged on 10/04/18. In the interim, she has been battling uncontrolled BP. She last saw Dr. Debara Pickett on 10/16/18 - this was following a telehealth visit with me in which she had multiple ongoing health problems, including  chest pain prompting me to recommend in-person visit. Dr. Debara Pickett adjusted anti-hypertensives and noted pending heme/onc labs. An ischemic evaluation was deferred at that time given her other ongoing problems. Since then, she has been diagnosed with Waldenstrom's macroglobulinemia in the setting of anemia, chronic renal insufficiency, and probably RA. She was started on chemotherapy with a palliative intent. She received a port-a-cath on 11/09/18.   She returns today for follow up. She is here with her brother. Her pressures were elevated and coreg was increased to 25 mg BID through phone calls prior to this visit. Today, she reports overall doing well. Her breathing is at baseline and she tolerated her first chemotherapy treatment well. She continues to not eat well. In conversation, I learned she did have a problem with anorexia when she was younger. We talked about lean proteins that she can eat. Improving her protein/albumin will improve her swelling. She expressed understanding. Dr. Marin Olp has advised them to hold off on seeing a nephrologist for now. Her main concern today is her blood pressure. They bring an extensive BP log which reveals systolic pressures consistently in the 140-170s range. She denies headache and vision problems. We discussed that controlling her pressure will also help her renal function.    Past Medical History:  Diagnosis Date   Acute kidney injury ([place])    a. 09/2018   Anxiety    Aortic atherosclerosis ([place])    a. 09/2018 noted on Chest CT.   Chronic combined systolic (congestive) and diastolic (congestive) heart failure ([place])    a. 09/2018 Echo: EF 45-50%, diff  HK. Triv to small circumfirential pericardial eff.   Chronic DOE (dyspnea on exertion)    Claudication ([place])    a. Bilat hip claudication since ~ 2019.   Depression    Dyspnea    Emphysema lung ([place])    Exertional angina ([place])    a. Ex angina since ~ 2019.   GERD (gastroesophageal reflux disease)      Goals of care, counseling/discussion 11/03/2018   Hiatal hernia    History of bronchitis    History of kidney stones    Hypothyroidism    Pleural effusion    a. 09/2018 s/p thoracentesis-->920ml.   Pleural effusion 09/2018   Rash 10/2018   Rash began Saturday with unknown reason.  MD seen Monday 10/23/18   Resting tremor    Tobacco abuse    Waldenstrom's macroglobulinemia ([place]) 11/03/2018    Past Surgical History:  Procedure Laterality Date   BREAST LUMPECTOMY WITH RADIOACTIVE SEED LOCALIZATION Left 12/28/2017   Procedure: BREAST LUMPECTOMY WITH RADIOACTIVE SEED LOCALIZATION;  Surgeon: Jovita Kussmaul, MD;  Location: [facility];  Service: General;  Laterality: Left;   DG THUMB RIGHT HAND ([facility] HX)     IR IMAGING GUIDED PORT INSERTION  11/09/2018   IR THORACENTESIS ASP PLEURAL SPACE W/IMG GUIDE  09/20/2018   KIDNEY STONE SURGERY      Current Medications: Current Meds  Medication Sig   aspirin 81 MG chewable tablet Chew 1 tablet (81 mg total) by mouth daily.   atorvastatin (LIPITOR) 40 MG tablet Take 1 tablet (40 mg total) by mouth daily at 6 PM.   carvedilol (COREG) 25 MG tablet Take 1 tablet (25 mg total) by mouth 2 (two) times daily with a meal.   clonazePAM (KLONOPIN) 0.5 MG tablet TAKE ONE TABLET BY MOUTH TWICE DAILY   clotrimazole (LOTRIMIN) 1 % cream Apply topically 2 (two) times daily.   famotidine (PEPCID) 40 MG tablet Take 1 tablet (40 mg total) by mouth 2 (two) times daily.   ferrous sulfate 325 (65 FE) MG tablet Take 1 tablet (325 mg total) by mouth daily with breakfast.   fluconazole (DIFLUCAN) 100 MG tablet Take 1 tablet (100 mg total) by mouth daily.   folic acid (FOLVITE) 1 MG tablet Take 1 tablet (1 mg total) by mouth daily.   furosemide (LASIX) 40 MG tablet Take 1 tablet (40 mg total) by mouth daily.   hydrALAZINE (APRESOLINE) 50 MG tablet Take 2 tablets (100 mg total) by mouth 3 (three) times daily.   isosorbide mononitrate (IMDUR) 30 MG 24 hr  tablet Take 1 tablet (30 mg total) by mouth daily.   levothyroxine (SYNTHROID) 75 MCG tablet Take 75 mcg by mouth daily.   lidocaine-prilocaine (EMLA) cream Apply to affected area once   megestrol (MEGACE) 400 MG/10ML suspension Take 10 mLs (400 mg total) by mouth 2 (two) times daily.   nicotine (NICODERM CQ - DOSED IN MG/24 HOURS) 21 mg/24hr patch Place 1 patch (21 mg total) onto the skin daily.   ondansetron (ZOFRAN) 8 MG tablet Take 1 tablet (8 mg total) by mouth 2 (two) times daily as needed for refractory nausea / vomiting. Start on day 3 after chemotherapy.   PARoxetine (PAXIL) 40 MG tablet Take 60 mg by mouth every morning.   potassium chloride (K-DUR) 10 MEQ tablet Take 1 tablet (10 mEq total) by mouth daily.   predniSONE (DELTASONE) 20 MG tablet Take 1 tablet (20 mg total) by mouth daily with breakfast.   prochlorperazine (COMPAZINE) 10 MG tablet  Take 1 tablet (10 mg total) by mouth every 6 (six) hours as needed (Nausea or vomiting).   traZODone (DESYREL) 50 MG tablet Take 1-2 tablets (50-100 mg total) by mouth at bedtime.   [DISCONTINUED] atorvastatin (LIPITOR) 40 MG tablet Take 1 tablet (40 mg total) by mouth daily at 6 PM.   [DISCONTINUED] carvedilol (COREG) 25 MG tablet Take 1 tablet (25 mg total) by mouth 2 (two) times daily with a meal.   [DISCONTINUED] hydrALAZINE (APRESOLINE) 50 MG tablet Take 75 mg by mouth 3 (three) times daily.     Allergies:   Codeine   Social History   Socioeconomic History   Marital status: Married    Spouse name: Not on file   Number of children: Not on file   Years of education: Not on file   Highest education level: Not on file  Occupational History   Not on file  Social Needs   Financial resource strain: Not on file   Food insecurity    Worry: Not on file    Inability: Not on file   Transportation needs    Medical: Not on file    Non-medical: Not on file  Tobacco Use   Smoking status: Current Every Day Smoker     Packs/day: 0.25    Years: 40.00    Pack years: 10.00    Types: Cigarettes   Smokeless tobacco: Never Used   Tobacco comment: smoking 3-4 cigarettes/day  Substance and Sexual Activity   Alcohol use: Not Currently    Comment: 1-2 gl wine / night - none since 07/2018   Drug use: Not Currently    Comment: prev used drugs ~ 35 yrs ago.   Sexual activity: Not on file  Lifestyle   Physical activity    Days per week: Not on file    Minutes per session: Not on file   Stress: Not on file  Relationships   Social connections    Talks on phone: Not on file    Gets together: Not on file    Attends religious service: Not on file    Active member of club or organization: Not on file    Attends meetings of clubs or organizations: Not on file    Relationship status: Not on file  Other Topics Concern   Not on file  Social History Narrative   Lives in [place] with her husband.  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     Family History: The patient's family history includes Addison's disease in her sister; CAD in her brother; Dementia in her mother; Hypertension in her brother and brother; Lung cancer in her father; Stroke in her mother.  ROS:   Please see the history of present illness.     All other systems reviewed and are negative.  EKGs/Labs/Other Studies Reviewed:    The following studies were reviewed today:  Echo 09/20/18:  1. 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 The cavity size was normal. Left ventricular diastolic function could not be evaluated secondary to atrial fibrillation. Elevated left ventricular  end-diastolic pressure Left ventricular diffuse hypokinesis.  2. The right ventricle has normal systolic function. The cavity was normal. There is no increase in right ventricular wall thickness. Right ventricular systolic pressure could not be assessed  3. Trivial to small circumferential pericardial effusion  is present with prominent fat pad over the RV.  4. The aortic valve was not well visualized. Aortic valve regurgitation was  not assessed by color flow Doppler.  5. The inferior vena cava was normal in size with <50% respiratory variability.  6. The interatrial septum appears to be lipomatous.   EKG:  EKG is not ordered today.    Recent Labs: 09/20/2018: TSH 5.145 09/28/2018: B Natriuretic Peptide 224.7 11/03/2018: ALT 41 11/09/2018: BUN 40; Creatinine, Ser 1.74; Hemoglobin 9.5; Platelets 155; Potassium 4.4; Sodium 142  Recent Lipid Panel    Component Value Date/Time   CHOL 121 09/28/2018 0328   TRIG 102 09/28/2018 0328   HDL 29 (L) 09/28/2018 0328   CHOLHDL 4.2 09/28/2018 0328   VLDL 20 09/28/2018 0328   LDLCALC 72 09/28/2018 0328    Physical Exam:    VS:  BP (!) 173/96    Pulse 99    Ht 5\' 6"  (1.676 m)    Wt 128 lb (58.1 kg)    BMI 20.66 kg/m     Wt Readings from Last 3 Encounters:  11/15/18 128 lb (58.1 kg)  11/03/18 124 lb (56.2 kg)  10/24/18 129 lb 12.8 oz (58.9 kg)     GEN: frail elderly female  in no acute distress HEENT: Normal NECK: No JVD LYMPHATICS: No lymphadenopathy CARDIAC: RRR, no murmurs, rubs, gallops, right sided port-a-cath RESPIRATORY:  Clear to auscultation without rales, wheezing or rhonchi  ABDOMEN: Soft, non-tender, non-distended MUSCULOSKELETAL:  1+ edema; No deformity  SKIN: Warm and dry NEUROLOGIC:  Alert and oriented x 3 PSYCHIATRIC:  Normal affect   ASSESSMENT:    1. Chronic systolic CHF (congestive heart failure) ([place])   2. Hyperlipidemia, unspecified hyperlipidemia type   3. CKD (chronic kidney disease), stage III ([place])   4. Anorexia    PLAN:    In order of problems listed above:  Chronic systolic heart failure Echo with EF of 45-50 %. Maintained on hydralazine and imdur (bidil too expensive).  Her breathing is at baseline. She takes 40 mg lasix daily. Coreg increased to 25 mg BID. She does have lower extremity swelling today. I've  discussed increasing protein and compression socks.    Hypertension Continues to be hypertensive with systolic BP consistently [id number]. I will first increase hydralazine from 75 mg to 100 mg TID. After one week, will increase imdur to 30 mg BID. We discussed avoiding ACEI/ARBS given her renal function. Since her EF is borderline low, may consider adding low dose norvasc if the above does not improve her pressure. May also consider renal artery duplex.    Coronary artery calcification Per Dr. Debara Pickett, will defer ischemic evaluation at this time given her overall clinical picture.    Waldenstrom's macroglobulinemia  Port-a-cath placed. Chemotherapy with palliative goal, not curative. She is not a candidate for transplant. Follows with Dr. Marin Olp.     CKD stage III sCr 1.74 on 11/09/18. Baseline appears to be 1.39. Dr. Marin Olp told them to hold off on nephrology consult for now. May need nephrology if we have to titrate diuretics.   She has complex medical problems ongoing and I think she is doing well, considering. She is working hard with PT - sometimes three times per day. We discussed when to call the office - BP, orthopnea, increased swelling.    Medication Adjustments/Labs and Tests Ordered: Current medicines are reviewed at length with the patient today.  Concerns regarding medicines are outlined above.  No orders of the defined types were placed in this encounter.  Meds ordered this encounter  Medications   atorvastatin (LIPITOR) 40 MG tablet    Sig: Take  1 tablet (40 mg total) by mouth daily at 6 PM.    Dispense:  90 tablet    Refill:  3   carvedilol (COREG) 25 MG tablet    Sig: Take 1 tablet (25 mg total) by mouth 2 (two) times daily with a meal.    Dispense:  60 tablet    Refill:  11    Increased dose 11/01/18   hydrALAZINE (APRESOLINE) 50 MG tablet    Sig: Take 2 tablets (100 mg total) by mouth 3 (three) times daily.    Dispense:  180 tablet    Refill:  1     Signed, Ledora Bottcher, [place]  11/15/2018 11:12 AM    [place]

## 2018-11-09 NOTE — H&P (Signed)
Chief Complaint: Patient was seen in consultation today for waldenstroms macroglobulinemia/Port-a-cath placement.  Referring Physician(s): Ennever,Peter R  Supervising Physician: Jacqulynn Cadet  Patient Status: [facility] - Out-pt  History of Present Illness: Tiffany Leon is a 68 y.o. female with a past medical history of HF, emphysema, bronchitis, GERD, hiatal hernia, AKI, nephrolithiasis, waldenstroms macroglobulinemia, hypoparathyroidism, anxiety, depression, and tobacco abuse. She was unfortunately diagnosed with waldenstroms macroglobulinemia earlier this month. Her cancer is managed by Dr. Marin Olp. She has tentative plans to begin systemic chemotherapy.  IR consulted by Dr. Marin Olp for possible image-guided Port-a-cath placement. Patient awake and alert laying in bed. Complains of dyspnea- states she has COPD and this is normal for her. Denies fever, chills, chest pain, abdominal pain, or headache.   Past Medical History:  Diagnosis Date  . Acute kidney injury ([place])    a. 09/2018  . Anxiety   . Aortic atherosclerosis ([place])    a. 09/2018 noted on Chest CT.  Marland Kitchen Chronic combined systolic (congestive) and diastolic (congestive) heart failure ([place])    a. 09/2018 Echo: EF 45-50%, diff HK. Triv to small circumfirential pericardial eff.  . Chronic DOE (dyspnea on exertion)   . Claudication [facility])    a. Bilat hip claudication since ~ 2019.  Marland Kitchen Depression   . Dyspnea   . Emphysema lung ([place])   . Exertional angina ([facility])    a. Ex angina since ~ 2019.  Marland Kitchen GERD (gastroesophageal reflux disease)   . Goals of care, counseling/discussion 11/03/2018  . Hiatal hernia   . History of bronchitis   . History of kidney stones   . Hypothyroidism   . Pleural effusion    a. 09/2018 s/p thoracentesis-->956m.  . Pleural effusion 09/2018  . Rash 10/2018   Rash began Saturday with unknown reason.  MD seen Monday 10/23/18  . Resting tremor   . Tobacco abuse   . Waldenstrom's macroglobulinemia ([facility]  11/03/2018    Past Surgical History:  Procedure Laterality Date  . BREAST LUMPECTOMY WITH RADIOACTIVE SEED LOCALIZATION Left 12/28/2017   Procedure: BREAST LUMPECTOMY WITH RADIOACTIVE SEED LOCALIZATION;  Surgeon: TJovita Kussmaul MD;  [facility]  Service: General;  Laterality: Left;  . DG THUMB RIGHT HAND ([place])    . IR THORACENTESIS ASP PLEURAL SPACE W/IMG GUIDE  09/20/2018  . KIDNEY STONE SURGERY      Allergies: Codeine  Medications: Prior to Admission medications   Medication Sig Start Date End Date Taking? Authorizing Provider  aspirin 81 MG chewable tablet Chew 1 tablet (81 mg total) by mouth daily. 09/25/18   MGeorgette Shell MD  atorvastatin (LIPITOR) 40 MG tablet Take 1 tablet (40 mg total) by mouth daily at 6 PM. 10/13/18   Duke, ATami Lin PA  carvedilol (COREG) 25 MG tablet Take 1 tablet (25 mg total) by mouth 2 (two) times daily with a meal. 11/01/18   Hilty, KNadean Corwin MD  clonazePAM (KLONOPIN) 0.5 MG tablet TAKE ONE TABLET BY MOUTH TWICE DAILY 10/05/18   Cottle, CBilley Co, MD  clotrimazole (LOTRIMIN) 1 % cream Apply topically 2 (two) times daily. 09/24/18   MGeorgette Shell MD  famotidine (PEPCID) 40 MG tablet Take 1 tablet (40 mg total) by mouth 2 (two) times daily. 10/24/18   EVolanda Napoleon MD  ferrous sulfate 325 (65 FE) MG tablet Take 1 tablet (325 mg total) by mouth daily with breakfast. 09/24/18   MGeorgette Shell MD  fluconazole (DIFLUCAN) 100 MG tablet Take 1 tablet (100  mg total) by mouth daily. 10/24/18   [name], MD  folic acid (FOLVITE) 1 MG tablet Take 1 tablet (1 mg total) by mouth daily. 09/24/18   Georgette Shell, MD  furosemide (LASIX) 40 MG tablet Take 1 tablet (40 mg total) by mouth daily. 10/13/18   Duke, Tami Lin, PA  hydrALAZINE (APRESOLINE) 50 MG tablet Take 75 mg by mouth 3 (three) times daily.    [provider]  isosorbide mononitrate (IMDUR) 30 MG 24 hr tablet Take 1 tablet (30 mg total) by mouth daily.  10/13/18 10/08/19  Ledora Bottcher, PA  levothyroxine (SYNTHROID) 75 MCG tablet Take 75 mcg by mouth daily. 09/07/18   [provider]  lidocaine-prilocaine (EMLA) cream Apply to affected area once 11/07/18   [name], MD  megestrol (MEGACE) 400 MG/10ML suspension Take 10 mLs (400 mg total) by mouth 2 (two) times daily. 11/03/18   [name], MD  nicotine (NICODERM CQ - DOSED IN MG/24 HOURS) 21 mg/24hr patch Place 1 patch (21 mg total) onto the skin daily. 09/24/18   Georgette Shell, MD  ondansetron (ZOFRAN) 8 MG tablet Take 1 tablet (8 mg total) by mouth 2 (two) times daily as needed for refractory nausea / vomiting. Start on day 3 after chemotherapy. 11/07/18   [name], MD  PARoxetine (PAXIL) 40 MG tablet Take 60 mg by mouth every morning.    [provider]  potassium chloride (K-DUR) 10 MEQ tablet Take 1 tablet (10 mEq total) by mouth daily. 10/13/18   Duke, Tami Lin, PA  predniSONE (DELTASONE) 20 MG tablet Take 1 tablet (20 mg total) by mouth daily with breakfast. 10/24/18   [name], MD  prochlorperazine (COMPAZINE) 10 MG tablet Take 1 tablet (10 mg total) by mouth every 6 (six) hours as needed (Nausea or vomiting). 11/07/18   [name], MD  traZODone (DESYREL) 50 MG tablet Take 1-2 tablets (50-100 mg total) by mouth at bedtime. 10/09/18   Cottle, Billey Co., MD     Family History  Problem Relation Age of Onset  . Stroke Mother        Mini-strokes. Died @ 55.  . Dementia Mother   . Lung cancer Father        Died in his 64's  . Addison's disease Sister   . Hypertension Brother   . CAD Brother   . Hypertension Brother     Social History   Socioeconomic History  . Marital status: Married    Spouse name: Not on file  . Number of children: Not on file  . Years of education: Not on file  . Highest education level: Not on file  Occupational History  . Not on file  Social Needs  . Financial resource strain: Not on file  .  Food insecurity    Worry: Not on file    Inability: Not on file  . Transportation needs    Medical: Not on file    Non-medical: Not on file  Tobacco Use  . Smoking status: Current Every Day Smoker    Packs/day: 0.25    Years: 40.00    Pack years: 10.00    Types: Cigarettes  . Smokeless tobacco: Never Used  . Tobacco comment: smoking 3-4 cigarettes/day  Substance and Sexual Activity  . Alcohol use: Not Currently    Comment: 1-2 gl wine / night - none since 07/2018  . Drug use: Not Currently    Comment:  prev used drugs ~ 35 yrs ago.  Marland Kitchen Sexual activity: Not on file  Lifestyle  . Physical activity    Days per week: Not on file    Minutes per session: Not on file  . Stress: Not on file  Relationships  . Social Herbalist on phone: Not on file    Gets together: Not on file    Attends religious service: Not on file    Active member of club or organization: Not on file    Attends meetings of clubs or organizations: Not on file    Relationship status: Not on file  Other Topics Concern  . Not on file  Social History Narrative   Lives in [place] with her husband.  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     Review of Systems: A 12 point ROS discussed and pertinent positives are indicated in the HPI above.  All other systems are negative.  Review of Systems  Constitutional: Negative for chills and fever.  Respiratory: Negative for shortness of breath and wheezing.   Cardiovascular: Negative for chest pain and palpitations.  Gastrointestinal: Negative for abdominal pain.  Neurological: Negative for headaches.  Psychiatric/Behavioral: Negative for behavioral problems and confusion.    Vital Signs: BP 135/70 (BP Location: Right Arm)   Pulse 63   Temp 98.2 F (36.8 C) (Oral)   Resp 18   SpO2 100%   Physical Exam Vitals signs and nursing note reviewed.  Constitutional:      General: She is not in acute distress.    Appearance: Normal appearance.   Cardiovascular:     Rate and Rhythm: Normal rate and regular rhythm.     Heart sounds: Normal heart sounds. No murmur.  Pulmonary:     Effort: Pulmonary effort is normal. No respiratory distress.     Breath sounds: Normal breath sounds. No wheezing.  Skin:    General: Skin is warm and dry.  Neurological:     Mental Status: She is alert and oriented to person, place, and time.  Psychiatric:        Mood and Affect: Mood normal.        Behavior: Behavior normal.        Thought Content: Thought content normal.        Judgment: Judgment normal.      MD Evaluation Airway: WNL Heart: WNL Abdomen: WNL Chest/ Lungs: WNL ASA  Classification: 3 Mallampati/Airway Score: Two   Imaging: Ct Soft Tissue Neck W Contrast  Result Date: 10/18/2018 CLINICAL DATA:  Enlarged lymph nodes in the chest and axilla, possible adenopathy in the left neck. Elevated IgM EXAM: CT NECK WITH CONTRAST TECHNIQUE: Multidetector CT imaging of the neck was performed using the standard protocol following the bolus administration of intravenous contrast. CONTRAST:  15m OMNIPAQUE IOHEXOL 300 MG/ML  SOLN COMPARISON:  None. FINDINGS: Pharynx and larynx: Negative for mass or thickening of Waldeyer's ring. Salivary glands: No inflammation, mass, or stone. Thyroid: Normal. Lymph nodes: None enlarged or abnormal density. Vascular: Atheromatous calcification. Limited intracranial: Negative Visualized orbits: Negative Mastoids and visualized paranasal sinuses: Sclerotic wall thickening of the right maxillary sinus from old inflammation. Skeleton: No acute or aggressive finding. There is advanced cervical disc and facet degeneration, especially the right-sided facets with levo scoliotic curvature. Upper chest: Reported separately Other: Negative for adenopathy or malignant IMPRESSION: 1. Benign appearance of the neck.  Negative for adenopathy. 2. Chest CT reported separately. Electronically Signed   By: JMonte Fantasia  M.D.   On:  10/18/2018 11:18   Ct Chest W Contrast  Result Date: 10/18/2018 CLINICAL DATA:  Lymphadenopathy. EXAM: CT CHEST, ABDOMEN, AND PELVIS WITH CONTRAST TECHNIQUE: Multidetector CT imaging of the chest, abdomen and pelvis was performed following the standard protocol during bolus administration of intravenous contrast. CONTRAST:  26m OMNIPAQUE IOHEXOL 300 MG/ML  SOLN COMPARISON:  Noncontrast chest CT 09/20/2018. Abdomen and pelvis CT without contrast 09/01/2018 FINDINGS: CT CHEST FINDINGS Cardiovascular: Heart size within normal limits. Trace pericardial effusion again noted. Coronary artery calcification is evident. Atherosclerotic calcification is noted in the wall of the thoracic aorta. Mediastinum/Nodes: Scattered mediastinal lymph nodes are within normal limits for size. Small lymph nodes are evident in both hilar regions. The esophagus has normal imaging features. There is no axillary lymphadenopathy. Lungs/Pleura: Centrilobular emphsyema noted. Right lower lobe atelectasis with small right pleural effusion is similar to prior. There is a tiny left pleural effusion, also not substantially changed in the interval. No suspicious pulmonary nodule or mass. Musculoskeletal: No worrisome lytic or sclerotic osseous abnormality. CT ABDOMEN PELVIS FINDINGS Hepatobiliary: Tiny hypoattenuating lesion in the lateral segment left liver is stable, likely benign. There is no evidence for gallstones, gallbladder wall thickening, or pericholecystic fluid. No intrahepatic or extrahepatic biliary dilation. Pancreas: No focal mass lesion. No dilatation of the main duct. No intraparenchymal cyst. No peripancreatic edema. Spleen: No splenomegaly. No focal mass lesion. Adrenals/Urinary Tract: No adrenal nodule or mass. 6 mm stone again identified in the right renal pelvis without hydronephrosis. Tiny low-density cortical lesion in the lower pole is likely a cyst. Right ureter unremarkable. Tiny nonobstructing stones noted in the upper  pole and interpolar regions of the left kidney. Left ureter unremarkable. The urinary bladder appears normal for the degree of distention. Stomach/Bowel: Stomach is unremarkable. No gastric wall thickening. No evidence of outlet obstruction. Duodenum is normally positioned as is the ligament of Treitz. No small bowel wall thickening. No small bowel dilatation. The terminal ileum is normal. The appendix is normal. No gross colonic mass. No colonic wall thickening. Vascular/Lymphatic: There is abdominal aortic atherosclerosis without aneurysm. Small retroperitoneal lymph nodes are unenlarged by CT size criteria. There is no gastrohepatic or hepatoduodenal ligament lymphadenopathy. No intraperitoneal or retroperitoneal lymphadenopathy. No pelvic sidewall lymphadenopathy. Reproductive: The uterus is unremarkable.  There is no adnexal mass. Other: No substantial intraperitoneal free fluid. Musculoskeletal: No worrisome lytic or sclerotic osseous abnormality. Degenerative changes noted in the lumbar spine. IMPRESSION: 1. No evidence for lymphadenopathy in the chest, abdomen, or pelvis. 2. Right lower lobe collapse/consolidation with small bilateral pleural effusions, right greater than left. This is stable since prior exam. 3. Bilateral nonobstructing nephrolithiasis. 4.  Aortic Atherosclerois (ICD10-170.0) 5.  Emphysema. (([id number]) Electronically Signed   By: EMisty StanleyM.D.   On: 10/18/2018 09:48   Ct Abdomen Pelvis W Contrast  Result Date: 10/18/2018 CLINICAL DATA:  Lymphadenopathy. EXAM: CT CHEST, ABDOMEN, AND PELVIS WITH CONTRAST TECHNIQUE: Multidetector CT imaging of the chest, abdomen and pelvis was performed following the standard protocol during bolus administration of intravenous contrast. CONTRAST:  876mOMNIPAQUE IOHEXOL 300 MG/ML  SOLN COMPARISON:  Noncontrast chest CT 09/20/2018. Abdomen and pelvis CT without contrast 09/01/2018 FINDINGS: CT CHEST FINDINGS Cardiovascular: Heart size within normal  limits. Trace pericardial effusion again noted. Coronary artery calcification is evident. Atherosclerotic calcification is noted in the wall of the thoracic aorta. Mediastinum/Nodes: Scattered mediastinal lymph nodes are within normal limits for size. Small lymph nodes are evident in both hilar regions. The esophagus has  normal imaging features. There is no axillary lymphadenopathy. Lungs/Pleura: Centrilobular emphsyema noted. Right lower lobe atelectasis with small right pleural effusion is similar to prior. There is a tiny left pleural effusion, also not substantially changed in the interval. No suspicious pulmonary nodule or mass. Musculoskeletal: No worrisome lytic or sclerotic osseous abnormality. CT ABDOMEN PELVIS FINDINGS Hepatobiliary: Tiny hypoattenuating lesion in the lateral segment left liver is stable, likely benign. There is no evidence for gallstones, gallbladder wall thickening, or pericholecystic fluid. No intrahepatic or extrahepatic biliary dilation. Pancreas: No focal mass lesion. No dilatation of the main duct. No intraparenchymal cyst. No peripancreatic edema. Spleen: No splenomegaly. No focal mass lesion. Adrenals/Urinary Tract: No adrenal nodule or mass. 6 mm stone again identified in the right renal pelvis without hydronephrosis. Tiny low-density cortical lesion in the lower pole is likely a cyst. Right ureter unremarkable. Tiny nonobstructing stones noted in the upper pole and interpolar regions of the left kidney. Left ureter unremarkable. The urinary bladder appears normal for the degree of distention. Stomach/Bowel: Stomach is unremarkable. No gastric wall thickening. No evidence of outlet obstruction. Duodenum is normally positioned as is the ligament of Treitz. No small bowel wall thickening. No small bowel dilatation. The terminal ileum is normal. The appendix is normal. No gross colonic mass. No colonic wall thickening. Vascular/Lymphatic: There is abdominal aortic atherosclerosis  without aneurysm. Small retroperitoneal lymph nodes are unenlarged by CT size criteria. There is no gastrohepatic or hepatoduodenal ligament lymphadenopathy. No intraperitoneal or retroperitoneal lymphadenopathy. No pelvic sidewall lymphadenopathy. Reproductive: The uterus is unremarkable.  There is no adnexal mass. Other: No substantial intraperitoneal free fluid. Musculoskeletal: No worrisome lytic or sclerotic osseous abnormality. Degenerative changes noted in the lumbar spine. IMPRESSION: 1. No evidence for lymphadenopathy in the chest, abdomen, or pelvis. 2. Right lower lobe collapse/consolidation with small bilateral pleural effusions, right greater than left. This is stable since prior exam. 3. Bilateral nonobstructing nephrolithiasis. 4.  Aortic Atherosclerois (ICD10-170.0) 5.  Emphysema. ([id number]) Electronically Signed   By: Misty Stanley M.D.   On: 10/18/2018 09:48   Ct Biopsy  Result Date: 10/26/2018 INDICATION: 68 year old female with a history of monoclonal gammopathy EXAM: CT BONE MARROW BIOPSY AND ASPIRATION; CT BIOPSY MEDICATIONS: None. ANESTHESIA/SEDATION: Moderate (conscious) sedation was employed during this procedure. A total of Versed 2.0 mg and Fentanyl 100 mcg was administered intravenously. Moderate Sedation Time: 10 minutes. The patient's level of consciousness and vital signs were monitored continuously by radiology nursing throughout the procedure under my direct supervision. FLUOROSCOPY TIME:  CT COMPLICATIONS: None PROCEDURE: The procedure risks, benefits, and alternatives were explained to the patient. Questions regarding the procedure were encouraged and answered. The patient understands and consents to the procedure. Scout CT of the pelvis was performed for surgical planning purposes. The posterior right pelvis was prepped with Chlorhexidine in a sterile fashion, and a sterile drape was applied covering the operative field. A sterile gown and sterile gloves were used for the  procedure. Local anesthesia was provided with 1% Lidocaine. Posterior right iliac bone was targeted for biopsy. The skin and subcutaneous tissues were infiltrated with 1% lidocaine without epinephrine. A small stab incision was made with an 11 blade scalpel, and an 11 gauge Murphy needle was advanced with CT guidance to the posterior cortex. Manual forced was used to advance the needle through the posterior cortex and the stylet was removed. A bone marrow aspirate was retrieved and passed to a cytotechnologist in the room. The Murphy needle was then advanced without the stylet for  a core biopsy. The core biopsy was retrieved and also passed to a cytotechnologist. Manual pressure was used for hemostasis and a sterile dressing was placed. No complications were encountered no significant blood loss was encountered. Patient tolerated the procedure well and remained hemodynamically stable throughout. IMPRESSION: Status post CT-guided bone marrow biopsy, with tissue specimen sent to pathology for complete histopathologic analysis Signed, Dulcy Fanny. Earleen Newport, DO [facility] Radiology Electronically Signed   By: Corrie Mckusick D.O.   On: 10/26/2018 10:06   Ct Bone Marrow Biopsy & Aspiration  Result Date: 10/26/2018 INDICATION: 68 year old female with a history of monoclonal gammopathy EXAM: CT BONE MARROW BIOPSY AND ASPIRATION; CT BIOPSY MEDICATIONS: None. ANESTHESIA/SEDATION: Moderate (conscious) sedation was employed during this procedure. A total of Versed 2.0 mg and Fentanyl 100 mcg was administered intravenously. Moderate Sedation Time: 10 minutes. The patient's level of consciousness and vital signs were monitored continuously by radiology nursing throughout the procedure under my direct supervision. FLUOROSCOPY TIME:  CT COMPLICATIONS: None PROCEDURE: The procedure risks, benefits, and alternatives were explained to the patient. Questions regarding the procedure were  encouraged and answered. The patient understands and consents to the procedure. Scout CT of the pelvis was performed for surgical planning purposes. The posterior right pelvis was prepped with Chlorhexidine in a sterile fashion, and a sterile drape was applied covering the operative field. A sterile gown and sterile gloves were used for the procedure. Local anesthesia was provided with 1% Lidocaine. Posterior right iliac bone was targeted for biopsy. The skin and subcutaneous tissues were infiltrated with 1% lidocaine without epinephrine. A small stab incision was made with an 11 blade scalpel, and an 11 gauge Murphy needle was advanced with CT guidance to the posterior cortex. Manual forced was used to advance the needle through the posterior cortex and the stylet was removed. A bone marrow aspirate was retrieved and passed to a cytotechnologist in the room. The Murphy needle was then advanced without the stylet for a core biopsy. The core biopsy was retrieved and also passed to a cytotechnologist. Manual pressure was used for hemostasis and a sterile dressing was placed. No complications were encountered no significant blood loss was encountered. Patient tolerated the procedure well and remained hemodynamically stable throughout. IMPRESSION: Status post CT-guided bone marrow biopsy, with tissue specimen sent to pathology for complete histopathologic analysis Signed, Dulcy Fanny. Earleen Newport, DO [facility] Radiology Electronically Signed   By: Corrie Mckusick D.O.   On: 10/26/2018 10:06    Labs:  CBC: Recent Labs    10/24/18 1119 10/26/18 0730 11/03/18 1426 11/09/18 1015  WBC 4.1 8.4 6.3 6.6  HGB 11.1* 9.5* 9.7* 9.5*  HCT 34.8* 30.8* 30.6* 30.9*  PLT 205 237 207 155    COAGS: Recent Labs    10/26/18 0730 11/09/18 1015  INR 0.9 1.0    BMP: Recent Labs    10/04/18 0410 10/11/18 1418 10/24/18 1119 11/03/18 1426  NA 141 139 139 142  K 4.5 4.6 5.1  5.2*  CL 110 108 109 108  CO2 23 23 21* 26  GLUCOSE 98 110* 106* 89  BUN 35* 33* 36* 38*  CALCIUM 8.7* 9.7 8.6* 9.1  CREATININE 1.75* 1.39* 1.69* 1.35*  GFRNONAA 29* 38* 30* 40*  GFRAA 34* 44* 35* 46*    LIVER FUNCTION TESTS: Recent Labs    09/27/18 2355 10/11/18 1418 10/24/18 1119 11/03/18 1426  BILITOT 0.8 0.3 0.3 0.3  AST 33 13* 12* 18  ALT 32 14 14 41  ALKPHOS 61 71 73 61  PROT 6.2* 6.1* 5.9* 5.4*  ALBUMIN 2.8* 3.4* 3.3* 3.4*     Assessment and Plan:  Waldenstroms macroglobulinemia Plan for image-guided Port-a-cath placement today with Dr. Laurence Ferrari. Patient is NPO. Afebrile and WBCs WNL. She does not take blood thinners. INR 1.0 today.  Risks and benefits of image guided port-a-catheter placement were discussed with the patient including, but not limited to bleeding, infection, pneumothorax, or fibrin sheath development and need for additional procedures. All of the patient's questions were answered, patient is agreeable to proceed. Consent signed and in chart.   Thank you for this interesting consult.  I greatly enjoyed meeting Nevaeha Finerty and look forward to participating in their care.  A copy of this report was sent to the requesting provider on this date.  Electronically Signed: Earley Abide, PA-C 11/09/2018, 10:50 AM   I spent a total of 40 Minutes in face to face in clinical consultation, greater than 50% of which was counseling/coordinating care for waldenstroms macroglobulinemia/Port-a-cath placement.

## 2018-11-09 NOTE — Procedures (Signed)
Interventional Radiology Procedure Note  Procedure: Placement of a right IJ approach single lumen PowerPort.  Tip is positioned at the superior cavoatrial junction and catheter is ready for immediate use.  Complications: No immediate Recommendations:  - Ok to shower tomorrow - Do not submerge for 7 days - Routine line care   Signed,  Heath K. McCullough, MD   

## 2018-11-10 ENCOUNTER — Inpatient Hospital Stay: Payer: Medicare Other

## 2018-11-10 ENCOUNTER — Encounter: Payer: Self-pay | Admitting: *Deleted

## 2018-11-10 VITALS — BP 132/63 | HR 78 | Temp 97.9°F | Resp 16

## 2018-11-10 DIAGNOSIS — Z5112 Encounter for antineoplastic immunotherapy: Secondary | ICD-10-CM | POA: Diagnosis not present

## 2018-11-10 DIAGNOSIS — C880 Waldenstrom macroglobulinemia: Secondary | ICD-10-CM

## 2018-11-10 MED ORDER — PALONOSETRON HCL INJECTION 0.25 MG/5ML
INTRAVENOUS | Status: AC
  Filled 2018-11-10: qty 5

## 2018-11-10 MED ORDER — SODIUM CHLORIDE 0.9 % IV SOLN
500.0000 mg/m2 | Freq: Once | INTRAVENOUS | Status: AC
  Administered 2018-11-10: 800 mg via INTRAVENOUS
  Filled 2018-11-10: qty 50

## 2018-11-10 MED ORDER — PALONOSETRON HCL INJECTION 0.25 MG/5ML
0.2500 mg | Freq: Once | INTRAVENOUS | Status: AC
  Administered 2018-11-10: 0.25 mg via INTRAVENOUS

## 2018-11-10 MED ORDER — SODIUM CHLORIDE 0.9 % IV SOLN
640.0000 mg/m2 | Freq: Once | INTRAVENOUS | Status: AC
  Administered 2018-11-10: 1040 mg via INTRAVENOUS
  Filled 2018-11-10: qty 52

## 2018-11-10 MED ORDER — SODIUM CHLORIDE 0.9% FLUSH
10.0000 mL | INTRAVENOUS | Status: DC | PRN
  Administered 2018-11-10: 10 mL
  Filled 2018-11-10: qty 10

## 2018-11-10 MED ORDER — ACETAMINOPHEN 325 MG PO TABS
650.0000 mg | ORAL_TABLET | Freq: Once | ORAL | Status: AC
  Administered 2018-11-10: 650 mg via ORAL

## 2018-11-10 MED ORDER — DEXAMETHASONE SODIUM PHOSPHATE 10 MG/ML IJ SOLN
INTRAMUSCULAR | Status: AC
  Filled 2018-11-10: qty 1

## 2018-11-10 MED ORDER — DEXAMETHASONE SODIUM PHOSPHATE 10 MG/ML IJ SOLN
10.0000 mg | Freq: Once | INTRAMUSCULAR | Status: AC
  Administered 2018-11-10: 10 mg via INTRAVENOUS

## 2018-11-10 MED ORDER — HEPARIN SOD (PORK) LOCK FLUSH 100 UNIT/ML IV SOLN
500.0000 [IU] | Freq: Once | INTRAVENOUS | Status: AC | PRN
  Administered 2018-11-10: 500 [IU]
  Filled 2018-11-10: qty 5

## 2018-11-10 MED ORDER — SODIUM CHLORIDE 0.9 % IV SOLN
Freq: Once | INTRAVENOUS | Status: AC
  Administered 2018-11-10: 09:00:00 via INTRAVENOUS
  Filled 2018-11-10: qty 250

## 2018-11-10 MED ORDER — ACETAMINOPHEN 325 MG PO TABS
ORAL_TABLET | ORAL | Status: AC
  Filled 2018-11-10: qty 2

## 2018-11-10 MED ORDER — DIPHENHYDRAMINE HCL 25 MG PO CAPS
ORAL_CAPSULE | ORAL | Status: AC
  Filled 2018-11-10: qty 2

## 2018-11-10 MED ORDER — DIPHENHYDRAMINE HCL 25 MG PO CAPS
50.0000 mg | ORAL_CAPSULE | Freq: Once | ORAL | Status: AC
  Administered 2018-11-10: 50 mg via ORAL

## 2018-11-10 NOTE — Progress Notes (Signed)
Labs from yesterday and 11/03/18 reviewed with Dr. Marin Olp.  Ok to proceed with treatment today per Dr. Marin Olp

## 2018-11-10 NOTE — Patient Instructions (Signed)
Implanted Port Insertion, Care After This sheet gives you information about how to care for yourself after your procedure. Your health care provider may also give you more specific instructions. If you have problems or questions, contact your health care provider. What can I expect after the procedure? After the procedure, it is common to have:  Discomfort at the port insertion site.  Bruising on the skin over the port. This should improve over 3-4 days. Follow these instructions at home: Port care  After your port is placed, you will get a manufacturer's information card. The card has information about your port. Keep this card with you at all times.  Take care of the port as told by your health care provider. Ask your health care provider if you or a family member can get training for taking care of the port at home. A home health care nurse may also take care of the port.  Make sure to remember what type of port you have. Incision care      Follow instructions from your health care provider about how to take care of your port insertion site. Make sure you: ? Wash your hands with soap and water before and after you change your bandage (dressing). If soap and water are not available, use hand sanitizer. ? Change your dressing as told by your health care provider. ? Leave stitches (sutures), skin glue, or adhesive strips in place. These skin closures may need to stay in place for 2 weeks or longer. If adhesive strip edges start to loosen and curl up, you may trim the loose edges. Do not remove adhesive strips completely unless your health care provider tells you to do that.  Check your port insertion site every day for signs of infection. Check for: ? Redness, swelling, or pain. ? Fluid or blood. ? Warmth. ? Pus or a bad smell. Activity  Return to your normal activities as told by your health care provider. Ask your health care provider what activities are safe for you.  Do not  lift anything that is heavier than 10 lb (4.5 kg), or the limit that you are told, until your health care provider says that it is safe. General instructions  Take over-the-counter and prescription medicines only as told by your health care provider.  Do not take baths, swim, or use a hot tub until your health care provider approves. Ask your health care provider if you may take showers. You may only be allowed to take sponge baths.  Do not drive for 24 hours if you were given a sedative during your procedure.  Wear a medical alert bracelet in case of an emergency. This will tell any health care providers that you have a port.  Keep all follow-up visits as told by your health care provider. This is important. Contact a health care provider if:  You cannot flush your port with saline as directed, or you cannot draw blood from the port.  You have a fever or chills.  You have redness, swelling, or pain around your port insertion site.  You have fluid or blood coming from your port insertion site.  Your port insertion site feels warm to the touch.  You have pus or a bad smell coming from the port insertion site. Get help right away if:  You have chest pain or shortness of breath.  You have bleeding from your port that you cannot control. Summary  Take care of the port as told by your health   care provider. Keep the manufacturer's information card with you at all times.  Change your dressing as told by your health care provider.  Contact a health care provider if you have a fever or chills or if you have redness, swelling, or pain around your port insertion site.  Keep all follow-up visits as told by your health care provider. This information is not intended to replace advice given to you by your health care provider. Make sure you discuss any questions you have with your health care provider. Document Released: 01/24/2013 Document Revised: 11/01/2017 Document Reviewed: 11/01/2017  Elsevier Patient Education  2020 Elsevier Inc.  

## 2018-11-10 NOTE — Patient Instructions (Addendum)
[facility] Discharge Instructions for Patients Receiving Chemotherapy  Today you received the following chemotherapy agents Rituxan, Cytoxan  To help prevent nausea and vomiting after your treatment, we encourage you to take your nausea medication   1)  Can take (Prochlorperazine) Compazine by mouth for nausea every 6 hours as needed   2) Beginning Monday 7/27, if you are still experiencing nausea or vomiting, can start Zofran (Ondansetron) 2 times daily as needed.   If you develop nausea and vomiting that is not controlled by your nausea medication, call the clinic.   BELOW ARE SYMPTOMS THAT SHOULD BE REPORTED IMMEDIATELY:  *FEVER GREATER THAN 100.5 F  *CHILLS WITH OR WITHOUT FEVER  NAUSEA AND VOMITING THAT IS NOT CONTROLLED WITH YOUR NAUSEA MEDICATION  *UNUSUAL SHORTNESS OF BREATH  *UNUSUAL BRUISING OR BLEEDING  TENDERNESS IN MOUTH AND THROAT WITH OR WITHOUT PRESENCE OF ULCERS  *URINARY PROBLEMS  *BOWEL PROBLEMS  UNUSUAL RASH Items with * indicate a potential emergency and should be followed up as soon as possible.  Feel free to call the clinic should you have any questions or concerns. The clinic phone number is [phone].  Please show the [place] at check-in to the Emergency Department and triage nurse.

## 2018-11-13 ENCOUNTER — Other Ambulatory Visit: Payer: Self-pay | Admitting: Hematology & Oncology

## 2018-11-14 ENCOUNTER — Encounter: Payer: Medicare Other | Admitting: Physical Therapy

## 2018-11-14 ENCOUNTER — Telehealth: Payer: Self-pay | Admitting: Physician Assistant

## 2018-11-14 NOTE — Telephone Encounter (Signed)

## 2018-11-15 ENCOUNTER — Ambulatory Visit (INDEPENDENT_AMBULATORY_CARE_PROVIDER_SITE_OTHER): Payer: Medicare Other | Admitting: Physician Assistant

## 2018-11-15 ENCOUNTER — Encounter: Payer: Self-pay | Admitting: Physician Assistant

## 2018-11-15 ENCOUNTER — Other Ambulatory Visit: Payer: Self-pay

## 2018-11-15 VITALS — BP 173/96 | HR 99 | Ht 66.0 in | Wt 128.0 lb

## 2018-11-15 DIAGNOSIS — N183 Chronic kidney disease, stage 3 unspecified: Secondary | ICD-10-CM

## 2018-11-15 DIAGNOSIS — R630 Anorexia: Secondary | ICD-10-CM

## 2018-11-15 DIAGNOSIS — I5022 Chronic systolic (congestive) heart failure: Secondary | ICD-10-CM

## 2018-11-15 DIAGNOSIS — E785 Hyperlipidemia, unspecified: Secondary | ICD-10-CM | POA: Diagnosis not present

## 2018-11-15 MED ORDER — HYDRALAZINE HCL 50 MG PO TABS: 100.0000 mg | ORAL_TABLET | Freq: Three times a day (TID) | ORAL | 1 refills | Status: DC

## 2018-11-15 MED ORDER — ATORVASTATIN CALCIUM 40 MG PO TABS: 40.0000 mg | ORAL_TABLET | Freq: Every day | ORAL | 3 refills | Status: DC

## 2018-11-15 MED ORDER — CARVEDILOL 25 MG PO TABS: 25.0000 mg | ORAL_TABLET | Freq: Two times a day (BID) | ORAL | 11 refills | Status: DC

## 2018-11-15 NOTE — Patient Instructions (Signed)
Medication Instructions:  Increase Hydralazine to 100 mg (2 tablets) three times daily.  If blood pressure is still not controlled in 1 week, please increase Isosorbide to 30 mg twice daily.  If you need a refill on your cardiac medications before your next appointment, please call your pharmacy.    Follow-Up: At [facility], you and your health needs are our priority.  As part of our continuing mission to provide you with exceptional heart care, we have created designated Provider Care Teams.  These Care Teams include your primary Cardiologist (physician) and Advanced Practice Providers (APPs -  Physician Assistants and Nurse Practitioners) who all work together to provide you with the care you need, when you need it. You will need a follow up appointment in 1 month.  Please call our office 2 months in advance to schedule this appointment.  You may see Pixie Casino, MD or one of the following Advanced Practice Providers on your designated Care Team: [place], [place] . Fabian Sharp, PA-C  Any Other Special Instructions Will Be Listed Below (If Applicable). None

## 2018-11-16 ENCOUNTER — Encounter: Payer: Medicare Other | Admitting: Physical Therapy

## 2018-11-16 ENCOUNTER — Other Ambulatory Visit: Payer: Self-pay | Admitting: Hematology & Oncology

## 2018-11-17 ENCOUNTER — Encounter (HOSPITAL_COMMUNITY): Payer: Self-pay

## 2018-11-17 ENCOUNTER — Telehealth: Payer: Self-pay | Admitting: *Deleted

## 2018-11-17 ENCOUNTER — Emergency Department (HOSPITAL_COMMUNITY)
Admission: EM | Admit: 2018-11-17 | Discharge: 2018-11-17 | Disposition: A | Discharge status: Home / Self Care | Payer: Medicare Other | Attending: Emergency Medicine | Admitting: Emergency Medicine

## 2018-11-17 ENCOUNTER — Other Ambulatory Visit: Payer: Self-pay

## 2018-11-17 DIAGNOSIS — F17210 Nicotine dependence, cigarettes, uncomplicated: Secondary | ICD-10-CM | POA: Diagnosis not present

## 2018-11-17 DIAGNOSIS — R350 Frequency of micturition: Secondary | ICD-10-CM | POA: Diagnosis not present

## 2018-11-17 DIAGNOSIS — E039 Hypothyroidism, unspecified: Secondary | ICD-10-CM | POA: Insufficient documentation

## 2018-11-17 DIAGNOSIS — Z79899 Other long term (current) drug therapy: Secondary | ICD-10-CM | POA: Diagnosis not present

## 2018-11-17 DIAGNOSIS — R5383 Other fatigue: Secondary | ICD-10-CM | POA: Diagnosis not present

## 2018-11-17 DIAGNOSIS — N183 Chronic kidney disease, stage 3 (moderate): Secondary | ICD-10-CM | POA: Diagnosis not present

## 2018-11-17 DIAGNOSIS — C880 Waldenstrom macroglobulinemia: Secondary | ICD-10-CM | POA: Insufficient documentation

## 2018-11-17 DIAGNOSIS — Z7982 Long term (current) use of aspirin: Secondary | ICD-10-CM | POA: Diagnosis not present

## 2018-11-17 DIAGNOSIS — I5042 Chronic combined systolic (congestive) and diastolic (congestive) heart failure: Secondary | ICD-10-CM | POA: Insufficient documentation

## 2018-11-17 DIAGNOSIS — R6883 Chills (without fever): Secondary | ICD-10-CM | POA: Insufficient documentation

## 2018-11-17 DIAGNOSIS — R531 Weakness: Secondary | ICD-10-CM | POA: Insufficient documentation

## 2018-11-17 DIAGNOSIS — J449 Chronic obstructive pulmonary disease, unspecified: Secondary | ICD-10-CM | POA: Insufficient documentation

## 2018-11-17 DIAGNOSIS — I130 Hypertensive heart and chronic kidney disease with heart failure and stage 1 through stage 4 chronic kidney disease, or unspecified chronic kidney disease: Secondary | ICD-10-CM | POA: Insufficient documentation

## 2018-11-17 LAB — URINALYSIS, ROUTINE W REFLEX MICROSCOPIC
Bacteria, UA: NONE SEEN
Bilirubin Urine: NEGATIVE
Glucose, UA: NEGATIVE mg/dL
Ketones, ur: NEGATIVE mg/dL
Leukocytes,Ua: NEGATIVE
Nitrite: NEGATIVE
Protein, ur: >=300 mg/dL — AB
Specific Gravity, Urine: 1.009 (ref 1.005–1.030)
pH: 5 (ref 5.0–8.0)

## 2018-11-17 LAB — COMPREHENSIVE METABOLIC PANEL
ALT: 52 U/L — ABNORMAL HIGH (ref 0–44)
AST: 19 U/L (ref 15–41)
Albumin: 3 g/dL — ABNORMAL LOW (ref 3.5–5.0)
Alkaline Phosphatase: 49 U/L (ref 38–126)
Anion gap: 8 (ref 5–15)
BUN: 36 mg/dL — ABNORMAL HIGH (ref 8–23)
CO2: 20 mmol/L — ABNORMAL LOW (ref 22–32)
Calcium: 9 mg/dL (ref 8.9–10.3)
Chloride: 111 mmol/L (ref 98–111)
Creatinine, Ser: 1.26 mg/dL — ABNORMAL HIGH (ref 0.44–1.00)
GFR calc Af Amer: 50 mL/min — ABNORMAL LOW (ref >60)
GFR calc non Af Amer: 43 mL/min — ABNORMAL LOW (ref >60)
Glucose, Bld: 114 mg/dL — ABNORMAL HIGH (ref 70–99)
Potassium: 5.8 mmol/L — ABNORMAL HIGH (ref 3.5–5.1)
Sodium: 139 mmol/L (ref 135–145)
Total Bilirubin: 0.5 mg/dL (ref 0.3–1.2)
Total Protein: 5.3 g/dL — ABNORMAL LOW (ref 6.5–8.1)

## 2018-11-17 LAB — CBC WITH DIFFERENTIAL/PLATELET
Abs Immature Granulocytes: 0.06 10*3/uL (ref 0.00–0.07)
Basophils Absolute: 0 10*3/uL (ref 0.0–0.1)
Basophils Relative: 1 %
Eosinophils Absolute: 0 10*3/uL (ref 0.0–0.5)
Eosinophils Relative: 1 %
HCT: 27.4 % — ABNORMAL LOW (ref 36.0–46.0)
Hemoglobin: 8.6 g/dL — ABNORMAL LOW (ref 12.0–15.0)
Immature Granulocytes: 2 %
Lymphocytes Relative: 13 %
Lymphs Abs: 0.4 10*3/uL — ABNORMAL LOW (ref 0.7–4.0)
MCH: 30.5 pg (ref 26.0–34.0)
MCHC: 31.4 g/dL (ref 30.0–36.0)
MCV: 97.2 fL (ref 80.0–100.0)
Monocytes Absolute: 0.1 10*3/uL (ref 0.1–1.0)
Monocytes Relative: 3 %
Neutro Abs: 2.4 10*3/uL (ref 1.7–7.7)
Neutrophils Relative %: 80 %
Platelets: 229 10*3/uL (ref 150–400)
RBC: 2.82 MIL/uL — ABNORMAL LOW (ref 3.87–5.11)
RDW: 13.8 % (ref 11.5–15.5)
WBC: 2.9 10*3/uL — ABNORMAL LOW (ref 4.0–10.5)
nRBC: 0 % (ref 0.0–0.2)

## 2018-11-17 LAB — POTASSIUM: Potassium: 5.6 mmol/L — ABNORMAL HIGH (ref 3.5–5.1)

## 2018-11-17 MED ORDER — SODIUM CHLORIDE 0.9 % IV BOLUS
500.0000 mL | Freq: Once | INTRAVENOUS | Status: AC
  Administered 2018-11-17: 500 mL via INTRAVENOUS

## 2018-11-17 MED ORDER — CLONAZEPAM 0.5 MG PO TABS
0.5000 mg | ORAL_TABLET | Freq: Once | ORAL | Status: AC
  Administered 2018-11-17: 0.5 mg via ORAL
  Filled 2018-11-17: qty 1

## 2018-11-17 NOTE — ED Provider Notes (Signed)
[facility] DEPT Provider Note   CSN: [id number] Arrival date & time: 11/17/18  1356    History   Chief Complaint Chief Complaint  Patient presents with  . Weakness    HPI Tiffany Leon is a 68 y.o. female.     HPI Patient recently diagnosed with Waldenstrom's macroglobulinemia and had first round of chemotherapy 5 days ago.  States she has had increased fatigue and generalized weakness of her last few days.  She endorses urinary frequency.  She denies nausea, vomiting or diarrhea.  Denies chest pain or abdominal pain.  No shortness of breath or cough.  States she is had chills but no fever. Past Medical History:  Diagnosis Date  . Acute kidney injury ([place])    a. 09/2018  . Anxiety   . Aortic atherosclerosis ([place])    a. 09/2018 noted on Chest CT.  Marland Kitchen Chronic combined systolic (congestive) and diastolic (congestive) heart failure ([place])    a. 09/2018 Echo: EF 45-50%, diff HK. Triv to small circumfirential pericardial eff.  . Chronic DOE (dyspnea on exertion)   . Claudication [facility])    a. Bilat hip claudication since ~ 2019.  Marland Kitchen Depression   . Dyspnea   . Emphysema lung ([place])   . Exertional angina ([facility])    a. Ex angina since ~ 2019.  Marland Kitchen GERD (gastroesophageal reflux disease)   . Goals of care, counseling/discussion 11/03/2018  . Hiatal hernia   . History of bronchitis   . History of kidney stones   . Hypothyroidism   . Pleural effusion    a. 09/2018 s/p thoracentesis-->946ml.  . Pleural effusion 09/2018  . Rash 10/2018   Rash began Saturday with unknown reason.  MD seen Monday 10/23/18  . Resting tremor   . Tobacco abuse   . Waldenstrom's macroglobulinemia ([place]) 11/03/2018    Patient Active Problem List   Diagnosis Date Noted  . Anemia, chronic renal failure 11/06/2018  . Goals of care, counseling/discussion 11/03/2018  . Waldenstrom's macroglobulinemia ([place]) 11/03/2018  . Chest pain 09/28/2018  . Abdominal pain 09/28/2018  . HLD  (hyperlipidemia) 09/28/2018  . Depression 09/28/2018  . Chronic systolic CHF (congestive heart failure) ([place]) 09/28/2018  . Iron deficiency 09/28/2018  . SOB (shortness of breath) 09/20/2018  . Hypertensive urgency 09/20/2018  . CKD (chronic kidney disease), stage III ([place]) 09/20/2018  . Nausea and vomiting 09/20/2018  . Hypothyroidism 09/20/2018  . Pleural effusion 09/20/2018  . Tobacco abuse 09/20/2018  . Tinea pedis 09/20/2018  . COPD (chronic obstructive pulmonary disease) ([place]) 09/20/2018  . Nephrolithiasis 09/20/2018  . Panic disorder with agoraphobia 05/09/2018    Past Surgical History:  Procedure Laterality Date  . BREAST LUMPECTOMY WITH RADIOACTIVE SEED LOCALIZATION Left 12/28/2017   Procedure: BREAST LUMPECTOMY WITH RADIOACTIVE SEED LOCALIZATION;  Surgeon: Jovita Kussmaul, MD;  Location: [employer];  Service: General;  Laterality: Left;  . DG THUMB RIGHT HAND ([place] HX)    . IR IMAGING GUIDED PORT INSERTION  11/09/2018  . IR THORACENTESIS ASP PLEURAL SPACE W/IMG GUIDE  09/20/2018  . KIDNEY STONE SURGERY       OB History   No obstetric history on file.      Home Medications    Prior to Admission medications   Medication Sig Start Date End Date Taking? Authorizing Provider  aspirin 81 MG chewable tablet Chew 1 tablet (81 mg total) by mouth daily. 09/25/18   Georgette Shell, MD  atorvastatin (LIPITOR) 40 MG tablet Take 1 tablet (40  mg total) by mouth daily at 6 PM. 11/15/18   Duke, Tami Lin, PA  carvedilol (COREG) 25 MG tablet Take 1 tablet (25 mg total) by mouth 2 (two) times daily with a meal. 11/15/18   Duke, Tami Lin, PA  clonazePAM (KLONOPIN) 0.5 MG tablet TAKE ONE TABLET BY MOUTH TWICE DAILY 10/05/18   Cottle, Billey Co., MD  clotrimazole (LOTRIMIN) 1 % cream Apply topically 2 (two) times daily. 09/24/18   Georgette Shell, MD  famotidine (PEPCID) 40 MG tablet Take 1 tablet (40 mg total) by mouth 2 (two) times daily. 10/24/18   [name], MD  ferrous  sulfate 325 (65 FE) MG tablet Take 1 tablet (325 mg total) by mouth daily with breakfast. 09/24/18   Georgette Shell, MD  fluconazole (DIFLUCAN) 100 MG tablet Take 1 tablet (100 mg total) by mouth daily. 10/24/18   [name], MD  folic acid (FOLVITE) 1 MG tablet Take 1 tablet (1 mg total) by mouth daily. 09/24/18   Georgette Shell, MD  furosemide (LASIX) 40 MG tablet Take 1 tablet (40 mg total) by mouth daily. 10/13/18   Duke, Tami Lin, PA  hydrALAZINE (APRESOLINE) 50 MG tablet Take 2 tablets (100 mg total) by mouth 3 (three) times daily. 11/15/18   Duke, Tami Lin, PA  isosorbide mononitrate (IMDUR) 30 MG 24 hr tablet Take 1 tablet (30 mg total) by mouth daily. 10/13/18 10/08/19  Ledora Bottcher, PA  levothyroxine (SYNTHROID) 75 MCG tablet Take 75 mcg by mouth daily. 09/07/18   [provider]  lidocaine-prilocaine (EMLA) cream Apply to affected area once 11/07/18   [name], MD  megestrol (MEGACE) 400 MG/10ML suspension Take 10 mLs (400 mg total) by mouth 2 (two) times daily. 11/03/18   [name], MD  nicotine (NICODERM CQ - DOSED IN MG/24 HOURS) 21 mg/24hr patch Place 1 patch (21 mg total) onto the skin daily. 09/24/18   Georgette Shell, MD  ondansetron (ZOFRAN) 8 MG tablet Take 1 tablet (8 mg total) by mouth 2 (two) times daily as needed for refractory nausea / vomiting. Start on day 3 after chemotherapy. 11/07/18   [name], MD  PARoxetine (PAXIL) 40 MG tablet Take 60 mg by mouth every morning.    [provider]  potassium chloride (K-DUR) 10 MEQ tablet Take 1 tablet (10 mEq total) by mouth daily. 10/13/18   Duke, Tami Lin, PA  predniSONE (DELTASONE) 20 MG tablet TAKE 1 TABLET BY MOUTH DAILY WITH BREAKFAST 11/16/18   [name], MD  prochlorperazine (COMPAZINE) 10 MG tablet Take 1 tablet (10 mg total) by mouth every 6 (six) hours as needed (Nausea or vomiting). 11/07/18   [name], MD  traZODone (DESYREL) 50 MG tablet  Take 1-2 tablets (50-100 mg total) by mouth at bedtime. 10/09/18   Cottle, Billey Co., MD    Family History Family History  Problem Relation Age of Onset  . Stroke Mother        Mini-strokes. Died @ 78.  . Dementia Mother   . Lung cancer Father        Died in his 53's  . Addison's disease Sister   . Hypertension Brother   . CAD Brother   . Hypertension Brother     Social History Social History   Tobacco Use  . Smoking status: Current Every Day Smoker    Packs/day: 0.25    Years: 40.00    Pack years:  10.00    Types: Cigarettes  . Smokeless tobacco: Never Used  . Tobacco comment: smoking 3-4 cigarettes/day  Substance Use Topics  . Alcohol use: Not Currently    Comment: 1-2 gl wine / night - none since 07/2018  . Drug use: Not Currently    Comment: prev used drugs ~ 35 yrs ago.     Allergies   Codeine   Review of Systems Review of Systems  Constitutional: Positive for chills and fatigue. Negative for fever.  HENT: Negative for sore throat and trouble swallowing.   Eyes: Negative for visual disturbance.  Respiratory: Negative for cough and shortness of breath.   Cardiovascular: Negative for chest pain and leg swelling.  Gastrointestinal: Negative for abdominal pain, blood in stool, constipation, diarrhea, nausea and vomiting.  Genitourinary: Positive for frequency. Negative for dysuria and hematuria.  Musculoskeletal: Negative for back pain, myalgias and neck pain.  Skin: Negative for rash and wound.  Neurological: Positive for weakness. Negative for dizziness, light-headedness, numbness and headaches.  All other systems reviewed and are negative.    Physical Exam Updated Vital Signs BP (!) 168/92   Pulse 79   Temp 98.8 F (37.1 C) (Oral)   Resp (!) 24   Ht 5\' 8"  (1.727 m)   Wt 58 kg   SpO2 98%   BMI 19.44 kg/m   Physical Exam Vitals signs and nursing note reviewed.  Constitutional:      General: She is not in acute distress.    Appearance: Normal  appearance. She is well-developed. She is not ill-appearing.  HENT:     Head: Normocephalic and atraumatic.     Nose: Nose normal.     Mouth/Throat:     Mouth: Mucous membranes are moist.  Eyes:     Pupils: Pupils are equal, round, and reactive to light.  Neck:     Musculoskeletal: Normal range of motion and neck supple.  Cardiovascular:     Rate and Rhythm: Normal rate and regular rhythm.     Heart sounds: No murmur. No friction rub. No gallop.   Pulmonary:     Effort: Pulmonary effort is normal.     Breath sounds: Normal breath sounds.     Comments: Mildly diminished breath sounds in the right base with few scattered crackles.  No respiratory distress. Abdominal:     General: Bowel sounds are normal.     Palpations: Abdomen is soft.     Tenderness: There is no abdominal tenderness. There is no right CVA tenderness, left CVA tenderness, guarding or rebound.  Musculoskeletal: Normal range of motion.        General: No swelling, tenderness, deformity or signs of injury.     Right lower leg: No edema.     Left lower leg: No edema.  Skin:    General: Skin is warm and dry.     Capillary Refill: Capillary refill takes less than 2 seconds.     Findings: No erythema or rash.     Comments: Patient with few scattered purpura to the bilateral hands and wrists.  Neurological:     General: No focal deficit present.     Mental Status: She is alert and oriented to person, place, and time.  Psychiatric:        Behavior: Behavior normal.      ED Treatments / Results  Labs (all labs ordered are listed, but only abnormal results are displayed) Labs Reviewed  CBC WITH DIFFERENTIAL/PLATELET - Abnormal; Notable for the following components:  Result Value   WBC 2.9 (*)    RBC 2.82 (*)    Hemoglobin 8.6 (*)    HCT 27.4 (*)    Lymphs Abs 0.4 (*)    All other components within normal limits  COMPREHENSIVE METABOLIC PANEL - Abnormal; Notable for the following components:   Potassium 5.8  (*)    CO2 20 (*)    Glucose, Bld 114 (*)    BUN 36 (*)    Creatinine, Ser 1.26 (*)    Total Protein 5.3 (*)    Albumin 3.0 (*)    ALT 52 (*)    GFR calc non Af Amer 43 (*)    GFR calc Af Amer 50 (*)    All other components within normal limits  URINALYSIS, ROUTINE W REFLEX MICROSCOPIC - Abnormal; Notable for the following components:   Hgb urine dipstick SMALL (*)    Protein, ur >=300 (*)    All other components within normal limits  POTASSIUM - Abnormal; Notable for the following components:   Potassium 5.6 (*)    All other components within normal limits    EKG EKG Interpretation  Date/Time:  Friday November 17 2018 15:04:26 EDT Ventricular Rate:  73 PR Interval:    QRS Duration: 81 QT Interval:  374 QTC Calculation: 413 R Axis:   77 Text Interpretation:  Sinus or ectopic atrial rhythm Short PR interval Confirmed by Julianne Rice [id number]) on 11/17/2018 4:43:44 PM   Radiology No results found.  Procedures Procedures (including critical care time)  Medications Ordered in ED Medications  sodium chloride 0.9 % bolus 500 mL (0 mLs Intravenous Stopped 11/17/18 2247)  clonazePAM (KLONOPIN) tablet 0.5 mg (0.5 mg Oral Given 11/17/18 2023)     Initial Impression / Assessment and Plan / ED Course  I have reviewed the triage vital signs and the nursing notes.  Pertinent labs & imaging results that were available during my care of the patient were reviewed by me and considered in my medical decision making (see chart for details).        Patient admits to taking her potassium replacement.  Advised to stop taking this.  Given IV fluids.  Will set up home health to evaluate patient needs at home.  This was relayed to patient and her brother.  Final Clinical Impressions(s) / ED Diagnoses   Final diagnoses:  Generalized weakness    ED Discharge Orders    None       Julianne Rice, MD 11/19/18 2224

## 2018-11-17 NOTE — Discharge Instructions (Signed)
Stop taking your potassium tablet.  Follow-up closely with your primary physician to have your labs checked.

## 2018-11-17 NOTE — ED Triage Notes (Addendum)
Tiffany [facility] from home, where she lives with her husband. Tiffany is cancer Tiffany- Tiffany recently started new chemo with last treatment 11/13/18 . Tiffany called EMS for generalized weakness for the past 3-4 days. Tiffany reports being able to take PO without vomiting. Tiffany denies CP/SOB. Denies fever at home. Tiffany states her oncologist warned her she might feel sick after the chemo treatments, "but I wanted to be sure." Tiffany reports her husband spoke with her oncologist this morning and was advised to be seen at the hospital for a check up. Tiffany AxOx4 in triage.  EMS VS: 122/60, HR 70, RR 18, 95% RA, 98.85F Temporal, CBG-127.

## 2018-11-17 NOTE — Telephone Encounter (Signed)
Message received from patient's husband stating that patient is having increased weakness, difficulty walking and has urinated on the floor twice d/t she is unable to get to the bathroom in time.  Call placed back to patient's husband and notified him per order of Dr. Marin Olp to take the patient to the ER now at [facility].  Pt.'s husband states that he will contact pt.'s brother to assist him in getting pt to the ER now.  He states that he does have chemo card and will bring it with them to the ER.

## 2018-11-20 ENCOUNTER — Telehealth: Payer: Self-pay | Admitting: *Deleted

## 2018-11-20 ENCOUNTER — Telehealth: Payer: Self-pay | Admitting: Internal Medicine

## 2018-11-20 NOTE — Telephone Encounter (Signed)
Hydralazine not expected to cause increase urination, but may worsen nausea associated with chemotherapy.  Also noted patient to see oncologist tomorrow for assessment of urinary incontinence.  Recommendation:  Please monitor BP twice daily and keep records  Start hydrated  Decrease hydralazine dose to 100mg  twice daily for the next 3 days, and take medication after meal or snack. Okay to go back to hydralazine 100mg  TID after 3 days if nausea improved.

## 2018-11-20 NOTE — Telephone Encounter (Signed)
New Message   Pt c/o medication issue:  1. Name of Medication: hydrALAZINE (APRESOLINE) 50 MG tablet  2. How are you currently taking this medication (dosage and times per day)? Take 2 tablets (100 mg total) by mouth 3 (three) times daily.  3. Are you having a reaction (difficulty breathing--STAT)? No  4. What is your medication issue? Since the increase in the dosage of the medication, patient has not been able to control her bladder. Patient is now having to wear depends.

## 2018-11-20 NOTE — Telephone Encounter (Signed)
Call received from patient's husband stating that pt remains weak, is having difficulty walking, has no control over her bladder, is having to wear depends now and would like direction from Dr. Marin Olp.  Dr. Marin Olp notified of patient's condition and would like to see patient tomorrow.  Call placed back to patient's husband to notify him that Dr. Marin Olp would like to see patient tomorrow. Husband is agreeable with MD plan. Patient's husband transferred to scheduling.

## 2018-11-20 NOTE — Telephone Encounter (Signed)
Spoke with pt who states that she has had urinary urge incontinence since her hydralazine was increased. Reviewed chart. Last OV was 7/29 and hydralazine was increased to 100 mg TID. She states she also has nausea and abdominal pain that she believes may have started after the increase in hydralazine but she is not sure.  Nurse also spoke with pt husband who does state that pt had chemotherapy recently. Advised him to have pt reach out to oncologist's office as well to discuss symptoms and informed him that message would be routed to pharmD to advise.

## 2018-11-20 NOTE — Telephone Encounter (Signed)
Husband notified of [place] message/comments he will decrease medication as advised and will mention medication questions to the [place] tomorrow. He will lo  Make a BP log with change in medication. He will CB if sx continue with medication reduction.

## 2018-11-21 ENCOUNTER — Inpatient Hospital Stay: Payer: Medicare Other

## 2018-11-21 ENCOUNTER — Other Ambulatory Visit: Payer: Self-pay | Admitting: *Deleted

## 2018-11-21 ENCOUNTER — Inpatient Hospital Stay: Payer: Medicare Other | Attending: Hematology & Oncology

## 2018-11-21 ENCOUNTER — Other Ambulatory Visit: Payer: Self-pay

## 2018-11-21 ENCOUNTER — Inpatient Hospital Stay (HOSPITAL_BASED_OUTPATIENT_CLINIC_OR_DEPARTMENT_OTHER): Payer: Medicare Other | Admitting: Hematology & Oncology

## 2018-11-21 ENCOUNTER — Encounter: Payer: Medicare Other | Admitting: Physical Therapy

## 2018-11-21 ENCOUNTER — Encounter: Payer: Self-pay | Admitting: Hematology & Oncology

## 2018-11-21 VITALS — BP 143/73 | HR 74 | Temp 97.5°F | Resp 18

## 2018-11-21 DIAGNOSIS — Z5112 Encounter for antineoplastic immunotherapy: Secondary | ICD-10-CM | POA: Insufficient documentation

## 2018-11-21 DIAGNOSIS — N183 Chronic kidney disease, stage 3 unspecified: Secondary | ICD-10-CM

## 2018-11-21 DIAGNOSIS — C880 Waldenstrom macroglobulinemia: Secondary | ICD-10-CM

## 2018-11-21 DIAGNOSIS — M069 Rheumatoid arthritis, unspecified: Secondary | ICD-10-CM | POA: Insufficient documentation

## 2018-11-21 DIAGNOSIS — N189 Chronic kidney disease, unspecified: Secondary | ICD-10-CM | POA: Diagnosis not present

## 2018-11-21 DIAGNOSIS — Z79899 Other long term (current) drug therapy: Secondary | ICD-10-CM | POA: Insufficient documentation

## 2018-11-21 DIAGNOSIS — F0390 Unspecified dementia without behavioral disturbance: Secondary | ICD-10-CM | POA: Insufficient documentation

## 2018-11-21 DIAGNOSIS — D631 Anemia in chronic kidney disease: Secondary | ICD-10-CM | POA: Diagnosis not present

## 2018-11-21 DIAGNOSIS — R531 Weakness: Secondary | ICD-10-CM | POA: Diagnosis not present

## 2018-11-21 DIAGNOSIS — R350 Frequency of micturition: Secondary | ICD-10-CM

## 2018-11-21 LAB — CBC WITH DIFFERENTIAL (CANCER CENTER ONLY)
Abs Immature Granulocytes: 0.04 10*3/uL (ref 0.00–0.07)
Basophils Absolute: 0 10*3/uL (ref 0.0–0.1)
Basophils Relative: 0 %
Eosinophils Absolute: 0 10*3/uL (ref 0.0–0.5)
Eosinophils Relative: 0 %
HCT: 25.8 % — ABNORMAL LOW (ref 36.0–46.0)
Hemoglobin: 8.3 g/dL — ABNORMAL LOW (ref 12.0–15.0)
Immature Granulocytes: 1 %
Lymphocytes Relative: 9 %
Lymphs Abs: 0.4 10*3/uL — ABNORMAL LOW (ref 0.7–4.0)
MCH: 31.1 pg (ref 26.0–34.0)
MCHC: 32.2 g/dL (ref 30.0–36.0)
MCV: 96.6 fL (ref 80.0–100.0)
Monocytes Absolute: 0.2 10*3/uL (ref 0.1–1.0)
Monocytes Relative: 4 %
Neutro Abs: 3.9 10*3/uL (ref 1.7–7.7)
Neutrophils Relative %: 86 %
Platelet Count: 322 10*3/uL (ref 150–400)
RBC: 2.67 MIL/uL — ABNORMAL LOW (ref 3.87–5.11)
RDW: 14.1 % (ref 11.5–15.5)
WBC Count: 4.5 10*3/uL (ref 4.0–10.5)
nRBC: 0 % (ref 0.0–0.2)

## 2018-11-21 LAB — CMP (CANCER CENTER ONLY)
ALT: 25 U/L (ref 0–44)
AST: 14 U/L — ABNORMAL LOW (ref 15–41)
Albumin: 3.1 g/dL — ABNORMAL LOW (ref 3.5–5.0)
Alkaline Phosphatase: 46 U/L (ref 38–126)
Anion gap: 10 (ref 5–15)
BUN: 34 mg/dL — ABNORMAL HIGH (ref 8–23)
CO2: 21 mmol/L — ABNORMAL LOW (ref 22–32)
Calcium: 8.3 mg/dL — ABNORMAL LOW (ref 8.9–10.3)
Chloride: 108 mmol/L (ref 98–111)
Creatinine: 1.48 mg/dL — ABNORMAL HIGH (ref 0.44–1.00)
GFR, Est AFR Am: 41 mL/min — ABNORMAL LOW (ref >60)
GFR, Estimated: 36 mL/min — ABNORMAL LOW (ref >60)
Glucose, Bld: 132 mg/dL — ABNORMAL HIGH (ref 70–99)
Potassium: 4.1 mmol/L (ref 3.5–5.1)
Sodium: 139 mmol/L (ref 135–145)
Total Bilirubin: 0.3 mg/dL (ref 0.3–1.2)
Total Protein: 4.9 g/dL — ABNORMAL LOW (ref 6.5–8.1)

## 2018-11-21 LAB — URINALYSIS, COMPLETE (UACMP) WITH MICROSCOPIC
Bilirubin Urine: NEGATIVE
Glucose, UA: NEGATIVE mg/dL
Ketones, ur: NEGATIVE mg/dL
Leukocytes,Ua: NEGATIVE
Nitrite: NEGATIVE
Protein, ur: 100 mg/dL — AB
Specific Gravity, Urine: >1.03 — ABNORMAL HIGH (ref 1.005–1.030)
pH: 5.5 (ref 5.0–8.0)

## 2018-11-21 LAB — PREPARE RBC (CROSSMATCH)

## 2018-11-21 LAB — ABO/RH: ABO/RH(D): O POS

## 2018-11-21 LAB — LACTATE DEHYDROGENASE: LDH: 252 U/L — ABNORMAL HIGH (ref 98–192)

## 2018-11-21 MED ORDER — HEPARIN SOD (PORK) LOCK FLUSH 100 UNIT/ML IV SOLN
500.0000 [IU] | Freq: Once | INTRAVENOUS | Status: AC
  Administered 2018-11-21: 500 [IU] via INTRAVENOUS
  Filled 2018-11-21: qty 5

## 2018-11-21 MED ORDER — SODIUM CHLORIDE 0.9% FLUSH
10.0000 mL | Freq: Once | INTRAVENOUS | Status: AC
  Administered 2018-11-21: 10 mL
  Filled 2018-11-21: qty 10

## 2018-11-21 MED ORDER — EPOETIN ALFA-EPBX 10000 UNIT/ML IJ SOLN
20000.0000 [IU] | Freq: Once | INTRAMUSCULAR | Status: AC
  Administered 2018-11-21: 20000 [IU] via SUBCUTANEOUS
  Filled 2018-11-21: qty 2

## 2018-11-21 NOTE — Patient Instructions (Signed)
Epoetin Alfa injection (Retacrit) What is this medicine? EPOETIN ALFA (e POE e tin AL fa) helps your body make more red blood cells. This medicine is used to treat anemia caused by chronic kidney disease, cancer chemotherapy, or HIV-therapy. It may also be used before surgery if you have anemia. This medicine may be used for other purposes; ask your health care provider or pharmacist if you have questions. COMMON BRAND NAME(S): Epogen, Procrit, Retacrit What should I tell my health care provider before I take this medicine? They need to know if you have any of these conditions:  cancer  heart disease  high blood pressure  history of blood clots  history of stroke  low levels of folate, iron, or vitamin B12 in the blood  seizures  an unusual or allergic reaction to erythropoietin, albumin, benzyl alcohol, hamster proteins, other medicines, foods, dyes, or preservatives  pregnant or trying to get pregnant  breast-feeding How should I use this medicine? This medicine is for injection into a vein or under the skin. It is usually given by a health care professional in a hospital or clinic setting. If you get this medicine at home, you will be taught how to prepare and give this medicine. Use exactly as directed. Take your medicine at regular intervals. Do not take your medicine more often than directed. It is important that you put your used needles and syringes in a special sharps container. Do not put them in a trash can. If you do not have a sharps container, call your pharmacist or healthcare provider to get one. A special MedGuide will be given to you by the pharmacist with each prescription and refill. Be sure to read this information carefully each time. Talk to your pediatrician regarding the use of this medicine in children. While this drug may be prescribed for selected conditions, precautions do apply. Overdosage: If you think you have taken too much of this medicine contact a  poison control center or emergency room at once. NOTE: This medicine is only for you. Do not share this medicine with others. What if I miss a dose? If you miss a dose, take it as soon as you can. If it is almost time for your next dose, take only that dose. Do not take double or extra doses. What may interact with this medicine? Interactions have not been studied. This list may not describe all possible interactions. Give your health care provider a list of all the medicines, herbs, non-prescription drugs, or dietary supplements you use. Also tell them if you smoke, drink alcohol, or use illegal drugs. Some items may interact with your medicine. What should I watch for while using this medicine? Your condition will be monitored carefully while you are receiving this medicine. You may need blood work done while you are taking this medicine. This medicine may cause a decrease in vitamin B6. You should make sure that you get enough vitamin B6 while you are taking this medicine. Discuss the foods you eat and the vitamins you take with your health care professional. What side effects may I notice from receiving this medicine? Side effects that you should report to your doctor or health care professional as soon as possible:  allergic reactions like skin rash, itching or hives, swelling of the face, lips, or tongue  seizures  signs and symptoms of a blood clot such as breathing problems; changes in vision; chest pain; severe, sudden headache; pain, swelling, warmth in the leg; trouble speaking; sudden numbness   or weakness of the face, arm or leg  signs and symptoms of a stroke like changes in vision; confusion; trouble speaking or understanding; severe headaches; sudden numbness or weakness of the face, arm or leg; trouble walking; dizziness; loss of balance or coordination Side effects that usually do not require medical attention (report to your doctor or health care professional if they continue  or are bothersome):  chills  cough  dizziness  fever  headaches  joint pain  muscle cramps  muscle pain  nausea, vomiting  pain, redness, or irritation at site where injected This list may not describe all possible side effects. Call your doctor for medical advice about side effects. You may report side effects to FDA at 1-800-FDA-1088. Where should I keep my medicine? Keep out of the reach of children. Store in a refrigerator between 2 and 8 degrees C (36 and 46 degrees F). Do not freeze or shake. Throw away any unused portion if using a single-dose vial. Multi-dose vials can be kept in the refrigerator for up to 21 days after the initial dose. Throw away unused medicine. NOTE: This sheet is a summary. It may not cover all possible information. If you have questions about this medicine, talk to your doctor, pharmacist, or health care provider.  2020 Elsevier/Gold Standard (2016-11-12 08:35:19)  

## 2018-11-21 NOTE — Progress Notes (Signed)
Hematology and Oncology Follow Up Visit  Tiffany Leon [id number] 1951/02/05 68 y.o. 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  She apparently is having some issues with urinary incontinence.  This seemed to happen when her Lasix was increased.  She says she just goes all the time.  She has no problems with her bowels.  She is on quite a few medications.  She is on several blood pressure medications.  She is weak.  She had to go the emergency room I think last Friday or so.  It does not look like she has a urinary tract infection from what she had done in the emergency room.  Her hemoglobin is only 8.3.  This bothers me.  I know she has some renal insufficiency.  She may have some intrinsic renal disease.  I am sure that at some point, she will have to see nephrology.  I think that she probably would benefit from a transfusion.  I really think that she would do better with her hemoglobin a lot higher.  I am going to give her a dose of Aranesp today.  Her appetite might be a little bit better.  Her brother comes in with her.  She does have a little bit of dementia.  She has had no problems with fever.  There is been no obvious bleeding.  She has had no vomiting.  There may be a little bit of nausea.  Overall, her performance status is ECOG 2.  She had her first cycle of Rituxan/Cytoxan about 10 days ago.  Medications:  Current Outpatient Medications:  .  aspirin 81 MG chewable tablet, Chew 1 tablet (81 mg total) by mouth daily., Disp: , Rfl:  .  atorvastatin (LIPITOR) 40 MG tablet, Take 1 tablet (40 mg total) by mouth daily at 6 PM., Disp: 90 tablet, Rfl: 3 .  carvedilol (COREG) 25 MG tablet, Take 1 tablet (25 mg  total) by mouth 2 (two) times daily with a meal., Disp: 60 tablet, Rfl: 11 .  clonazePAM (KLONOPIN) 0.5 MG tablet, TAKE ONE TABLET BY MOUTH TWICE DAILY, Disp: 180 tablet, Rfl: 0 .  clotrimazole (LOTRIMIN) 1 % cream, Apply topically 2 (two) times daily., Disp: 30 g, Rfl: 0 .  famotidine (PEPCID) 40 MG tablet, Take 1 tablet (40 mg total) by mouth 2 (two) times daily., Disp: 60 tablet, Rfl: 2 .  ferrous sulfate 325 (65 FE) MG tablet, Take 1 tablet (325 mg total) by mouth daily with breakfast., Disp: , Rfl: 3 .  fluconazole (DIFLUCAN) 100 MG tablet, Take 1 tablet (100 mg total) by mouth daily., Disp: 30 tablet, Rfl: 2 .  folic acid (FOLVITE) 1 MG tablet, Take 1 tablet (1 mg total) by mouth daily., Disp: , Rfl:  .  furosemide (LASIX) 40 MG tablet, Take 1 tablet (40 mg total) by mouth daily., Disp: 90 tablet, Rfl: 3 .  hydrALAZINE (APRESOLINE) 50 MG tablet, Take 2 tablets (100 mg total) by mouth 3 (three) times daily., Disp: 180 tablet, Rfl: 1 .  isosorbide mononitrate (IMDUR) 30 MG 24 hr tablet, Take 1 tablet (30 mg total) by mouth daily., Disp: 90 tablet, Rfl: 3 .  levothyroxine (SYNTHROID) 75 MCG tablet, Take 75 mcg by mouth daily.,  Disp: , Rfl:  .  lidocaine-prilocaine (EMLA) cream, Apply to affected area once, Disp: 30 g, Rfl: 3 .  megestrol (MEGACE) 400 MG/10ML suspension, Take 10 mLs (400 mg total) by mouth 2 (two) times daily., Disp: 480 mL, Rfl: 4 .  nicotine (NICODERM CQ - DOSED IN MG/24 HOURS) 21 mg/24hr patch, Place 1 patch (21 mg total) onto the skin daily., Disp: 28 patch, Rfl: 0 .  ondansetron (ZOFRAN) 8 MG tablet, Take 1 tablet (8 mg total) by mouth 2 (two) times daily as needed for refractory nausea / vomiting. Start on day 3 after chemotherapy., Disp: 30 tablet, Rfl: 1 .  PARoxetine (PAXIL) 40 MG tablet, Take 60 mg by mouth every morning., Disp: , Rfl:  .  potassium chloride (K-DUR) 10 MEQ tablet, Take 1 tablet (10 mEq total) by mouth daily., Disp: 90 tablet, Rfl: 3 .  predniSONE  (DELTASONE) 20 MG tablet, TAKE 1 TABLET BY MOUTH DAILY WITH BREAKFAST, Disp: 30 tablet, Rfl: 0 .  prochlorperazine (COMPAZINE) 10 MG tablet, Take 1 tablet (10 mg total) by mouth every 6 (six) hours as needed (Nausea or vomiting)., Disp: 30 tablet, Rfl: 1 .  traZODone (DESYREL) 50 MG tablet, Take 1-2 tablets (50-100 mg total) by mouth at bedtime., Disp: 180 tablet, Rfl: 1  Allergies:  Allergies  Allergen Reactions  . Codeine Nausea And Vomiting    Past Medical History, Surgical history, Social history, and Family History were reviewed and updated.  Review of Systems: Review of Systems  Constitutional: Positive for appetite change, fatigue and unexpected weight change.  HENT:   Positive for sore throat.   Eyes: Negative.   Respiratory: Positive for shortness of breath.   Cardiovascular: Negative.   Gastrointestinal: Positive for abdominal pain and nausea.  Endocrine: Negative.   Genitourinary: Negative.    Musculoskeletal: Positive for arthralgias and myalgias.  Skin: Negative.   Neurological: Positive for light-headedness.  Hematological: Negative.   Psychiatric/Behavioral: Negative.     Physical Exam:  oral temperature is 97.5 F (36.4 C) (abnormal). Her blood pressure is 143/73 (abnormal) and her pulse is 74. Her respiration is 18 and oxygen saturation is 100%.   Wt Readings from Last 3 Encounters:  11/17/18 127 lb 13.9 oz (58 kg)  11/15/18 128 lb (58.1 kg)  11/03/18 124 lb (56.2 kg)    Physical Exam Vitals signs reviewed.  HENT:     Head: Normocephalic and atraumatic.  Eyes:     Pupils: Pupils are equal, round, and reactive to light.  Neck:     Musculoskeletal: Normal range of motion.     Comments: On exam of her neck, there might be some adenopathy on the left neck.  This is not prominent.  However, there does seem to feel a few swollen lymph nodes.  I really cannot palpate any adenopathy on the right neck. Cardiovascular:     Rate and Rhythm: Normal rate and  regular rhythm.     Heart sounds: Normal heart sounds.  Pulmonary:     Effort: Pulmonary effort is normal.     Breath sounds: Normal breath sounds.  Abdominal:     General: Bowel sounds are normal.     Palpations: Abdomen is soft.  Musculoskeletal: Normal range of motion.        General: No tenderness or deformity.  Lymphadenopathy:     Cervical: No cervical adenopathy.  Skin:    Findings: No erythema or rash.  Neurological:     Mental Status: She is alert and oriented  to person, place, and time.  Psychiatric:        Behavior: Behavior normal.        Thought Content: Thought content normal.        Judgment: Judgment normal.      Lab Results  Component Value Date   WBC 4.5 11/21/2018   HGB 8.3 (L) 11/21/2018   HCT 25.8 (L) 11/21/2018   MCV 96.6 11/21/2018   PLT 322 11/21/2018     Chemistry      Component Value Date/Time   NA 139 11/17/2018 1515   K 5.6 (H) 11/17/2018 1820   CL 111 11/17/2018 1515   CO2 20 (L) 11/17/2018 1515   BUN 36 (H) 11/17/2018 1515   CREATININE 1.26 (H) 11/17/2018 1515   CREATININE 1.35 (H) 11/03/2018 1426      Component Value Date/Time   CALCIUM 9.0 11/17/2018 1515   ALKPHOS 49 11/17/2018 1515   AST 19 11/17/2018 1515   AST 18 11/03/2018 1426   ALT 52 (H) 11/17/2018 1515   ALT 41 11/03/2018 1426   BILITOT 0.5 11/17/2018 1515   BILITOT 0.3 11/03/2018 1426       Impression and Plan: Tiffany Leon is a 68 year old white female.  She has Waldenstrm's macroglobulinemia.  I think we will go ahead and start treating her for this.  Again, her labs all look all that bad.  Her BUN is 34 creatinine 1.48.  Her white cell count is 4.5.  She has not neutropenic.  We will send off a urine culture on her today.  I certainly think that there is more going on to her then just the Waldenstrom's.. I know she has a incredibly high rheumatoid factor level.  I suspect that she probably has some evidence of underlying kidney disease.  I does want her  quality of life to be better.  I will see about the blood transfusion tomorrow.  Hopefully, this will make her feel better.  I spent about 40 minutes with she and her brother today.  This was somewhat complicated.  Again she has a lot that is going on.     [name], MD 8/4/202010:49 AM

## 2018-11-22 ENCOUNTER — Ambulatory Visit (HOSPITAL_BASED_OUTPATIENT_CLINIC_OR_DEPARTMENT_OTHER)
Admission: RE | Admit: 2018-11-22 | Discharge: 2018-11-22 | Disposition: A | Discharge status: Home / Self Care | Payer: Medicare Other | Source: Ambulatory Visit | Attending: Hematology & Oncology | Admitting: Hematology & Oncology

## 2018-11-22 ENCOUNTER — Inpatient Hospital Stay: Payer: Medicare Other

## 2018-11-22 ENCOUNTER — Other Ambulatory Visit: Payer: Self-pay

## 2018-11-22 ENCOUNTER — Other Ambulatory Visit: Payer: Self-pay | Admitting: Hematology & Oncology

## 2018-11-22 VITALS — BP 151/75 | HR 79 | Temp 98.4°F | Resp 18

## 2018-11-22 DIAGNOSIS — N183 Chronic kidney disease, stage 3 unspecified: Secondary | ICD-10-CM

## 2018-11-22 DIAGNOSIS — C880 Waldenstrom macroglobulinemia: Secondary | ICD-10-CM | POA: Diagnosis not present

## 2018-11-22 DIAGNOSIS — Z5112 Encounter for antineoplastic immunotherapy: Secondary | ICD-10-CM | POA: Diagnosis not present

## 2018-11-22 DIAGNOSIS — D631 Anemia in chronic kidney disease: Secondary | ICD-10-CM

## 2018-11-22 DIAGNOSIS — N189 Chronic kidney disease, unspecified: Secondary | ICD-10-CM

## 2018-11-22 LAB — URINE CULTURE: Culture: NO GROWTH

## 2018-11-22 LAB — IGG, IGA, IGM
IgA: 59 mg/dL — ABNORMAL LOW (ref 87–352)
IgG (Immunoglobin G), Serum: 52 mg/dL — ABNORMAL LOW (ref 586–1602)
IgM (Immunoglobulin M), Srm: 362 mg/dL — ABNORMAL HIGH (ref 26–217)

## 2018-11-22 LAB — KAPPA/LAMBDA LIGHT CHAINS
Kappa free light chain: 87.2 mg/L — ABNORMAL HIGH (ref 3.3–19.4)
Kappa, lambda light chain ratio: 5.97 — ABNORMAL HIGH (ref 0.26–1.65)
Lambda free light chains: 14.6 mg/L (ref 5.7–26.3)

## 2018-11-22 MED ORDER — SODIUM CHLORIDE 0.9 % IV SOLN
INTRAVENOUS | Status: DC
  Administered 2018-11-22: 13:00:00 via INTRAVENOUS
  Filled 2018-11-22: qty 250

## 2018-11-22 MED ORDER — AMPICILLIN-SULBACTAM SODIUM 1.5 (1-0.5) G IJ SOLR: 1.5000 g | Freq: Once | INTRAMUSCULAR | Status: DC

## 2018-11-22 MED ORDER — MIRABEGRON ER 25 MG PO TB24: 25.0000 mg | ORAL_TABLET | Freq: Every day | ORAL | 3 refills | Status: DC

## 2018-11-22 MED ORDER — HEPARIN SOD (PORK) LOCK FLUSH 100 UNIT/ML IV SOLN
500.0000 [IU] | Freq: Once | INTRAVENOUS | Status: AC
  Administered 2018-11-22: 15:00:00 500 [IU] via INTRAVENOUS
  Filled 2018-11-22: qty 5

## 2018-11-22 MED ORDER — SODIUM CHLORIDE 0.9 % IV SOLN
1.5000 g | Freq: Once | INTRAVENOUS | Status: AC
  Administered 2018-11-22: 1.5 g via INTRAVENOUS
  Filled 2018-11-22: qty 4

## 2018-11-22 MED ORDER — SODIUM CHLORIDE 0.9% FLUSH
10.0000 mL | Freq: Once | INTRAVENOUS | Status: AC
  Administered 2018-11-22: 10 mL
  Filled 2018-11-22: qty 10

## 2018-11-22 MED ORDER — SODIUM CHLORIDE 0.9% IV SOLUTION
250.0000 mL | Freq: Once | INTRAVENOUS | Status: AC
  Administered 2018-11-22: 250 mL via INTRAVENOUS
  Filled 2018-11-22: qty 250

## 2018-11-22 MED ORDER — ACETAMINOPHEN 325 MG PO TABS
650.0000 mg | ORAL_TABLET | Freq: Once | ORAL | Status: AC
  Administered 2018-11-22: 650 mg via ORAL

## 2018-11-22 MED ORDER — ACETAMINOPHEN 325 MG PO TABS
ORAL_TABLET | ORAL | Status: AC
  Filled 2018-11-22: qty 2

## 2018-11-22 MED ORDER — AMOXICILLIN-POT CLAVULANATE 875-125 MG PO TABS: 1.0000 | ORAL_TABLET | Freq: Two times a day (BID) | ORAL | 0 refills | Status: DC

## 2018-11-22 NOTE — Progress Notes (Signed)
Patient complains of headache after sleeping. Patient request 2 tylenol for headache. Order given and carried out for Tylenol 650 mg PO.   1354 Patient reports headache is better.

## 2018-11-22 NOTE — Patient Instructions (Addendum)
Blood Transfusion, Adult  A blood transfusion is a procedure in which you receive donated blood, including plasma, platelets, and red blood cells, through an IV tube. You may need a blood transfusion because of illness, surgery, or injury. The blood may come from a donor. You may also be able to donate blood for yourself (autologous blood donation) before a surgery if you know that you might require a blood transfusion. The blood given in a transfusion is made up of different types of cells. You may receive:  Red blood cells. These carry oxygen to the cells in the body.  White blood cells. These help you fight infections.  Platelets. These help your blood to clot.  Plasma. This is the liquid part of your blood and it helps with fluid imbalances. If you have hemophilia or another clotting disorder, you may also receive other types of blood products. Tell a health care provider about:  Any allergies you have.  All medicines you are taking, including vitamins, herbs, eye drops, creams, and over-the-counter medicines.  Any problems you or family members have had with anesthetic medicines.  Any blood disorders you have.  Any surgeries you have had.  Any medical conditions you have, including any recent fever or cold symptoms.  Whether you are pregnant or may be pregnant.  Any previous reactions you have had during a blood transfusion. What are the risks? Generally, this is a safe procedure. However, problems may occur, including:  Having an allergic reaction to something in the donated blood. Hives and itching may be symptoms of this type of reaction.  Fever. This may be a reaction to the white blood cells in the transfused blood. Nausea or chest pain may accompany a fever.  Iron overload. This can happen from having many transfusions.  Transfusion-related acute lung injury (TRALI). This is a rare reaction that causes lung damage. The cause is not known.TRALI can occur within hours  of a transfusion or several days later.  Sudden (acute) or delayed hemolytic reactions. This happens if your blood does not match the cells in your transfusion. Your body's defense system (immune system) may try to attack the new cells. This complication is rare. The symptoms include fever, chills, nausea, and low back pain or chest pain.  Infection or disease transmission. This is rare. What happens before the procedure?  You will have a blood test to determine your blood type. This is necessary to know what kind of blood your body will accept and to match it to the donor blood.  If you are going to have a planned surgery, you may be able to do an autologous blood donation. This may be done in case you need to have a transfusion.  If you have had an allergic reaction to a transfusion in the past, you may be given medicine to help prevent a reaction. This medicine may be given to you by mouth or through an IV tube.  You will have your temperature, blood pressure, and pulse monitored before the transfusion.  Follow instructions from your health care provider about eating and drinking restrictions.  Ask your health care provider about: ? Changing or stopping your regular medicines. This is especially important if you are taking diabetes medicines or blood thinners. ? Taking medicines such as aspirin and ibuprofen. These medicines can thin your blood. Do not take these medicines before your procedure if your health care provider instructs you not to. What happens during the procedure?  An IV tube will be   inserted into one of your veins.  The bag of donated blood will be attached to your IV tube. The blood will then enter through your vein.  Your temperature, blood pressure, and pulse will be monitored regularly during the transfusion. This monitoring is done to detect early signs of a transfusion reaction.  If you have any signs or symptoms of a reaction, your transfusion will be stopped and  you may be given medicine.  When the transfusion is complete, your IV tube will be removed.  Pressure may be applied to the IV site for a few minutes.  A bandage (dressing) will be applied. The procedure may vary among health care providers and hospitals. What happens after the procedure?  Your temperature, blood pressure, heart rate, breathing rate, and blood oxygen level will be monitored often.  Your blood may be tested to see how you are responding to the transfusion.  You may be warmed with fluids or blankets to maintain a normal body temperature. Summary  A blood transfusion is a procedure in which you receive donated blood, including plasma, platelets, and red blood cells, through an IV tube.  Your temperature, blood pressure, and pulse will be monitored before, during, and after the transfusion.  Your blood may be tested after the transfusion to see how your body has responded. This information is not intended to replace advice given to you by your health care provider. Make sure you discuss any questions you have with your health care provider. Document Released: 04/02/2000 Document Revised: 02/20/2018 Document Reviewed: 01/01/2016 Elsevier Patient Education  [place].  Ampicillin; Sulbactam injection What is this medicine? AMPICILLIN; SULBACTAM (am pi SILL in; sul BAK tam) is a penicillin antibiotic. It is used to treat certain kinds of bacterial infections. It will not work for colds, flu, or other viral infections. This medicine may be used for other purposes; ask your health care provider or pharmacist if you have questions. COMMON BRAND NAME(S): Unasyn What should I tell my health care provider before I take this medicine? They need to know if you have any of these conditions:  heart disease  kidney disease  mononucleosis  an unusual or allergic reaction to ampicillin, other penicillins or antibiotics, foods, dyes, or preservatives  pregnant or trying  to get pregnant  breast-feeding How should I use this medicine? This medicine is infused into a vein or injected deep into a muscle. It is usually given by a health care professional in a hospital or clinic setting. If you get this medicine at home, you will be taught how to prepare and give this medicine. Use exactly as directed. Take your medicine at regular intervals. Do not take your medicine more often than directed. It is important that you put your used needles and syringes in a special sharps container. Do not put them in a trash can. If you do not have a sharps container, call your pharmacist or healthcare provider to get one. Talk to your pediatrician regarding the use of this medicine in children. Special care may be needed. Overdosage: If you think you have taken too much of this medicine contact a poison control center or emergency room at once. NOTE: This medicine is only for you. Do not share this medicine with others. What if I miss a dose? If you miss a dose, take it as soon as you can. If it is almost time for your next dose, take only that dose. Do not take double or extra doses. What may  interact with this medicine?  allopurinol  female hormones, including contraceptive or birth control pills  probenecid  some other antibiotics given by injection This list may not describe all possible interactions. Give your health care provider a list of all the medicines, herbs, non-prescription drugs, or dietary supplements you use. Also tell them if you smoke, drink alcohol, or use illegal drugs. Some items may interact with your medicine. What should I watch for while using this medicine? Tell your doctor or health care provider if your symptoms do not improve or if you get new symptoms. This medicine may cause serious skin reactions. They can happen weeks to months after starting the medicine. Contact your health care provider right away if you notice fevers or flu-like symptoms  with a rash. The rash may be red or purple and then turn into blisters or peeling of the skin. Or, you might notice a red rash with swelling of the face, lips or lymph nodes in your neck or under your arms. Do not treat diarrhea with over the counter products. Contact your doctor if you have diarrhea that lasts more than 2 days or if the diarrhea is severe and watery. This medicine can interfere with some urine glucose tests. If you use such tests, talk with your health care provider. Birth control pills may not work properly while you are taking this medicine. Talk to your doctor about using an extra method of birth control. What side effects may I notice from receiving this medicine? Side effects that you should report to your doctor or health care professional as soon as possible:  allergic reactions like skin rash or hives, swelling of the face, lips, or tongue  chest pain  difficulty breathing  fever, chills  pain or difficulty passing urine  redness, blistering, peeling or loosening of the skin, including inside the mouth  seizures  unusual bleeding, bruising  unusually weak or tired Side effects that usually do not require medical attention (report to your doctor or health care professional if they continue or are bothersome):  diarrhea  headache  heartburn  nausea, vomiting  pain, irritation at the site of injection  sore mouth, tongue  stomach gas This list may not describe all possible side effects. Call your doctor for medical advice about side effects. You may report side effects to FDA at 1-800-FDA-1088. Where should I keep my medicine? Keep out of the reach of children. You will be instructed on how to store this medicine. Throw away any unused medicine after the expiration date on the label. NOTE: This sheet is a summary. It may not cover all possible information. If you have questions about this medicine, talk to your doctor, pharmacist, or health care  provider.  2020 Elsevier/Gold Standard (2018-06-19 10:11:10)

## 2018-11-23 ENCOUNTER — Other Ambulatory Visit: Payer: Self-pay | Admitting: *Deleted

## 2018-11-23 ENCOUNTER — Telehealth: Payer: Self-pay | Admitting: *Deleted

## 2018-11-23 ENCOUNTER — Other Ambulatory Visit: Payer: Self-pay | Admitting: Hematology & Oncology

## 2018-11-23 ENCOUNTER — Encounter: Payer: Medicare Other | Admitting: Physical Therapy

## 2018-11-23 LAB — TYPE AND SCREEN
ABO/RH(D): O POS
Antibody Screen: NEGATIVE
Unit division: 0
Unit division: 0

## 2018-11-23 LAB — PROTEIN ELECTROPHORESIS, SERUM, WITH REFLEX
A/G Ratio: 1.5 (ref 0.7–1.7)
Albumin ELP: 2.9 g/dL (ref 2.9–4.4)
Alpha-1-Globulin: 0.3 g/dL (ref 0.0–0.4)
Alpha-2-Globulin: 1 g/dL (ref 0.4–1.0)
Beta Globulin: 0.5 g/dL — ABNORMAL LOW (ref 0.7–1.3)
Gamma Globulin: 0.3 g/dL — ABNORMAL LOW (ref 0.4–1.8)
Globulin, Total: 2 g/dL — ABNORMAL LOW (ref 2.2–3.9)
M-Spike, %: 0.1 g/dL — ABNORMAL HIGH
SPEP Interpretation: 0
Total Protein ELP: 4.9 g/dL — ABNORMAL LOW (ref 6.0–8.5)

## 2018-11-23 LAB — IMMUNOFIXATION REFLEX, SERUM
IgA: 57 mg/dL — ABNORMAL LOW (ref 87–352)
IgG (Immunoglobin G), Serum: 52 mg/dL — ABNORMAL LOW (ref 586–1602)
IgM (Immunoglobulin M), Srm: 349 mg/dL — ABNORMAL HIGH (ref 26–217)

## 2018-11-23 LAB — BPAM RBC
Blood Product Expiration Date: 202008282359
Blood Product Expiration Date: 202008292359
ISSUE DATE / TIME: 202008050738
ISSUE DATE / TIME: 202008050738
Unit Type and Rh: 5100
Unit Type and Rh: 5100

## 2018-11-23 MED ORDER — PREDNISONE 10 MG PO TABS: 10.0000 mg | ORAL_TABLET | Freq: Every day | ORAL | 0 refills | Status: DC

## 2018-11-23 NOTE — Telephone Encounter (Signed)
-----   Message from [name], MD sent at 11/23/2018  7:22 AM EDT ----- Call her brother -- no blockage.  Some constipation.  Laurey Arrow

## 2018-11-23 NOTE — Telephone Encounter (Signed)
Message received from patient's husband wanting to know if patient should continue Prednisone 20 mg.  Call placed back to patient's husband and notified him per order of Dr. Marin Olp to decrease Prednisone to 10 mg daily.  Teach back done.  Prescription for Prednisone 10 mg sent to [facility] on [address] per pt.'s husbands request.

## 2018-11-23 NOTE — Telephone Encounter (Signed)
Patient's brother Eddie Dibbles notified per order of Dr. Marin Olp that abd 2 view xray showed no blockage and some constipation.  Patient's brother appreciative of call and states that he will notify patient.  Eddie Dibbles states that constipation is not new for patient and that when she is constipated she takes stool softeners and he will let her know to take some today.

## 2018-11-24 ENCOUNTER — Telehealth: Payer: Self-pay | Admitting: *Deleted

## 2018-11-24 NOTE — Telephone Encounter (Signed)
Attempt to notify, unable to reach.

## 2018-11-24 NOTE — Telephone Encounter (Signed)
-----   Message from [name], MD sent at 11/23/2018  4:43 PM EDT ----- Call her brother -- the Waldenstrom's is down by 70% already!!!  This is fantastic!!  Laurey Arrow

## 2018-11-27 ENCOUNTER — Telehealth: Payer: Self-pay | Admitting: Internal Medicine

## 2018-11-27 ENCOUNTER — Telehealth: Payer: Self-pay | Admitting: *Deleted

## 2018-11-27 MED ORDER — AMLODIPINE BESYLATE 2.5 MG PO TABS: 2.5000 mg | ORAL_TABLET | Freq: Every day | ORAL | 1 refills | Status: DC

## 2018-11-27 NOTE — Telephone Encounter (Signed)
Called son back- he states that they increased to three times a day last Wednesday or Thursday, and the BP is still elevated.  How to proceed now that he has increased?

## 2018-11-27 NOTE — Telephone Encounter (Signed)
She reported urinary symptoms and nausea that she thought was due to hydralazine - dose decreased to 100 mg BID - if this was not the case, then can increase hydralazine back to 100 mg TID (morning, lunch and dinner).  Dr. Lemmie Evens

## 2018-11-27 NOTE — Telephone Encounter (Signed)
Recommend adding amlodipine 2.5 mg daily.  Dr. Lemmie Evens

## 2018-11-27 NOTE — Telephone Encounter (Signed)
Called and notified son- medication sent to pharmacy.  patient son will call back if issues.

## 2018-11-27 NOTE — Telephone Encounter (Signed)
Pt c/o BP issue: STAT if pt c/o blurred vision, one-sided weakness or slurred speech  1. What are your last 5 BP readings? 189/98 HR 80 this morning 628 am       185/107 HR 73 724 am after med       175/99 HR 78 yesterday      176/98 HR 81 Saturday      150/78 HR 80 Saturday evening 630pm      167/89 HR 70 Friday morning      168/109 HR 79 Friday evening   2. Are you having any other symptoms (ex. Dizziness, headache, blurred vision, passed out)? Stiffness in her joints.   3. What is your BP issue? Medication adjustment is not helping her. Was adjusted about a week ago.

## 2018-11-27 NOTE — Telephone Encounter (Signed)
Called patient- spoke with son- he states that his mothers BP is increasing.  Currently she is taking medications as prescribed on list- no changes.  BP is listed below. She only complaints of joint pain at this time, denies chest pain, SOB at rest (only when up walking does she notice changes in her breathing) patient does have swelling in her legs but they have been using compression stockings last week and it seemed to help, did not use over the weekend and swelling come back, so he will place back on today.   Please advise if any changes should be made to medications?  Thank you!

## 2018-11-27 NOTE — Telephone Encounter (Signed)
Message received from patient's husband concerned that patient's BP has been high.  Call placed back to patient's husband, Legrand Como and notified him to contact pt.'s cardiologist regarding elevated BP.  Legrand Como states that he will contact them now.

## 2018-11-28 ENCOUNTER — Encounter: Payer: Medicare Other | Admitting: Physical Therapy

## 2018-11-30 ENCOUNTER — Encounter: Payer: Medicare Other | Admitting: Physical Therapy

## 2018-12-01 ENCOUNTER — Inpatient Hospital Stay: Payer: Medicare Other

## 2018-12-01 ENCOUNTER — Encounter: Payer: Self-pay | Admitting: Hematology & Oncology

## 2018-12-01 ENCOUNTER — Other Ambulatory Visit: Payer: Self-pay | Admitting: *Deleted

## 2018-12-01 ENCOUNTER — Inpatient Hospital Stay (HOSPITAL_BASED_OUTPATIENT_CLINIC_OR_DEPARTMENT_OTHER): Payer: Medicare Other | Admitting: Hematology & Oncology

## 2018-12-01 ENCOUNTER — Telehealth: Payer: Self-pay | Admitting: Hematology & Oncology

## 2018-12-01 ENCOUNTER — Other Ambulatory Visit: Payer: Self-pay

## 2018-12-01 VITALS — BP 153/64 | HR 67 | Temp 97.3°F | Resp 19 | Wt 124.0 lb

## 2018-12-01 DIAGNOSIS — C8800 Waldenstrom macroglobulinemia not having achieved remission: Secondary | ICD-10-CM

## 2018-12-01 DIAGNOSIS — Z5112 Encounter for antineoplastic immunotherapy: Secondary | ICD-10-CM | POA: Diagnosis not present

## 2018-12-01 LAB — CMP (CANCER CENTER ONLY)
ALT: 22 U/L (ref 0–44)
AST: 17 U/L (ref 15–41)
Albumin: 3 g/dL — ABNORMAL LOW (ref 3.5–5.0)
Alkaline Phosphatase: 50 U/L (ref 38–126)
Anion gap: 7 (ref 5–15)
BUN: 27 mg/dL — ABNORMAL HIGH (ref 8–23)
CO2: 23 mmol/L (ref 22–32)
Calcium: 8.3 mg/dL — ABNORMAL LOW (ref 8.9–10.3)
Chloride: 115 mmol/L — ABNORMAL HIGH (ref 98–111)
Creatinine: 1.1 mg/dL — ABNORMAL HIGH (ref 0.44–1.00)
GFR, Est AFR Am: 59 mL/min — ABNORMAL LOW (ref >60)
GFR, Estimated: 51 mL/min — ABNORMAL LOW (ref >60)
Glucose, Bld: 94 mg/dL (ref 70–99)
Potassium: 4.5 mmol/L (ref 3.5–5.1)
Sodium: 145 mmol/L (ref 135–145)
Total Bilirubin: 0.4 mg/dL (ref 0.3–1.2)
Total Protein: 4.6 g/dL — ABNORMAL LOW (ref 6.5–8.1)

## 2018-12-01 LAB — CBC WITH DIFFERENTIAL (CANCER CENTER ONLY)
Abs Immature Granulocytes: 0.29 10*3/uL — ABNORMAL HIGH (ref 0.00–0.07)
Basophils Absolute: 0 10*3/uL (ref 0.0–0.1)
Basophils Relative: 1 %
Eosinophils Absolute: 0.1 10*3/uL (ref 0.0–0.5)
Eosinophils Relative: 2 %
HCT: 38.2 % (ref 36.0–46.0)
Hemoglobin: 12.3 g/dL (ref 12.0–15.0)
Immature Granulocytes: 7 %
Lymphocytes Relative: 19 %
Lymphs Abs: 0.8 10*3/uL (ref 0.7–4.0)
MCH: 31 pg (ref 26.0–34.0)
MCHC: 32.2 g/dL (ref 30.0–36.0)
MCV: 96.2 fL (ref 80.0–100.0)
Monocytes Absolute: 0.6 10*3/uL (ref 0.1–1.0)
Monocytes Relative: 13 %
Neutro Abs: 2.5 10*3/uL (ref 1.7–7.7)
Neutrophils Relative %: 58 %
Platelet Count: 147 10*3/uL — ABNORMAL LOW (ref 150–400)
RBC: 3.97 MIL/uL (ref 3.87–5.11)
RDW: 16.4 % — ABNORMAL HIGH (ref 11.5–15.5)
WBC Count: 4.2 10*3/uL (ref 4.0–10.5)
nRBC: 0 % (ref 0.0–0.2)

## 2018-12-01 MED ORDER — ACETAMINOPHEN 325 MG PO TABS
ORAL_TABLET | ORAL | Status: AC
  Filled 2018-12-01: qty 2

## 2018-12-01 MED ORDER — SODIUM CHLORIDE 0.9 % IV SOLN
Freq: Once | INTRAVENOUS | Status: AC
  Administered 2018-12-01: 12:00:00 via INTRAVENOUS
  Filled 2018-12-01: qty 250

## 2018-12-01 MED ORDER — DIPHENHYDRAMINE HCL 25 MG PO CAPS
ORAL_CAPSULE | ORAL | Status: AC
  Filled 2018-12-01: qty 2

## 2018-12-01 MED ORDER — SODIUM CHLORIDE 0.9% FLUSH
10.0000 mL | INTRAVENOUS | Status: DC | PRN
  Administered 2018-12-01: 10 mL
  Filled 2018-12-01: qty 10

## 2018-12-01 MED ORDER — ACETAMINOPHEN 325 MG PO TABS
650.0000 mg | ORAL_TABLET | Freq: Once | ORAL | Status: AC
  Administered 2018-12-01: 650 mg via ORAL

## 2018-12-01 MED ORDER — HEPARIN SOD (PORK) LOCK FLUSH 100 UNIT/ML IV SOLN
500.0000 [IU] | Freq: Once | INTRAVENOUS | Status: AC | PRN
  Administered 2018-12-01: 500 [IU]
  Filled 2018-12-01: qty 5

## 2018-12-01 MED ORDER — DIPHENHYDRAMINE HCL 25 MG PO CAPS
50.0000 mg | ORAL_CAPSULE | Freq: Once | ORAL | Status: AC
  Administered 2018-12-01: 50 mg via ORAL

## 2018-12-01 MED ORDER — SODIUM CHLORIDE 0.9 % IV SOLN
375.0000 mg/m2 | Freq: Once | INTRAVENOUS | Status: AC
  Administered 2018-12-01: 600 mg via INTRAVENOUS
  Filled 2018-12-01: qty 50

## 2018-12-01 NOTE — Progress Notes (Signed)
Hematology and Oncology Follow Up Visit  Tiffany Leon [id number] Apr 25, 1950 68 y.o. 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 Strength is back for follow-up.  We did go ahead and give her 2 units of blood a couple of weeks ago.  This is helped her immensely.  Her hemoglobin went from 8.3 up to 12.3.    She does feel better.  She still comes in a wheelchair.  I am not sure what else might be going on.  Her renal function is better.  Her creatinine is 1.1.  Her blood sugar levels are come down incredibly quickly.  Her M spike is now 0.1 g/dL.  Her IgM level is 350 mg/dL.  I am going to draw the Cytoxan now.  I really think that we can just use Rituxan on her.  She looks like she is eating a little bit better.  There is no nausea or vomiting.  I am checking her rheumatoid factor again.  I want to see if this is still high.  She has had no fever.  She has had some diarrhea but this is better.  She has had no problems with increased leg swelling.  She is on Lasix for some edema in her legs.  Currently, her performance status is ECOG 2.  Medications:  Current Outpatient Medications:  .  amLODipine (NORVASC) 2.5 MG tablet, Take 1 tablet (2.5 mg total) by mouth daily., Disp: 180 tablet, Rfl: 1 .  amoxicillin-clavulanate (AUGMENTIN) 875-125 MG tablet, Take 1 tablet by mouth 2 (two) times daily., Disp: 10 tablet, Rfl: 0 .  aspirin 81 MG chewable tablet, Chew 1 tablet (81 mg total) by mouth daily., Disp: , Rfl:  .  atorvastatin (LIPITOR) 40 MG tablet, Take 1 tablet (40 mg total) by mouth daily at 6 PM., Disp: 90 tablet, Rfl: 3 .  carvedilol (COREG) 25 MG tablet, Take 1 tablet (25 mg total) by mouth 2 (two) times daily with a meal.,  Disp: 60 tablet, Rfl: 11 .  clonazePAM (KLONOPIN) 0.5 MG tablet, TAKE ONE TABLET BY MOUTH TWICE DAILY, Disp: 180 tablet, Rfl: 0 .  clotrimazole (LOTRIMIN) 1 % cream, Apply topically 2 (two) times daily., Disp: 30 g, Rfl: 0 .  famotidine (PEPCID) 40 MG tablet, Take 1 tablet (40 mg total) by mouth 2 (two) times daily., Disp: 60 tablet, Rfl: 2 .  ferrous sulfate 325 (65 FE) MG tablet, Take 1 tablet (325 mg total) by mouth daily with breakfast., Disp: , Rfl: 3 .  fluconazole (DIFLUCAN) 100 MG tablet, Take 1 tablet (100 mg total) by mouth daily., Disp: 30 tablet, Rfl: 2 .  folic acid (FOLVITE) 1 MG tablet, Take 1 tablet (1 mg total) by mouth daily., Disp: , Rfl:  .  furosemide (LASIX) 40 MG tablet, Take 1 tablet (40 mg total) by mouth daily., Disp: 90 tablet, Rfl: 3 .  hydrALAZINE (APRESOLINE) 50 MG tablet, Take 2 tablets (100 mg total) by mouth 3 (three) times daily., Disp: 180 tablet, Rfl: 1 .  isosorbide mononitrate (IMDUR) 30 MG 24 hr tablet, Take 1 tablet (30 mg total) by mouth daily., Disp: 90 tablet, Rfl: 3 .  levothyroxine (SYNTHROID) 75 MCG tablet, Take 75 mcg by mouth daily., Disp: , Rfl:  .  lidocaine-prilocaine (EMLA) cream, Apply to affected area once, Disp: 30 g, Rfl: 3 .  megestrol (MEGACE) 400 MG/10ML suspension, Take 10 mLs (400 mg total) by mouth 2 (two) times daily., Disp: 480 mL, Rfl: 4 .  mirabegron ER (MYRBETRIQ) 25 MG TB24 tablet, Take 1 tablet (25 mg total) by mouth daily., Disp: 30 tablet, Rfl: 3 .  nicotine (NICODERM CQ - DOSED IN MG/24 HOURS) 21 mg/24hr patch, Place 1 patch (21 mg total) onto the skin daily., Disp: 28 patch, Rfl: 0 .  ondansetron (ZOFRAN) 8 MG tablet, Take 1 tablet (8 mg total) by mouth 2 (two) times daily as needed for refractory nausea / vomiting. Start on day 3 after chemotherapy., Disp: 30 tablet, Rfl: 1 .  PARoxetine (PAXIL) 40 MG tablet, Take 60 mg by mouth every morning., Disp: , Rfl:  .  potassium chloride (K-DUR) 10 MEQ tablet, Take 1 tablet (10 mEq  total) by mouth daily., Disp: 90 tablet, Rfl: 3 .  predniSONE (DELTASONE) 10 MG tablet, Take 1 tablet (10 mg total) by mouth daily with breakfast., Disp: 30 tablet, Rfl: 0 .  prochlorperazine (COMPAZINE) 10 MG tablet, Take 1 tablet (10 mg total) by mouth every 6 (six) hours as needed (Nausea or vomiting)., Disp: 30 tablet, Rfl: 1 .  traZODone (DESYREL) 50 MG tablet, Take 1-2 tablets (50-100 mg total) by mouth at bedtime., Disp: 180 tablet, Rfl: 1  Allergies:  Allergies  Allergen Reactions  . Codeine Nausea And Vomiting    Past Medical History, Surgical history, Social history, and Family History were reviewed and updated.  Review of Systems: Review of Systems  Constitutional: Positive for appetite change, fatigue and unexpected weight change.  HENT:   Positive for sore throat.   Eyes: Negative.   Respiratory: Positive for shortness of breath.   Cardiovascular: Negative.   Gastrointestinal: Positive for abdominal pain and nausea.  Endocrine: Negative.   Genitourinary: Negative.    Musculoskeletal: Positive for arthralgias and myalgias.  Skin: Negative.   Neurological: Positive for light-headedness.  Hematological: Negative.   Psychiatric/Behavioral: Negative.     Physical Exam:  weight is 124 lb (56.2 kg). Her temporal temperature is 97.3 F (36.3 C) (abnormal). Her blood pressure is 153/64 (abnormal) and her pulse is 67. Her respiration is 19 and oxygen saturation is 99%.   Wt Readings from Last 3 Encounters:  12/01/18 124 lb (56.2 kg)  11/17/18 127 lb 13.9 oz (58 kg)  11/15/18 128 lb (58.1 kg)    Physical Exam Vitals signs reviewed.  HENT:     Head: Normocephalic and atraumatic.  Eyes:     Pupils: Pupils are equal, round, and reactive to light.  Neck:     Musculoskeletal: Normal range of motion.     Comments: On exam of her neck, there might be some adenopathy on the left neck.  This is not prominent.  However, there does seem to feel a few swollen lymph nodes.  I  really cannot palpate any adenopathy on the right neck. Cardiovascular:     Rate and Rhythm: Normal rate and regular rhythm.     Heart sounds: Normal heart sounds.  Pulmonary:     Effort: Pulmonary effort is normal.     Breath sounds: Normal breath sounds.  Abdominal:     General: Bowel sounds are normal.     Palpations: Abdomen is soft.  Musculoskeletal: Normal range of motion.        General: No tenderness or deformity.  Lymphadenopathy:  Cervical: No cervical adenopathy.  Skin:    Findings: No erythema or rash.  Neurological:     Mental Status: She is alert and oriented to person, place, and time.  Psychiatric:        Behavior: Behavior normal.        Thought Content: Thought content normal.        Judgment: Judgment normal.      Lab Results  Component Value Date   WBC 4.2 12/01/2018   HGB 12.3 12/01/2018   HCT 38.2 12/01/2018   MCV 96.2 12/01/2018   PLT 147 (L) 12/01/2018     Chemistry      Component Value Date/Time   NA 145 12/01/2018 1035   K 4.5 12/01/2018 1035   CL 115 (H) 12/01/2018 1035   CO2 23 12/01/2018 1035   BUN 27 (H) 12/01/2018 1035   CREATININE 1.10 (H) 12/01/2018 1035      Component Value Date/Time   CALCIUM 8.3 (L) 12/01/2018 1035   ALKPHOS 50 12/01/2018 1035   AST 17 12/01/2018 1035   ALT 22 12/01/2018 1035   BILITOT 0.4 12/01/2018 1035       Impression and Plan: Ms. Bernat is a 68 year old white female.  She has Waldenstrm's macroglobulinemia.   Again, her labs have certainly improved in all aspects.  She is responding to the treatment for her macroglobulinemia.  He will be interesting to see what her rheumatoid factor level is.  Again, we are stopping the Cytoxan.  Hopefully, she will improve her performance status overall.  I will know if she needs any physical therapy.  I am just glad that her blood improved so much with the transfusion.  Has a lot that is going on.   I forgot to mention that she also got a dose of  Procrit.  I think this also was helpful.  Hopefully, we can get her back on a more normal schedule.  I spent about 30 minutes with her.  I had to adjust her protocol.  I had to review her labs.  Her brother was not with her because of the coronavirus restrictions.    [name], MD 8/14/202011:35 AM

## 2018-12-01 NOTE — Patient Instructions (Signed)
Rituximab injection What is this medicine? RITUXIMAB (ri TUX i mab) is a monoclonal antibody. It is used to treat certain types of cancer like non-Hodgkin lymphoma and chronic lymphocytic leukemia. It is also used to treat rheumatoid arthritis, granulomatosis with polyangiitis (or Wegener's granulomatosis), microscopic polyangiitis, and pemphigus vulgaris. This medicine may be used for other purposes; ask your health care provider or pharmacist if you have questions. COMMON BRAND NAME(S): Rituxan, RUXIENCE What should I tell my health care provider before I take this medicine? They need to know if you have any of these conditions:  heart disease  infection (especially a virus infection such as hepatitis B, chickenpox, cold sores, or herpes)  immune system problems  irregular heartbeat  kidney disease  low blood counts, like low white cell, platelet, or red cell counts  lung or breathing disease, like asthma  recently received or scheduled to receive a vaccine  an unusual or allergic reaction to rituximab, other medicines, foods, dyes, or preservatives  pregnant or trying to get pregnant  breast-feeding How should I use this medicine? This medicine is for infusion into a vein. It is administered in a hospital or clinic by a specially trained health care professional. A special MedGuide will be given to you by the pharmacist with each prescription and refill. Be sure to read this information carefully each time. Talk to your pediatrician regarding the use of this medicine in children. This medicine is not approved for use in children. Overdosage: If you think you have taken too much of this medicine contact a poison control center or emergency room at once. NOTE: This medicine is only for you. Do not share this medicine with others. What if I miss a dose? It is important not to miss a dose. Call your doctor or health care professional if you are unable to keep an appointment. What  may interact with this medicine?  cisplatin  live virus vaccines This list may not describe all possible interactions. Give your health care provider a list of all the medicines, herbs, non-prescription drugs, or dietary supplements you use. Also tell them if you smoke, drink alcohol, or use illegal drugs. Some items may interact with your medicine. What should I watch for while using this medicine? Your condition will be monitored carefully while you are receiving this medicine. You may need blood work done while you are taking this medicine. This medicine can cause serious allergic reactions. To reduce your risk you may need to take medicine before treatment with this medicine. Take your medicine as directed. In some patients, this medicine may cause a serious brain infection that may cause death. If you have any problems seeing, thinking, speaking, walking, or standing, tell your healthcare professional right away. If you cannot reach your healthcare professional, urgently seek other source of medical care. Call your doctor or health care professional for advice if you get a fever, chills or sore throat, or other symptoms of a cold or flu. Do not treat yourself. This drug decreases your body's ability to fight infections. Try to avoid being around people who are sick. Do not become pregnant while taking this medicine or for at least 12 months after stopping it. Women should inform their doctor if they wish to become pregnant or think they might be pregnant. There is a potential for serious side effects to an unborn child. Talk to your health care professional or pharmacist for more information. Do not breast-feed an infant while taking this medicine or for at   least 6 months after stopping it. What side effects may I notice from receiving this medicine? Side effects that you should report to your doctor or health care professional as soon as possible:  allergic reactions like skin rash, itching or  hives; swelling of the face, lips, or tongue  breathing problems  chest pain  changes in vision  diarrhea  headache with fever, neck stiffness, sensitivity to light, nausea, or confusion  fast, irregular heartbeat  loss of memory  low blood counts - this medicine may decrease the number of white blood cells, red blood cells and platelets. You may be at increased risk for infections and bleeding.  mouth sores  problems with balance, talking, or walking  redness, blistering, peeling or loosening of the skin, including inside the mouth  signs of infection - fever or chills, cough, sore throat, pain or difficulty passing urine  signs and symptoms of kidney injury like trouble passing urine or change in the amount of urine  signs and symptoms of liver injury like dark yellow or brown urine; general ill feeling or flu-like symptoms; light-colored stools; loss of appetite; nausea; right upper belly pain; unusually weak or tired; yellowing of the eyes or skin  signs and symptoms of low blood pressure like dizziness; feeling faint or lightheaded, falls; unusually weak or tired  stomach pain  swelling of the ankles, feet, hands  unusual bleeding or bruising  vomiting Side effects that usually do not require medical attention (report to your doctor or health care professional if they continue or are bothersome):  headache  joint pain  muscle cramps or muscle pain  nausea  tiredness This list may not describe all possible side effects. Call your doctor for medical advice about side effects. You may report side effects to FDA at 1-800-FDA-1088. Where should I keep my medicine? This drug is given in a hospital or clinic and will not be stored at home. NOTE: This sheet is a summary. It may not cover all possible information. If you have questions about this medicine, talk to your doctor, pharmacist, or health care provider.  2020 [employer] Standard (2018-05-17  22:01:36)  

## 2018-12-01 NOTE — Telephone Encounter (Signed)
Appointments previously scheduled per 8/14 los

## 2018-12-01 NOTE — Progress Notes (Signed)
I spoke with Dr. Marin Olp.  Okay to proceed without Hep B panel.  He will draw the panel today.

## 2018-12-02 LAB — HEPATITIS B SURFACE ANTIGEN: Hepatitis B Surface Ag: NEGATIVE

## 2018-12-02 LAB — HEPATITIS PANEL, ACUTE
HCV Ab: <0.1 s/co ratio (ref 0.0–0.9)
Hep A IgM: NEGATIVE
Hep B C IgM: NEGATIVE
Hepatitis B Surface Ag: NEGATIVE

## 2018-12-04 NOTE — Progress Notes (Deleted)
Cardiology Office Note:    Date:  12/04/2018   ID:  Tiffany Leon, DOB October 20, 1950, MRN [id number]  PCP:  Aretta Nip, MD  Cardiologist:  Pixie Casino, MD   Referring MD: Aretta Nip, MD   No chief complaint on file. ***  History of Present Illness:    Tiffany Leon is a 68 y.o. female with a history of smoking, emphysema, GERD, depression, hypothyroidism, and anxiety. She had no cardiac history prior to a recent hospitalization. In March, she began to have daily N/V/diarrhea, found to have AKI and was referred to nephrology and GI. She had significant fatigue   She presented to the ER after 2-3 weeks of progressively worsening dyspnea, lower extremity swelling, and new orthopnea. COVID negative. CT with moderate right pleural effusion. Echocardiogram with EF of 40-45%, diffuse hypokinesis, and trivial-small pericardial effusion.  She was mildly anemic with hemoglobin 9.0, and sed rate was elevated at 107. She underwent right thoracentesis with removal of 900cc transudative effusion.  She was seen by our service in consultation for heart failure on 09/20/18. In addition to a 74-month hx of N/V/D, she also had 2-4 week hx of dyspnea and LE edema. She reported a 1-year history of exertional chest tightness. She was diuresed. She was hypertensive in the 150-180s. Medical therapy was optimized and she was discharged on 09/24/18. Of note, she was referred to rheumatology for elevated rheumatoid factor. Unfortunately, she presented back to the ER on 09/27/18 with abdominal pain, emesis, and chest pain. She was admitted with acute CHF exacerbation. She had not been compliant on a low salt diet. CT abd/pelvis was unremarkable. She was again optimized and discharged on 10/04/18. In the interim, she has been battling uncontrolled BP. Anti-hypertensives have been titrated several times.  An ischemic evaluation was deferred at that time given her other ongoing problems. She has also been diagnosed with  Waldenstrom's macroglobulinemia in the setting of anemia, chronic renal insufficiency, and probably RA. She was started on chemotherapy with a palliative intent. She received a port-a-cath on 11/09/18. I last saw her on 11/15/18 and increased hydralazine to 100 mg TID. Through phone calls, Dr. Debara Pickett has added low dose norvasc.   She presents today for follow up. Her current regimen includes: - norvasc 2.5 mg daily - coreg 25 mg BID - hydralazine 100 mg TID - imdur 30 mg daily - lasix 40 mg daily - KDur 10 mEq daily - ASA 81 mg - lipitor 40 mg  BP log     Chronic systolic heart failure Echo with EF of 40-45%. Maintained on hydralazine and imdur (bidil too expensive); coreg, and low dose norvasc.  Titrate imdur vs norvasc?   Coronary artery calcification Per Dr. Debara Pickett, will defer ischemic evaluation at this time given her overall clinical picture.    Waldenstrom's macroglobulinemia  Port-a-cath placed. Chemotherapy with palliative goal, not curative. She is not a candidate for transplant. Follows with Dr. Marin Olp.     CKD stage III sCr 1.10 on 12/01/18, much improved from prior. Baseline appears to be 1.39.         Past Medical History:  Diagnosis Date   Acute kidney injury ([place])    a. 09/2018   Anxiety    Aortic atherosclerosis ([place])    a. 09/2018 noted on Chest CT.   Chronic combined systolic (congestive) and diastolic (congestive) heart failure ([place])    a. 09/2018 Echo: EF 45-50%, diff HK. Triv to small circumfirential pericardial eff.   Chronic DOE (  dyspnea on exertion)    Claudication ([place])    a. Bilat hip claudication since ~ 2019.   Depression    Dyspnea    Emphysema lung ([place])    Exertional angina ([place])    a. Ex angina since ~ 2019.   GERD (gastroesophageal reflux disease)    Goals of care, counseling/discussion 11/03/2018   Hiatal hernia    History of bronchitis    History of kidney stones    Hypothyroidism    Pleural effusion    a.  09/2018 s/p thoracentesis-->947ml.   Pleural effusion 09/2018   Rash 10/2018   Rash began Saturday with unknown reason.  MD seen Monday 10/23/18   Resting tremor    Tobacco abuse    Waldenstrom's macroglobulinemia ([place]) 11/03/2018    Past Surgical History:  Procedure Laterality Date   BREAST LUMPECTOMY WITH RADIOACTIVE SEED LOCALIZATION Left 12/28/2017   Procedure: BREAST LUMPECTOMY WITH RADIOACTIVE SEED LOCALIZATION;  Surgeon: Jovita Kussmaul, MD;  Location: [facility];  Service: General;  Laterality: Left;   DG THUMB RIGHT HAND ([employer] HX)     IR IMAGING GUIDED PORT INSERTION  11/09/2018   IR THORACENTESIS ASP PLEURAL SPACE W/IMG GUIDE  09/20/2018   KIDNEY STONE SURGERY      Current Medications: No outpatient medications have been marked as taking for the 12/06/18 encounter (Appointment) with Ledora Bottcher, [place].     Allergies:   Codeine   Social History   Socioeconomic History   Marital status: Married    Spouse name: Not on file   Number of children: Not on file   Years of education: Not on file   Highest education level: Not on file  Occupational History   Not on file  Social Needs   Financial resource strain: Not on file   Food insecurity    Worry: Not on file    Inability: Not on file   Transportation needs    Medical: Not on file    Non-medical: Not on file  Tobacco Use   Smoking status: Current Every Day Smoker    Packs/day: 0.25    Years: 40.00    Pack years: 10.00    Types: Cigarettes   Smokeless tobacco: Never Used   Tobacco comment: smoking 3-4 cigarettes/day  Substance and Sexual Activity   Alcohol use: Not Currently    Comment: 1-2 gl wine / night - none since 07/2018   Drug use: Not Currently    Comment: prev used drugs ~ 35 yrs ago.   Sexual activity: Not on file  Lifestyle   Physical activity    Days per week: Not on file    Minutes per session: Not on file   Stress: Not on file  Relationships   Social connections    Talks  on phone: Not on file    Gets together: Not on file    Attends religious service: Not on file    Active member of club or organization: Not on file    Attends meetings of clubs or organizations: Not on file    Relationship status: Not on file  Other Topics Concern   Not on file  Social History Narrative   Lives in [place] with her husband.  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     Family History: The patient's ***family history includes Addison's disease in her sister; CAD in her brother; Dementia in her mother; Hypertension in her brother and brother; Lung cancer in her father; Stroke in her  mother.  ROS:   Please see the history of present illness.    *** All other systems reviewed and are negative.  EKGs/Labs/Other Studies Reviewed:    The following studies were reviewed today: ***  EKG:  EKG is *** ordered today.  The ekg ordered today demonstrates ***  Recent Labs: 09/20/2018: TSH 5.145 09/28/2018: B Natriuretic Peptide 224.7 12/01/2018: ALT 22; BUN 27; Creatinine 1.10; Hemoglobin 12.3; Platelet Count 147; Potassium 4.5; Sodium 145  Recent Lipid Panel    Component Value Date/Time   CHOL 121 09/28/2018 0328   TRIG 102 09/28/2018 0328   HDL 29 (L) 09/28/2018 0328   CHOLHDL 4.2 09/28/2018 0328   VLDL 20 09/28/2018 0328   LDLCALC 72 09/28/2018 0328    Physical Exam:    VS:  There were no vitals taken for this visit.    Wt Readings from Last 3 Encounters:  12/01/18 124 lb (56.2 kg)  11/17/18 127 lb 13.9 oz (58 kg)  11/15/18 128 lb (58.1 kg)     GEN: *** Well nourished, well developed in no acute distress HEENT: Normal NECK: No JVD; No carotid bruits LYMPHATICS: No lymphadenopathy CARDIAC: ***RRR, no murmurs, rubs, gallops RESPIRATORY:  Clear to auscultation without rales, wheezing or rhonchi  ABDOMEN: Soft, non-tender, non-distended MUSCULOSKELETAL:  No edema; No deformity  SKIN: Warm and dry NEUROLOGIC:  Alert and oriented x 3 PSYCHIATRIC:  Normal  affect   ASSESSMENT:    No diagnosis found. PLAN:    In order of problems listed above:  No diagnosis found.   Medication Adjustments/Labs and Tests Ordered: Current medicines are reviewed at length with the patient today.  Concerns regarding medicines are outlined above.  No orders of the defined types were placed in this encounter.  No orders of the defined types were placed in this encounter.   Signed, Ledora Bottcher, PA  12/04/2018 6:59 PM    [facility]

## 2018-12-06 ENCOUNTER — Ambulatory Visit: Payer: Medicare Other | Admitting: Physician Assistant

## 2018-12-08 ENCOUNTER — Other Ambulatory Visit: Payer: Self-pay

## 2018-12-08 MED ORDER — HYDRALAZINE HCL 50 MG PO TABS: 100.0000 mg | ORAL_TABLET | Freq: Three times a day (TID) | ORAL | 1 refills | Status: DC

## 2018-12-14 ENCOUNTER — Other Ambulatory Visit: Payer: Self-pay | Admitting: *Deleted

## 2018-12-14 DIAGNOSIS — C880 Waldenstrom macroglobulinemia: Secondary | ICD-10-CM

## 2018-12-14 MED ORDER — PROCHLORPERAZINE MALEATE 10 MG PO TABS: 10.0000 mg | ORAL_TABLET | Freq: Four times a day (QID) | ORAL | 1 refills | Status: DC | PRN

## 2018-12-19 ENCOUNTER — Other Ambulatory Visit: Payer: Self-pay | Admitting: Hematology & Oncology

## 2018-12-20 ENCOUNTER — Ambulatory Visit (INDEPENDENT_AMBULATORY_CARE_PROVIDER_SITE_OTHER): Payer: Medicare Other | Admitting: Psychiatry

## 2018-12-20 ENCOUNTER — Encounter: Payer: Self-pay | Admitting: Psychiatry

## 2018-12-20 ENCOUNTER — Other Ambulatory Visit: Payer: Self-pay | Admitting: Hematology & Oncology

## 2018-12-20 ENCOUNTER — Other Ambulatory Visit: Payer: Self-pay

## 2018-12-20 DIAGNOSIS — F5105 Insomnia due to other mental disorder: Secondary | ICD-10-CM

## 2018-12-20 DIAGNOSIS — F330 Major depressive disorder, recurrent, mild: Secondary | ICD-10-CM | POA: Diagnosis not present

## 2018-12-20 DIAGNOSIS — F4001 Agoraphobia with panic disorder: Secondary | ICD-10-CM

## 2018-12-20 NOTE — Progress Notes (Signed)
Cardiology Office Note:    Date:  12/28/2018   ID:  Tiffany Leon, DOB Jul 27, 1950, MRN [id number]  PCP:  Aretta Nip, MD  Cardiologist:  Pixie Casino, MD   Referring MD: Aretta Nip, MD   Chief Complaint  Patient presents with   Follow-up    CHF, HTN    History of Present Illness:    Tiffany Leon is a 68 y.o. female with a history of smoking, emphysema, GERD, depression, hypothyroidism, and anxiety. She had no cardiac history prior to a recent hospitalization. In March, she began to have daily N/V/diarrhea, found to have AKI and was referred to nephrology and GI. She had significant fatigue   She presented to the ER after 2-3 weeks of progressively worsening dyspnea, lower extremity swelling, and new orthopnea. COVID negative. CT with moderate right pleural effusion. Echocardiogram with EF of 40-45%, diffuse hypokinesis, and trivial-small pericardial effusion.  She was mildly anemic with hemoglobin 9.0, and sed rate was elevated at 107. She underwent right thoracentesis with removal of 900cc transudative effusion.  She was seen by our service in consultation for heart failure on 09/20/18. In addition to a 93-month hx of N/V/D, she also had 2-4 week hx of dyspnea and LE edema. She reported a 1-year history of exertional chest tightness. She was diuresed. She was hypertensive in the 150-180s. Medical therapy was optimized and she was discharged on 09/24/18. Of note, she was referred to rheumatology for elevated rheumatoid factor. Unfortunately, she presented back to the ER on 09/27/18 with abdominal pain, emesis, and chest pain. She was admitted with acute CHF exacerbation. She had not been compliant on a low salt diet. CT abd/pelvis was unremarkable. She was again optimized and discharged on 10/04/18. In the interim, she has been battling uncontrolled BP. Anti-hypertensives have been titrated several times.  An ischemic evaluation was deferred at that time given her other ongoing problems.  She has also been diagnosed with Waldenstrom's macroglobulinemia in the setting of anemia, chronic renal insufficiency, and probably RA. She was started on chemotherapy with a palliative intent. She received a port-a-cath on 11/09/18. I last saw her on 11/15/18 and increased hydralazine to 100 mg TID. Through phone calls, Dr. Debara Pickett has added low dose norvasc.   She presents today for follow up. Her current regimen includes: - norvasc 2.5 mg daily - coreg 25 mg BID - hydralazine 100 mg TID - will need confirmation on this - imdur 30 mg daily - lasix  mg daily, now 20 mg daily - KDur 10 mEq daily - ASA 81 mg - lipitor 40 mg  BP log shows systolic BP in the [id number]. However, after further questioning, her husband helps her take her BP in the morning before he leaves for work, which is only about 10 min after taking her BP medications. Her BP is controlled here today. I will not make medication changes based on her log and suggesting taking her BP in the evening instead. They were unsure on her dosage of hydralazine and will call back with her correct med list.  Overall, she is doing well considering her current problems. She reports increase in lower extremity swelling for the last month. Oncology decreased her lasix from 40 mg daily to 20 mg daily because of significant incontinence. She denies SOB and orthopnea, but does report DOE. I think this is related to deconditioning. She also reports chest tightness when she feels anxious. She has CP approximately every other day, but she has a  significant anxiety. She takes klonopin PRN and it relieves her chest pain when she takes it. She describes the chest pain as a tightness, associated with anxiety, no other associated symptoms, rated as a 6/10, is constant for 30 minutes, not exertional, and is relieved with klonopin. She is quite deconditioned. She does not walk outside of the house or do work inside the house. She is now feeling a little better. We  discussed trying to walk to the mailbox once daily for now to try to increase her endurance.   Past Medical History:  Diagnosis Date   Acute kidney injury ([place])    a. 09/2018   Anxiety    Aortic atherosclerosis ([place])    a. 09/2018 noted on Chest CT.   Chronic combined systolic (congestive) and diastolic (congestive) heart failure ([place])    a. 09/2018 Echo: EF 45-50%, diff HK. Triv to small circumfirential pericardial eff.   Chronic DOE (dyspnea on exertion)    Claudication ([place])    a. Bilat hip claudication since ~ 2019.   Depression    Dyspnea    Emphysema lung ([place])    Exertional angina ([place])    a. Ex angina since ~ 2019.   GERD (gastroesophageal reflux disease)    Goals of care, counseling/discussion 11/03/2018   Hiatal hernia    History of bronchitis    History of kidney stones    Hypothyroidism    Pleural effusion    a. 09/2018 s/p thoracentesis-->969ml.   Pleural effusion 09/2018   Rash 10/2018   Rash began Saturday with unknown reason.  MD seen Monday 10/23/18   Resting tremor    Tobacco abuse    Waldenstrom's macroglobulinemia ([place]) 11/03/2018    Past Surgical History:  Procedure Laterality Date   BREAST LUMPECTOMY WITH RADIOACTIVE SEED LOCALIZATION Left 12/28/2017   Procedure: BREAST LUMPECTOMY WITH RADIOACTIVE SEED LOCALIZATION;  Surgeon: Autumn Messing III, MD;  Location: [facility];  Service: General;  Laterality: Left;   DG THUMB RIGHT HAND ([facility] HX)     IR IMAGING GUIDED PORT INSERTION  11/09/2018   IR THORACENTESIS ASP PLEURAL SPACE W/IMG GUIDE  09/20/2018   KIDNEY STONE SURGERY      Current Medications: Current Meds  Medication Sig   amLODipine (NORVASC) 2.5 MG tablet Take 1 tablet (2.5 mg total) by mouth daily.   aspirin 81 MG chewable tablet Chew 1 tablet (81 mg total) by mouth daily.   atorvastatin (LIPITOR) 40 MG tablet Take 1 tablet (40 mg total) by mouth daily at 6 PM.   carvedilol (COREG) 25 MG tablet Take 1 tablet (25 mg total) by  mouth 2 (two) times daily with a meal.   clonazePAM (KLONOPIN) 0.5 MG tablet TAKE ONE TABLET BY MOUTH TWICE DAILY   clotrimazole (LOTRIMIN) 1 % cream Apply topically 2 (two) times daily.   famotidine (PEPCID) 40 MG tablet Take 1 tablet (40 mg total) by mouth 2 (two) times daily.   ferrous sulfate 325 (65 FE) MG tablet Take 1 tablet (325 mg total) by mouth daily with breakfast.   folic acid (FOLVITE) 1 MG tablet Take 1 tablet (1 mg total) by mouth daily.   hydrALAZINE (APRESOLINE) 50 MG tablet Take 2 tablets (100 mg total) by mouth 3 (three) times daily.   isosorbide mononitrate (IMDUR) 30 MG 24 hr tablet Take 1 tablet (30 mg total) by mouth daily.   levothyroxine (SYNTHROID) 75 MCG tablet Take 75 mcg by mouth daily.   lidocaine-prilocaine (EMLA) cream Apply to affected area  once   mirabegron ER (MYRBETRIQ) 25 MG TB24 tablet Take 1 tablet (25 mg total) by mouth daily.   nicotine (NICODERM CQ - DOSED IN MG/24 HOURS) 21 mg/24hr patch Place 1 patch (21 mg total) onto the skin daily.   ondansetron (ZOFRAN) 8 MG tablet Take 1 tablet (8 mg total) by mouth 2 (two) times daily as needed for refractory nausea / vomiting. Start on day 3 after chemotherapy.   PARoxetine (PAXIL) 40 MG tablet Take 60 mg by mouth every morning.   potassium chloride (K-DUR) 10 MEQ tablet Take 1 tablet (10 mEq total) by mouth daily.   predniSONE (DELTASONE) 10 MG tablet TAKE 1 TABLET (10 MG TOTAL) BY MOUTH DAILY WITH BREAKFAST.   prochlorperazine (COMPAZINE) 10 MG tablet Take 1 tablet (10 mg total) by mouth every 6 (six) hours as needed (Nausea or vomiting).   traZODone (DESYREL) 50 MG tablet Take 1-2 tablets (50-100 mg total) by mouth at bedtime.   [DISCONTINUED] furosemide (LASIX) 40 MG tablet Take 1 tablet (40 mg total) by mouth daily. (Patient taking differently: Take 20 mg by mouth daily. )   [DISCONTINUED] furosemide (LASIX) 40 MG tablet Take 1/2 tablet (20 mg) by mouth in the morning and 1/2 tablet at  lunch time.     Allergies:   Codeine   Social History   Socioeconomic History   Marital status: Married    Spouse name: Not on file   Number of children: Not on file   Years of education: Not on file   Highest education level: Not on file  Occupational History   Not on file  Social Needs   Financial resource strain: Not on file   Food insecurity    Worry: Not on file    Inability: Not on file   Transportation needs    Medical: Not on file    Non-medical: Not on file  Tobacco Use   Smoking status: Current Every Day Smoker    Packs/day: 0.25    Years: 40.00    Pack years: 10.00    Types: Cigarettes   Smokeless tobacco: Never Used   Tobacco comment: smoking 3-4 cigarettes/day  Substance and Sexual Activity   Alcohol use: Not Currently    Comment: 1-2 gl wine / night - none since 07/2018   Drug use: Not Currently    Comment: prev used drugs ~ 35 yrs ago.   Sexual activity: Not on file  Lifestyle   Physical activity    Days per week: Not on file    Minutes per session: Not on file   Stress: Not on file  Relationships   Social connections    Talks on phone: Not on file    Gets together: Not on file    Attends religious service: Not on file    Active member of club or organization: Not on file    Attends meetings of clubs or organizations: Not on file    Relationship status: Not on file  Other Topics Concern   Not on file  Social History Narrative   Lives in [place] with her husband.  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     Family History: The patient's family history includes Addison's disease in her sister; CAD in her brother; Dementia in her mother; Hypertension in her brother and brother; Lung cancer in her father; Stroke in her mother.  ROS:   Please see the history of present illness.    All other systems reviewed and are negative.  EKGs/Labs/Other Studies Reviewed:    The following studies were reviewed today:  Echo  09/20/18:  1. 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 The cavity size was normal. Left ventricular diastolic function could not be evaluated secondary to atrial fibrillation. Elevated left ventricular  end-diastolic pressure Left ventricular diffuse hypokinesis.  2. The right ventricle has normal systolic function. The cavity was normal. There is no increase in right ventricular wall thickness. Right ventricular systolic pressure could not be assessed  3. Trivial to small circumferential pericardial effusion is present with prominent fat pad over the RV.  4. The aortic valve was not well visualized. Aortic valve regurgitation was not assessed by color flow Doppler.  5. The inferior vena cava was normal in size with <50% respiratory variability.  6. The interatrial septum appears to be lipomatous.  EKG:  EKG is  ordered today.  The ekg ordered today demonstrates sinus rhythm, HR 67  Recent Labs: 09/20/2018: TSH 5.145 09/28/2018: B Natriuretic Peptide 224.7 12/22/2018: ALT 22; BUN 24; Creatinine 1.08; Hemoglobin 10.1; Platelet Count 239; Potassium 4.2; Sodium 141  Recent Lipid Panel    Component Value Date/Time   CHOL 121 09/28/2018 0328   TRIG 102 09/28/2018 0328   HDL 29 (L) 09/28/2018 0328   CHOLHDL 4.2 09/28/2018 0328   VLDL 20 09/28/2018 0328   LDLCALC 72 09/28/2018 0328    Physical Exam:    VS:  BP 130/71    Pulse 74    Ht 5\' 6"  (1.676 m)    Wt 123 lb (55.8 kg)    BMI 19.85 kg/m     Wt Readings from Last 3 Encounters:  12/28/18 123 lb (55.8 kg)  12/22/18 121 lb (54.9 kg)  12/01/18 124 lb (56.2 kg)     GEN: elderly female in no acute distress HEENT: Normal NECK: No JVD; No carotid bruits CARDIAC: RRR, no murmurs, rubs, gallops RESPIRATORY:  Clear to auscultation without rales, wheezing or rhonchi  ABDOMEN: Soft, non-tender, non-distended MUSCULOSKELETAL:  1+ B LE edema; No deformity  SKIN: Warm and dry NEUROLOGIC:  Alert  and oriented x 3 PSYCHIATRIC:  Normal affect   ASSESSMENT:    1. Chronic systolic CHF (congestive heart failure) ([place])   2. CKD (chronic kidney disease), stage III ([place])   3. Hyperlipidemia, unspecified hyperlipidemia type   4. Dyspnea on exertion   5. Lower extremity edema    PLAN:    In order of problems listed above:  Chronic systolic heart failure DOE Lower extremity edema Echo with EF of 40-45%. Maintained on hydralazine and imdur (bidil too expensive); coreg, and low dose norvasc. Her lasix was decreased to 20 mg daily about one month ago that coincides with her increased leg edema. She had significant urinary incontinence on the 40 mg lasix. We discussed increasing to 20 mg lasix twice daily to see if this helps her edema without significant incontinence. She is using compression socks. I think her DOE is related to deconditioning. She plans to work on slowly increasing her activity around the house.    Coronary artery calcification Dr. Debara Pickett previously deferred ischemic evaluation given her overall clinical picture. I think she is doing better and a little stronger after starting chemo. However, they are still waiting for rheumatology appt. Oncology reported that she would be able to tolerate an ischemic evaluation now. Today, she reports atypical chest pain that is not exertional and is relieved with klonopin. EKG today was nonischemic. I will  continue to defer for now given her renal function and overall clinical picture. She will follow up with Dr. Debara Pickett to reassess this.    Waldenstrom's macroglobulinemia  Port-a-cath placed. Chemotherapy with palliative goal, not curative. She is not a candidate for transplant. Follows with Dr. Marin Olp.  Suspect RA. They are very frustrated that they have not been able to get an appt with rheumatology. We will try to facilitate this.    CKD stage III sCr 1.08 on 12/22/18. Baseline appears to be 1.39. Lasix as above.   Follow up with Dr.  Debara Pickett.   Medication Adjustments/Labs and Tests Ordered: Current medicines are reviewed at length with the patient today.  Concerns regarding medicines are outlined above.  Orders Placed This Encounter  Procedures   EKG 12-Lead   Meds ordered this encounter  Medications   DISCONTD: furosemide (LASIX) 40 MG tablet    Sig: Take 1/2 tablet (20 mg) by mouth in the morning and 1/2 tablet at lunch time.    Dispense:  90 tablet    Refill:  1    Signed, Tiffany Leon, [place]  12/28/2018 12:18 PM    [facility]

## 2018-12-20 NOTE — Progress Notes (Signed)
Tiffany Leon [id number] [birth year] 68 y.o.  Subjective:   Patient ID:  Tiffany Leon is a 68 y.o. (DOB [birth year]) female.  Chief Complaint:  Chief Complaint  Patient presents with  . Follow-up    Medication Management  . Anxiety    Medication Management  . Depression    Medication Management    Anxiety Symptoms include dizziness. Patient reports no confusion, decreased concentration, nervous/anxious behavior or suicidal ideas.    Depression        Associated symptoms include fatigue and appetite change.  Associated symptoms include no decreased concentration and no suicidal ideas.  Past medical history includes anxiety.    Tiffany Leon presents to the office today for follow-up of depression and anxiety.  Patient last seen in January 2020.  She was experiencing some depression.  She had previously benefited by the addition of Wellbutrin XL 300 mg daily and that was restarted per her request.  Weaker on the left side in legs.   Dx Waldenstrom's macroglobunemia, anemia, CKD.  Dx about end of May. Started Wellbutrin after the last visit and it gave her death thoughts and insomnia and stopped.  Took it briefly.  Feels she's handling the stress OK.  Worry over htn which has been hard to control.  Tired and weak.  Losing weight.  Has PT.  Getting chemotx.  Tendency to seasonal depression.  Pt reports that mood is Anxious and Depressed and describes anxiety as Minimal. Anxiety symptoms include: Excessive Worry,.Maybe mild panic bc chest hurts at night and fears MI.  Pt reports no sleep issues and naps daily. Still needs trazodone.   Not as excessive as in the past. Pt reports that appetite is poor with 27# lost. Pt reports that energy is poor but retains some  interest or pleasure in usual activities, poor motivation and withdrawn from usual activities. Concentration is down slightly. Suicidal thoughts:  denied by patient.  Past Psychiatric Medication Trials: Wellbutrin SE,  paroxetine,  Review of Systems:  Review of Systems  Constitutional: Positive for appetite change and fatigue.  Neurological: Positive for dizziness and weakness. Negative for tremors.  Psychiatric/Behavioral: Positive for depression and dysphoric mood. Negative for agitation, behavioral problems, confusion, decreased concentration, hallucinations, self-injury, sleep disturbance and suicidal ideas. The patient is not nervous/anxious and is not hyperactive.     Medications: I have reviewed the patient's current medications.  Current Outpatient Medications  Medication Sig Dispense Refill  . amLODipine (NORVASC) 2.5 MG tablet Take 1 tablet (2.5 mg total) by mouth daily. 180 tablet 1  . aspirin 81 MG chewable tablet Chew 1 tablet (81 mg total) by mouth daily.    Marland Kitchen atorvastatin (LIPITOR) 40 MG tablet Take 1 tablet (40 mg total) by mouth daily at 6 PM. 90 tablet 3  . carvedilol (COREG) 25 MG tablet Take 1 tablet (25 mg total) by mouth 2 (two) times daily with a meal. 60 tablet 11  . clonazePAM (KLONOPIN) 0.5 MG tablet TAKE ONE TABLET BY MOUTH TWICE DAILY 180 tablet 0  . clotrimazole (LOTRIMIN) 1 % cream Apply topically 2 (two) times daily. 30 g 0  . famotidine (PEPCID) 40 MG tablet Take 1 tablet (40 mg total) by mouth 2 (two) times daily. 60 tablet 2  . ferrous sulfate 325 (65 FE) MG tablet Take 1 tablet (325 mg total) by mouth daily with breakfast.  3  . folic acid (FOLVITE) 1 MG tablet Take 1 tablet (1 mg total) by mouth daily.    . furosemide (LASIX) 40  MG tablet Take 1 tablet (40 mg total) by mouth daily. 90 tablet 3  . hydrALAZINE (APRESOLINE) 50 MG tablet Take 2 tablets (100 mg total) by mouth 3 (three) times daily. 180 tablet 1  . isosorbide mononitrate (IMDUR) 30 MG 24 hr tablet Take 1 tablet (30 mg total) by mouth daily. 90 tablet 3  . levothyroxine (SYNTHROID) 75 MCG tablet Take 75 mcg by mouth daily.    Marland Kitchen lidocaine-prilocaine (EMLA) cream Apply to affected area once 30 g 3  .  mirabegron ER (MYRBETRIQ) 25 MG TB24 tablet Take 1 tablet (25 mg total) by mouth daily. 30 tablet 3  . nicotine (NICODERM CQ - DOSED IN MG/24 HOURS) 21 mg/24hr patch Place 1 patch (21 mg total) onto the skin daily. 28 patch 0  . ondansetron (ZOFRAN) 8 MG tablet Take 1 tablet (8 mg total) by mouth 2 (two) times daily as needed for refractory nausea / vomiting. Start on day 3 after chemotherapy. 30 tablet 1  . PARoxetine (PAXIL) 40 MG tablet Take 60 mg by mouth every morning.    . potassium chloride (K-DUR) 10 MEQ tablet Take 1 tablet (10 mEq total) by mouth daily. 90 tablet 3  . prochlorperazine (COMPAZINE) 10 MG tablet Take 1 tablet (10 mg total) by mouth every 6 (six) hours as needed (Nausea or vomiting). 40 tablet 1  . traZODone (DESYREL) 50 MG tablet Take 1-2 tablets (50-100 mg total) by mouth at bedtime. 180 tablet 1  . predniSONE (DELTASONE) 10 MG tablet TAKE 1 TABLET (10 MG TOTAL) BY MOUTH DAILY WITH BREAKFAST. 30 tablet 0   No current facility-administered medications for this visit.     Medication Side Effects: None  Allergies:  Allergies  Allergen Reactions  . Codeine Nausea And Vomiting    Past Medical History:  Diagnosis Date  . Acute kidney injury ([place])    a. 09/2018  . Anxiety   . Aortic atherosclerosis ([place])    a. 09/2018 noted on Chest CT.  Marland Kitchen Chronic combined systolic (congestive) and diastolic (congestive) heart failure ([place])    a. 09/2018 Echo: EF 45-50%, diff HK. Triv to small circumfirential pericardial eff.  . Chronic DOE (dyspnea on exertion)   . Claudication [facility])    a. Bilat hip claudication since ~ 2019.  Marland Kitchen Depression   . Dyspnea   . Emphysema lung ([place])   . Exertional angina ([facility])    a. Ex angina since ~ 2019.  Marland Kitchen GERD (gastroesophageal reflux disease)   . Goals of care, counseling/discussion 11/03/2018  . Hiatal hernia   . History of bronchitis   . History of kidney stones   . Hypothyroidism   . Pleural effusion    a. 09/2018 s/p thoracentesis-->9107ml.   . Pleural effusion 09/2018  . Rash 10/2018   Rash began Saturday with unknown reason.  MD seen Monday 10/23/18  . Resting tremor   . Tobacco abuse   . Waldenstrom's macroglobulinemia ([place]) 11/03/2018    Family History  Problem Relation Age of Onset  . Stroke Mother        Mini-strokes. Died @ 16.  . Dementia Mother   . Lung cancer Father        Died in his 57's  . Addison's disease Sister   . Hypertension Brother   . CAD Brother   . Hypertension Brother     Social History   Socioeconomic History  . Marital status: Married    Spouse name: Not on file  . Number of children:  Not on file  . Years of education: Not on file  . Highest education level: Not on file  Occupational History  . Not on file  Social Needs  . Financial resource strain: Not on file  . Food insecurity    Worry: Not on file    Inability: Not on file  . Transportation needs    Medical: Not on file    Non-medical: Not on file  Tobacco Use  . Smoking status: Current Every Day Smoker    Packs/day: 0.25    Years: 40.00    Pack years: 10.00    Types: Cigarettes  . Smokeless tobacco: Never Used  . Tobacco comment: smoking 3-4 cigarettes/day  Substance and Sexual Activity  . Alcohol use: Not Currently    Comment: 1-2 gl wine / night - none since 07/2018  . Drug use: Not Currently    Comment: prev used drugs ~ 35 yrs ago.  Marland Kitchen Sexual activity: Not on file  Lifestyle  . Physical activity    Days per week: Not on file    Minutes per session: Not on file  . Stress: Not on file  Relationships  . Social Herbalist on phone: Not on file    Gets together: Not on file    Attends religious service: Not on file    Active member of club or organization: Not on file    Attends meetings of clubs or organizations: Not on file    Relationship status: Not on file  . Intimate partner violence    Fear of current or ex partner: Not on file    Emotionally abused: Not on file    Physically abused: Not on  file    Forced sexual activity: Not on file  Other Topics Concern  . Not on file  Social History Narrative   Lives in [place] with her husband.  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    Past Medical History, Surgical history, Social history, and Family history were reviewed and updated as appropriate.   Please see review of systems for further details on the patient's review from today.   Objective:   Physical Exam:  There were no vitals taken for this visit.  Physical Exam Constitutional:      General: She is not in acute distress.    Appearance: She is well-developed. She is ill-appearing.  Musculoskeletal:        General: No deformity.  Neurological:     Mental Status: She is alert and oriented to person, place, and time.     Motor: No tremor.     Coordination: Coordination normal.     Gait: Gait abnormal.  Psychiatric:        Attention and Perception: Attention normal. She is attentive. She does not perceive auditory hallucinations.        Mood and Affect: Mood is anxious and depressed. Affect is not labile, blunt, angry or inappropriate.        Speech: Speech normal.        Behavior: Behavior normal.        Thought Content: Thought content normal. Thought content does not include homicidal or suicidal ideation. Thought content does not include homicidal or suicidal plan.        Cognition and Memory: Cognition normal.        Judgment: Judgment normal.     Comments: Insight is good. ? Mild panic at night.     Lab Review:  Component Value Date/Time   NA 145 12/01/2018 1035   K 4.5 12/01/2018 1035   CL 115 (H) 12/01/2018 1035   CO2 23 12/01/2018 1035   GLUCOSE 94 12/01/2018 1035   BUN 27 (H) 12/01/2018 1035   CREATININE 1.10 (H) 12/01/2018 1035   CALCIUM 8.3 (L) 12/01/2018 1035   PROT 4.6 (L) 12/01/2018 1035   ALBUMIN 3.0 (L) 12/01/2018 1035   AST 17 12/01/2018 1035   ALT 22 12/01/2018 1035   ALKPHOS 50 12/01/2018 1035   BILITOT 0.4 12/01/2018 1035    GFRNONAA 51 (L) 12/01/2018 1035   GFRAA 59 (L) 12/01/2018 1035       Component Value Date/Time   WBC 4.2 12/01/2018 1035   WBC 2.9 (L) 11/17/2018 1515   RBC 3.97 12/01/2018 1035   HGB 12.3 12/01/2018 1035   HCT 38.2 12/01/2018 1035   PLT 147 (L) 12/01/2018 1035   MCV 96.2 12/01/2018 1035   MCH 31.0 12/01/2018 1035   MCHC 32.2 12/01/2018 1035   RDW 16.4 (H) 12/01/2018 1035   LYMPHSABS 0.8 12/01/2018 1035   MONOABS 0.6 12/01/2018 1035   EOSABS 0.1 12/01/2018 1035   BASOSABS 0.0 12/01/2018 1035   Normal TSH per PCP.  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  She is having mild symptoms of each.  She did not tolerate the Wellbutrin.  Unfortunately she has a new diagnosis of cancer and it has caused weight loss and fatigue.  She does not want to start new medications because she is having to take a lot of medications at this point.  She is having physical therapy.  She is getting chemotherapy.  She is tolerating paroxetine and clonazepam well and feels like she needs full dose of both.  .   we would consider Abilify.  Discussed that option as well.  No med changes today.    Encouraged social another types of stimulation to the extent that is possible..  Supportive therapy dealing with the cancer.  FU 6 mos Lynder Parents, MD, DFAPA  Please see After Visit Summary for patient specific instructions.  Future Appointments  Date Time Provider [place]  12/22/2018 10:00 AM [facility] LAB [facility] None  12/22/2018 10:15 AM [facility] INJ NURSE [facility] None  12/22/2018 10:45 AM [place], Holli Humbles, NP [facility] None  12/22/2018 11:00 AM [facility] B3 [facility] None  12/28/2018  8:45 AM [place],  Tami Lin, PA [facility]  01/12/2019  9:45 AM [facility] LAB [facility] None  01/12/2019 10:00 AM [facility] INJ NURSE [facility] None  01/12/2019 10:15 AM [place], Rudell Cobb, MD [facility] None  01/12/2019 10:45 AM [facility] A4 [facility] None    No orders of the defined types were placed in this encounter.     -------------------------------

## 2018-12-22 ENCOUNTER — Inpatient Hospital Stay: Payer: Medicare Other

## 2018-12-22 ENCOUNTER — Inpatient Hospital Stay: Payer: Medicare Other | Attending: Hematology & Oncology | Admitting: Family

## 2018-12-22 ENCOUNTER — Other Ambulatory Visit: Payer: Self-pay

## 2018-12-22 VITALS — BP 117/93 | HR 74

## 2018-12-22 DIAGNOSIS — Z79899 Other long term (current) drug therapy: Secondary | ICD-10-CM | POA: Insufficient documentation

## 2018-12-22 DIAGNOSIS — C880 Waldenstrom macroglobulinemia: Secondary | ICD-10-CM | POA: Insufficient documentation

## 2018-12-22 DIAGNOSIS — N189 Chronic kidney disease, unspecified: Secondary | ICD-10-CM | POA: Diagnosis not present

## 2018-12-22 DIAGNOSIS — N183 Chronic kidney disease, stage 3 unspecified: Secondary | ICD-10-CM

## 2018-12-22 DIAGNOSIS — D631 Anemia in chronic kidney disease: Secondary | ICD-10-CM | POA: Diagnosis not present

## 2018-12-22 DIAGNOSIS — M069 Rheumatoid arthritis, unspecified: Secondary | ICD-10-CM | POA: Diagnosis not present

## 2018-12-22 DIAGNOSIS — Z5112 Encounter for antineoplastic immunotherapy: Secondary | ICD-10-CM | POA: Diagnosis not present

## 2018-12-22 DIAGNOSIS — E611 Iron deficiency: Secondary | ICD-10-CM | POA: Diagnosis not present

## 2018-12-22 LAB — CBC WITH DIFFERENTIAL (CANCER CENTER ONLY)
Abs Immature Granulocytes: 0.4 10*3/uL — ABNORMAL HIGH (ref 0.00–0.07)
Basophils Absolute: 0 10*3/uL (ref 0.0–0.1)
Basophils Relative: 0 %
Eosinophils Absolute: 0 10*3/uL (ref 0.0–0.5)
Eosinophils Relative: 0 %
HCT: 31.3 % — ABNORMAL LOW (ref 36.0–46.0)
Hemoglobin: 10.1 g/dL — ABNORMAL LOW (ref 12.0–15.0)
Immature Granulocytes: 5 %
Lymphocytes Relative: 10 %
Lymphs Abs: 0.8 10*3/uL (ref 0.7–4.0)
MCH: 31.2 pg (ref 26.0–34.0)
MCHC: 32.3 g/dL (ref 30.0–36.0)
MCV: 96.6 fL (ref 80.0–100.0)
Monocytes Absolute: 0.2 10*3/uL (ref 0.1–1.0)
Monocytes Relative: 3 %
Neutro Abs: 6.9 10*3/uL (ref 1.7–7.7)
Neutrophils Relative %: 82 %
Platelet Count: 239 10*3/uL (ref 150–400)
RBC: 3.24 MIL/uL — ABNORMAL LOW (ref 3.87–5.11)
RDW: 14.6 % (ref 11.5–15.5)
WBC Count: 8.4 10*3/uL (ref 4.0–10.5)
nRBC: 0 % (ref 0.0–0.2)

## 2018-12-22 LAB — CMP (CANCER CENTER ONLY)
ALT: 22 U/L (ref 0–44)
AST: 17 U/L (ref 15–41)
Albumin: 3.4 g/dL — ABNORMAL LOW (ref 3.5–5.0)
Alkaline Phosphatase: 48 U/L (ref 38–126)
Anion gap: 8 (ref 5–15)
BUN: 24 mg/dL — ABNORMAL HIGH (ref 8–23)
CO2: 23 mmol/L (ref 22–32)
Calcium: 9 mg/dL (ref 8.9–10.3)
Chloride: 110 mmol/L (ref 98–111)
Creatinine: 1.08 mg/dL — ABNORMAL HIGH (ref 0.44–1.00)
GFR, Est AFR Am: >60 mL/min (ref >60)
GFR, Estimated: 52 mL/min — ABNORMAL LOW (ref >60)
Glucose, Bld: 111 mg/dL — ABNORMAL HIGH (ref 70–99)
Potassium: 4.2 mmol/L (ref 3.5–5.1)
Sodium: 141 mmol/L (ref 135–145)
Total Bilirubin: 0.4 mg/dL (ref 0.3–1.2)
Total Protein: 5.1 g/dL — ABNORMAL LOW (ref 6.5–8.1)

## 2018-12-22 LAB — LACTATE DEHYDROGENASE: LDH: 306 U/L — ABNORMAL HIGH (ref 98–192)

## 2018-12-22 MED ORDER — HEPARIN SOD (PORK) LOCK FLUSH 100 UNIT/ML IV SOLN
500.0000 [IU] | Freq: Once | INTRAVENOUS | Status: AC | PRN
  Administered 2018-12-22: 500 [IU]
  Filled 2018-12-22: qty 5

## 2018-12-22 MED ORDER — SODIUM CHLORIDE 0.9 % IV SOLN
Freq: Once | INTRAVENOUS | Status: AC
  Administered 2018-12-22: 12:00:00 via INTRAVENOUS
  Filled 2018-12-22: qty 250

## 2018-12-22 MED ORDER — EPOETIN ALFA-EPBX 40000 UNIT/ML IJ SOLN
40000.0000 [IU] | Freq: Once | INTRAMUSCULAR | Status: AC
  Administered 2018-12-22: 40000 [IU] via SUBCUTANEOUS
  Filled 2018-12-22: qty 1

## 2018-12-22 MED ORDER — ACETAMINOPHEN 325 MG PO TABS
ORAL_TABLET | ORAL | Status: AC
  Filled 2018-12-22: qty 2

## 2018-12-22 MED ORDER — SODIUM CHLORIDE 0.9% FLUSH
10.0000 mL | INTRAVENOUS | Status: DC | PRN
  Administered 2018-12-22: 10 mL
  Filled 2018-12-22: qty 10

## 2018-12-22 MED ORDER — DIPHENHYDRAMINE HCL 25 MG PO CAPS
50.0000 mg | ORAL_CAPSULE | Freq: Once | ORAL | Status: AC
  Administered 2018-12-22: 50 mg via ORAL

## 2018-12-22 MED ORDER — DIPHENHYDRAMINE HCL 25 MG PO CAPS
ORAL_CAPSULE | ORAL | Status: AC
  Filled 2018-12-22: qty 2

## 2018-12-22 MED ORDER — ACETAMINOPHEN 325 MG PO TABS
650.0000 mg | ORAL_TABLET | Freq: Once | ORAL | Status: AC
  Administered 2018-12-22: 650 mg via ORAL

## 2018-12-22 MED ORDER — SODIUM CHLORIDE 0.9 % IV SOLN
375.0000 mg/m2 | Freq: Once | INTRAVENOUS | Status: AC
  Administered 2018-12-22: 600 mg via INTRAVENOUS
  Filled 2018-12-22: qty 50

## 2018-12-22 NOTE — Patient Instructions (Signed)
Rituximab injection What is this medicine? RITUXIMAB (ri TUX i mab) is a monoclonal antibody. It is used to treat certain types of cancer like non-Hodgkin lymphoma and chronic lymphocytic leukemia. It is also used to treat rheumatoid arthritis, granulomatosis with polyangiitis (or Wegener's granulomatosis), microscopic polyangiitis, and pemphigus vulgaris. This medicine may be used for other purposes; ask your health care provider or pharmacist if you have questions. COMMON BRAND NAME(S): Rituxan, RUXIENCE What should I tell my health care provider before I take this medicine? They need to know if you have any of these conditions:  heart disease  infection (especially a virus infection such as hepatitis B, chickenpox, cold sores, or herpes)  immune system problems  irregular heartbeat  kidney disease  low blood counts, like low white cell, platelet, or red cell counts  lung or breathing disease, like asthma  recently received or scheduled to receive a vaccine  an unusual or allergic reaction to rituximab, other medicines, foods, dyes, or preservatives  pregnant or trying to get pregnant  breast-feeding How should I use this medicine? This medicine is for infusion into a vein. It is administered in a hospital or clinic by a specially trained health care professional. A special MedGuide will be given to you by the pharmacist with each prescription and refill. Be sure to read this information carefully each time. Talk to your pediatrician regarding the use of this medicine in children. This medicine is not approved for use in children. Overdosage: If you think you have taken too much of this medicine contact a poison control center or emergency room at once. NOTE: This medicine is only for you. Do not share this medicine with others. What if I miss a dose? It is important not to miss a dose. Call your doctor or health care professional if you are unable to keep an appointment. What  may interact with this medicine?  cisplatin  live virus vaccines This list may not describe all possible interactions. Give your health care provider a list of all the medicines, herbs, non-prescription drugs, or dietary supplements you use. Also tell them if you smoke, drink alcohol, or use illegal drugs. Some items may interact with your medicine. What should I watch for while using this medicine? Your condition will be monitored carefully while you are receiving this medicine. You may need blood work done while you are taking this medicine. This medicine can cause serious allergic reactions. To reduce your risk you may need to take medicine before treatment with this medicine. Take your medicine as directed. In some patients, this medicine may cause a serious brain infection that may cause death. If you have any problems seeing, thinking, speaking, walking, or standing, tell your healthcare professional right away. If you cannot reach your healthcare professional, urgently seek other source of medical care. Call your doctor or health care professional for advice if you get a fever, chills or sore throat, or other symptoms of a cold or flu. Do not treat yourself. This drug decreases your body's ability to fight infections. Try to avoid being around people who are sick. Do not become pregnant while taking this medicine or for at least 12 months after stopping it. Women should inform their doctor if they wish to become pregnant or think they might be pregnant. There is a potential for serious side effects to an unborn child. Talk to your health care professional or pharmacist for more information. Do not breast-feed an infant while taking this medicine or for at   least 6 months after stopping it. What side effects may I notice from receiving this medicine? Side effects that you should report to your doctor or health care professional as soon as possible:  allergic reactions like skin rash, itching or  hives; swelling of the face, lips, or tongue  breathing problems  chest pain  changes in vision  diarrhea  headache with fever, neck stiffness, sensitivity to light, nausea, or confusion  fast, irregular heartbeat  loss of memory  low blood counts - this medicine may decrease the number of white blood cells, red blood cells and platelets. You may be at increased risk for infections and bleeding.  mouth sores  problems with balance, talking, or walking  redness, blistering, peeling or loosening of the skin, including inside the mouth  signs of infection - fever or chills, cough, sore throat, pain or difficulty passing urine  signs and symptoms of kidney injury like trouble passing urine or change in the amount of urine  signs and symptoms of liver injury like dark yellow or brown urine; general ill feeling or flu-like symptoms; light-colored stools; loss of appetite; nausea; right upper belly pain; unusually weak or tired; yellowing of the eyes or skin  signs and symptoms of low blood pressure like dizziness; feeling faint or lightheaded, falls; unusually weak or tired  stomach pain  swelling of the ankles, feet, hands  unusual bleeding or bruising  vomiting Side effects that usually do not require medical attention (report to your doctor or health care professional if they continue or are bothersome):  headache  joint pain  muscle cramps or muscle pain  nausea  tiredness This list may not describe all possible side effects. Call your doctor for medical advice about side effects. You may report side effects to FDA at 1-800-FDA-1088. Where should I keep my medicine? This drug is given in a hospital or clinic and will not be stored at home. NOTE: This sheet is a summary. It may not cover all possible information. If you have questions about this medicine, talk to your doctor, pharmacist, or health care provider.  2020 [employer] Standard (2018-05-17  22:01:36)  

## 2018-12-22 NOTE — Patient Instructions (Signed)

## 2018-12-22 NOTE — Progress Notes (Signed)
Hematology and Oncology Follow Up Visit  Rediet Zittel [id number] 10/30/1950 68 y.o. 12/22/2018   Principle Diagnosis:  Waldenstrom's macroglobulinemia Anemia, chronic renal insufficiency, probable rheumatoid arthritis  Current Therapy:   Rituxan/Cytoxan-- start cycle #1 on 11/10/2018 -- cytoxan d/c'ed on 12/01/2018 Retacrit 40,000 units sq for Hgb < 11   Interim History:  Ms. Cleverly is here today for follow-up and treatment. She states that she feels "not as strong" on her left hip/side and that this has been an issue for years. She recently got an injection which seems to have helped a bit.  No facial deficit. No changes or loss of vision.  She has equal swelling in both lower extremities that she states started 1 weeks ago. No redness. There is 2+ pitting edema. She denies pain.  She started Norvasc around 3 weeks ago so I will also pass my note along to her cardiologist Dr. Debara Pickett so that he can also monitor.  Albumin remains low.  She does sit in a chair for in extended period of time daily and will prop her feet up when she starts to notice swelling.  She states that she will sometimes have a tightness in her chest that she feels may be due to anxiety.  She sometimes has SOB with over exertion and will take a break to rest if needed.  No fever, n/v, cough, rash, dizziness, chest pain, palpitations, abdominal pain or changes in bowel or bladder habits.  No tenderness, numbness or tingling in her extremities.  She state that her chronic lower back and hip pain is unchanged.  She states that her appetite comes and goes. Of note, she has history of anorexia. Her weight is down 3 lbs since last month.   ECOG Performance Status: 2 - Symptomatic, <50% confined to bed  Medications:  Allergies as of 12/22/2018      Reactions   Codeine Nausea And Vomiting      Medication List       Accurate as of December 22, 2018 11:05 AM. If you have any questions, ask your nurse or doctor.         amLODipine 2.5 MG tablet Commonly known as: NORVASC Take 1 tablet (2.5 mg total) by mouth daily.   aspirin 81 MG chewable tablet Chew 1 tablet (81 mg total) by mouth daily.   atorvastatin 40 MG tablet Commonly known as: LIPITOR Take 1 tablet (40 mg total) by mouth daily at 6 PM.   carvedilol 25 MG tablet Commonly known as: COREG Take 1 tablet (25 mg total) by mouth 2 (two) times daily with a meal.   clonazePAM 0.5 MG tablet Commonly known as: KLONOPIN TAKE ONE TABLET BY MOUTH TWICE DAILY   clotrimazole 1 % cream Commonly known as: LOTRIMIN Apply topically 2 (two) times daily.   famotidine 40 MG tablet Commonly known as: PEPCID Take 1 tablet (40 mg total) by mouth 2 (two) times daily.   ferrous sulfate 325 (65 FE) MG tablet Take 1 tablet (325 mg total) by mouth daily with breakfast.   folic acid 1 MG tablet Commonly known as: FOLVITE Take 1 tablet (1 mg total) by mouth daily.   furosemide 40 MG tablet Commonly known as: LASIX Take 1 tablet (40 mg total) by mouth daily.   hydrALAZINE 50 MG tablet Commonly known as: APRESOLINE Take 2 tablets (100 mg total) by mouth 3 (three) times daily.   isosorbide mononitrate 30 MG 24 hr tablet Commonly known as: IMDUR Take 1 tablet (30 mg  total) by mouth daily.   levothyroxine 75 MCG tablet Commonly known as: SYNTHROID Take 75 mcg by mouth daily.   lidocaine-prilocaine cream Commonly known as: EMLA Apply to affected area once   mirabegron ER 25 MG Tb24 tablet Commonly known as: Myrbetriq Take 1 tablet (25 mg total) by mouth daily.   nicotine 21 mg/24hr patch Commonly known as: NICODERM CQ - dosed in mg/24 hours Place 1 patch (21 mg total) onto the skin daily.   ondansetron 8 MG tablet Commonly known as: Zofran Take 1 tablet (8 mg total) by mouth 2 (two) times daily as needed for refractory nausea / vomiting. Start on day 3 after chemotherapy.   PARoxetine 40 MG tablet Commonly known as: PAXIL Take 60 mg by mouth  every morning.   potassium chloride 10 MEQ tablet Commonly known as: K-DUR Take 1 tablet (10 mEq total) by mouth daily.   predniSONE 10 MG tablet Commonly known as: DELTASONE TAKE 1 TABLET (10 MG TOTAL) BY MOUTH DAILY WITH BREAKFAST.   prochlorperazine 10 MG tablet Commonly known as: COMPAZINE Take 1 tablet (10 mg total) by mouth every 6 (six) hours as needed (Nausea or vomiting).   traZODone 50 MG tablet Commonly known as: DESYREL Take 1-2 tablets (50-100 mg total) by mouth at bedtime.       Allergies:  Allergies  Allergen Reactions  . Codeine Nausea And Vomiting    Past Medical History, Surgical history, Social history, and Family History were reviewed and updated.  Review of Systems: All other 10 point review of systems is negative.   Physical Exam:  vitals were not taken for this visit.   Wt Readings from Last 3 Encounters:  12/22/18 121 lb (54.9 kg)  12/01/18 124 lb (56.2 kg)  11/17/18 127 lb 13.9 oz (58 kg)    Ocular: Sclerae unicteric, pupils equal, round and reactive to light Ear-nose-throat: Oropharynx clear, dentition fair Lymphatic: No cervical or supraclavicular adenopathy Lungs no rales or rhonchi, good excursion bilaterally Heart regular rate and rhythm, no murmur appreciated Abd soft, nontender, positive bowel sounds, no liver or spleen tip palpated on exam, no fluid wave  MSK no focal spinal tenderness, no joint edema Neuro: non-focal, well-oriented, appropriate affect Breasts: Deferred   Lab Results  Component Value Date   WBC 8.4 12/22/2018   HGB 10.1 (L) 12/22/2018   HCT 31.3 (L) 12/22/2018   MCV 96.6 12/22/2018   PLT 239 12/22/2018   Lab Results  Component Value Date   FERRITIN 595 (H) 10/11/2018   IRON 81 10/11/2018   TIBC 209 (L) 10/11/2018   UIBC 128 10/11/2018   IRONPCTSAT 39 10/11/2018   Lab Results  Component Value Date   RETICCTPCT 2.5 10/01/2018   RBC 3.24 (L) 12/22/2018   Lab Results  Component Value Date    KPAFRELGTCHN 87.2 (H) 11/21/2018   LAMBDASER 14.6 11/21/2018   KAPLAMBRATIO 5.97 (H) 11/21/2018   Lab Results  Component Value Date   IGGSERUM 52 (L) 11/21/2018   IGGSERUM 52 (L) 11/21/2018   IGA 59 (L) 11/21/2018   IGA 57 (L) 11/21/2018   IGMSERUM 362 (H) 11/21/2018   IGMSERUM 349 (H) 11/21/2018   Lab Results  Component Value Date   TOTALPROTELP 4.9 (L) 11/21/2018   ALBUMINELP 2.9 11/21/2018   A1GS 0.3 11/21/2018   A2GS 1.0 11/21/2018   BETS 0.5 (L) 11/21/2018   GAMS 0.3 (L) 11/21/2018   MSPIKE 0.1 (H) 11/21/2018   SPEI Comment 09/23/2018     Chemistry  Component Value Date/Time   NA 145 12/01/2018 1035   K 4.5 12/01/2018 1035   CL 115 (H) 12/01/2018 1035   CO2 23 12/01/2018 1035   BUN 27 (H) 12/01/2018 1035   CREATININE 1.10 (H) 12/01/2018 1035      Component Value Date/Time   CALCIUM 8.3 (L) 12/01/2018 1035   ALKPHOS 50 12/01/2018 1035   AST 17 12/01/2018 1035   ALT 22 12/01/2018 1035   BILITOT 0.4 12/01/2018 1035       Impression and Plan: Ms. Fallat is a very pleasant 68 yo caucasian female with Waldenstrm's macroglobulinemia.  She is tolerating Rituxan well and will proceed with treatment today as planned.  She also received Retacrit for Hgb 13.4.   I was also able to speak with her brother Eddie Dibbles over the phone and update him on today's visit.  We will plan to see her back in another 3 weeks.  She will contact our office with any questions or concerns. We can certainly see her sooner if needed.   Laverna Peace, NP 9/4/202011:05 AM

## 2018-12-23 LAB — IGG, IGA, IGM
IgA: 54 mg/dL — ABNORMAL LOW (ref 87–352)
IgG (Immunoglobin G), Serum: 32 mg/dL — ABNORMAL LOW (ref 586–1602)
IgM (Immunoglobulin M), Srm: 283 mg/dL — ABNORMAL HIGH (ref 26–217)

## 2018-12-23 LAB — RHEUMATOID FACTOR: Rheumatoid fact SerPl-aCnc: 487.9 IU/mL — ABNORMAL HIGH (ref 0.0–13.9)

## 2018-12-26 ENCOUNTER — Telehealth: Payer: Self-pay | Admitting: *Deleted

## 2018-12-26 LAB — PROTEIN ELECTROPHORESIS, SERUM, WITH REFLEX
A/G Ratio: 1.8 — ABNORMAL HIGH (ref 0.7–1.7)
Albumin ELP: 3.2 g/dL (ref 2.9–4.4)
Alpha-1-Globulin: 0.3 g/dL (ref 0.0–0.4)
Alpha-2-Globulin: 0.8 g/dL (ref 0.4–1.0)
Beta Globulin: 0.6 g/dL — ABNORMAL LOW (ref 0.7–1.3)
Gamma Globulin: 0.2 g/dL — ABNORMAL LOW (ref 0.4–1.8)
Globulin, Total: 1.8 g/dL — ABNORMAL LOW (ref 2.2–3.9)
Total Protein ELP: 5 g/dL — ABNORMAL LOW (ref 6.0–8.5)

## 2018-12-26 LAB — KAPPA/LAMBDA LIGHT CHAINS
Kappa free light chain: 67.3 mg/L — ABNORMAL HIGH (ref 3.3–19.4)
Kappa, lambda light chain ratio: 3.96 — ABNORMAL HIGH (ref 0.26–1.65)
Lambda free light chains: 17 mg/L (ref 5.7–26.3)

## 2018-12-26 NOTE — Telephone Encounter (Signed)
Call received from patient wanting to know if Dr. Marin Olp is planning on sending her to a Rheumatologist d/t her elevated Rheumatoid factor.  Patient notified that Dr. Marin Olp would like her to see Dr. Gavin Pound and that we are awaiting on a phone call back from her now for Dr. Marin Olp to speak with her regarding pt.  Pt appreciative of assistance and has no further questions or concerns at this time.

## 2018-12-28 ENCOUNTER — Encounter: Payer: Self-pay | Admitting: Physician Assistant

## 2018-12-28 ENCOUNTER — Other Ambulatory Visit: Payer: Self-pay

## 2018-12-28 ENCOUNTER — Ambulatory Visit (INDEPENDENT_AMBULATORY_CARE_PROVIDER_SITE_OTHER): Payer: Medicare Other | Admitting: Physician Assistant

## 2018-12-28 ENCOUNTER — Telehealth: Payer: Self-pay | Admitting: Physician Assistant

## 2018-12-28 VITALS — BP 130/71 | HR 74 | Ht 66.0 in | Wt 123.0 lb

## 2018-12-28 DIAGNOSIS — N183 Chronic kidney disease, stage 3 unspecified: Secondary | ICD-10-CM

## 2018-12-28 DIAGNOSIS — I5022 Chronic systolic (congestive) heart failure: Secondary | ICD-10-CM | POA: Diagnosis not present

## 2018-12-28 DIAGNOSIS — R0600 Dyspnea, unspecified: Secondary | ICD-10-CM

## 2018-12-28 DIAGNOSIS — E785 Hyperlipidemia, unspecified: Secondary | ICD-10-CM

## 2018-12-28 DIAGNOSIS — R0609 Other forms of dyspnea: Secondary | ICD-10-CM | POA: Diagnosis not present

## 2018-12-28 DIAGNOSIS — R600 Localized edema: Secondary | ICD-10-CM

## 2018-12-28 MED ORDER — FUROSEMIDE 40 MG PO TABS: 20.0000 mg | ORAL_TABLET | Freq: Every day | ORAL | 1 refills | Status: DC

## 2018-12-28 MED ORDER — FUROSEMIDE 40 MG PO TABS: ORAL_TABLET | ORAL | 1 refills | Status: DC

## 2018-12-28 NOTE — Telephone Encounter (Signed)
FYI from visit today. Thanks!

## 2018-12-28 NOTE — Telephone Encounter (Signed)
Follow Up   Anderson Malta from [facility] Rhe[facility] returning call about a sooner appointment. States that medical records explaining why patient needs to be seen sooner needs to be faxed over in order to have appointment moved to sooner.   FAX # [phone]

## 2018-12-28 NOTE — Telephone Encounter (Signed)
New message  Per patient's brother the patient is taking lasix 20 mg 1 time daily.The   hydrALAZINE (APRESOLINE) 50 MG tablet the patient is taking as directed on her records. Please call if need to discuss.

## 2018-12-28 NOTE — Patient Instructions (Signed)
Medication Instructions:  Increase Furosemide to 40 mg---take 1/2 tablet (20 mg) in the morning and 1/2 tablet at lunch time.  If you need a refill on your cardiac medications before your next appointment, please call your pharmacy.    Follow-Up: At [facility], you and your health needs are our priority.  As part of our continuing mission to provide you with exceptional heart care, we have created designated Provider Care Teams.  These Care Teams include your primary Cardiologist (physician) and Advanced Practice Providers (APPs -  Physician Assistants and Nurse Practitioners) who all work together to provide you with the care you need, when you need it. . You have been scheduled for a follow-up visit with Dr. Debara Pickett on Monday, October 12 at 2:30 PM.  Any Other Special Instructions Will Be Listed Below (If Applicable). Your physician has requested that you regularly monitor and record your blood pressure readings at home. Please use the same machine at the same time of day to check your readings and record them to bring to your follow-up visit. Take BP when your husband comes home from work.

## 2018-12-28 NOTE — Telephone Encounter (Signed)
[facility] Rhe[facility] per Fabian Sharp, [place]. Pt was referred to [facility] and not sure if she has an appointment. Pt stated she has been waiting for months. Per Fabian Sharp, PA, pt needs to be seen as soon as possible. I LVM on New pt coordinator, Anderson Malta, to call back.

## 2018-12-29 NOTE — Telephone Encounter (Signed)
Angie, is reason for sooner appt in your note from yesterday?

## 2019-01-01 ENCOUNTER — Other Ambulatory Visit: Payer: Self-pay

## 2019-01-01 MED ORDER — HYDRALAZINE HCL 50 MG PO TABS: 100.0000 mg | ORAL_TABLET | Freq: Three times a day (TID) | ORAL | 1 refills | Status: DC

## 2019-01-09 ENCOUNTER — Telehealth: Payer: Self-pay | Admitting: *Deleted

## 2019-01-09 NOTE — Telephone Encounter (Signed)
   [facility] Pre-operative Risk Assessment    Request for surgical clearance:  1. What type of surgery is being performed? CATARACT EXTRACTION  2. When is this surgery scheduled? TBD  3. What type of clearance is required (medical clearance vs. Pharmacy clearance to hold med vs. Both)? MEDICAL  4. Are there any medications that need to be held prior to surgery and how long? NONE  5. Practice name and name of physician performing surgery? [facility]  6. What is your office phone number [phone]    7.   What is your office fax number [phone]  8.   Anesthesia type (None, local, MAC, general) ? IV   Fredia Beets 01/09/2019, 2:27 PM  _________________________________________________________________   (provider comments below)

## 2019-01-10 NOTE — Telephone Encounter (Signed)
   Primary Cardiologist: Pixie Casino, MD  Chart reviewed as part of pre-operative protocol coverage. Cataract extractions are recognized in guidelines as low risk surgeries that do not typically require specific preoperative testing or holding of blood thinner therapy. Therefore, given past medical history and time since last visit, based on ACC/AHA guidelines, Tiffany Leon would be at acceptable risk for the planned procedure without further cardiovascular testing.   I will route this recommendation to the requesting party via Epic fax function and remove from pre-op pool.  Please call with questions.  [place], [place] 01/10/2019, 2:10 PM

## 2019-01-12 ENCOUNTER — Inpatient Hospital Stay: Payer: Medicare Other

## 2019-01-12 ENCOUNTER — Other Ambulatory Visit: Payer: Self-pay

## 2019-01-12 ENCOUNTER — Encounter: Payer: Self-pay | Admitting: Hematology & Oncology

## 2019-01-12 ENCOUNTER — Inpatient Hospital Stay (HOSPITAL_BASED_OUTPATIENT_CLINIC_OR_DEPARTMENT_OTHER): Payer: Medicare Other | Admitting: Hematology & Oncology

## 2019-01-12 VITALS — BP 116/61 | HR 72 | Temp 97.0°F

## 2019-01-12 VITALS — BP 141/65 | HR 72 | Temp 97.1°F | Resp 20 | Wt 116.2 lb

## 2019-01-12 DIAGNOSIS — C880 Waldenstrom macroglobulinemia: Secondary | ICD-10-CM

## 2019-01-12 DIAGNOSIS — Z5112 Encounter for antineoplastic immunotherapy: Secondary | ICD-10-CM | POA: Diagnosis not present

## 2019-01-12 DIAGNOSIS — N183 Chronic kidney disease, stage 3 unspecified: Secondary | ICD-10-CM

## 2019-01-12 DIAGNOSIS — D631 Anemia in chronic kidney disease: Secondary | ICD-10-CM

## 2019-01-12 DIAGNOSIS — E611 Iron deficiency: Secondary | ICD-10-CM

## 2019-01-12 LAB — CMP (CANCER CENTER ONLY)
ALT: 20 U/L (ref 0–44)
AST: 21 U/L (ref 15–41)
Albumin: 3.9 g/dL (ref 3.5–5.0)
Alkaline Phosphatase: 55 U/L (ref 38–126)
Anion gap: 10 (ref 5–15)
BUN: 36 mg/dL — ABNORMAL HIGH (ref 8–23)
CO2: 26 mmol/L (ref 22–32)
Calcium: 9.4 mg/dL (ref 8.9–10.3)
Chloride: 102 mmol/L (ref 98–111)
Creatinine: 1.54 mg/dL — ABNORMAL HIGH (ref 0.44–1.00)
GFR, Est AFR Am: 39 mL/min — ABNORMAL LOW (ref >60)
GFR, Estimated: 34 mL/min — ABNORMAL LOW (ref >60)
Glucose, Bld: 134 mg/dL — ABNORMAL HIGH (ref 70–99)
Potassium: 4.2 mmol/L (ref 3.5–5.1)
Sodium: 138 mmol/L (ref 135–145)
Total Bilirubin: 0.5 mg/dL (ref 0.3–1.2)
Total Protein: 5.8 g/dL — ABNORMAL LOW (ref 6.5–8.1)

## 2019-01-12 LAB — CBC WITH DIFFERENTIAL (CANCER CENTER ONLY)
Abs Immature Granulocytes: 0.48 10*3/uL — ABNORMAL HIGH (ref 0.00–0.07)
Basophils Absolute: 0 10*3/uL (ref 0.0–0.1)
Basophils Relative: 0 %
Eosinophils Absolute: 0 10*3/uL (ref 0.0–0.5)
Eosinophils Relative: 0 %
HCT: 32.5 % — ABNORMAL LOW (ref 36.0–46.0)
Hemoglobin: 10.5 g/dL — ABNORMAL LOW (ref 12.0–15.0)
Immature Granulocytes: 7 %
Lymphocytes Relative: 9 %
Lymphs Abs: 0.6 10*3/uL — ABNORMAL LOW (ref 0.7–4.0)
MCH: 32.3 pg (ref 26.0–34.0)
MCHC: 32.3 g/dL (ref 30.0–36.0)
MCV: 100 fL (ref 80.0–100.0)
Monocytes Absolute: 0.2 10*3/uL (ref 0.1–1.0)
Monocytes Relative: 3 %
Neutro Abs: 5.8 10*3/uL (ref 1.7–7.7)
Neutrophils Relative %: 81 %
Platelet Count: 260 10*3/uL (ref 150–400)
RBC: 3.25 MIL/uL — ABNORMAL LOW (ref 3.87–5.11)
RDW: 16.6 % — ABNORMAL HIGH (ref 11.5–15.5)
WBC Count: 7.3 10*3/uL (ref 4.0–10.5)
nRBC: 0 % (ref 0.0–0.2)

## 2019-01-12 LAB — SAMPLE TO BLOOD BANK

## 2019-01-12 LAB — LACTATE DEHYDROGENASE: LDH: 343 U/L — ABNORMAL HIGH (ref 98–192)

## 2019-01-12 LAB — FERRITIN: Ferritin: 743 ng/mL — ABNORMAL HIGH (ref 11–307)

## 2019-01-12 LAB — RETICULOCYTES
Immature Retic Fract: 4.9 % (ref 2.3–15.9)
RBC.: 3.29 MIL/uL — ABNORMAL LOW (ref 3.87–5.11)
Retic Count, Absolute: 72.4 10*3/uL (ref 19.0–186.0)
Retic Ct Pct: 2.2 % (ref 0.4–3.1)

## 2019-01-12 LAB — IRON AND TIBC
Iron: 149 ug/dL — ABNORMAL HIGH (ref 41–142)
Saturation Ratios: 61 % — ABNORMAL HIGH (ref 21–57)
TIBC: 244 ug/dL (ref 236–444)
UIBC: 95 ug/dL — ABNORMAL LOW (ref 120–384)

## 2019-01-12 MED ORDER — DIPHENHYDRAMINE HCL 25 MG PO CAPS
ORAL_CAPSULE | ORAL | Status: AC
  Filled 2019-01-12: qty 2

## 2019-01-12 MED ORDER — HEPARIN SOD (PORK) LOCK FLUSH 100 UNIT/ML IV SOLN
500.0000 [IU] | Freq: Once | INTRAVENOUS | Status: AC | PRN
  Administered 2019-01-12: 500 [IU]
  Filled 2019-01-12: qty 5

## 2019-01-12 MED ORDER — SODIUM CHLORIDE 0.9 % IV SOLN
Freq: Once | INTRAVENOUS | Status: AC
  Administered 2019-01-12: 12:00:00 via INTRAVENOUS
  Filled 2019-01-12: qty 250

## 2019-01-12 MED ORDER — ACETAMINOPHEN 325 MG PO TABS
ORAL_TABLET | ORAL | Status: AC
  Filled 2019-01-12: qty 2

## 2019-01-12 MED ORDER — SODIUM CHLORIDE 0.9% FLUSH
10.0000 mL | INTRAVENOUS | Status: DC | PRN
  Administered 2019-01-12: 10 mL
  Filled 2019-01-12: qty 10

## 2019-01-12 MED ORDER — SODIUM CHLORIDE 0.9 % IV SOLN
375.0000 mg/m2 | Freq: Once | INTRAVENOUS | Status: AC
  Administered 2019-01-12: 600 mg via INTRAVENOUS
  Filled 2019-01-12: qty 10

## 2019-01-12 MED ORDER — ACETAMINOPHEN 325 MG PO TABS
650.0000 mg | ORAL_TABLET | Freq: Once | ORAL | Status: AC
  Administered 2019-01-12: 650 mg via ORAL

## 2019-01-12 MED ORDER — DIPHENHYDRAMINE HCL 25 MG PO CAPS
50.0000 mg | ORAL_CAPSULE | Freq: Once | ORAL | Status: AC
  Administered 2019-01-12: 50 mg via ORAL

## 2019-01-12 NOTE — Progress Notes (Signed)
[place] per Dr. Marin Olp to treat with creatinine, 1.54.

## 2019-01-12 NOTE — Progress Notes (Signed)
SCr = 1.54. Okay to treat today per Dr. Marin Olp.

## 2019-01-12 NOTE — Progress Notes (Signed)
Hematology and Oncology Follow Up Visit  Tiffany Leon [id number] December 06, 1950 68 y.o. 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  She actually is looking a little bit better.  She does not need a wheelchair when she comes in.  She trimmed her hair.  She does look quite a bit better which is nice to see.  Clearly, the Waldenstrom's that she had has responded quite nicely to the Rituxan.  She did not have a monoclonal spike when we saw her back in early September.  Her IgM level was down to 283 mg/dL.  She has not yet seen the rheumatologist.  I suspect that she will have rheumatoid arthritis.  Her last rheumatoid factor was still quite high.  I really think that this will be her last cycle of Rituxan.  After this, I will then set her up with a bone marrow biopsy so we can see how everything looks in the bone marrow.  She does feel little bit better she says..  She does not need to have any scans done.  I will think the scans were really would help [place] out.  Her iron studies are doing well.  Today, her ferritin was 743 with an iron saturation of 61%.  She seems to be eating a little bit better.  She says she is trying to cook a little bit more.  There is been no issues with nausea or vomiting.  She has had no diarrhea.  There might be a little bit of constipation.   Currently, I would have to say that her performance status is probably ECOG 1.  Medications:  Allergies as of 01/12/2019      Reactions   Codeine Nausea And Vomiting      Medication List       Accurate as of January 12, 2019 11:17 AM. If you have any questions, ask your nurse or doctor.        amLODipine 2.5 MG  tablet Commonly known as: NORVASC Take 1 tablet (2.5 mg total) by mouth daily.   aspirin 81 MG chewable tablet Chew 1 tablet (81 mg total) by mouth daily.   atorvastatin 40 MG tablet Commonly known as: LIPITOR Take 1 tablet (40 mg total) by mouth daily at 6 PM.   carvedilol 25 MG tablet Commonly known as: COREG Take 1 tablet (25 mg total) by mouth 2 (two) times daily with a meal.   clonazePAM 0.5 MG tablet Commonly known as: KLONOPIN TAKE ONE TABLET BY MOUTH TWICE DAILY   clotrimazole 1 % cream Commonly known as: LOTRIMIN Apply topically 2 (two) times daily.   famotidine 40 MG tablet Commonly known as: PEPCID Take 1 tablet (40 mg total) by mouth 2 (two) times daily.   ferrous sulfate 325 (65 FE) MG tablet Take 1 tablet (325 mg total) by mouth daily with breakfast.   folic acid 1 MG tablet Commonly known as: FOLVITE Take 1 tablet (1 mg total) by mouth daily.   furosemide 40 MG tablet Commonly known as: LASIX Take 0.5 tablets (20 mg total) by mouth daily. Take 1/2 tablet (20 mg) by mouth in the morning and 1/2 tablet at lunch time.   hydrALAZINE 50 MG tablet Commonly known as: APRESOLINE  Take 2 tablets (100 mg total) by mouth 3 (three) times daily.   isosorbide mononitrate 30 MG 24 hr tablet Commonly known as: IMDUR Take 1 tablet (30 mg total) by mouth daily.   levothyroxine 75 MCG tablet Commonly known as: SYNTHROID Take 75 mcg by mouth daily.   lidocaine-prilocaine cream Commonly known as: EMLA Apply to affected area once   mirabegron ER 25 MG Tb24 tablet Commonly known as: Myrbetriq Take 1 tablet (25 mg total) by mouth daily.   nicotine 21 mg/24hr patch Commonly known as: NICODERM CQ - dosed in mg/24 hours Place 1 patch (21 mg total) onto the skin daily.   ondansetron 8 MG tablet Commonly known as: Zofran Take 1 tablet (8 mg total) by mouth 2 (two) times daily as needed for refractory nausea / vomiting. Start on day 3 after chemotherapy.   PARoxetine  40 MG tablet Commonly known as: PAXIL Take 60 mg by mouth every morning.   potassium chloride 10 MEQ tablet Commonly known as: K-DUR Take 1 tablet (10 mEq total) by mouth daily.   predniSONE 10 MG tablet Commonly known as: DELTASONE TAKE 1 TABLET (10 MG TOTAL) BY MOUTH DAILY WITH BREAKFAST.   prochlorperazine 10 MG tablet Commonly known as: COMPAZINE Take 1 tablet (10 mg total) by mouth every 6 (six) hours as needed (Nausea or vomiting).   traZODone 50 MG tablet Commonly known as: DESYREL Take 1-2 tablets (50-100 mg total) by mouth at bedtime.       Allergies:  Allergies  Allergen Reactions  . Codeine Nausea And Vomiting    Past Medical History, Surgical history, Social history, and Family History were reviewed and updated.  Review of Systems: Review of Systems  Constitutional: Negative.   HENT: Negative.  Negative for hearing loss.   Eyes: Negative.   Respiratory: Negative.   Cardiovascular: Negative.   Gastrointestinal: Negative.   Genitourinary: Negative.   Musculoskeletal: Positive for joint pain and myalgias.  Skin: Negative.   Neurological: Negative.   Endo/Heme/Allergies: Negative.   Psychiatric/Behavioral: Negative.      Physical Exam:  weight is 116 lb 4 oz (52.7 kg). Her temporal temperature is 97.1 F (36.2 C) (abnormal). Her blood pressure is 141/65 (abnormal) and her pulse is 72. Her respiration is 20 and oxygen saturation is 100%.   Wt Readings from Last 3 Encounters:  01/12/19 116 lb 4 oz (52.7 kg)  12/28/18 123 lb (55.8 kg)  12/22/18 121 lb (54.9 kg)    Physical Exam Vitals signs reviewed.  HENT:     Head: Normocephalic and atraumatic.  Eyes:     Pupils: Pupils are equal, round, and reactive to light.  Neck:     Musculoskeletal: Normal range of motion.  Cardiovascular:     Rate and Rhythm: Normal rate and regular rhythm.     Heart sounds: Normal heart sounds.  Pulmonary:     Effort: Pulmonary effort is normal.     Breath sounds:  Normal breath sounds.  Abdominal:     General: Bowel sounds are normal.     Palpations: Abdomen is soft.  Musculoskeletal: Normal range of motion.        General: No tenderness or deformity.  Lymphadenopathy:     Cervical: No cervical adenopathy.  Skin:    General: Skin is warm and dry.     Findings: No erythema or rash.  Neurological:     Mental Status: She is alert and oriented to person, place, and time.  Psychiatric:  Behavior: Behavior normal.        Thought Content: Thought content normal.        Judgment: Judgment normal.      Lab Results  Component Value Date   WBC 7.3 01/12/2019   HGB 10.5 (L) 01/12/2019   HCT 32.5 (L) 01/12/2019   MCV 100.0 01/12/2019   PLT 260 01/12/2019   Lab Results  Component Value Date   FERRITIN 595 (H) 10/11/2018   IRON 81 10/11/2018   TIBC 209 (L) 10/11/2018   UIBC 128 10/11/2018   IRONPCTSAT 39 10/11/2018   Lab Results  Component Value Date   RETICCTPCT 2.2 01/12/2019   RBC 3.29 (L) 01/12/2019   RBC 3.25 (L) 01/12/2019   Lab Results  Component Value Date   KPAFRELGTCHN 67.3 (H) 12/22/2018   LAMBDASER 17.0 12/22/2018   KAPLAMBRATIO 3.96 (H) 12/22/2018   Lab Results  Component Value Date   IGGSERUM 32 (L) 12/22/2018   IGA 54 (L) 12/22/2018   IGMSERUM 283 (H) 12/22/2018   Lab Results  Component Value Date   TOTALPROTELP 5.0 (L) 12/22/2018   ALBUMINELP 3.2 12/22/2018   A1GS 0.3 12/22/2018   A2GS 0.8 12/22/2018   BETS 0.6 (L) 12/22/2018   GAMS 0.2 (L) 12/22/2018   MSPIKE Not Observed 12/22/2018   SPEI Comment 09/23/2018     Chemistry      Component Value Date/Time   NA 138 01/12/2019 0950   K 4.2 01/12/2019 0950   CL 102 01/12/2019 0950   CO2 26 01/12/2019 0950   BUN 36 (H) 01/12/2019 0950   CREATININE 1.54 (H) 01/12/2019 0950      Component Value Date/Time   CALCIUM 9.4 01/12/2019 0950   ALKPHOS 55 01/12/2019 0950   AST 21 01/12/2019 0950   ALT 20 01/12/2019 0950   BILITOT 0.5 01/12/2019 0950        Impression and Plan: Ms. Newsham is a very pleasant 68 yo caucasian female with Waldenstrm's macroglobulinemia.  She has been on Rituxan for this.  She has responded nicely by her labs.  Again we will have to set up a bone marrow biopsy for her.  I will get that set up for her in about a month.  I will be interesting to see what the rheumatologist has to say regarding her elevated rheumatoid factor level.  The bone marrow biopsy will be very interesting to see.  I am just happy that her quality of life is doing better right now.  I think this is really nice to see for her.  I will plan to get her back probably in early November after she has her bone marrow test done.Marland Kitchen   [name], MD 9/25/202011:17 AM

## 2019-01-12 NOTE — Patient Instructions (Signed)
Rituximab injection What is this medicine? RITUXIMAB (ri TUX i mab) is a monoclonal antibody. It is used to treat certain types of cancer like non-Hodgkin lymphoma and chronic lymphocytic leukemia. It is also used to treat rheumatoid arthritis, granulomatosis with polyangiitis (or Wegener's granulomatosis), microscopic polyangiitis, and pemphigus vulgaris. This medicine may be used for other purposes; ask your health care provider or pharmacist if you have questions. COMMON BRAND NAME(S): Rituxan, RUXIENCE What should I tell my health care provider before I take this medicine? They need to know if you have any of these conditions:  heart disease  infection (especially a virus infection such as hepatitis B, chickenpox, cold sores, or herpes)  immune system problems  irregular heartbeat  kidney disease  low blood counts, like low white cell, platelet, or red cell counts  lung or breathing disease, like asthma  recently received or scheduled to receive a vaccine  an unusual or allergic reaction to rituximab, other medicines, foods, dyes, or preservatives  pregnant or trying to get pregnant  breast-feeding How should I use this medicine? This medicine is for infusion into a vein. It is administered in a hospital or clinic by a specially trained health care professional. A special MedGuide will be given to you by the pharmacist with each prescription and refill. Be sure to read this information carefully each time. Talk to your pediatrician regarding the use of this medicine in children. This medicine is not approved for use in children. Overdosage: If you think you have taken too much of this medicine contact a poison control center or emergency room at once. NOTE: This medicine is only for you. Do not share this medicine with others. What if I miss a dose? It is important not to miss a dose. Call your doctor or health care professional if you are unable to keep an appointment. What  may interact with this medicine?  cisplatin  live virus vaccines This list may not describe all possible interactions. Give your health care provider a list of all the medicines, herbs, non-prescription drugs, or dietary supplements you use. Also tell them if you smoke, drink alcohol, or use illegal drugs. Some items may interact with your medicine. What should I watch for while using this medicine? Your condition will be monitored carefully while you are receiving this medicine. You may need blood work done while you are taking this medicine. This medicine can cause serious allergic reactions. To reduce your risk you may need to take medicine before treatment with this medicine. Take your medicine as directed. In some patients, this medicine may cause a serious brain infection that may cause death. If you have any problems seeing, thinking, speaking, walking, or standing, tell your healthcare professional right away. If you cannot reach your healthcare professional, urgently seek other source of medical care. Call your doctor or health care professional for advice if you get a fever, chills or sore throat, or other symptoms of a cold or flu. Do not treat yourself. This drug decreases your body's ability to fight infections. Try to avoid being around people who are sick. Do not become pregnant while taking this medicine or for at least 12 months after stopping it. Women should inform their doctor if they wish to become pregnant or think they might be pregnant. There is a potential for serious side effects to an unborn child. Talk to your health care professional or pharmacist for more information. Do not breast-feed an infant while taking this medicine or for at   least 6 months after stopping it. What side effects may I notice from receiving this medicine? Side effects that you should report to your doctor or health care professional as soon as possible:  allergic reactions like skin rash, itching or  hives; swelling of the face, lips, or tongue  breathing problems  chest pain  changes in vision  diarrhea  headache with fever, neck stiffness, sensitivity to light, nausea, or confusion  fast, irregular heartbeat  loss of memory  low blood counts - this medicine may decrease the number of white blood cells, red blood cells and platelets. You may be at increased risk for infections and bleeding.  mouth sores  problems with balance, talking, or walking  redness, blistering, peeling or loosening of the skin, including inside the mouth  signs of infection - fever or chills, cough, sore throat, pain or difficulty passing urine  signs and symptoms of kidney injury like trouble passing urine or change in the amount of urine  signs and symptoms of liver injury like dark yellow or brown urine; general ill feeling or flu-like symptoms; light-colored stools; loss of appetite; nausea; right upper belly pain; unusually weak or tired; yellowing of the eyes or skin  signs and symptoms of low blood pressure like dizziness; feeling faint or lightheaded, falls; unusually weak or tired  stomach pain  swelling of the ankles, feet, hands  unusual bleeding or bruising  vomiting Side effects that usually do not require medical attention (report to your doctor or health care professional if they continue or are bothersome):  headache  joint pain  muscle cramps or muscle pain  nausea  tiredness This list may not describe all possible side effects. Call your doctor for medical advice about side effects. You may report side effects to FDA at 1-800-FDA-1088. Where should I keep my medicine? This drug is given in a hospital or clinic and will not be stored at home. NOTE: This sheet is a summary. It may not cover all possible information. If you have questions about this medicine, talk to your doctor, pharmacist, or health care provider.  2020 [employer] Standard (2018-05-17  22:01:36)  

## 2019-01-13 ENCOUNTER — Other Ambulatory Visit: Payer: Self-pay | Admitting: Hematology & Oncology

## 2019-01-15 ENCOUNTER — Telehealth: Payer: Self-pay | Admitting: Hematology & Oncology

## 2019-01-15 ENCOUNTER — Telehealth: Payer: Self-pay | Admitting: *Deleted

## 2019-01-15 ENCOUNTER — Other Ambulatory Visit: Payer: Self-pay | Admitting: Hematology & Oncology

## 2019-01-15 NOTE — Telephone Encounter (Signed)
Message received from patient wanting to know when port will come out and what her injection is for on 02/23/19.  Call placed back to patient and patient notified that port will stay in for now and that injection scheduled on 02/23/19 is for Retacrit.  Pt appreciative of call back and has no questions or concerns at this time.

## 2019-01-15 NOTE — Telephone Encounter (Signed)
lmom to inform patient of 6 wk follow up per 9/25 LOSi

## 2019-01-22 ENCOUNTER — Telehealth: Payer: Self-pay | Admitting: *Deleted

## 2019-01-22 ENCOUNTER — Other Ambulatory Visit: Payer: Self-pay | Admitting: Radiology

## 2019-01-22 NOTE — Telephone Encounter (Signed)
Call received from patient to review her appt times with her.  Appt times reviewed with pt.  Pt appreciative of assistance and has no further questions or concerns at this time.

## 2019-01-23 ENCOUNTER — Other Ambulatory Visit: Payer: Self-pay

## 2019-01-23 ENCOUNTER — Ambulatory Visit (HOSPITAL_COMMUNITY)
Admission: RE | Admit: 2019-01-23 | Discharge: 2019-01-23 | Disposition: A | Discharge status: Home / Self Care | Payer: Medicare Other | Source: Ambulatory Visit | Attending: Hematology & Oncology | Admitting: Hematology & Oncology

## 2019-01-23 ENCOUNTER — Encounter (HOSPITAL_COMMUNITY): Payer: Self-pay

## 2019-01-23 DIAGNOSIS — F17210 Nicotine dependence, cigarettes, uncomplicated: Secondary | ICD-10-CM | POA: Insufficient documentation

## 2019-01-23 DIAGNOSIS — K219 Gastro-esophageal reflux disease without esophagitis: Secondary | ICD-10-CM | POA: Diagnosis not present

## 2019-01-23 DIAGNOSIS — Z8249 Family history of ischemic heart disease and other diseases of the circulatory system: Secondary | ICD-10-CM | POA: Insufficient documentation

## 2019-01-23 DIAGNOSIS — Z79899 Other long term (current) drug therapy: Secondary | ICD-10-CM | POA: Diagnosis not present

## 2019-01-23 DIAGNOSIS — Z7982 Long term (current) use of aspirin: Secondary | ICD-10-CM | POA: Insufficient documentation

## 2019-01-23 DIAGNOSIS — Z801 Family history of malignant neoplasm of trachea, bronchus and lung: Secondary | ICD-10-CM | POA: Insufficient documentation

## 2019-01-23 DIAGNOSIS — I739 Peripheral vascular disease, unspecified: Secondary | ICD-10-CM | POA: Insufficient documentation

## 2019-01-23 DIAGNOSIS — Z79890 Hormone replacement therapy (postmenopausal): Secondary | ICD-10-CM | POA: Diagnosis not present

## 2019-01-23 DIAGNOSIS — Z818 Family history of other mental and behavioral disorders: Secondary | ICD-10-CM | POA: Insufficient documentation

## 2019-01-23 DIAGNOSIS — J439 Emphysema, unspecified: Secondary | ICD-10-CM | POA: Insufficient documentation

## 2019-01-23 DIAGNOSIS — I700 Atherosclerosis of aorta: Secondary | ICD-10-CM | POA: Insufficient documentation

## 2019-01-23 DIAGNOSIS — F329 Major depressive disorder, single episode, unspecified: Secondary | ICD-10-CM | POA: Insufficient documentation

## 2019-01-23 DIAGNOSIS — F419 Anxiety disorder, unspecified: Secondary | ICD-10-CM | POA: Diagnosis not present

## 2019-01-23 DIAGNOSIS — Z7952 Long term (current) use of systemic steroids: Secondary | ICD-10-CM | POA: Insufficient documentation

## 2019-01-23 DIAGNOSIS — Z823 Family history of stroke: Secondary | ICD-10-CM | POA: Diagnosis not present

## 2019-01-23 DIAGNOSIS — Z885 Allergy status to narcotic agent status: Secondary | ICD-10-CM | POA: Insufficient documentation

## 2019-01-23 DIAGNOSIS — C880 Waldenstrom macroglobulinemia: Secondary | ICD-10-CM | POA: Diagnosis not present

## 2019-01-23 DIAGNOSIS — E039 Hypothyroidism, unspecified: Secondary | ICD-10-CM | POA: Insufficient documentation

## 2019-01-23 LAB — CBC WITH DIFFERENTIAL/PLATELET
Abs Immature Granulocytes: 0.22 10*3/uL — ABNORMAL HIGH (ref 0.00–0.07)
Basophils Absolute: 0 10*3/uL (ref 0.0–0.1)
Basophils Relative: 1 %
Eosinophils Absolute: 0 10*3/uL (ref 0.0–0.5)
Eosinophils Relative: 1 %
HCT: 29.7 % — ABNORMAL LOW (ref 36.0–46.0)
Hemoglobin: 9.6 g/dL — ABNORMAL LOW (ref 12.0–15.0)
Immature Granulocytes: 7 %
Lymphocytes Relative: 20 %
Lymphs Abs: 0.6 10*3/uL — ABNORMAL LOW (ref 0.7–4.0)
MCH: 32.1 pg (ref 26.0–34.0)
MCHC: 32.3 g/dL (ref 30.0–36.0)
MCV: 99.3 fL (ref 80.0–100.0)
Monocytes Absolute: 0.3 10*3/uL (ref 0.1–1.0)
Monocytes Relative: 8 %
Neutro Abs: 2 10*3/uL (ref 1.7–7.7)
Neutrophils Relative %: 63 %
Platelets: 149 10*3/uL — ABNORMAL LOW (ref 150–400)
RBC: 2.99 MIL/uL — ABNORMAL LOW (ref 3.87–5.11)
RDW: 15.6 % — ABNORMAL HIGH (ref 11.5–15.5)
WBC: 3.1 10*3/uL — ABNORMAL LOW (ref 4.0–10.5)
nRBC: 0 % (ref 0.0–0.2)

## 2019-01-23 LAB — PROTIME-INR
INR: 0.9 (ref 0.8–1.2)
Prothrombin Time: 11.7 seconds (ref 11.4–15.2)

## 2019-01-23 MED ORDER — MIDAZOLAM HCL 2 MG/2ML IJ SOLN
INTRAMUSCULAR | Status: AC | PRN
  Administered 2019-01-23 (×2): 1 mg via INTRAVENOUS

## 2019-01-23 MED ORDER — FENTANYL CITRATE (PF) 100 MCG/2ML IJ SOLN
INTRAMUSCULAR | Status: AC | PRN
  Administered 2019-01-23 (×2): 50 ug via INTRAVENOUS

## 2019-01-23 MED ORDER — FENTANYL CITRATE (PF) 100 MCG/2ML IJ SOLN
INTRAMUSCULAR | Status: AC
  Filled 2019-01-23: qty 2

## 2019-01-23 MED ORDER — MIDAZOLAM HCL 2 MG/2ML IJ SOLN
INTRAMUSCULAR | Status: AC
  Filled 2019-01-23: qty 2

## 2019-01-23 MED ORDER — SODIUM CHLORIDE 0.9 % IV SOLN
INTRAVENOUS | Status: DC
  Administered 2019-01-23: 09:00:00 via INTRAVENOUS

## 2019-01-23 MED ORDER — FLUMAZENIL 0.5 MG/5ML IV SOLN
INTRAVENOUS | Status: AC
  Filled 2019-01-23: qty 5

## 2019-01-23 MED ORDER — NALOXONE HCL 0.4 MG/ML IJ SOLN
INTRAMUSCULAR | Status: AC
  Filled 2019-01-23: qty 1

## 2019-01-23 MED ORDER — HEPARIN SOD (PORK) LOCK FLUSH 100 UNIT/ML IV SOLN
500.0000 [IU] | Freq: Once | INTRAVENOUS | Status: AC
  Administered 2019-01-23: 09:00:00 500 [IU] via INTRAVENOUS
  Filled 2019-01-23: qty 5

## 2019-01-23 MED ORDER — HYDRALAZINE HCL 50 MG PO TABS: 100.0000 mg | ORAL_TABLET | Freq: Three times a day (TID) | ORAL | 1 refills | Status: DC

## 2019-01-23 NOTE — H&P (Signed)
Chief Complaint: Patient was seen in consultation today for bone marrow biopsy at the request of Ennever,Peter R  Referring Physician(s): Ennever,Peter R  Supervising Physician: Sandi Mariscal  Patient Status: [facility] - Out-pt  History of Present Illness: Tiffany Leon is a 68 y.o. female with hx of Waldenstrom's macroglobulinemia. She is scheduled for follow up bone marrow biopsy. She last had one in July 2020, tolerated well. PMHx, meds, labs, imaging, allergies reviewed. Feels well, no recent fevers, chills, illness. Has been NPO today as directed.    Past Medical History:  Diagnosis Date  . Acute kidney injury ([place])    a. 09/2018  . Anxiety   . Aortic atherosclerosis ([place])    a. 09/2018 noted on Chest CT.  Marland Kitchen Chronic combined systolic (congestive) and diastolic (congestive) heart failure ([place])    a. 09/2018 Echo: EF 45-50%, diff HK. Triv to small circumfirential pericardial eff.  . Chronic DOE (dyspnea on exertion)   . Claudication [facility])    a. Bilat hip claudication since ~ 2019.  Marland Kitchen Depression   . Dyspnea   . Emphysema lung ([place])   . Exertional angina ([facility])    a. Ex angina since ~ 2019.  Marland Kitchen GERD (gastroesophageal reflux disease)   . Goals of care, counseling/discussion 11/03/2018  . Hiatal hernia   . History of bronchitis   . History of kidney stones   . Hypothyroidism   . Pleural effusion    a. 09/2018 s/p thoracentesis-->968m.  . Pleural effusion 09/2018  . Rash 10/2018   Rash began Saturday with unknown reason.  MD seen Monday 10/23/18  . Resting tremor   . Tobacco abuse   . Waldenstrom's macroglobulinemia ([facility] 11/03/2018    Past Surgical History:  Procedure Laterality Date  . BREAST LUMPECTOMY WITH RADIOACTIVE SEED LOCALIZATION Left 12/28/2017   Procedure: BREAST LUMPECTOMY WITH RADIOACTIVE SEED LOCALIZATION;  Surgeon: TJovita Kussmaul MD;  [place]  Service: General;  Laterality: Left;  . DG THUMB RIGHT HAND (ABeaverHX)    . IR IMAGING GUIDED PORT INSERTION   11/09/2018  . IR THORACENTESIS ASP PLEURAL SPACE W/IMG GUIDE  09/20/2018  . KIDNEY STONE SURGERY      Allergies: Codeine  Medications: Prior to Admission medications   Medication Sig Start Date End Date Taking? Authorizing Provider  amLODipine (NORVASC) 2.5 MG tablet Take 1 tablet (2.5 mg total) by mouth daily. 11/27/18 02/25/19  HPixie Casino MD  aspirin 81 MG chewable tablet Chew 1 tablet (81 mg total) by mouth daily. 09/25/18   MGeorgette Shell MD  atorvastatin (LIPITOR) 40 MG tablet Take 1 tablet (40 mg total) by mouth daily at 6 PM. 11/15/18   Duke, ATami Lin PA  carvedilol (COREG) 25 MG tablet Take 1 tablet (25 mg total) by mouth 2 (two) times daily with a meal. 11/15/18   Duke, ATami Lin PA  clonazePAM (KLONOPIN) 0.5 MG tablet TAKE ONE TABLET BY MOUTH TWICE DAILY 10/05/18   Cottle, CBilley Co, MD  clotrimazole (LOTRIMIN) 1 % cream Apply topically 2 (two) times daily. 09/24/18   MGeorgette Shell MD  famotidine (PEPCID) 40 MG tablet TAKE 1 TABLET BY MOUTH TWICE A DAY 01/15/19   EVolanda Napoleon MD  ferrous sulfate 325 (65 FE) MG tablet Take 1 tablet (325 mg total) by mouth daily with breakfast. 09/24/18   MGeorgette Shell MD  folic acid (FOLVITE) 1 MG tablet Take 1 tablet (1 mg total) by mouth daily. 09/24/18   MLandis Gandy  G, MD  furosemide (LASIX) 40 MG tablet Take 0.5 tablets (20 mg total) by mouth daily. Take 1/2 tablet (20 mg) by mouth in the morning and 1/2 tablet at lunch time. 12/28/18   Duke, Tami Lin, PA  hydrALAZINE (APRESOLINE) 50 MG tablet Take 2 tablets (100 mg total) by mouth 3 (three) times daily. 01/01/19   Duke, Tami Lin, PA  isosorbide mononitrate (IMDUR) 30 MG 24 hr tablet Take 1 tablet (30 mg total) by mouth daily. 10/13/18 10/08/19  Ledora Bottcher, PA  levothyroxine (SYNTHROID) 75 MCG tablet Take 75 mcg by mouth daily. 09/07/18   [provider]  lidocaine-prilocaine (EMLA) cream Apply to affected area once 11/07/18   [name], MD  mirabegron ER (MYRBETRIQ) 25 MG TB24 tablet Take 1 tablet (25 mg total) by mouth daily. 11/22/18   [name], MD  nicotine (NICODERM CQ - DOSED IN MG/24 HOURS) 21 mg/24hr patch Place 1 patch (21 mg total) onto the skin daily. 09/24/18   Georgette Shell, MD  ondansetron (ZOFRAN) 8 MG tablet Take 1 tablet (8 mg total) by mouth 2 (two) times daily as needed for refractory nausea / vomiting. Start on day 3 after chemotherapy. 11/07/18   [name], MD  PARoxetine (PAXIL) 40 MG tablet Take 60 mg by mouth every morning.    [provider]  potassium chloride (K-DUR) 10 MEQ tablet Take 1 tablet (10 mEq total) by mouth daily. 10/13/18   Duke, Tami Lin, PA  predniSONE (DELTASONE) 10 MG tablet TAKE 1 TABLET (10 MG TOTAL) BY MOUTH DAILY WITH BREAKFAST. 01/15/19   [name], MD  prochlorperazine (COMPAZINE) 10 MG tablet Take 1 tablet (10 mg total) by mouth every 6 (six) hours as needed (Nausea or vomiting). 12/14/18   [name], MD  traZODone (DESYREL) 50 MG tablet Take 1-2 tablets (50-100 mg total) by mouth at bedtime. 10/09/18   Cottle, Billey Co., MD     Family History  Problem Relation Age of Onset  . Stroke Mother        Mini-strokes. Died @ 78.  . Dementia Mother   . Lung cancer Father        Died in his 96's  . Addison's disease Sister   . Hypertension Brother   . CAD Brother   . Hypertension Brother     Social History   Socioeconomic History  . Marital status: Married    Spouse name: Not on file  . Number of children: Not on file  . Years of education: Not on file  . Highest education level: Not on file  Occupational History  . Not on file  Social Needs  . Financial resource strain: Not on file  . Food insecurity    Worry: Not on file    Inability: Not on file  . Transportation needs    Medical: Not on file    Non-medical: Not on file  Tobacco Use  . Smoking status: Current Every Day Smoker    Packs/day: 0.25    Years: 40.00     Pack years: 10.00    Types: Cigarettes  . Smokeless tobacco: Never Used  . Tobacco comment: smoking 3-4 cigarettes/day  Substance and Sexual Activity  . Alcohol use: Not Currently    Comment: 1-2 gl wine / night - none since 07/2018  . Drug use: Not Currently    Comment: prev used drugs ~ 35 yrs ago.  Marland Kitchen Sexual activity: Not on file  Lifestyle  . Physical activity    Days per week: Not on file    Minutes per session: Not on file  . Stress: Not on file  Relationships  . Social Herbalist on phone: Not on file    Gets together: Not on file    Attends religious service: Not on file    Active member of club or organization: Not on file    Attends meetings of clubs or organizations: Not on file    Relationship status: Not on file  Other Topics Concern  . Not on file  Social History Narrative   Lives in [place] with her husband.  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    Review of Systems: A 12 point ROS discussed and pertinent positives are indicated in the HPI above.  All other systems are negative.  Review of Systems  Vital Signs: T: 97.7, HR: 85, BP: 152/78  Physical Exam Constitutional:      Appearance: Normal appearance.  HENT:     Mouth/Throat:     Mouth: Mucous membranes are moist.     Pharynx: Oropharynx is clear.  Cardiovascular:     Rate and Rhythm: Normal rate and regular rhythm.     Heart sounds: Normal heart sounds.  Pulmonary:     Effort: Pulmonary effort is normal. No respiratory distress.     Breath sounds: Normal breath sounds.  Skin:    General: Skin is warm and dry.  Neurological:     General: No focal deficit present.     Mental Status: She is alert and oriented to person, place, and time.  Psychiatric:        Mood and Affect: Mood normal.        Judgment: Judgment normal.      Imaging: No results found.  Labs:  CBC: Recent Labs    12/01/18 1035 12/22/18 1020 01/12/19 0950 01/23/19 0723  WBC 4.2 8.4 7.3 3.1*   HGB 12.3 10.1* 10.5* 9.6*  HCT 38.2 31.3* 32.5* 29.7*  PLT 147* 239 260 149*    COAGS: Recent Labs    10/26/18 0730 11/09/18 1015  INR 0.9 1.0    BMP: Recent Labs    11/21/18 1015 12/01/18 1035 12/22/18 1020 01/12/19 0950  NA 139 145 141 138  K 4.1 4.5 4.2 4.2  CL 108 115* 110 102  CO2 21* 23 23 26   GLUCOSE 132* 94 111* 134*  BUN 34* 27* 24* 36*  CALCIUM 8.3* 8.3* 9.0 9.4  CREATININE 1.48* 1.10* 1.08* 1.54*  GFRNONAA 36* 51* 52* 34*  GFRAA 41* 59* >60 39*    LIVER FUNCTION TESTS: Recent Labs    11/21/18 1015 12/01/18 1035 12/22/18 1020 01/12/19 0950  BILITOT 0.3 0.4 0.4 0.5  AST 14* 17 17 21   ALT 25 22 22 20   ALKPHOS 46 50 48 55  PROT 4.9* 4.6* 5.1* 5.8*  ALBUMIN 3.1* 3.0* 3.4* 3.9    TUMOR MARKERS: No results for input(s): AFPTM, CEA, CA199, CHROMGRNA in the last 8760 hours.  Assessment and Plan: Waldenstrom's macroglobulinemia  For CT guided bone marrow biopsy. Risks and benefits of bone marrow biopsy was discussed with the patient and/or patient's family including, but not limited to bleeding, infection, damage to adjacent structures or low yield requiring additional tests.  All of the questions were answered and there is agreement to proceed.  Consent signed and in chart.    Thank you for this interesting consult.  I greatly enjoyed  meeting Kirbie Malecki and look forward to participating in their care.  A copy of this report was sent to the requesting provider on this date.  Electronically Signed: Ascencion Dike, PA-C 01/23/2019, 8:09 AM   I spent a total of 20 minutes in face to face in clinical consultation, greater than 50% of which was counseling/coordinating care for bone marrow biopsy

## 2019-01-23 NOTE — Discharge Instructions (Signed)
Moderate Conscious Sedation, Adult, Care After These instructions provide you with information about caring for yourself after your procedure. Your health care provider may also give you more specific instructions. Your treatment has been planned according to current medical practices, but problems sometimes occur. Call your health care provider if you have any problems or questions after your procedure. What can I expect after the procedure? After your procedure, it is common:  To feel sleepy for several hours.  To feel clumsy and have poor balance for several hours.  To have poor judgment for several hours.  To vomit if you eat too soon. Follow these instructions at home: For at least 24 hours after the procedure:   Do not: ? Participate in activities where you could fall or become injured. ? Drive. ? Use heavy machinery. ? Drink alcohol. ? Take sleeping pills or medicines that cause drowsiness. ? Make important decisions or sign legal documents. ? Take care of children on your own.  Rest. Eating and drinking  Follow the diet recommended by your health care provider.  If you vomit: ? Drink water, juice, or soup when you can drink without vomiting. ? Make sure you have little or no nausea before eating solid foods. General instructions  Have a responsible adult stay with you until you are awake and alert.  Take over-the-counter and prescription medicines only as told by your health care provider.  If you smoke, do not smoke without supervision.  Keep all follow-up visits as told by your health care provider. This is important. Contact a health care provider if:  You keep feeling nauseous or you keep vomiting.  You feel light-headed.  You develop a rash.  You have a fever. Get help right away if:  You have trouble breathing. This information is not intended to replace advice given to you by your health care provider. Make sure you discuss any questions you have  with your health care provider. Document Released: 01/24/2013 Document Revised: 03/18/2017 Document Reviewed: 07/26/2015 Elsevier Patient Education  2020 [place].   Bone Marrow Aspiration and Bone Marrow Biopsy, Adult, Care After This sheet gives you information about how to care for yourself after your procedure. Your health care provider may also give you more specific instructions. If you have problems or questions, contact your health care provider. What can I expect after the procedure? After the procedure, it is common to have:  Mild pain and tenderness.  Swelling.  Bruising. Follow these instructions at home: Puncture site care   Follow instructions from your health care provider about how to take care of the puncture site. Make sure you: ? Wash your hands with soap and water before you change your bandage (dressing). If soap and water are not available, use hand sanitizer. ? Change your dressing as told by your health care provider. May remove bandaid or dressing in 24 hours and shower.  Keep site clean and dry.    Check your puncture siteevery day for signs of infection. Check for: ? More redness, swelling, or pain. ? More fluid or blood. ? Warmth. ? Pus or a bad smell. ? Report signs of infection to MD General instructions  Take over-the-counter and prescription medicines only as told by your health care provider.  Do not take baths, swim, or use a hot tub until your health care provider approves. Ask if you can take a shower or have a sponge bath.  Return to your normal activities as told by your health care  provider. Ask your health care provider what activities are safe for you.  Do not drive for 24 hours if you were given a medicine to help you relax (sedative) during your procedure.  Keep all follow-up visits as told by your health care provider. This is important. Contact a health care provider if:  Your pain is not controlled with medicine. Get help  right away if:  You have a fever.  You have more redness, swelling, or pain around the puncture site.  You have more fluid or blood coming from the puncture site.  Your puncture site feels warm to the touch.  You have pus or a bad smell coming from the puncture site. These symptoms may represent a serious problem that is an emergency. Do not wait to see if the symptoms will go away. Get medical help right away. Call your local emergency services (911 in the [place] Do not drive yourself to the hospital. Summary  After the procedure, it is common to have mild pain, tenderness, swelling, and bruising.  Follow instructions from your health care provider about how to take care of the puncture site.  Get help right away if you have any symptoms of infection or if you have more blood or fluid coming from the puncture site. This information is not intended to replace advice given to you by your health care provider. Make sure you discuss any questions you have with your health care provider. Document Released: 10/23/2004 Document Revised: 07/19/2017 Document Reviewed: 09/17/2015 Elsevier Patient Education  2020 [employer].

## 2019-01-23 NOTE — Procedures (Signed)
Pre-procedure Diagnosis: Waldenstrom's macroglobulinemia Post-procedure Diagnosis: Same  Technically successful CT guided bone marrow aspiration and biopsy of left iliac crest.   Complications: None Immediate  EBL: None  Signed: Sandi Mariscal Pager: [phone] 01/23/2019, 9:43 AM

## 2019-01-26 LAB — SURGICAL PATHOLOGY

## 2019-01-29 ENCOUNTER — Encounter: Payer: Self-pay | Admitting: Internal Medicine

## 2019-01-29 ENCOUNTER — Ambulatory Visit (INDEPENDENT_AMBULATORY_CARE_PROVIDER_SITE_OTHER): Payer: Medicare Other | Admitting: Internal Medicine

## 2019-01-29 ENCOUNTER — Other Ambulatory Visit: Payer: Self-pay

## 2019-01-29 VITALS — BP 120/75 | HR 84 | Ht 66.0 in | Wt 115.4 lb

## 2019-01-29 DIAGNOSIS — I2510 Atherosclerotic heart disease of native coronary artery without angina pectoris: Secondary | ICD-10-CM

## 2019-01-29 DIAGNOSIS — N183 Chronic kidney disease, stage 3 unspecified: Secondary | ICD-10-CM | POA: Diagnosis not present

## 2019-01-29 DIAGNOSIS — I5022 Chronic systolic (congestive) heart failure: Secondary | ICD-10-CM | POA: Diagnosis not present

## 2019-01-29 DIAGNOSIS — I2584 Coronary atherosclerosis due to calcified coronary lesion: Secondary | ICD-10-CM

## 2019-01-29 NOTE — Patient Instructions (Signed)
Medication Instructions:  Dr Debara Pickett recommends that you continue on your current medications as directed. Please refer to the Current Medication list given to you today.  If you need a refill on your cardiac medications before your next appointment, please call your pharmacy.   Testing/Procedures: Your physician has ordered a Lexiscan Myocardial Perfusion Imaging Study.  Please arrive 15 minutes prior to your appointment time for registration and insurance purposes.   The test will take approximately 3 to 4 hours to complete; you may bring reading material.  If someone comes with you to your appointment, they will need to remain in the main lobby due to limited space in the testing area. **If you are pregnant or breastfeeding, please notify the nuclear lab prior to your appointment**   How to prepare for your Myocardial Perfusion Test:  Do not eat or drink 3 hours prior to your test, except you may have water.  Do not consume products containing caffeine (regular or decaffeinated) 12 hours prior to your test. (ex: coffee, chocolate, sodas, tea).  Do wear comfortable clothes (no dresses or overalls) and walking shoes, tennis shoes preferred (No heels or open toe shoes are allowed).  Do NOT wear cologne, perfume, aftershave, or lotions (deodorant is allowed).  If you use an inhaler, use it the AM of your test and bring it with you.   If you use a nebulizer, use it the AM of your test.   If these instructions are not followed, your test will have to be rescheduled.  Follow-Up: Dr Debara Pickett recommends that you schedule a VIRTUAL follow-up appointment in 1 month.

## 2019-01-29 NOTE — Progress Notes (Signed)
OFFICE NOTE  Chief Complaint:  Fatigue, weakness, joint pain  Primary Care Physician: Aretta Nip, MD  HPI:  Tiffany Leon is a 68 y.o. female with a past medial history significant for chronic combined systolic and diastolic congestive heart failure with recent echo in June 2020 showed EF 45 to 50% and diffuse hypokinesis with trivial to small pericardial effusion, aortic atherosclerosis and coronary artery calcifications noted on chest CT, acute kidney injury, pleural effusion status post thoracentesis, recent elevated sedimentation rate greater than 100 and other medical problems.  She was seen by me recently in the hospital for heart failure.  Initially she had acute renal failure and markedly elevated sedimentation rate.  She responded to diuresis with a plan for outpatient medication adjustment and ischemia work-up.  She was seen in a telemedicine visit by Doreene Adas, PA-C on 10/13/2018.  Blood pressure and still not been well controlled.  She was placed on BiDil in the hospital however this was cost effective and therefore was changed to hydralazine and Imdur.  Today her blood pressure remains elevated 168/88.  She is accompanied by her husband who notes that her blood pressures were quite high at home up to 161 systolic.  She says she feels poorly.  She was in a wheelchair and cannot walk up into the office today.  She gets chills but no fevers.  Recent COVID testing was negative in the hospital.  She has seen Dr. Marin Olp with oncology who feels that she may have either hematologic malignancy and/or rheumatoid arthritis.  She did have a high RF test and given her high sedimentation rate this is a reasonable differential diagnosis.  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  Pan CT scans are pending as well as rheumatologic referral.  Heart failure wise she denies any worsening edema.  Her weight is much lower than dry weight by about 7 pounds today.  Appetite is  decreased.  Remains on the diuretic.  Multiple labs ordered by her oncologist about 4 days ago are pending.  01/29/2019  [name] returns today for follow-up.  She apparently saw Dr. Trudie Reed with rheumatology this morning, who felt that she did not have clinical joint symptoms consistent with RA however she was on treatment for her Shea Evans which is also treatment for RA.  Symptomatically she is improved somewhat.  Her oncologist feels that she could undergo ischemic evaluation.  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  PMHx:  Past Medical History:  Diagnosis Date   Acute kidney injury ([place])    a. 09/2018   Anxiety    Aortic atherosclerosis ([place])    a. 09/2018 noted on Chest CT.   Chronic combined systolic (congestive) and diastolic (congestive) heart failure ([place])    a. 09/2018 Echo: EF 45-50%, diff HK. Triv to small circumfirential pericardial eff.   Chronic DOE (dyspnea on exertion)    Claudication ([place])    a. Bilat hip claudication since ~ 2019.   Depression    Dyspnea    Emphysema lung ([place])    Exertional angina ([place])    a. Ex angina since ~ 2019.   GERD (gastroesophageal reflux disease)    Goals of care, counseling/discussion 11/03/2018   Hiatal hernia    History of bronchitis    History of kidney stones    Hypothyroidism    Pleural effusion    a. 09/2018 s/p thoracentesis-->976m.   Pleural  effusion 09/2018   Rash 10/2018   Rash began Saturday with unknown reason.  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 Died @ 43.   Dementia Mother    Lung cancer Father        Died in his 73's   Addison's disease Sister    Hypertension Brother    CAD Brother    Hypertension Brother     SOCHx:   reports that she has been smoking cigarettes. She has a 10.00 pack-year smoking history. She has never used smokeless tobacco. She reports previous alcohol use. She reports previous drug use.  ALLERGIES:  Allergies  Allergen Reactions   Codeine Nausea And Vomiting    ROS: Pertinent items noted in HPI and remainder of comprehensive ROS otherwise negative.  HOME MEDS: Current Outpatient Medications on File Prior to Visit  Medication Sig Dispense Refill   amLODipine (NORVASC) 2.5 MG tablet Take 1 tablet (2.5 mg total) by mouth daily. 180 tablet 1   aspirin 81 MG chewable tablet Chew 1 tablet (81 mg total) by mouth daily.     atorvastatin (LIPITOR) 40 MG tablet Take 1 tablet (40 mg total) by mouth daily at 6 PM. 90 tablet 3   carvedilol (COREG) 25 MG tablet Take 1 tablet (25 mg total) by mouth 2 (two) times daily with a meal. 60 tablet 11   clonazePAM (KLONOPIN) 0.5 MG tablet TAKE ONE TABLET BY MOUTH TWICE DAILY 180 tablet 0   clotrimazole (LOTRIMIN) 1 % cream Apply topically 2 (two) times daily. 30 g 0   famotidine (PEPCID) 40 MG tablet TAKE 1 TABLET BY MOUTH TWICE A DAY 876 tablet 0   folic acid (FOLVITE) 1 MG tablet Take 1 tablet (1 mg total) by mouth daily.     furosemide (LASIX) 40 MG tablet Take 0.5 tablets (20 mg total) by mouth daily. Take 1/2 tablet (20 mg) by mouth in the morning and 1/2 tablet at lunch time. 90 tablet 1   hydrALAZINE (APRESOLINE) 50 MG tablet Take 2 tablets (100 mg total) by mouth 3 (three) times daily. 180 tablet 1    isosorbide mononitrate (IMDUR) 30 MG 24 hr tablet Take 1 tablet (30 mg total) by mouth daily. 90 tablet 3   levothyroxine (SYNTHROID) 75 MCG tablet Take 75 mcg by mouth daily.     PARoxetine (PAXIL) 40 MG tablet Take 60 mg by mouth every morning.     potassium chloride (K-DUR) 10 MEQ tablet Take 1 tablet (10 mEq total) by mouth daily. 90 tablet 3   predniSONE (DELTASONE) 10 MG tablet TAKE 1 TABLET (10 MG TOTAL) BY MOUTH DAILY WITH BREAKFAST. 30 tablet 0   prochlorperazine (COMPAZINE) 10 MG tablet Take 1 tablet (10 mg total) by mouth every 6 (six) hours as needed (Nausea or vomiting). [place]  tablet 1   traZODone (DESYREL) 50 MG tablet Take 1-2 tablets (50-100 mg total) by mouth at bedtime. 180 tablet 1   No current facility-administered medications on file prior to visit.     LABS/IMAGING: No results found for this or any previous visit (from the past 48 hour(s)). No results found.  LIPID PANEL:    Component Value Date/Time   CHOL 121 09/28/2018 0328   TRIG 102 09/28/2018 0328   HDL 29 (L) 09/28/2018 0328   CHOLHDL 4.2 09/28/2018 0328   VLDL 20 09/28/2018 0328   LDLCALC 72 09/28/2018 0328     WEIGHTS: Wt Readings from Last 3 Encounters:  01/29/19 115 lb 6.4 oz (52.3 kg)  01/12/19 116 lb 4 oz (52.7 kg)  12/28/18 123 lb (55.8 kg)    VITALS: BP 120/75    Pulse 84    Ht 5' 6"  (1.676 m)    Wt 115 lb 6.4 oz (52.3 kg)    SpO2 97%    BMI 18.63 kg/m   EXAM: General appearance: alert, no distress and Appears thin and fatigued in wheelchair Neck: no carotid bruit, no JVD and thyroid not enlarged, symmetric, no tenderness/mass/nodules Lungs: diminished breath sounds bilaterally Heart: regular rate and rhythm Abdomen: soft, non-tender; bowel sounds normal; no masses,  no organomegaly Extremities: extremities normal, atraumatic, no cyanosis or edema Pulses: 2+ and symmetric Skin: Pale, cool, dry Neurologic: Mental status: Alert, oriented, thought content appropriate Psych:  Fatigue  EKG: Deferred  ASSESSMENT: 1. Acute on chronic systolic congestive heart failure-LVEF 45% 2. Multivessel coronary artery calcification 3. Elevated ESR-possible rheumatoid arthritis versus malignancy 4. Recent acute kidney injury  PLAN: 1.   Ms. Buehrer has multivessel coronary calcification and EF 45%.  I suspect this is nonischemic however would recommend a noninvasive ischemia evaluation with a Lexiscan Myoview.  Plan follow-up closely in 1 month.  Pixie Casino, MD, [facility], [place] Director of the [facility]  [facility] Diplomate of the American Board of Clinical Lipidology Attending Cardiologist  Direct Dial: [phone]   Fax: [phone]  Website:  [email or web address] 01/29/2019, 2:45 PM

## 2019-01-30 ENCOUNTER — Other Ambulatory Visit: Payer: Self-pay | Admitting: Hematology & Oncology

## 2019-01-30 ENCOUNTER — Telehealth (HOSPITAL_COMMUNITY): Payer: Self-pay

## 2019-01-30 ENCOUNTER — Other Ambulatory Visit: Payer: Self-pay | Admitting: Psychiatry

## 2019-01-30 ENCOUNTER — Encounter: Payer: Self-pay | Admitting: Internal Medicine

## 2019-01-30 ENCOUNTER — Encounter (HOSPITAL_COMMUNITY): Payer: Self-pay | Admitting: Hematology & Oncology

## 2019-01-30 NOTE — Telephone Encounter (Signed)
Encounter complete. 

## 2019-01-31 NOTE — Telephone Encounter (Signed)
Next appt 06/2019

## 2019-02-01 ENCOUNTER — Other Ambulatory Visit: Payer: Self-pay

## 2019-02-01 ENCOUNTER — Ambulatory Visit (HOSPITAL_COMMUNITY)
Admission: RE | Admit: 2019-02-01 | Discharge: 2019-02-01 | Disposition: A | Discharge status: Home / Self Care | Payer: Medicare Other | Source: Ambulatory Visit | Attending: Cardiology | Admitting: Cardiology

## 2019-02-01 DIAGNOSIS — I2510 Atherosclerotic heart disease of native coronary artery without angina pectoris: Secondary | ICD-10-CM

## 2019-02-01 DIAGNOSIS — I2584 Coronary atherosclerosis due to calcified coronary lesion: Secondary | ICD-10-CM | POA: Diagnosis not present

## 2019-02-01 DIAGNOSIS — I5022 Chronic systolic (congestive) heart failure: Secondary | ICD-10-CM

## 2019-02-01 LAB — MYOCARDIAL PERFUSION IMAGING
LV dias vol: 87 mL (ref 46–106)
LV sys vol: 44 mL
Peak HR: 88 {beats}/min
Rest HR: 69 {beats}/min
SDS: 0
SRS: 1
SSS: 1
TID: 1.04

## 2019-02-01 MED ORDER — AMINOPHYLLINE 25 MG/ML IV SOLN
75.0000 mg | Freq: Once | INTRAVENOUS | Status: AC
  Administered 2019-02-01: 75 mg via INTRAVENOUS

## 2019-02-01 MED ORDER — TECHNETIUM TC 99M TETROFOSMIN IV KIT
31.5000 | PACK | Freq: Once | INTRAVENOUS | Status: AC | PRN
  Administered 2019-02-01: 31.5 via INTRAVENOUS
  Filled 2019-02-01: qty 32

## 2019-02-01 MED ORDER — TECHNETIUM TC 99M TETROFOSMIN IV KIT
10.2000 | PACK | Freq: Once | INTRAVENOUS | Status: AC | PRN
  Administered 2019-02-01: 10.2 via INTRAVENOUS
  Filled 2019-02-01: qty 11

## 2019-02-01 MED ORDER — REGADENOSON 0.4 MG/5ML IV SOLN
0.4000 mg | Freq: Once | INTRAVENOUS | Status: AC
  Administered 2019-02-01: 0.4 mg via INTRAVENOUS

## 2019-02-09 ENCOUNTER — Telehealth: Payer: Self-pay | Admitting: *Deleted

## 2019-02-09 NOTE — Telephone Encounter (Signed)
Message received from patient's brother Tiffany Leon requesting a call back from Dr. Marin Olp regarding patient's BM biopsy results.  Call placed back to [facility] to notify him that Dr. Marin Olp is out of the office this week and call will be returned to him on Monday regarding biopsy results.  Tiffany Leon is appreciative of call back and has no further questions or concerns at this time.

## 2019-02-11 ENCOUNTER — Other Ambulatory Visit: Payer: Self-pay | Admitting: Hematology & Oncology

## 2019-02-12 ENCOUNTER — Other Ambulatory Visit: Payer: Self-pay | Admitting: Hematology & Oncology

## 2019-02-13 ENCOUNTER — Other Ambulatory Visit: Payer: Self-pay | Admitting: *Deleted

## 2019-02-13 MED ORDER — FAMOTIDINE 40 MG PO TABS: 40.0000 mg | ORAL_TABLET | Freq: Two times a day (BID) | ORAL | 6 refills | Status: DC

## 2019-02-19 ENCOUNTER — Telehealth: Payer: Self-pay | Admitting: Internal Medicine

## 2019-02-19 NOTE — Telephone Encounter (Signed)
Patient states she has had some dizzy spells for about 1 month- she mostly has it in the morning;  Patient denies chest pain, SOB, or swelling. Patient does mention having waves of dizziness that come over here randomly- does not notice if she stands. Patient denies palpitations- or flutter feeling in chest.  Amlodipine in the morning- aspirin in the morning-  Hydralazine is taken in the morning, afternoon and evening. She takes the Isosorbide in the afternoon-  I advised patient to make sure she was staying hydrated- monitor when the dizziness occurs- advised to stand slowly and not to do any strenuous activities. Advised I would route message to PharmD and MD for any other recommendations. Patient has appointment on 11/12 with Dr.Hilty.

## 2019-02-19 NOTE — Telephone Encounter (Signed)
I would decrease her hydralazine to 50 mg (1 tab) TID - monitor BP. Will review numbers at follow-up on 11/12.  Dr Lemmie Evens

## 2019-02-19 NOTE — Telephone Encounter (Signed)
STAT if patient feels like he/she is going to faint   1) Are you dizzy now? no  2) Do you feel faint or have you passed out? no  3) Do you have any other symptoms? no  4) Have you checked your HR and BP (record if available)?   02/10/19: 104/60  02/11/19: 148/72  02/12/19: 109/69  02/15/19: 106/49       120/74 later in the day 10/29  02/19/19: 137/74  Patient reports having dizzy spells usually after she takes her noon medications. It happened yesterday morning, then she just felt off the rest of the day yesterday. She is usually standing near the stove and is able to make it to the couch to sit down. She thinks her medications may just need to be adjusted.

## 2019-02-19 NOTE — Telephone Encounter (Signed)
Called patient- advised of note from MD. Patient verbalized understanding.

## 2019-02-20 ENCOUNTER — Other Ambulatory Visit: Payer: Self-pay

## 2019-02-23 ENCOUNTER — Inpatient Hospital Stay: Payer: Medicare Other

## 2019-02-23 ENCOUNTER — Inpatient Hospital Stay: Payer: Medicare Other | Attending: Hematology & Oncology | Admitting: Hematology & Oncology

## 2019-02-23 ENCOUNTER — Other Ambulatory Visit: Payer: Self-pay

## 2019-02-23 VITALS — BP 136/63 | HR 73 | Temp 97.5°F | Resp 17 | Wt 113.0 lb

## 2019-02-23 VITALS — BP 101/57 | HR 65 | Temp 97.7°F | Resp 17

## 2019-02-23 DIAGNOSIS — D631 Anemia in chronic kidney disease: Secondary | ICD-10-CM | POA: Diagnosis not present

## 2019-02-23 DIAGNOSIS — M069 Rheumatoid arthritis, unspecified: Secondary | ICD-10-CM | POA: Insufficient documentation

## 2019-02-23 DIAGNOSIS — Z5112 Encounter for antineoplastic immunotherapy: Secondary | ICD-10-CM | POA: Diagnosis not present

## 2019-02-23 DIAGNOSIS — N1831 Chronic kidney disease, stage 3a: Secondary | ICD-10-CM | POA: Diagnosis not present

## 2019-02-23 DIAGNOSIS — N189 Chronic kidney disease, unspecified: Secondary | ICD-10-CM | POA: Diagnosis not present

## 2019-02-23 DIAGNOSIS — Z79899 Other long term (current) drug therapy: Secondary | ICD-10-CM | POA: Diagnosis not present

## 2019-02-23 DIAGNOSIS — C8800 Waldenstrom macroglobulinemia not having achieved remission: Secondary | ICD-10-CM

## 2019-02-23 DIAGNOSIS — N183 Chronic kidney disease, stage 3 unspecified: Secondary | ICD-10-CM

## 2019-02-23 LAB — CBC WITH DIFFERENTIAL (CANCER CENTER ONLY)
Abs Immature Granulocytes: 0.34 10*3/uL — ABNORMAL HIGH (ref 0.00–0.07)
Basophils Absolute: 0 10*3/uL (ref 0.0–0.1)
Basophils Relative: 0 %
Eosinophils Absolute: 0 10*3/uL (ref 0.0–0.5)
Eosinophils Relative: 0 %
HCT: 30.4 % — ABNORMAL LOW (ref 36.0–46.0)
Hemoglobin: 10.3 g/dL — ABNORMAL LOW (ref 12.0–15.0)
Immature Granulocytes: 5 %
Lymphocytes Relative: 13 %
Lymphs Abs: 1 10*3/uL (ref 0.7–4.0)
MCH: 33.9 pg (ref 26.0–34.0)
MCHC: 33.9 g/dL (ref 30.0–36.0)
MCV: 100 fL (ref 80.0–100.0)
Monocytes Absolute: 0.2 10*3/uL (ref 0.1–1.0)
Monocytes Relative: 2 %
Neutro Abs: 5.8 10*3/uL (ref 1.7–7.7)
Neutrophils Relative %: 80 %
Platelet Count: 254 10*3/uL (ref 150–400)
RBC: 3.04 MIL/uL — ABNORMAL LOW (ref 3.87–5.11)
RDW: 15.2 % (ref 11.5–15.5)
WBC Count: 7.4 10*3/uL (ref 4.0–10.5)
nRBC: 0 % (ref 0.0–0.2)

## 2019-02-23 LAB — CMP (CANCER CENTER ONLY)
ALT: 23 U/L (ref 0–44)
AST: 21 U/L (ref 15–41)
Albumin: 4.7 g/dL (ref 3.5–5.0)
Alkaline Phosphatase: 55 U/L (ref 38–126)
Anion gap: 11 (ref 5–15)
BUN: 33 mg/dL — ABNORMAL HIGH (ref 8–23)
CO2: 29 mmol/L (ref 22–32)
Calcium: 10.2 mg/dL (ref 8.9–10.3)
Chloride: 98 mmol/L (ref 98–111)
Creatinine: 1.39 mg/dL — ABNORMAL HIGH (ref 0.44–1.00)
GFR, Est AFR Am: 44 mL/min — ABNORMAL LOW (ref >60)
GFR, Estimated: 38 mL/min — ABNORMAL LOW (ref >60)
Glucose, Bld: 126 mg/dL — ABNORMAL HIGH (ref 70–99)
Potassium: 3.6 mmol/L (ref 3.5–5.1)
Sodium: 138 mmol/L (ref 135–145)
Total Bilirubin: 0.4 mg/dL (ref 0.3–1.2)
Total Protein: 6.2 g/dL — ABNORMAL LOW (ref 6.5–8.1)

## 2019-02-23 MED ORDER — EPOETIN ALFA-EPBX 40000 UNIT/ML IJ SOLN
40000.0000 [IU] | Freq: Once | INTRAMUSCULAR | Status: AC
  Administered 2019-02-23: 40000 [IU] via SUBCUTANEOUS

## 2019-02-23 MED ORDER — DIPHENHYDRAMINE HCL 25 MG PO CAPS
ORAL_CAPSULE | ORAL | Status: AC
  Filled 2019-02-23: qty 2

## 2019-02-23 MED ORDER — ACETAMINOPHEN 325 MG PO TABS
ORAL_TABLET | ORAL | Status: AC
  Filled 2019-02-23: qty 2

## 2019-02-23 MED ORDER — SODIUM CHLORIDE 0.9 % IV SOLN
375.0000 mg/m2 | Freq: Once | INTRAVENOUS | Status: AC
  Administered 2019-02-23: 600 mg via INTRAVENOUS
  Filled 2019-02-23: qty 50

## 2019-02-23 MED ORDER — HEPARIN SOD (PORK) LOCK FLUSH 100 UNIT/ML IV SOLN
500.0000 [IU] | Freq: Once | INTRAVENOUS | Status: AC | PRN
  Administered 2019-02-23: 500 [IU]
  Filled 2019-02-23: qty 5

## 2019-02-23 MED ORDER — SODIUM CHLORIDE 0.9 % IV SOLN
Freq: Once | INTRAVENOUS | Status: AC
  Administered 2019-02-23: 13:00:00 via INTRAVENOUS
  Filled 2019-02-23: qty 250

## 2019-02-23 MED ORDER — DIPHENHYDRAMINE HCL 25 MG PO CAPS
50.0000 mg | ORAL_CAPSULE | Freq: Once | ORAL | Status: AC
  Administered 2019-02-23: 50 mg via ORAL

## 2019-02-23 MED ORDER — SODIUM CHLORIDE 0.9% FLUSH
10.0000 mL | INTRAVENOUS | Status: DC | PRN
  Administered 2019-02-23: 10 mL
  Filled 2019-02-23: qty 10

## 2019-02-23 MED ORDER — HYDRALAZINE HCL 50 MG PO TABS: 100.0000 mg | ORAL_TABLET | Freq: Three times a day (TID) | ORAL | 1 refills | Status: DC

## 2019-02-23 MED ORDER — ACETAMINOPHEN 325 MG PO TABS
650.0000 mg | ORAL_TABLET | Freq: Once | ORAL | Status: AC
  Administered 2019-02-23: 650 mg via ORAL

## 2019-02-23 MED ORDER — EPOETIN ALFA-EPBX 40000 UNIT/ML IJ SOLN
INTRAMUSCULAR | Status: AC
  Filled 2019-02-23: qty 1

## 2019-02-23 NOTE — Patient Instructions (Signed)

## 2019-02-23 NOTE — Patient Instructions (Signed)
Epoetin Alfa injection What is this medicine? EPOETIN ALFA (e POE e tin AL fa) helps your body make more red blood cells. This medicine is used to treat anemia caused by chronic kidney disease, cancer chemotherapy, or HIV-therapy. It may also be used before surgery if you have anemia. This medicine may be used for other purposes; ask your health care provider or pharmacist if you have questions. COMMON BRAND NAME(S): Epogen, Procrit, Retacrit What should I tell my health care provider before I take this medicine? They need to know if you have any of these conditions:  cancer  heart disease  high blood pressure  history of blood clots  history of stroke  low levels of folate, iron, or vitamin B12 in the blood  seizures  an unusual or allergic reaction to erythropoietin, albumin, benzyl alcohol, hamster proteins, other medicines, foods, dyes, or preservatives  pregnant or trying to get pregnant  breast-feeding How should I use this medicine? This medicine is for injection into a vein or under the skin. It is usually given by a health care professional in a hospital or clinic setting. If you get this medicine at home, you will be taught how to prepare and give this medicine. Use exactly as directed. Take your medicine at regular intervals. Do not take your medicine more often than directed. It is important that you put your used needles and syringes in a special sharps container. Do not put them in a trash can. If you do not have a sharps container, call your pharmacist or healthcare provider to get one. A special MedGuide will be given to you by the pharmacist with each prescription and refill. Be sure to read this information carefully each time. Talk to your pediatrician regarding the use of this medicine in children. While this drug may be prescribed for selected conditions, precautions do apply. Overdosage: If you think you have taken too much of this medicine contact a poison  control center or emergency room at once. NOTE: This medicine is only for you. Do not share this medicine with others. What if I miss a dose? If you miss a dose, take it as soon as you can. If it is almost time for your next dose, take only that dose. Do not take double or extra doses. What may interact with this medicine? Interactions have not been studied. This list may not describe all possible interactions. Give your health care provider a list of all the medicines, herbs, non-prescription drugs, or dietary supplements you use. Also tell them if you smoke, drink alcohol, or use illegal drugs. Some items may interact with your medicine. What should I watch for while using this medicine? Your condition will be monitored carefully while you are receiving this medicine. You may need blood work done while you are taking this medicine. This medicine may cause a decrease in vitamin B6. You should make sure that you get enough vitamin B6 while you are taking this medicine. Discuss the foods you eat and the vitamins you take with your health care professional. What side effects may I notice from receiving this medicine? Side effects that you should report to your doctor or health care professional as soon as possible:  allergic reactions like skin rash, itching or hives, swelling of the face, lips, or tongue  seizures  signs and symptoms of a blood clot such as breathing problems; changes in vision; chest pain; severe, sudden headache; pain, swelling, warmth in the leg; trouble speaking; sudden numbness or   weakness of the face, arm or leg  signs and symptoms of a stroke like changes in vision; confusion; trouble speaking or understanding; severe headaches; sudden numbness or weakness of the face, arm or leg; trouble walking; dizziness; loss of balance or coordination Side effects that usually do not require medical attention (report to your doctor or health care professional if they continue or are  bothersome):  chills  cough  dizziness  fever  headaches  joint pain  muscle cramps  muscle pain  nausea, vomiting  pain, redness, or irritation at site where injected This list may not describe all possible side effects. Call your doctor for medical advice about side effects. You may report side effects to FDA at 1-800-FDA-1088. Where should I keep my medicine? Keep out of the reach of children. Store in a refrigerator between 2 and 8 degrees C (36 and 46 degrees F). Do not freeze or shake. Throw away any unused portion if using a single-dose vial. Multi-dose vials can be kept in the refrigerator for up to 21 days after the initial dose. Throw away unused medicine. NOTE: This sheet is a summary. It may not cover all possible information. If you have questions about this medicine, talk to your doctor, pharmacist, or health care provider.  2020 Elsevier/Gold Standard (2016-11-12 08:35:19) Rituximab injection What is this medicine? RITUXIMAB (ri TUX i mab) is a monoclonal antibody. It is used to treat certain types of cancer like non-Hodgkin lymphoma and chronic lymphocytic leukemia. It is also used to treat rheumatoid arthritis, granulomatosis with polyangiitis (or Wegener's granulomatosis), microscopic polyangiitis, and pemphigus vulgaris. This medicine may be used for other purposes; ask your health care provider or pharmacist if you have questions. COMMON BRAND NAME(S): Rituxan, RUXIENCE What should I tell my health care provider before I take this medicine? They need to know if you have any of these conditions:  heart disease  infection (especially a virus infection such as hepatitis B, chickenpox, cold sores, or herpes)  immune system problems  irregular heartbeat  kidney disease  low blood counts, like low white cell, platelet, or red cell counts  lung or breathing disease, like asthma  recently received or scheduled to receive a vaccine  an unusual or  allergic reaction to rituximab, other medicines, foods, dyes, or preservatives  pregnant or trying to get pregnant  breast-feeding How should I use this medicine? This medicine is for infusion into a vein. It is administered in a hospital or clinic by a specially trained health care professional. A special MedGuide will be given to you by the pharmacist with each prescription and refill. Be sure to read this information carefully each time. Talk to your pediatrician regarding the use of this medicine in children. This medicine is not approved for use in children. Overdosage: If you think you have taken too much of this medicine contact a poison control center or emergency room at once. NOTE: This medicine is only for you. Do not share this medicine with others. What if I miss a dose? It is important not to miss a dose. Call your doctor or health care professional if you are unable to keep an appointment. What may interact with this medicine?  cisplatin  live virus vaccines This list may not describe all possible interactions. Give your health care provider a list of all the medicines, herbs, non-prescription drugs, or dietary supplements you use. Also tell them if you smoke, drink alcohol, or use illegal drugs. Some items may interact with your   medicine. What should I watch for while using this medicine? Your condition will be monitored carefully while you are receiving this medicine. You may need blood work done while you are taking this medicine. This medicine can cause serious allergic reactions. To reduce your risk you may need to take medicine before treatment with this medicine. Take your medicine as directed. In some patients, this medicine may cause a serious brain infection that may cause death. If you have any problems seeing, thinking, speaking, walking, or standing, tell your healthcare professional right away. If you cannot reach your healthcare professional, urgently seek other  source of medical care. Call your doctor or health care professional for advice if you get a fever, chills or sore throat, or other symptoms of a cold or flu. Do not treat yourself. This drug decreases your body's ability to fight infections. Try to avoid being around people who are sick. Do not become pregnant while taking this medicine or for at least 12 months after stopping it. Women should inform their doctor if they wish to become pregnant or think they might be pregnant. There is a potential for serious side effects to an unborn child. Talk to your health care professional or pharmacist for more information. Do not breast-feed an infant while taking this medicine or for at least 6 months after stopping it. What side effects may I notice from receiving this medicine? Side effects that you should report to your doctor or health care professional as soon as possible:  allergic reactions like skin rash, itching or hives; swelling of the face, lips, or tongue  breathing problems  chest pain  changes in vision  diarrhea  headache with fever, neck stiffness, sensitivity to light, nausea, or confusion  fast, irregular heartbeat  loss of memory  low blood counts - this medicine may decrease the number of white blood cells, red blood cells and platelets. You may be at increased risk for infections and bleeding.  mouth sores  problems with balance, talking, or walking  redness, blistering, peeling or loosening of the skin, including inside the mouth  signs of infection - fever or chills, cough, sore throat, pain or difficulty passing urine  signs and symptoms of kidney injury like trouble passing urine or change in the amount of urine  signs and symptoms of liver injury like dark yellow or brown urine; general ill feeling or flu-like symptoms; light-colored stools; loss of appetite; nausea; right upper belly pain; unusually weak or tired; yellowing of the eyes or skin  signs and  symptoms of low blood pressure like dizziness; feeling faint or lightheaded, falls; unusually weak or tired  stomach pain  swelling of the ankles, feet, hands  unusual bleeding or bruising  vomiting Side effects that usually do not require medical attention (report to your doctor or health care professional if they continue or are bothersome):  headache  joint pain  muscle cramps or muscle pain  nausea  tiredness This list may not describe all possible side effects. Call your doctor for medical advice about side effects. You may report side effects to FDA at 1-800-FDA-1088. Where should I keep my medicine? This drug is given in a hospital or clinic and will not be stored at home. NOTE: This sheet is a summary. It may not cover all possible information. If you have questions about this medicine, talk to your doctor, pharmacist, or health care provider.  2020 Elsevier/Gold Standard (2018-05-17 22:01:36)  

## 2019-02-23 NOTE — Progress Notes (Signed)
Hematology and Oncology Follow Up Visit  Tiffany Leon [id number] [birth year] 68 y.o. 02/23/2019   Principle Diagnosis:  Waldenstrom's macroglobulinemia Anemia, chronic renal insufficiency, probable rheumatoid arthritis  Current Therapy:   Rituxan/Cytoxan-- start cycle #1 on 11/10/2018 -- cytoxan d/c'ed on 12/01/2018 Rituxan '375mg'$ /m2 IV q 2 months -- maintanence Retacrit 40,000 units sq for Hgb < 11   Interim History:  [name] is here today for follow-up.  She did go see the rheumatologist.  Looks like she does have rheumatoid arthritis but it does not look like she needs any additional therapy for this.  I spoke with her rheumatologist.  We agreed that the Rituxan that she is taking is probably all that she needs for right now.  We actually did do a bone marrow biopsy on her.  This was done on 01/23/2019.  The bone marrow biopsy report ([id number]) showed a normocellular marrow.  There are a few lymphoid aggregates.  There is no monoclonal B-cell population.  No evidence of lymphoplasmacytic lymphoma.  In her blood, there is no evidence of a monoclonal spike.  Her IgM level is down to 283 mg/dL.  She does look better.  She walked in.  When we first saw her, she was in a wheelchair.  She is getting the Retacrit.  This is helping her hemoglobin.  Her hemoglobin today is 10.5.  She has had no bleeding.  Her appetite is improving.  Overall, her performance status is getting a little bit better.  I am just happy that her quality life is improving.  I would say her performance status right now is ECOG 1.  Medications:  Allergies as of 02/23/2019      Reactions   Codeine Nausea And Vomiting      Medication List       Accurate as of February 23, 2019  1:01 PM. If you have any questions, ask your nurse or doctor.        amLODipine 2.5 MG tablet Commonly known as: NORVASC Take 1 tablet (2.5 mg total) by mouth daily.   aspirin 81 MG chewable tablet Chew 1 tablet (81 mg total) by  mouth daily.   atorvastatin 40 MG tablet Commonly known as: LIPITOR Take 1 tablet (40 mg total) by mouth daily at 6 PM.   carvedilol 25 MG tablet Commonly known as: COREG Take 1 tablet (25 mg total) by mouth 2 (two) times daily with a meal.   clonazePAM 0.5 MG tablet Commonly known as: KLONOPIN TAKE ONE TABLET BY MOUTH TWICE DAILY   clotrimazole 1 % cream Commonly known as: LOTRIMIN Apply topically 2 (two) times daily.   famotidine 40 MG tablet Commonly known as: PEPCID Take 1 tablet (40 mg total) by mouth 2 (two) times daily.   fluconazole 100 MG tablet Commonly known as: DIFLUCAN TAKE 1 TABLET BY MOUTH EVERY DAY   folic acid 1 MG tablet Commonly known as: FOLVITE Take 1 tablet (1 mg total) by mouth daily.   furosemide 40 MG tablet Commonly known as: LASIX Take 0.5 tablets (20 mg total) by mouth daily. Take 1/2 tablet (20 mg) by mouth in the morning and 1/2 tablet at lunch time.   hydrALAZINE 50 MG tablet Commonly known as: APRESOLINE Take 50 mg by mouth 3 (three) times daily. What changed: Another medication with the same name was removed. Continue taking this medication, and follow the directions you see here. Changed by: Rhetta Mura, CMA   isosorbide mononitrate 30 MG 24 hr tablet Commonly known  as: IMDUR Take 1 tablet (30 mg total) by mouth daily.   levothyroxine 75 MCG tablet Commonly known as: SYNTHROID Take 75 mcg by mouth daily.   Myrbetriq 25 MG Tb24 tablet Generic drug: mirabegron ER Take 25 mg by mouth daily.   PARoxetine 40 MG tablet Commonly known as: PAXIL Take 60 mg by mouth every morning.   potassium chloride 10 MEQ tablet Commonly known as: KLOR-CON Take 1 tablet (10 mEq total) by mouth daily.   predniSONE 10 MG tablet Commonly known as: DELTASONE TAKE 1 TABLET (10 MG TOTAL) BY MOUTH DAILY WITH BREAKFAST.   prochlorperazine 10 MG tablet Commonly known as: COMPAZINE Take 1 tablet (10 mg total) by mouth every 6 (six) hours as needed  (Nausea or vomiting).   traZODone 50 MG tablet Commonly known as: DESYREL Take 1-2 tablets (50-100 mg total) by mouth at bedtime.       Allergies:  Allergies  Allergen Reactions  . Codeine Nausea And Vomiting    Past Medical History, Surgical history, Social history, and Family History were reviewed and updated.  Review of Systems: Review of Systems  Constitutional: Negative.   HENT: Negative.   Eyes: Negative.   Respiratory: Negative.   Cardiovascular: Negative.   Gastrointestinal: Negative.   Genitourinary: Negative.   Musculoskeletal: Positive for joint pain.  Skin: Negative.   Neurological: Negative.   Endo/Heme/Allergies: Negative.   Psychiatric/Behavioral: Negative.      Physical Exam:  weight is 113 lb (51.3 kg). Her temporal temperature is 97.5 F (36.4 C) (abnormal). Her blood pressure is 136/63 and her pulse is 73. Her respiration is 17 and oxygen saturation is 100%.   Wt Readings from Last 3 Encounters:  02/23/19 113 lb (51.3 kg)  02/01/19 115 lb (52.2 kg)  01/29/19 115 lb 6.4 oz (52.3 kg)    Physical Exam Vitals signs reviewed.  HENT:     Head: Normocephalic and atraumatic.  Eyes:     Pupils: Pupils are equal, round, and reactive to light.  Neck:     Musculoskeletal: Normal range of motion.  Cardiovascular:     Rate and Rhythm: Normal rate and regular rhythm.     Heart sounds: Normal heart sounds.  Pulmonary:     Effort: Pulmonary effort is normal.     Breath sounds: Normal breath sounds.  Abdominal:     General: Bowel sounds are normal.     Palpations: Abdomen is soft.  Musculoskeletal: Normal range of motion.        General: No tenderness or deformity.  Lymphadenopathy:     Cervical: No cervical adenopathy.  Skin:    General: Skin is warm and dry.     Findings: No erythema or rash.  Neurological:     Mental Status: She is alert and oriented to person, place, and time.  Psychiatric:        Behavior: Behavior normal.        Thought  Content: Thought content normal.        Judgment: Judgment normal.      Lab Results  Component Value Date   WBC 7.4 02/23/2019   HGB 10.3 (L) 02/23/2019   HCT 30.4 (L) 02/23/2019   MCV 100.0 02/23/2019   PLT 254 02/23/2019   Lab Results  Component Value Date   FERRITIN 743 (H) 01/12/2019   IRON 149 (H) 01/12/2019   TIBC 244 01/12/2019   UIBC 95 (L) 01/12/2019   IRONPCTSAT 61 (H) 01/12/2019   Lab Results  Component Value  Date   RETICCTPCT 2.2 01/12/2019   RBC 3.04 (L) 02/23/2019   Lab Results  Component Value Date   KPAFRELGTCHN 67.3 (H) 12/22/2018   LAMBDASER 17.0 12/22/2018   KAPLAMBRATIO 3.96 (H) 12/22/2018   Lab Results  Component Value Date   IGGSERUM 32 (L) 12/22/2018   IGA 54 (L) 12/22/2018   IGMSERUM 283 (H) 12/22/2018   Lab Results  Component Value Date   TOTALPROTELP 5.0 (L) 12/22/2018   ALBUMINELP 3.2 12/22/2018   A1GS 0.3 12/22/2018   A2GS 0.8 12/22/2018   BETS 0.6 (L) 12/22/2018   GAMS 0.2 (L) 12/22/2018   MSPIKE Not Observed 12/22/2018   SPEI Comment 09/23/2018     Chemistry      Component Value Date/Time   NA 138 02/23/2019 1200   K 3.6 02/23/2019 1200   CL 98 02/23/2019 1200   CO2 29 02/23/2019 1200   BUN 33 (H) 02/23/2019 1200   CREATININE 1.39 (H) 02/23/2019 1200      Component Value Date/Time   CALCIUM 10.2 02/23/2019 1200   ALKPHOS 55 02/23/2019 1200   AST 21 02/23/2019 1200   ALT 23 02/23/2019 1200   BILITOT 0.4 02/23/2019 1200       Impression and Plan: Ms. Blizzard is a very pleasant 68 yo caucasian female with Waldenstrm's macroglobulinemia.  She has responded very well to Rituxan.  We initially had her on Rituxan with Cytoxan but she cannot tolerate the Cytoxan with her blood counts going down.  I will now plan for just maintenance Rituxan.  I think maintenance Rituxan every 2 months would be reasonable for right now.  She will get Retacrit today.  We will plan to get her through the holidays.  I have her come back in  early January for her next dose of the Rituxan.  Hopefully, the rheumatoid arthritis is not can be much of a problem for her.   [name], MD 11/6/20201:01 PM

## 2019-02-24 LAB — IGG, IGA, IGM
IgA: 66 mg/dL — ABNORMAL LOW (ref 87–352)
IgG (Immunoglobin G), Serum: 127 mg/dL — ABNORMAL LOW (ref 586–1602)
IgM (Immunoglobulin M), Srm: 135 mg/dL (ref 26–217)

## 2019-02-26 LAB — PROTEIN ELECTROPHORESIS, SERUM, WITH REFLEX
A/G Ratio: 1.9 — ABNORMAL HIGH (ref 0.7–1.7)
Albumin ELP: 3.9 g/dL (ref 2.9–4.4)
Alpha-1-Globulin: 0.3 g/dL (ref 0.0–0.4)
Alpha-2-Globulin: 1 g/dL (ref 0.4–1.0)
Beta Globulin: 0.7 g/dL (ref 0.7–1.3)
Gamma Globulin: 0.1 g/dL — ABNORMAL LOW (ref 0.4–1.8)
Globulin, Total: 2.1 g/dL — ABNORMAL LOW (ref 2.2–3.9)
Total Protein ELP: 6 g/dL (ref 6.0–8.5)

## 2019-02-26 LAB — KAPPA/LAMBDA LIGHT CHAINS
Kappa free light chain: 37.5 mg/L — ABNORMAL HIGH (ref 3.3–19.4)
Kappa, lambda light chain ratio: 2.64 — ABNORMAL HIGH (ref 0.26–1.65)
Lambda free light chains: 14.2 mg/L (ref 5.7–26.3)

## 2019-03-01 ENCOUNTER — Telehealth (INDEPENDENT_AMBULATORY_CARE_PROVIDER_SITE_OTHER): Payer: Medicare Other | Admitting: Internal Medicine

## 2019-03-01 ENCOUNTER — Encounter: Payer: Self-pay | Admitting: Internal Medicine

## 2019-03-01 ENCOUNTER — Other Ambulatory Visit: Payer: Self-pay | Admitting: Psychiatry

## 2019-03-01 VITALS — BP 100/60 | Wt 113.0 lb

## 2019-03-01 DIAGNOSIS — E785 Hyperlipidemia, unspecified: Secondary | ICD-10-CM

## 2019-03-01 DIAGNOSIS — I5022 Chronic systolic (congestive) heart failure: Secondary | ICD-10-CM | POA: Diagnosis not present

## 2019-03-01 DIAGNOSIS — N1831 Chronic kidney disease, stage 3a: Secondary | ICD-10-CM | POA: Diagnosis not present

## 2019-03-01 NOTE — Progress Notes (Signed)
Virtual Visit via Telephone Note   This visit type was conducted due to national recommendations for restrictions regarding the COVID-19 Pandemic (e.g. social distancing) in an effort to limit this patient's exposure and mitigate transmission in our community.  Due to her co-morbid illnesses, this patient is at least at moderate risk for complications without adequate follow up.  This format is felt to be most appropriate for this patient at this time.  The patient did not have access to video technology/had technical difficulties with video requiring transitioning to audio format only (telephone).  All issues noted in this document were discussed and addressed.  No physical exam could be performed with this format.  Please refer to the patient's chart for her  consent to telehealth for [facility].   Evaluation Performed:  Telephone follow-up  Date:  03/01/2019   ID:  Tiffany Leon, DOB 1950/05/27, MRN [id number]  Patient Location:  [place] [address]  Provider location:   [address] [place], [place] [address]  PCP:  Aretta Nip, MD  Cardiologist:  Pixie Casino, MD Electrophysiologist:  None   Chief Complaint:  Dizziness has improved  History of Present Illness:    Tiffany Leon is a 68 y.o. female who presents via audio/video conferencing for a telehealth visit today.  Tiffany Leon was seen today in follow-up.  She had an echocardiogram which showed an LVEF of 45 to 50%.  A nuclear stress test was performed which showed no reversible ischemia and LVEF of 50%.  Recently she has had some improvement of her renal function with a GFR now up to about 60.  It was in the mid 66s.  She is currently on carvedilol and hydralazine as well as some low-dose amlodipine.  For some reason her blood pressure has been running lower and she has been dizzy.  I decreased her hydralazine from 100 3 times daily down to 50 3 times daily.  She says this is improved  somewhat however her blood pressures typically around 100/60 now.  She denies any chest pain or shortness of breath.  The patient does not have symptoms concerning for COVID-19 infection (fever, chills, cough, or new SHORTNESS OF BREATH).    Prior CV studies:   The following studies were reviewed today:  Chart reviewed  PMHx:  Past Medical History:  Diagnosis Date   Acute kidney injury ([place])    a. 09/2018   Anxiety    Aortic atherosclerosis ([place])    a. 09/2018 noted on Chest CT.   Chronic combined systolic (congestive) and diastolic (congestive) heart failure ([place])    a. 09/2018 Echo: EF 45-50%, diff HK. Triv to small circumfirential pericardial eff.   Chronic DOE (dyspnea on exertion)    Claudication ([place])    a. Bilat hip claudication since ~ 2019.   Depression    Dyspnea    Emphysema lung ([place])    Exertional angina ([place])    a. Ex angina since ~ 2019.   GERD (gastroesophageal reflux disease)    Goals of care, counseling/discussion 11/03/2018   Hiatal hernia    History of bronchitis    History of kidney stones    Hypothyroidism    Pleural effusion    a. 09/2018 s/p thoracentesis-->920m.   Pleural effusion 09/2018   Rash 10/2018   Rash began Saturday with unknown reason.  MD seen Monday 10/23/18   Resting tremor    Tobacco abuse    [place] 11/03/2018  Past Surgical History:  Procedure Laterality Date   BREAST LUMPECTOMY WITH RADIOACTIVE SEED LOCALIZATION Left 12/28/2017   Procedure: BREAST LUMPECTOMY WITH RADIOACTIVE SEED LOCALIZATION;  Surgeon: Jovita Kussmaul, MD;  Location: [facility];  Service: General;  Laterality: Left;   DG THUMB RIGHT HAND ([facility] HX)     IR IMAGING GUIDED PORT INSERTION  11/09/2018   IR THORACENTESIS ASP PLEURAL SPACE W/IMG GUIDE  09/20/2018   KIDNEY STONE SURGERY      FAMHx:  Family History  Problem Relation Age of Onset   Stroke Mother        Mini-strokes. Died @ 62.   Dementia Mother     Lung cancer Father        Died in his 42's   Addison's disease Sister    Hypertension Brother    CAD Brother    Hypertension Brother     SOCHx:   reports that she has been smoking cigarettes. She has a 10.00 pack-year smoking history. She has never used smokeless tobacco. She reports previous alcohol use. She reports previous drug use.  ALLERGIES:  Allergies  Allergen Reactions   Codeine Nausea And Vomiting    MEDS:  Current Meds  Medication Sig   aspirin 81 MG chewable tablet Chew 1 tablet (81 mg total) by mouth daily.   atorvastatin (LIPITOR) 40 MG tablet Take 1 tablet (40 mg total) by mouth daily at 6 PM.   carvedilol (COREG) 25 MG tablet Take 1 tablet (25 mg total) by mouth 2 (two) times daily with a meal.   clonazePAM (KLONOPIN) 0.5 MG tablet TAKE ONE TABLET BY MOUTH TWICE DAILY   clotrimazole (LOTRIMIN) 1 % cream Apply topically 2 (two) times daily.   famotidine (PEPCID) 40 MG tablet Take 1 tablet (40 mg total) by mouth 2 (two) times daily.   fluconazole (DIFLUCAN) 100 MG tablet TAKE 1 TABLET BY MOUTH EVERY DAY   folic acid (FOLVITE) 1 MG tablet Take 1 tablet (1 mg total) by mouth daily.   furosemide (LASIX) 40 MG tablet Take 0.5 tablets (20 mg total) by mouth daily. Take 1/2 tablet (20 mg) by mouth in the morning and 1/2 tablet at lunch time.   hydrALAZINE (APRESOLINE) 50 MG tablet Take 50 mg by mouth 3 (three) times daily.   isosorbide mononitrate (IMDUR) 30 MG 24 hr tablet Take 1 tablet (30 mg total) by mouth daily.   levothyroxine (SYNTHROID) 75 MCG tablet Take 75 mcg by mouth daily.   MYRBETRIQ 25 MG TB24 tablet Take 25 mg by mouth daily.   PARoxetine (PAXIL) 40 MG tablet Take 60 mg by mouth every morning.   potassium chloride (K-DUR) 10 MEQ tablet Take 1 tablet (10 mEq total) by mouth daily.   predniSONE (DELTASONE) 10 MG tablet TAKE 1 TABLET (10 MG TOTAL) BY MOUTH DAILY WITH BREAKFAST.   prochlorperazine (COMPAZINE) 10 MG tablet Take 1 tablet  (10 mg total) by mouth every 6 (six) hours as needed (Nausea or vomiting).   traZODone (DESYREL) 50 MG tablet Take 1-2 tablets (50-100 mg total) by mouth at bedtime.     ROS: Pertinent items noted in HPI and remainder of comprehensive ROS otherwise negative.  Labs/Other Tests and Data Reviewed:    Recent Labs: 09/20/2018: TSH 5.145 09/28/2018: B Natriuretic Peptide 224.7 02/23/2019: ALT 23; BUN 33; Creatinine 1.39; Hemoglobin 10.3; Platelet Count 254; Potassium 3.6; Sodium 138   Recent Lipid Panel Lab Results  Component Value Date/Time   CHOL 121 09/28/2018 03:28 AM   TRIG  102 09/28/2018 03:28 AM   HDL 29 (L) 09/28/2018 03:28 AM   CHOLHDL 4.2 09/28/2018 03:28 AM   LDLCALC 72 09/28/2018 03:28 AM    Wt Readings from Last 3 Encounters:  03/01/19 113 lb (51.3 kg)  02/23/19 113 lb (51.3 kg)  02/01/19 115 lb (52.2 kg)     Exam:    Vital Signs:  BP 100/60    Wt 113 lb (51.3 kg)    BMI 18.24 kg/m    Exam not performed due to telephone visit  ASSESSMENT & PLAN:    1. Acute on chronic systolic congestive heart failure-LVEF 45% 2. Multivessel coronary artery calcification - negative myoview stress test for ischemia (01/2019) 3. Elevated ESR-possible rheumatoid arthritis versus malignancy 4. Recent acute kidney injury - CKD3a  Tiffany Leon is feeling somewhat better.  Her LVEF is about 45 to 50% however nuclear stress test was negative for ischemia despite multivessel coronary calcification.  Her creatinine has improved somewhat with a GFR now about to 60.  Blood pressure is running low and she has had some dizziness.  I backed off on hydralazine and I will stop her amlodipine.  Ultimately if her renal function further improves, I would recommend substituting an ARB for her hydralazine.  Plan follow-up with me in 6 months or sooner as necessary.  COVID-19 Education: The signs and symptoms of COVID-19 were discussed with the patient and how to seek care for testing (follow up with PCP or  arrange E-visit).  The importance of social distancing was discussed today.  Patient Risk:   After full review of this patients clinical status, I feel that they are at least moderate risk at this time.  Time:   Today, I have spent 15 minutes with the patient with telehealth technology discussing hypertension, CKD, NICM, systolic CHF.     Medication Adjustments/Labs and Tests Ordered: Current medicines are reviewed at length with the patient today.  Concerns regarding medicines are outlined above.   Tests Ordered: No orders of the defined types were placed in this encounter.   Medication Changes: No orders of the defined types were placed in this encounter.   Disposition:  in 6 month(s)  Pixie Casino, MD, [facility], [place] Director of the Advanced Lipid Disorders &  Cardiovascular Risk Reduction Clinic Diplomate of the American Board of Clinical Lipidology Attending Cardiologist  Direct Dial: [phone]   Fax: [phone]  Website:  [email or web address]  Pixie Casino, MD  03/01/2019 8:40 AM

## 2019-03-01 NOTE — Patient Instructions (Signed)
Medication Instructions:  STOP amlodipine Continue other current medications  *If you need a refill on your cardiac medications before your next appointment, please call your pharmacy*  Follow-Up: At [facility], you and your health needs are our priority.  As part of our continuing mission to provide you with exceptional heart care, we have created designated Provider Care Teams.  These Care Teams include your primary Cardiologist (physician) and Advanced Practice Providers (APPs -  Physician Assistants and Nurse Practitioners) who all work together to provide you with the care you need, when you need it.  Your next appointment:   6 months  The format for your next appointment:   In Person  Provider:   You may see Pixie Casino, MD or one of the following Advanced Practice Providers on your designated Care Team:    Almyra Deforest, PA-C  Fabian Sharp, PA-C or   Roby Lofts, [place]   Other Instructions

## 2019-03-09 ENCOUNTER — Other Ambulatory Visit: Payer: Self-pay | Admitting: Hematology & Oncology

## 2019-03-21 ENCOUNTER — Other Ambulatory Visit: Payer: Self-pay

## 2019-03-21 MED ORDER — HYDRALAZINE HCL 50 MG PO TABS: 50.0000 mg | ORAL_TABLET | Freq: Three times a day (TID) | ORAL | 1 refills | Status: DC

## 2019-03-26 ENCOUNTER — Other Ambulatory Visit: Payer: Self-pay | Admitting: Hematology & Oncology

## 2019-04-04 ENCOUNTER — Other Ambulatory Visit: Payer: Self-pay | Admitting: Hematology & Oncology

## 2019-04-27 ENCOUNTER — Other Ambulatory Visit: Payer: Self-pay

## 2019-04-27 ENCOUNTER — Inpatient Hospital Stay: Payer: Medicare Other

## 2019-04-27 ENCOUNTER — Inpatient Hospital Stay: Payer: Medicare Other | Attending: Hematology & Oncology

## 2019-04-27 ENCOUNTER — Encounter: Payer: Self-pay | Admitting: Hematology & Oncology

## 2019-04-27 ENCOUNTER — Inpatient Hospital Stay (HOSPITAL_BASED_OUTPATIENT_CLINIC_OR_DEPARTMENT_OTHER): Payer: Medicare Other | Admitting: Hematology & Oncology

## 2019-04-27 VITALS — Wt 115.0 lb

## 2019-04-27 VITALS — BP 114/46 | HR 70 | Temp 97.1°F | Resp 16

## 2019-04-27 DIAGNOSIS — C880 Waldenstrom macroglobulinemia: Secondary | ICD-10-CM

## 2019-04-27 DIAGNOSIS — Z79899 Other long term (current) drug therapy: Secondary | ICD-10-CM | POA: Diagnosis not present

## 2019-04-27 DIAGNOSIS — D631 Anemia in chronic kidney disease: Secondary | ICD-10-CM

## 2019-04-27 DIAGNOSIS — Z5112 Encounter for antineoplastic immunotherapy: Secondary | ICD-10-CM | POA: Diagnosis present

## 2019-04-27 DIAGNOSIS — N183 Chronic kidney disease, stage 3 unspecified: Secondary | ICD-10-CM

## 2019-04-27 DIAGNOSIS — N189 Chronic kidney disease, unspecified: Secondary | ICD-10-CM | POA: Insufficient documentation

## 2019-04-27 DIAGNOSIS — N1831 Chronic kidney disease, stage 3a: Secondary | ICD-10-CM

## 2019-04-27 LAB — CBC WITH DIFFERENTIAL (CANCER CENTER ONLY)
Abs Immature Granulocytes: 0.2 10*3/uL — ABNORMAL HIGH (ref 0.00–0.07)
Basophils Absolute: 0 10*3/uL (ref 0.0–0.1)
Basophils Relative: 1 %
Eosinophils Absolute: 0.1 10*3/uL (ref 0.0–0.5)
Eosinophils Relative: 2 %
HCT: 34.2 % — ABNORMAL LOW (ref 36.0–46.0)
Hemoglobin: 11.4 g/dL — ABNORMAL LOW (ref 12.0–15.0)
Immature Granulocytes: 3 %
Lymphocytes Relative: 20 %
Lymphs Abs: 1.2 10*3/uL (ref 0.7–4.0)
MCH: 33.5 pg (ref 26.0–34.0)
MCHC: 33.3 g/dL (ref 30.0–36.0)
MCV: 100.6 fL — ABNORMAL HIGH (ref 80.0–100.0)
Monocytes Absolute: 0.3 10*3/uL (ref 0.1–1.0)
Monocytes Relative: 6 %
Neutro Abs: 4.1 10*3/uL (ref 1.7–7.7)
Neutrophils Relative %: 68 %
Platelet Count: 240 10*3/uL (ref 150–400)
RBC: 3.4 MIL/uL — ABNORMAL LOW (ref 3.87–5.11)
RDW: 13.6 % (ref 11.5–15.5)
WBC Count: 6 10*3/uL (ref 4.0–10.5)
nRBC: 0 % (ref 0.0–0.2)

## 2019-04-27 LAB — CMP (CANCER CENTER ONLY)
ALT: 37 U/L (ref 0–44)
AST: 23 U/L (ref 15–41)
Albumin: 4.1 g/dL (ref 3.5–5.0)
Alkaline Phosphatase: 52 U/L (ref 38–126)
Anion gap: 7 (ref 5–15)
BUN: 24 mg/dL — ABNORMAL HIGH (ref 8–23)
CO2: 28 mmol/L (ref 22–32)
Calcium: 9.5 mg/dL (ref 8.9–10.3)
Chloride: 106 mmol/L (ref 98–111)
Creatinine: 1.03 mg/dL — ABNORMAL HIGH (ref 0.44–1.00)
GFR, Est AFR Am: >60 mL/min (ref >60)
GFR, Estimated: 55 mL/min — ABNORMAL LOW (ref >60)
Glucose, Bld: 103 mg/dL — ABNORMAL HIGH (ref 70–99)
Potassium: 4.1 mmol/L (ref 3.5–5.1)
Sodium: 141 mmol/L (ref 135–145)
Total Bilirubin: 0.3 mg/dL (ref 0.3–1.2)
Total Protein: 5.6 g/dL — ABNORMAL LOW (ref 6.5–8.1)

## 2019-04-27 LAB — IRON AND TIBC
Iron: 94 ug/dL (ref 41–142)
Saturation Ratios: 37 % (ref 21–57)
TIBC: 258 ug/dL (ref 236–444)
UIBC: 163 ug/dL (ref 120–384)

## 2019-04-27 LAB — RETICULOCYTES
Immature Retic Fract: 7.7 % (ref 2.3–15.9)
RBC.: 3.4 MIL/uL — ABNORMAL LOW (ref 3.87–5.11)
Retic Count, Absolute: 67 10*3/uL (ref 19.0–186.0)
Retic Ct Pct: 2 % (ref 0.4–3.1)

## 2019-04-27 LAB — FERRITIN: Ferritin: 400 ng/mL — ABNORMAL HIGH (ref 11–307)

## 2019-04-27 MED ORDER — SODIUM CHLORIDE 0.9% FLUSH
10.0000 mL | INTRAVENOUS | Status: DC | PRN
  Administered 2019-04-27: 10 mL
  Filled 2019-04-27: qty 10

## 2019-04-27 MED ORDER — DIPHENHYDRAMINE HCL 25 MG PO CAPS
ORAL_CAPSULE | ORAL | Status: AC
  Filled 2019-04-27: qty 2

## 2019-04-27 MED ORDER — ACETAMINOPHEN 325 MG PO TABS
ORAL_TABLET | ORAL | Status: AC
  Filled 2019-04-27: qty 2

## 2019-04-27 MED ORDER — HEPARIN SOD (PORK) LOCK FLUSH 100 UNIT/ML IV SOLN
500.0000 [IU] | Freq: Once | INTRAVENOUS | Status: AC | PRN
  Administered 2019-04-27: 500 [IU]
  Filled 2019-04-27: qty 5

## 2019-04-27 MED ORDER — DIPHENHYDRAMINE HCL 25 MG PO CAPS
50.0000 mg | ORAL_CAPSULE | Freq: Once | ORAL | Status: AC
  Administered 2019-04-27: 50 mg via ORAL

## 2019-04-27 MED ORDER — SODIUM CHLORIDE 0.9 % IV SOLN
Freq: Once | INTRAVENOUS | Status: AC
  Filled 2019-04-27: qty 250

## 2019-04-27 MED ORDER — ACETAMINOPHEN 325 MG PO TABS
650.0000 mg | ORAL_TABLET | Freq: Once | ORAL | Status: AC
  Administered 2019-04-27: 650 mg via ORAL

## 2019-04-27 MED ORDER — SODIUM CHLORIDE 0.9 % IV SOLN
375.0000 mg/m2 | Freq: Once | INTRAVENOUS | Status: AC
  Administered 2019-04-27: 600 mg via INTRAVENOUS
  Filled 2019-04-27: qty 50

## 2019-04-27 NOTE — Progress Notes (Signed)
Hematology and Oncology Follow Up Visit  Tiffany Leon [id number] Dec 16, 1950 69 y.o. 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 is here today for follow-up.  She keeps improving.  She really looks good.  She had a very nice holiday season.  She has not seen the rheumatologist for a little bit.  She does have rheumatoid arthritis.  We do have her on Rituxan for the Waldenstrom'swhich also will help with the rheumatoid arthritis.  When she was last here, her M spike was not observed.  Her IgM level was 135 mg/dL.  Her hemoglobin is doing quite well.  There is no problems with respect to iron deficiency from my point of view.  She is having no problems with fever.  She has had no rashes.  There is been no leg swelling.  She is eating well.  Her weight is slowly going up.  Overall, her performance status is ECOG 1.   Medications:  Allergies as of 04/27/2019      Reactions   Codeine Nausea And Vomiting      Medication List       Accurate as of April 27, 2019  9:44 AM. If you have any questions, ask your nurse or doctor.        aspirin 81 MG chewable tablet Chew 1 tablet (81 mg total) by mouth daily.   atorvastatin 40 MG tablet Commonly known as: LIPITOR Take 1 tablet (40 mg total) by mouth daily at 6 PM.   carvedilol 25 MG tablet Commonly known as: COREG Take 1 tablet (25 mg total) by mouth 2 (two) times daily with a meal.   clonazePAM 0.5 MG tablet Commonly known as: KLONOPIN TAKE ONE TABLET BY MOUTH TWICE DAILY   clotrimazole 1 % cream Commonly known as: LOTRIMIN Apply topically 2 (two) times daily.   famotidine 40 MG tablet Commonly known as: PEPCID Take 1  tablet (40 mg total) by mouth 2 (two) times daily.   fluconazole 100 MG tablet Commonly known as: DIFLUCAN TAKE 1 TABLET BY MOUTH EVERY DAY   folic acid 1 MG tablet Commonly known as: FOLVITE Take 1 tablet (1 mg total) by mouth daily.   furosemide 40 MG tablet Commonly known as: LASIX Take 0.5 tablets (20 mg total) by mouth daily. Take 1/2 tablet (20 mg) by mouth in the morning and 1/2 tablet at lunch time.   hydrALAZINE 50 MG tablet Commonly known as: APRESOLINE Take 1 tablet (50 mg total) by mouth 3 (three) times daily.   isosorbide mononitrate 30 MG 24 hr tablet Commonly known as: IMDUR Take 1 tablet (30 mg total) by mouth daily.   levothyroxine 75 MCG tablet Commonly known as: SYNTHROID Take 75 mcg by mouth daily.   Myrbetriq 25 MG Tb24 tablet Generic drug: mirabegron ER TAKE 1 TABLET BY MOUTH EVERY DAY   PARoxetine 40 MG tablet Commonly known as: PAXIL TAKE 1.5 TABLETS BY MOUTH DAILY AT BEDTIME   potassium chloride 10 MEQ tablet Commonly known as: KLOR-CON Take 1 tablet (10 mEq total) by mouth daily.   predniSONE 10 MG tablet Commonly known as: DELTASONE TAKE 1 TABLET (10 MG TOTAL) BY MOUTH DAILY WITH BREAKFAST.   prochlorperazine 10 MG tablet Commonly known as: COMPAZINE  Take 1 tablet (10 mg total) by mouth every 6 (six) hours as needed (Nausea or vomiting).   traZODone 50 MG tablet Commonly known as: DESYREL Take 1-2 tablets (50-100 mg total) by mouth at bedtime.       Allergies:  Allergies  Allergen Reactions  . Codeine Nausea And Vomiting    Past Medical History, Surgical history, Social history, and Family History were reviewed and updated.  Review of Systems: Review of Systems  Constitutional: Negative.   HENT: Negative.   Eyes: Negative.   Respiratory: Negative.   Cardiovascular: Negative.   Gastrointestinal: Negative.   Genitourinary: Negative.   Musculoskeletal: Positive for joint pain.  Skin: Negative.   Neurological: Negative.     Endo/Heme/Allergies: Negative.   Psychiatric/Behavioral: Negative.      Physical Exam:  weight is 115 lb (52.2 kg).   Wt Readings from Last 3 Encounters:  04/27/19 115 lb (52.2 kg)  03/01/19 113 lb (51.3 kg)  02/23/19 113 lb (51.3 kg)    Physical Exam Vitals reviewed.  HENT:     Head: Normocephalic and atraumatic.  Eyes:     Pupils: Pupils are equal, round, and reactive to light.  Cardiovascular:     Rate and Rhythm: Normal rate and regular rhythm.     Heart sounds: Normal heart sounds.  Pulmonary:     Effort: Pulmonary effort is normal.     Breath sounds: Normal breath sounds.  Abdominal:     General: Bowel sounds are normal.     Palpations: Abdomen is soft.  Musculoskeletal:        General: No tenderness or deformity. Normal range of motion.     Cervical back: Normal range of motion.  Lymphadenopathy:     Cervical: No cervical adenopathy.  Skin:    General: Skin is warm and dry.     Findings: No erythema or rash.  Neurological:     Mental Status: She is alert and oriented to person, place, and time.  Psychiatric:        Behavior: Behavior normal.        Thought Content: Thought content normal.        Judgment: Judgment normal.      Lab Results  Component Value Date   WBC 6.0 04/27/2019   HGB 11.4 (L) 04/27/2019   HCT 34.2 (L) 04/27/2019   MCV 100.6 (H) 04/27/2019   PLT 240 04/27/2019   Lab Results  Component Value Date   FERRITIN 743 (H) 01/12/2019   IRON 149 (H) 01/12/2019   TIBC 244 01/12/2019   UIBC 95 (L) 01/12/2019   IRONPCTSAT 61 (H) 01/12/2019   Lab Results  Component Value Date   RETICCTPCT 2.0 04/27/2019   RBC 3.40 (L) 04/27/2019   RBC 3.40 (L) 04/27/2019   Lab Results  Component Value Date   KPAFRELGTCHN 37.5 (H) 02/23/2019   LAMBDASER 14.2 02/23/2019   KAPLAMBRATIO 2.64 (H) 02/23/2019   Lab Results  Component Value Date   IGGSERUM 127 (L) 02/23/2019   IGA 66 (L) 02/23/2019   IGMSERUM 135 02/23/2019   Lab Results   Component Value Date   TOTALPROTELP 6.0 02/23/2019   ALBUMINELP 3.9 02/23/2019   A1GS 0.3 02/23/2019   A2GS 1.0 02/23/2019   BETS 0.7 02/23/2019   GAMS 0.1 (L) 02/23/2019   MSPIKE Not Observed 02/23/2019   SPEI Comment 09/23/2018     Chemistry      Component Value Date/Time   NA 141 04/27/2019 0825   K 4.1 04/27/2019  0825   CL 106 04/27/2019 0825   CO2 28 04/27/2019 0825   BUN 24 (H) 04/27/2019 0825   CREATININE 1.03 (H) 04/27/2019 0825      Component Value Date/Time   CALCIUM 9.5 04/27/2019 0825   ALKPHOS 52 04/27/2019 0825   AST 23 04/27/2019 0825   ALT 37 04/27/2019 0825   BILITOT 0.3 04/27/2019 0825       Impression and Plan: Ms. Casal is a very pleasant 69 yo caucasian female with Waldenstrm's macroglobulinemia.  She has responded very well to Rituxan.  We initially had her on Rituxan with Cytoxan but she cannot tolerate the Cytoxan with her blood counts going down.  I will now plan for just maintenance Rituxan.  I think maintenance Rituxan every 2 months would be reasonable for right now.  We will plan to see her back in 2 more months.   [name], MD 1/8/20219:44 AM

## 2019-04-27 NOTE — Patient Instructions (Signed)

## 2019-04-27 NOTE — Patient Instructions (Signed)
Rituximab injection What is this medicine? RITUXIMAB (ri TUX i mab) is a monoclonal antibody. It is used to treat certain types of cancer like non-Hodgkin lymphoma and chronic lymphocytic leukemia. It is also used to treat rheumatoid arthritis, granulomatosis with polyangiitis (or Wegener's granulomatosis), microscopic polyangiitis, and pemphigus vulgaris. This medicine may be used for other purposes; ask your health care provider or pharmacist if you have questions. COMMON BRAND NAME(S): Rituxan, RUXIENCE What should I tell my health care provider before I take this medicine? They need to know if you have any of these conditions:  heart disease  infection (especially a virus infection such as hepatitis B, chickenpox, cold sores, or herpes)  immune system problems  irregular heartbeat  kidney disease  low blood counts, like low white cell, platelet, or red cell counts  lung or breathing disease, like asthma  recently received or scheduled to receive a vaccine  an unusual or allergic reaction to rituximab, other medicines, foods, dyes, or preservatives  pregnant or trying to get pregnant  breast-feeding How should I use this medicine? This medicine is for infusion into a vein. It is administered in a hospital or clinic by a specially trained health care professional. A special MedGuide will be given to you by the pharmacist with each prescription and refill. Be sure to read this information carefully each time. Talk to your pediatrician regarding the use of this medicine in children. This medicine is not approved for use in children. Overdosage: If you think you have taken too much of this medicine contact a poison control center or emergency room at once. NOTE: This medicine is only for you. Do not share this medicine with others. What if I miss a dose? It is important not to miss a dose. Call your doctor or health care professional if you are unable to keep an appointment. What  may interact with this medicine?  cisplatin  live virus vaccines This list may not describe all possible interactions. Give your health care provider a list of all the medicines, herbs, non-prescription drugs, or dietary supplements you use. Also tell them if you smoke, drink alcohol, or use illegal drugs. Some items may interact with your medicine. What should I watch for while using this medicine? Your condition will be monitored carefully while you are receiving this medicine. You may need blood work done while you are taking this medicine. This medicine can cause serious allergic reactions. To reduce your risk you may need to take medicine before treatment with this medicine. Take your medicine as directed. In some patients, this medicine may cause a serious brain infection that may cause death. If you have any problems seeing, thinking, speaking, walking, or standing, tell your healthcare professional right away. If you cannot reach your healthcare professional, urgently seek other source of medical care. Call your doctor or health care professional for advice if you get a fever, chills or sore throat, or other symptoms of a cold or flu. Do not treat yourself. This drug decreases your body's ability to fight infections. Try to avoid being around people who are sick. Do not become pregnant while taking this medicine or for at least 12 months after stopping it. Women should inform their doctor if they wish to become pregnant or think they might be pregnant. There is a potential for serious side effects to an unborn child. Talk to your health care professional or pharmacist for more information. Do not breast-feed an infant while taking this medicine or for at   least 6 months after stopping it. What side effects may I notice from receiving this medicine? Side effects that you should report to your doctor or health care professional as soon as possible:  allergic reactions like skin rash, itching or  hives; swelling of the face, lips, or tongue  breathing problems  chest pain  changes in vision  diarrhea  headache with fever, neck stiffness, sensitivity to light, nausea, or confusion  fast, irregular heartbeat  loss of memory  low blood counts - this medicine may decrease the number of white blood cells, red blood cells and platelets. You may be at increased risk for infections and bleeding.  mouth sores  problems with balance, talking, or walking  redness, blistering, peeling or loosening of the skin, including inside the mouth  signs of infection - fever or chills, cough, sore throat, pain or difficulty passing urine  signs and symptoms of kidney injury like trouble passing urine or change in the amount of urine  signs and symptoms of liver injury like dark yellow or brown urine; general ill feeling or flu-like symptoms; light-colored stools; loss of appetite; nausea; right upper belly pain; unusually weak or tired; yellowing of the eyes or skin  signs and symptoms of low blood pressure like dizziness; feeling faint or lightheaded, falls; unusually weak or tired  stomach pain  swelling of the ankles, feet, hands  unusual bleeding or bruising  vomiting Side effects that usually do not require medical attention (report to your doctor or health care professional if they continue or are bothersome):  headache  joint pain  muscle cramps or muscle pain  nausea  tiredness This list may not describe all possible side effects. Call your doctor for medical advice about side effects. You may report side effects to FDA at 1-800-FDA-1088. Where should I keep my medicine? This drug is given in a hospital or clinic and will not be stored at home. NOTE: This sheet is a summary. It may not cover all possible information. If you have questions about this medicine, talk to your doctor, pharmacist, or health care provider.  2020 [employer] Standard (2018-05-17  22:01:36)  

## 2019-04-28 LAB — IGG, IGA, IGM
IgA: 54 mg/dL — ABNORMAL LOW (ref 87–352)
IgG (Immunoglobin G), Serum: 163 mg/dL — ABNORMAL LOW (ref 586–1602)
IgM (Immunoglobulin M), Srm: 72 mg/dL (ref 26–217)

## 2019-04-30 ENCOUNTER — Telehealth: Payer: Self-pay | Admitting: *Deleted

## 2019-04-30 LAB — PROTEIN ELECTROPHORESIS, SERUM, WITH REFLEX
A/G Ratio: 2.2 — ABNORMAL HIGH (ref 0.7–1.7)
Albumin ELP: 3.8 g/dL (ref 2.9–4.4)
Alpha-1-Globulin: 0.2 g/dL (ref 0.0–0.4)
Alpha-2-Globulin: 0.8 g/dL (ref 0.4–1.0)
Beta Globulin: 0.6 g/dL — ABNORMAL LOW (ref 0.7–1.3)
Gamma Globulin: 0.2 g/dL — ABNORMAL LOW (ref 0.4–1.8)
Globulin, Total: 1.7 g/dL — ABNORMAL LOW (ref 2.2–3.9)
Total Protein ELP: 5.5 g/dL — ABNORMAL LOW (ref 6.0–8.5)

## 2019-04-30 LAB — KAPPA/LAMBDA LIGHT CHAINS
Kappa free light chain: 17.4 mg/L (ref 3.3–19.4)
Kappa, lambda light chain ratio: 1.43 (ref 0.26–1.65)
Lambda free light chains: 12.2 mg/L (ref 5.7–26.3)

## 2019-04-30 NOTE — Telephone Encounter (Signed)
-----   Message from [name], MD sent at 04/30/2019  4:11 PM EST ----- Call - the Waldenstrom's is still in remission!!!  Tiffany Leon

## 2019-04-30 NOTE — Telephone Encounter (Signed)
As noted below by Dr. Marin Olp, I informed the patient that the Waldenstrom's is still in remission. She verbalized understanding.

## 2019-05-02 ENCOUNTER — Encounter: Payer: Self-pay | Admitting: Hematology & Oncology

## 2019-05-18 ENCOUNTER — Encounter: Payer: Self-pay | Admitting: Hematology & Oncology

## 2019-06-19 ENCOUNTER — Ambulatory Visit: Payer: Medicare Other | Admitting: Psychiatry

## 2019-06-19 ENCOUNTER — Other Ambulatory Visit: Payer: Self-pay

## 2019-06-22 ENCOUNTER — Inpatient Hospital Stay: Payer: Medicare Other

## 2019-06-22 ENCOUNTER — Other Ambulatory Visit: Payer: Self-pay

## 2019-06-22 ENCOUNTER — Inpatient Hospital Stay: Payer: Medicare Other | Attending: Hematology & Oncology | Admitting: Hematology & Oncology

## 2019-06-22 ENCOUNTER — Encounter: Payer: Self-pay | Admitting: Hematology & Oncology

## 2019-06-22 VITALS — BP 120/77 | HR 65 | Resp 17

## 2019-06-22 VITALS — BP 119/58 | HR 66 | Temp 96.8°F | Resp 20 | Wt 114.0 lb

## 2019-06-22 DIAGNOSIS — C880 Waldenstrom macroglobulinemia: Secondary | ICD-10-CM

## 2019-06-22 DIAGNOSIS — Z79899 Other long term (current) drug therapy: Secondary | ICD-10-CM | POA: Insufficient documentation

## 2019-06-22 DIAGNOSIS — Z95828 Presence of other vascular implants and grafts: Secondary | ICD-10-CM

## 2019-06-22 DIAGNOSIS — E611 Iron deficiency: Secondary | ICD-10-CM | POA: Diagnosis not present

## 2019-06-22 DIAGNOSIS — D631 Anemia in chronic kidney disease: Secondary | ICD-10-CM | POA: Insufficient documentation

## 2019-06-22 DIAGNOSIS — N189 Chronic kidney disease, unspecified: Secondary | ICD-10-CM | POA: Insufficient documentation

## 2019-06-22 DIAGNOSIS — Z5112 Encounter for antineoplastic immunotherapy: Secondary | ICD-10-CM | POA: Diagnosis not present

## 2019-06-22 DIAGNOSIS — N183 Chronic kidney disease, stage 3 unspecified: Secondary | ICD-10-CM

## 2019-06-22 LAB — CMP (CANCER CENTER ONLY)
ALT: 21 U/L (ref 0–44)
AST: 18 U/L (ref 15–41)
Albumin: 4.3 g/dL (ref 3.5–5.0)
Alkaline Phosphatase: 58 U/L (ref 38–126)
Anion gap: 8 (ref 5–15)
BUN: 18 mg/dL (ref 8–23)
CO2: 26 mmol/L (ref 22–32)
Calcium: 9.5 mg/dL (ref 8.9–10.3)
Chloride: 107 mmol/L (ref 98–111)
Creatinine: 1.08 mg/dL — ABNORMAL HIGH (ref 0.44–1.00)
GFR, Est AFR Am: >60 mL/min (ref >60)
GFR, Estimated: 52 mL/min — ABNORMAL LOW (ref >60)
Glucose, Bld: 101 mg/dL — ABNORMAL HIGH (ref 70–99)
Potassium: 3.6 mmol/L (ref 3.5–5.1)
Sodium: 141 mmol/L (ref 135–145)
Total Bilirubin: 0.4 mg/dL (ref 0.3–1.2)
Total Protein: 6 g/dL — ABNORMAL LOW (ref 6.5–8.1)

## 2019-06-22 LAB — CBC WITH DIFFERENTIAL (CANCER CENTER ONLY)
Abs Immature Granulocytes: 0.08 10*3/uL — ABNORMAL HIGH (ref 0.00–0.07)
Basophils Absolute: 0 10*3/uL (ref 0.0–0.1)
Basophils Relative: 0 %
Eosinophils Absolute: 0.1 10*3/uL (ref 0.0–0.5)
Eosinophils Relative: 1 %
HCT: 33.4 % — ABNORMAL LOW (ref 36.0–46.0)
Hemoglobin: 11.3 g/dL — ABNORMAL LOW (ref 12.0–15.0)
Immature Granulocytes: 1 %
Lymphocytes Relative: 20 %
Lymphs Abs: 1.6 10*3/uL (ref 0.7–4.0)
MCH: 33.6 pg (ref 26.0–34.0)
MCHC: 33.8 g/dL (ref 30.0–36.0)
MCV: 99.4 fL (ref 80.0–100.0)
Monocytes Absolute: 0.5 10*3/uL (ref 0.1–1.0)
Monocytes Relative: 6 %
Neutro Abs: 5.5 10*3/uL (ref 1.7–7.7)
Neutrophils Relative %: 72 %
Platelet Count: 267 10*3/uL (ref 150–400)
RBC: 3.36 MIL/uL — ABNORMAL LOW (ref 3.87–5.11)
RDW: 13 % (ref 11.5–15.5)
WBC Count: 7.7 10*3/uL (ref 4.0–10.5)
nRBC: 0 % (ref 0.0–0.2)

## 2019-06-22 LAB — IRON AND TIBC
Iron: 81 ug/dL (ref 41–142)
Saturation Ratios: 31 % (ref 21–57)
TIBC: 265 ug/dL (ref 236–444)
UIBC: 184 ug/dL (ref 120–384)

## 2019-06-22 LAB — LACTATE DEHYDROGENASE: LDH: 170 U/L (ref 98–192)

## 2019-06-22 LAB — FERRITIN: Ferritin: 337 ng/mL — ABNORMAL HIGH (ref 11–307)

## 2019-06-22 MED ORDER — SODIUM CHLORIDE 0.9% FLUSH
10.0000 mL | INTRAVENOUS | Status: DC | PRN
  Administered 2019-06-22: 10 mL via INTRAVENOUS
  Filled 2019-06-22: qty 10

## 2019-06-22 MED ORDER — DIPHENHYDRAMINE HCL 25 MG PO CAPS
50.0000 mg | ORAL_CAPSULE | Freq: Once | ORAL | Status: AC
  Administered 2019-06-22: 50 mg via ORAL

## 2019-06-22 MED ORDER — ACETAMINOPHEN 325 MG PO TABS
ORAL_TABLET | ORAL | Status: AC
  Filled 2019-06-22: qty 2

## 2019-06-22 MED ORDER — PREDNISONE 10 MG PO TABS: 10.0000 mg | ORAL_TABLET | Freq: Every day | ORAL | 3 refills | Status: DC

## 2019-06-22 MED ORDER — SODIUM CHLORIDE 0.9 % IV SOLN
Freq: Once | INTRAVENOUS | Status: AC
  Filled 2019-06-22: qty 250

## 2019-06-22 MED ORDER — SODIUM CHLORIDE 0.9 % IV SOLN
375.0000 mg/m2 | Freq: Once | INTRAVENOUS | Status: AC
  Administered 2019-06-22: 600 mg via INTRAVENOUS
  Filled 2019-06-22: qty 10

## 2019-06-22 MED ORDER — HEPARIN SOD (PORK) LOCK FLUSH 100 UNIT/ML IV SOLN
500.0000 [IU] | Freq: Once | INTRAVENOUS | Status: AC | PRN
  Administered 2019-06-22: 500 [IU]
  Filled 2019-06-22: qty 5

## 2019-06-22 MED ORDER — SODIUM CHLORIDE 0.9% FLUSH
10.0000 mL | INTRAVENOUS | Status: DC | PRN
  Administered 2019-06-22: 10 mL
  Filled 2019-06-22: qty 10

## 2019-06-22 MED ORDER — DIPHENHYDRAMINE HCL 25 MG PO CAPS
ORAL_CAPSULE | ORAL | Status: AC
  Filled 2019-06-22: qty 2

## 2019-06-22 MED ORDER — ACETAMINOPHEN 325 MG PO TABS
650.0000 mg | ORAL_TABLET | Freq: Once | ORAL | Status: AC
  Administered 2019-06-22: 650 mg via ORAL

## 2019-06-22 MED ORDER — FLUCONAZOLE 100 MG PO TABS: 100.0000 mg | ORAL_TABLET | Freq: Every day | ORAL | 3 refills | Status: DC

## 2019-06-22 MED ORDER — FAMOTIDINE 40 MG PO TABS: 40.0000 mg | ORAL_TABLET | Freq: Two times a day (BID) | ORAL | 3 refills | Status: DC

## 2019-06-22 NOTE — Patient Instructions (Signed)
[facility] Discharge Instructions for Patients Receiving Chemotherapy  Today you received the following chemotherapy agents Rituxan  To help prevent nausea and vomiting after your treatment, we encourage you to take your nausea medication as prescribed by MD.   If you develop nausea and vomiting that is not controlled by your nausea medication, call the clinic.   BELOW ARE SYMPTOMS THAT SHOULD BE REPORTED IMMEDIATELY:  *FEVER GREATER THAN 100.5 F  *CHILLS WITH OR WITHOUT FEVER  NAUSEA AND VOMITING THAT IS NOT CONTROLLED WITH YOUR NAUSEA MEDICATION  *UNUSUAL SHORTNESS OF BREATH  *UNUSUAL BRUISING OR BLEEDING  TENDERNESS IN MOUTH AND THROAT WITH OR WITHOUT PRESENCE OF ULCERS  *URINARY PROBLEMS  *BOWEL PROBLEMS  UNUSUAL RASH Items with * indicate a potential emergency and should be followed up as soon as possible.  Feel free to call the clinic should you have any questions or concerns. The clinic phone number is [phone].  Please show the CHEMO ALERT CARD at check-in to the Emergency Department and triage nurse.  

## 2019-06-22 NOTE — Patient Instructions (Signed)

## 2019-06-22 NOTE — Progress Notes (Signed)
Hematology and Oncology Follow Up Visit  Algie Ngai [id number] 1950/06/29 69 y.o. 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 Holzschuh is here today for follow-up.  She keeps improving.  She really looks good.  She and her husband again be heading down to [place] next week.  They will be going down there for a week.  I told her to make sure that she drinks a lot of water and that she wears sunscreen.  Her arthritis is flaring up on her.  She really needs to go back to see the rheumatologist.  I told her that we can reorder her prednisone.  10 mg a seems to help her out quite a bit.  Along with the prednisone, she will need Diflucan and Pepcid.  Her Waldenstrm's has done incredibly well.  She has M spike that is not detectable.  Her IgM level is down to 72 mg/dL.  She has had no problems with her bowels or bladder.  She is eating pretty well.  She has had no rashes.  There is been no bleeding.  She has had no fever.  There is no cough.  Overall, her performance status is now ECOG 1.   Medications:  Allergies as of 06/22/2019      Reactions   Codeine Nausea And Vomiting      Medication List       Accurate as of June 22, 2019  9:02 AM. If you have any questions, ask your nurse or doctor.        STOP taking these medications   clotrimazole 1 % cream Commonly known as: LOTRIMIN Stopped by: [name], MD   famotidine 40 MG tablet Commonly known as: PEPCID Stopped by: [name], MD   fluconazole 100 MG tablet Commonly known as: DIFLUCAN Stopped by: [name], MD   Myrbetriq 25 MG Tb24 tablet Generic drug: mirabegron ER Stopped by: [name], MD     predniSONE 10 MG tablet Commonly known as: DELTASONE Stopped by: [name], MD     TAKE these medications   aspirin 81 MG chewable tablet Chew 1 tablet (81 mg total) by mouth daily.   atorvastatin 40 MG tablet Commonly known as: LIPITOR Take 1 tablet (40 mg total) by mouth daily at 6 PM.   carvedilol 25 MG tablet Commonly known as: COREG Take 1 tablet (25 mg total) by mouth 2 (two) times daily with a meal.   clonazePAM 0.5 MG tablet Commonly known as: KLONOPIN TAKE ONE TABLET BY MOUTH TWICE DAILY   folic acid 1 MG tablet Commonly known as: FOLVITE Take 1 tablet (1 mg total) by mouth daily.   furosemide 40 MG tablet Commonly known as: LASIX Take 0.5 tablets (20 mg total) by mouth daily. Take 1/2 tablet (20 mg) by mouth in the morning and 1/2 tablet at lunch time.   hydrALAZINE 50 MG tablet Commonly known as: APRESOLINE Take 1 tablet (50 mg total) by mouth 3 (three) times daily.   isosorbide mononitrate 30 MG 24 hr tablet Commonly known as: IMDUR Take 1 tablet (30 mg total) by mouth daily.   levothyroxine 75 MCG tablet Commonly known as: SYNTHROID Take 75 mcg by mouth daily.   PARoxetine  40 MG tablet Commonly known as: PAXIL TAKE 1.5 TABLETS BY MOUTH DAILY AT BEDTIME   potassium chloride 10 MEQ tablet Commonly known as: KLOR-CON Take 1 tablet (10 mEq total) by mouth daily.   prochlorperazine 10 MG tablet Commonly known as: COMPAZINE Take 1 tablet (10 mg total) by mouth every 6 (six) hours as needed (Nausea or vomiting).   traZODone 50 MG tablet Commonly known as: DESYREL Take 1-2 tablets (50-100 mg total) by mouth at bedtime.       Allergies:  Allergies  Allergen Reactions  . Codeine Nausea And Vomiting    Past Medical History, Surgical history, Social history, and Family History were reviewed and updated.  Review of Systems: Review of Systems  Constitutional: Negative.   HENT: Negative.   Eyes: Negative.   Respiratory: Negative.    Cardiovascular: Negative.   Gastrointestinal: Negative.   Genitourinary: Negative.   Musculoskeletal: Positive for joint pain.  Skin: Negative.   Neurological: Negative.   Endo/Heme/Allergies: Negative.   Psychiatric/Behavioral: Negative.      Physical Exam:  weight is 114 lb (51.7 kg). Her temporal temperature is 96.8 F (36 C) (abnormal). Her blood pressure is 119/58 (abnormal) and her pulse is 66. Her respiration is 20 and oxygen saturation is 100%.   Wt Readings from Last 3 Encounters:  06/22/19 114 lb (51.7 kg)  04/27/19 115 lb (52.2 kg)  03/01/19 113 lb (51.3 kg)    Physical Exam Vitals reviewed.  HENT:     Head: Normocephalic and atraumatic.  Eyes:     Pupils: Pupils are equal, round, and reactive to light.  Cardiovascular:     Rate and Rhythm: Normal rate and regular rhythm.     Heart sounds: Normal heart sounds.  Pulmonary:     Effort: Pulmonary effort is normal.     Breath sounds: Normal breath sounds.  Abdominal:     General: Bowel sounds are normal.     Palpations: Abdomen is soft.  Musculoskeletal:        General: No tenderness or deformity. Normal range of motion.     Cervical back: Normal range of motion.  Lymphadenopathy:     Cervical: No cervical adenopathy.  Skin:    General: Skin is warm and dry.     Findings: No erythema or rash.  Neurological:     Mental Status: She is alert and oriented to person, place, and time.  Psychiatric:        Behavior: Behavior normal.        Thought Content: Thought content normal.        Judgment: Judgment normal.      Lab Results  Component Value Date   WBC 7.7 06/22/2019   HGB 11.3 (L) 06/22/2019   HCT 33.4 (L) 06/22/2019   MCV 99.4 06/22/2019   PLT 267 06/22/2019   Lab Results  Component Value Date   FERRITIN 400 (H) 04/27/2019   IRON 94 04/27/2019   TIBC 258 04/27/2019   UIBC 163 04/27/2019   IRONPCTSAT 37 04/27/2019   Lab Results  Component Value Date   RETICCTPCT 2.0 04/27/2019   RBC  3.36 (L) 06/22/2019   Lab Results  Component Value Date   KPAFRELGTCHN 17.4 04/27/2019   LAMBDASER 12.2 04/27/2019   KAPLAMBRATIO 1.43 04/27/2019   Lab Results  Component Value Date   IGGSERUM 163 (L) 04/27/2019   IGA 54 (L) 04/27/2019   IGMSERUM 72 04/27/2019   Lab Results  Component Value Date   TOTALPROTELP 5.5 (L)  04/27/2019   ALBUMINELP 3.8 04/27/2019   A1GS 0.2 04/27/2019   A2GS 0.8 04/27/2019   BETS 0.6 (L) 04/27/2019   GAMS 0.2 (L) 04/27/2019   MSPIKE Not Observed 04/27/2019   SPEI Comment 09/23/2018     Chemistry      Component Value Date/Time   NA 141 06/22/2019 0830   K 3.6 06/22/2019 0830   CL 107 06/22/2019 0830   CO2 26 06/22/2019 0830   BUN 18 06/22/2019 0830   CREATININE 1.08 (H) 06/22/2019 0830      Component Value Date/Time   CALCIUM 9.5 06/22/2019 0830   ALKPHOS 58 06/22/2019 0830   AST 18 06/22/2019 0830   ALT 21 06/22/2019 0830   BILITOT 0.4 06/22/2019 0830       Impression and Plan: Ms. Bush is a very pleasant 69 yo caucasian female with Waldenstrm's macroglobulinemia.  She has responded very well to Rituxan.  We initially had her on Rituxan with Cytoxan but she cannot tolerate the Cytoxan with her blood counts going down.  I will now plan for just maintenance Rituxan.  I think maintenance Rituxan every 2 months would be reasonable for right now.  I think we have to continue on the Rituxan probably until July 2022.  She really was only able to have 1 cycle of the Cytoxan along with the Rituxan.  We will plan to see her back in 2 more months.   [name], MD 3/5/20219:02 AM

## 2019-06-23 LAB — IGG, IGA, IGM
IgA: 55 mg/dL — ABNORMAL LOW (ref 87–352)
IgG (Immunoglobin G), Serum: 189 mg/dL — ABNORMAL LOW (ref 586–1602)
IgM (Immunoglobulin M), Srm: 78 mg/dL (ref 26–217)

## 2019-06-25 LAB — KAPPA/LAMBDA LIGHT CHAINS
Kappa free light chain: 18 mg/L (ref 3.3–19.4)
Kappa, lambda light chain ratio: 1.25 (ref 0.26–1.65)
Lambda free light chains: 14.4 mg/L (ref 5.7–26.3)

## 2019-06-27 LAB — IMMUNOFIXATION REFLEX, SERUM
IgA: 62 mg/dL — ABNORMAL LOW (ref 87–352)
IgG (Immunoglobin G), Serum: 218 mg/dL — ABNORMAL LOW (ref 586–1602)
IgM (Immunoglobulin M), Srm: 87 mg/dL (ref 26–217)

## 2019-06-27 LAB — PROTEIN ELECTROPHORESIS, SERUM, WITH REFLEX
A/G Ratio: 1.8 — ABNORMAL HIGH (ref 0.7–1.7)
Albumin ELP: 3.6 g/dL (ref 2.9–4.4)
Alpha-1-Globulin: 0.2 g/dL (ref 0.0–0.4)
Alpha-2-Globulin: 0.8 g/dL (ref 0.4–1.0)
Beta Globulin: 0.7 g/dL (ref 0.7–1.3)
Gamma Globulin: 0.2 g/dL — ABNORMAL LOW (ref 0.4–1.8)
Globulin, Total: 2 g/dL — ABNORMAL LOW (ref 2.2–3.9)
SPEP Interpretation: 0
Total Protein ELP: 5.6 g/dL — ABNORMAL LOW (ref 6.0–8.5)

## 2019-06-30 ENCOUNTER — Other Ambulatory Visit: Payer: Self-pay | Admitting: Nurse Practitioner

## 2019-07-31 ENCOUNTER — Ambulatory Visit (INDEPENDENT_AMBULATORY_CARE_PROVIDER_SITE_OTHER): Payer: Medicare Other | Admitting: Psychiatry

## 2019-07-31 ENCOUNTER — Other Ambulatory Visit: Payer: Self-pay

## 2019-07-31 ENCOUNTER — Encounter: Payer: Self-pay | Admitting: Psychiatry

## 2019-07-31 DIAGNOSIS — F5105 Insomnia due to other mental disorder: Secondary | ICD-10-CM | POA: Diagnosis not present

## 2019-07-31 DIAGNOSIS — F4001 Agoraphobia with panic disorder: Secondary | ICD-10-CM

## 2019-07-31 DIAGNOSIS — F330 Major depressive disorder, recurrent, mild: Secondary | ICD-10-CM

## 2019-07-31 MED ORDER — PAROXETINE HCL 40 MG PO TABS: 60.0000 mg | ORAL_TABLET | Freq: Every day | ORAL | 1 refills | Status: DC

## 2019-07-31 MED ORDER — TRAZODONE HCL 50 MG PO TABS: 50.0000 mg | ORAL_TABLET | Freq: Every day | ORAL | 1 refills | Status: DC

## 2019-07-31 MED ORDER — CLONAZEPAM 0.5 MG PO TABS: 0.5000 mg | ORAL_TABLET | Freq: Two times a day (BID) | ORAL | 1 refills | Status: DC

## 2019-07-31 NOTE — Progress Notes (Signed)
Zyian Klim [id number] [birth year] 69 y.o.  Subjective:   Patient ID:  Tiffany Leon is a 69 y.o. (DOB 28-Jun-1950) female.  Chief Complaint:  Chief Complaint  Patient presents with  . Follow-up  . Depression  . Anxiety    Anxiety Symptoms include dizziness. Patient reports no confusion, decreased concentration, nervous/anxious behavior or suicidal ideas.    Depression        Associated symptoms include fatigue and appetite change.  Associated symptoms include no decreased concentration and no suicidal ideas.  Past medical history includes anxiety.    Tiffany Leon presents to the office today for follow-up of depression and anxiety.  Patient seen in January 2020.  She was experiencing some depression.  She had previously benefited by the addition of Wellbutrin XL 300 mg daily and that was restarted per her request.  Last seen September 2020.  The following was noted. Weaker on the left side in legs.   Dx Waldenstrom's macroglobunemia, anemia, CKD.  Dx about end of May. Started Wellbutrin after the last visit and it gave her death thoughts and insomnia and stopped.  Took it briefly.  Feels she's handling the stress OK.  Worry over htn which has been hard to control.  Tired and weak.  Losing weight.  Has PT.  Getting chemotx. She had mild anxiety and depression.  No meds were changed.  As of July 31, 2019 the following is noted: Cancer in remission.  Hopes last chemotx soon.  Cardiology May.  Getting stronger. Started driving again in March after a year.  Has GD with her 69 yo.   Overall doing pretty well.   No sig alcohol.  Has been excessive at times in the past.  Tendency to seasonal depression continues but is getting a little better.  Pt reports that mood is Anxious and Depressed and describes anxiety as Minimal. Anxiety symptoms include: Excessive Worry,.  No sig panic.   Pt reports no sleep issues and naps daily. Still needs trazodone.   Not as excessive as in the past. Pt reports  that appetite is poor with 27# lost. Pt reports that energy is poor but retains some  interest or pleasure in usual activities, poor motivation and withdrawn from usual activities. Concentration is down slightly. Suicidal thoughts:  denied by patient.  Past Psychiatric Medication Trials: Wellbutrin SE, paroxetine, clonazepam  Review of Systems:  Review of Systems  Constitutional: Positive for appetite change and fatigue.  Neurological: Positive for dizziness and weakness. Negative for tremors.  Psychiatric/Behavioral: Positive for depression and dysphoric mood. Negative for agitation, behavioral problems, confusion, decreased concentration, hallucinations, self-injury, sleep disturbance and suicidal ideas. The patient is not nervous/anxious and is not hyperactive.     Medications: I have reviewed the patient's current medications.  Current Outpatient Medications  Medication Sig Dispense Refill  . aspirin 81 MG chewable tablet Chew 1 tablet (81 mg total) by mouth daily.    Marland Kitchen atorvastatin (LIPITOR) 40 MG tablet Take 1 tablet (40 mg total) by mouth daily at 6 PM. 90 tablet 3  . carvedilol (COREG) 25 MG tablet Take 1 tablet (25 mg total) by mouth 2 (two) times daily with a meal. 60 tablet 11  . famotidine (PEPCID) 40 MG tablet Take 1 tablet (40 mg total) by mouth 2 (two) times daily. 60 tablet 3  . fluconazole (DIFLUCAN) 100 MG tablet Take 1 tablet (100 mg total) by mouth daily. 30 tablet 3  . folic acid (FOLVITE) 1 MG tablet Take 1 tablet (1 mg  total) by mouth daily.    . furosemide (LASIX) 40 MG tablet Take 0.5 tablets (20 mg total) by mouth daily. Take 1/2 tablet (20 mg) by mouth in the morning and 1/2 tablet at lunch time. 90 tablet 1  . hydrALAZINE (APRESOLINE) 50 MG tablet Take 1 tablet (50 mg total) by mouth 3 (three) times daily. 180 tablet 1  . isosorbide mononitrate (IMDUR) 30 MG 24 hr tablet Take 1 tablet (30 mg total) by mouth daily. 90 tablet 3  . levothyroxine (SYNTHROID) 75 MCG  tablet Take 75 mcg by mouth daily.    . potassium chloride (K-DUR) 10 MEQ tablet Take 1 tablet (10 mEq total) by mouth daily. 90 tablet 3  . predniSONE (DELTASONE) 10 MG tablet Take 1 tablet (10 mg total) by mouth daily with breakfast. 30 tablet 3  . prochlorperazine (COMPAZINE) 10 MG tablet Take 1 tablet (10 mg total) by mouth every 6 (six) hours as needed (Nausea or vomiting). 40 tablet 1  . clonazePAM (KLONOPIN) 0.5 MG tablet Take 1 tablet (0.5 mg total) by mouth 2 (two) times daily. 180 tablet 1  . PARoxetine (PAXIL) 40 MG tablet Take 1.5 tablets (60 mg total) by mouth daily. 135 tablet 1  . traZODone (DESYREL) 50 MG tablet Take 1-2 tablets (50-100 mg total) by mouth at bedtime. 180 tablet 1   No current facility-administered medications for this visit.    Medication Side Effects: None  Allergies:  Allergies  Allergen Reactions  . Codeine Nausea And Vomiting    Past Medical History:  Diagnosis Date  . Acute kidney injury ([place])    a. 09/2018  . Anxiety   . Aortic atherosclerosis ([place])    a. 09/2018 noted on Chest CT.  Marland Kitchen Chronic combined systolic (congestive) and diastolic (congestive) heart failure ([place])    a. 09/2018 Echo: EF 45-50%, diff HK. Triv to small circumfirential pericardial eff.  . Chronic DOE (dyspnea on exertion)   . Claudication [facility])    a. Bilat hip claudication since ~ 2019.  Marland Kitchen Depression   . Dyspnea   . Emphysema lung ([place])   . Exertional angina ([facility])    a. Ex angina since ~ 2019.  Marland Kitchen GERD (gastroesophageal reflux disease)   . Goals of care, counseling/discussion 11/03/2018  . Hiatal hernia   . History of bronchitis   . History of kidney stones   . Hypothyroidism   . Pleural effusion    a. 09/2018 s/p thoracentesis-->937ml.  . Pleural effusion 09/2018  . Rash 10/2018   Rash began Saturday with unknown reason.  MD seen Monday 10/23/18  . Resting tremor   . Tobacco abuse   . Waldenstrom's macroglobulinemia ([place]) 11/03/2018    Family History  Problem  Relation Age of Onset  . Stroke Mother        Mini-strokes. Died @ 107.  . Dementia Mother   . Lung cancer Father        Died in his 37's  . Addison's disease Sister   . Hypertension Brother   . CAD Brother   . Hypertension Brother     Social History   Socioeconomic History  . Marital status: Married    Spouse name: Not on file  . Number of children: Not on file  . Years of education: Not on file  . Highest education level: Not on file  Occupational History  . Not on file  Tobacco Use  . Smoking status: Current Every Day Smoker    Packs/day: 0.25  Years: 40.00    Pack years: 10.00    Types: Cigarettes  . Smokeless tobacco: Never Used  . Tobacco comment: smoking 3-4 cigarettes/day  Substance and Sexual Activity  . Alcohol use: Not Currently    Comment: 1-2 gl wine / night - none since 07/2018  . Drug use: Not Currently    Comment: prev used drugs ~ 35 yrs ago.  Marland Kitchen Sexual activity: Not on file  Other Topics Concern  . Not on file  Social History Narrative   Lives in [place] with her husband.  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   Social Determinants of Health   Financial Resource Strain:   . Difficulty of Paying Living Expenses:   Food Insecurity:   . Worried About Charity fundraiser in the Last Year:   . Arboriculturist in the Last Year:   Transportation Needs:   . Film/video editor (Medical):   Marland Kitchen Lack of Transportation (Non-Medical):   Physical Activity:   . Days of Exercise per Week:   . Minutes of Exercise per Session:   Stress:   . Feeling of Stress :   Social Connections:   . Frequency of Communication with Friends and Family:   . Frequency of Social Gatherings with Friends and Family:   . Attends Religious Services:   . Active Member of Clubs or Organizations:   . Attends Archivist Meetings:   Marland Kitchen Marital Status:   Intimate Partner Violence:   . Fear of Current or Ex-Partner:   . Emotionally Abused:   Marland Kitchen Physically Abused:    . Sexually Abused:     Past Medical History, Surgical history, Social history, and Family history were reviewed and updated as appropriate.   Please see review of systems for further details on the patient's review from today.   Objective:   Physical Exam:  There were no vitals taken for this visit.  Physical Exam Constitutional:      General: She is not in acute distress.    Appearance: She is well-developed. She is not ill-appearing.  Musculoskeletal:        General: No deformity.  Neurological:     Mental Status: She is alert and oriented to person, place, and time.     Motor: No tremor.     Coordination: Coordination normal.     Gait: Gait normal.  Psychiatric:        Attention and Perception: Attention normal. She is attentive. She does not perceive auditory hallucinations.        Mood and Affect: Mood is anxious and depressed. Affect is not labile, blunt, angry or inappropriate.        Speech: Speech normal.        Behavior: Behavior normal.        Thought Content: Thought content normal. Thought content does not include homicidal or suicidal ideation. Thought content does not include homicidal or suicidal plan.        Cognition and Memory: Cognition normal.        Judgment: Judgment normal.     Comments: Insight is good. ? Mild panic at night.     Lab Review:     Component Value Date/Time   NA 141 06/22/2019 0830   K 3.6 06/22/2019 0830   CL 107 06/22/2019 0830   CO2 26 06/22/2019 0830   GLUCOSE 101 (H) 06/22/2019 0830   BUN 18 06/22/2019 0830   CREATININE 1.08 (H) 06/22/2019 0830  CALCIUM 9.5 06/22/2019 0830   PROT 6.0 (L) 06/22/2019 0830   ALBUMIN 4.3 06/22/2019 0830   AST 18 06/22/2019 0830   ALT 21 06/22/2019 0830   ALKPHOS 58 06/22/2019 0830   BILITOT 0.4 06/22/2019 0830   GFRNONAA 52 (L) 06/22/2019 0830   GFRAA >60 06/22/2019 0830       Component Value Date/Time   WBC 7.7 06/22/2019 0830   WBC 3.1 (L) 01/23/2019 0723   RBC 3.36 (L)  06/22/2019 0830   HGB 11.3 (L) 06/22/2019 0830   HCT 33.4 (L) 06/22/2019 0830   PLT 267 06/22/2019 0830   MCV 99.4 06/22/2019 0830   MCH 33.6 06/22/2019 0830   MCHC 33.8 06/22/2019 0830   RDW 13.0 06/22/2019 0830   LYMPHSABS 1.6 06/22/2019 0830   MONOABS 0.5 06/22/2019 0830   EOSABS 0.1 06/22/2019 0830   BASOSABS 0.0 06/22/2019 0830   Normal TSH per PCP.  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  She is having mild symptoms of each.  She did not tolerate the Wellbutrin.   She does not want to start new medications because she is having to take a lot of medications at this point.  She is having physical therapy.  She is getting chemotherapy.  She is tolerating paroxetine and clonazepam well and feels like she needs full dose of both.  No med changes today are indicated..    Encouraged social another types of stimulation to the extent that is possible..  Supportive therapy dealing with the cancer.  FU 6 mos  Lynder Parents, MD, DFAPA  Please see After Visit Summary for patient specific instructions.  Future Appointments  Date Time Provider [place]  08/24/2019  8:45 AM [facility] INJ NURSE [facility] None  08/24/2019  9:00 AM [facility] LAB [facility] None  08/24/2019  9:30 AM [place], Holli Humbles, NP [facility] None  08/24/2019 10:30 AM [facility] A2 [facility] None    No orders of the defined types were placed in this encounter.     -------------------------------

## 2019-08-17 NOTE — Progress Notes (Signed)
Pharmacist Chemotherapy Monitoring - Follow Up Assessment    I verify that I have reviewed each item in the below checklist:  . Regimen for the patient is scheduled for the appropriate day and plan matches scheduled date. Marland Kitchen Appropriate non-routine labs are ordered dependent on drug ordered. . If applicable, additional medications reviewed and ordered per protocol based on lifetime cumulative doses and/or treatment regimen.   Plan for follow-up and/or issues identified: Yes . I-vent associated with next due treatment: Yes . MD and/or nursing notified: Yes  Tanecia Mccay, Jacqlyn Larsen 08/17/2019 7:57 AM

## 2019-08-24 ENCOUNTER — Inpatient Hospital Stay: Payer: Medicare Other

## 2019-08-24 ENCOUNTER — Inpatient Hospital Stay: Payer: Medicare Other | Attending: Hematology & Oncology | Admitting: Family

## 2019-08-24 ENCOUNTER — Telehealth: Payer: Self-pay | Admitting: Family

## 2019-08-24 ENCOUNTER — Encounter: Payer: Self-pay | Admitting: Family

## 2019-08-24 ENCOUNTER — Other Ambulatory Visit: Payer: Self-pay

## 2019-08-24 VITALS — BP 102/64 | HR 62 | Resp 17

## 2019-08-24 VITALS — BP 110/51 | HR 58 | Temp 97.7°F | Resp 18 | Ht 66.0 in | Wt 117.1 lb

## 2019-08-24 DIAGNOSIS — N1831 Chronic kidney disease, stage 3a: Secondary | ICD-10-CM | POA: Diagnosis not present

## 2019-08-24 DIAGNOSIS — N189 Chronic kidney disease, unspecified: Secondary | ICD-10-CM | POA: Insufficient documentation

## 2019-08-24 DIAGNOSIS — Z79899 Other long term (current) drug therapy: Secondary | ICD-10-CM | POA: Diagnosis not present

## 2019-08-24 DIAGNOSIS — Z5112 Encounter for antineoplastic immunotherapy: Secondary | ICD-10-CM | POA: Diagnosis not present

## 2019-08-24 DIAGNOSIS — D631 Anemia in chronic kidney disease: Secondary | ICD-10-CM | POA: Diagnosis not present

## 2019-08-24 DIAGNOSIS — E611 Iron deficiency: Secondary | ICD-10-CM | POA: Diagnosis not present

## 2019-08-24 DIAGNOSIS — C880 Waldenstrom macroglobulinemia: Secondary | ICD-10-CM

## 2019-08-24 LAB — FERRITIN: Ferritin: 238 ng/mL (ref 11–307)

## 2019-08-24 LAB — CBC WITH DIFFERENTIAL (CANCER CENTER ONLY)
Abs Immature Granulocytes: 0.3 10*3/uL — ABNORMAL HIGH (ref 0.00–0.07)
Basophils Absolute: 0 10*3/uL (ref 0.0–0.1)
Basophils Relative: 1 %
Eosinophils Absolute: 0.1 10*3/uL (ref 0.0–0.5)
Eosinophils Relative: 1 %
HCT: 36.5 % (ref 36.0–46.0)
Hemoglobin: 12.2 g/dL (ref 12.0–15.0)
Immature Granulocytes: 5 %
Lymphocytes Relative: 28 %
Lymphs Abs: 1.7 10*3/uL (ref 0.7–4.0)
MCH: 33.1 pg (ref 26.0–34.0)
MCHC: 33.4 g/dL (ref 30.0–36.0)
MCV: 98.9 fL (ref 80.0–100.0)
Monocytes Absolute: 0.5 10*3/uL (ref 0.1–1.0)
Monocytes Relative: 8 %
Neutro Abs: 3.4 10*3/uL (ref 1.7–7.7)
Neutrophils Relative %: 57 %
Platelet Count: 244 10*3/uL (ref 150–400)
RBC: 3.69 MIL/uL — ABNORMAL LOW (ref 3.87–5.11)
RDW: 12.5 % (ref 11.5–15.5)
WBC Count: 6 10*3/uL (ref 4.0–10.5)
nRBC: 0 % (ref 0.0–0.2)

## 2019-08-24 LAB — CMP (CANCER CENTER ONLY)
ALT: 36 U/L (ref 0–44)
AST: 24 U/L (ref 15–41)
Albumin: 4.2 g/dL (ref 3.5–5.0)
Alkaline Phosphatase: 65 U/L (ref 38–126)
Anion gap: 7 (ref 5–15)
BUN: 21 mg/dL (ref 8–23)
CO2: 29 mmol/L (ref 22–32)
Calcium: 9.6 mg/dL (ref 8.9–10.3)
Chloride: 105 mmol/L (ref 98–111)
Creatinine: 0.98 mg/dL (ref 0.44–1.00)
GFR, Est AFR Am: >60 mL/min (ref >60)
GFR, Estimated: 58 mL/min — ABNORMAL LOW (ref >60)
Glucose, Bld: 105 mg/dL — ABNORMAL HIGH (ref 70–99)
Potassium: 3.9 mmol/L (ref 3.5–5.1)
Sodium: 141 mmol/L (ref 135–145)
Total Bilirubin: 0.3 mg/dL (ref 0.3–1.2)
Total Protein: 5.7 g/dL — ABNORMAL LOW (ref 6.5–8.1)

## 2019-08-24 LAB — RETICULOCYTES
Immature Retic Fract: 11.9 % (ref 2.3–15.9)
RBC.: 3.62 MIL/uL — ABNORMAL LOW (ref 3.87–5.11)
Retic Count, Absolute: 63.7 10*3/uL (ref 19.0–186.0)
Retic Ct Pct: 1.8 % (ref 0.4–3.1)

## 2019-08-24 LAB — IRON AND TIBC
Iron: 72 ug/dL (ref 41–142)
Saturation Ratios: 26 % (ref 21–57)
TIBC: 280 ug/dL (ref 236–444)
UIBC: 209 ug/dL (ref 120–384)

## 2019-08-24 MED ORDER — SODIUM CHLORIDE 0.9 % IV SOLN: 375.0000 mg/m2 | Freq: Once | INTRAVENOUS | Status: DC

## 2019-08-24 MED ORDER — SODIUM CHLORIDE 0.9% FLUSH
10.0000 mL | INTRAVENOUS | Status: DC | PRN
  Administered 2019-08-24: 10 mL
  Filled 2019-08-24: qty 10

## 2019-08-24 MED ORDER — SODIUM CHLORIDE 0.9 % IV SOLN
Freq: Once | INTRAVENOUS | Status: AC
  Filled 2019-08-24: qty 250

## 2019-08-24 MED ORDER — SODIUM CHLORIDE 0.9 % IV SOLN
375.0000 mg/m2 | Freq: Once | INTRAVENOUS | Status: AC
  Administered 2019-08-24: 600 mg via INTRAVENOUS
  Filled 2019-08-24: qty 50

## 2019-08-24 MED ORDER — DIPHENHYDRAMINE HCL 25 MG PO CAPS
ORAL_CAPSULE | ORAL | Status: AC
  Filled 2019-08-24: qty 2

## 2019-08-24 MED ORDER — ACETAMINOPHEN 325 MG PO TABS
650.0000 mg | ORAL_TABLET | Freq: Once | ORAL | Status: AC
  Administered 2019-08-24: 650 mg via ORAL

## 2019-08-24 MED ORDER — HEPARIN SOD (PORK) LOCK FLUSH 100 UNIT/ML IV SOLN
500.0000 [IU] | Freq: Once | INTRAVENOUS | Status: AC | PRN
  Administered 2019-08-24: 500 [IU]
  Filled 2019-08-24: qty 5

## 2019-08-24 MED ORDER — DIPHENHYDRAMINE HCL 25 MG PO CAPS
50.0000 mg | ORAL_CAPSULE | Freq: Once | ORAL | Status: AC
  Administered 2019-08-24: 50 mg via ORAL

## 2019-08-24 MED ORDER — ACETAMINOPHEN 325 MG PO TABS
ORAL_TABLET | ORAL | Status: AC
  Filled 2019-08-24: qty 2

## 2019-08-24 NOTE — Patient Instructions (Addendum)
[place] Discharge Instructions for Patients Receiving Chemotherapy  Today you received the following chemotherapy agents Rituxan  To help prevent nausea and vomiting after your treatment, we encourage you to take your nausea medication as prescribed by MD.   If you develop nausea and vomiting that is not controlled by your nausea medication, call the clinic.   BELOW ARE SYMPTOMS THAT SHOULD BE REPORTED IMMEDIATELY:  *FEVER GREATER THAN 100.5 F  *CHILLS WITH OR WITHOUT FEVER  NAUSEA AND VOMITING THAT IS NOT CONTROLLED WITH YOUR NAUSEA MEDICATION  *UNUSUAL SHORTNESS OF BREATH  *UNUSUAL BRUISING OR BLEEDING  TENDERNESS IN MOUTH AND THROAT WITH OR WITHOUT PRESENCE OF ULCERS  *URINARY PROBLEMS  *BOWEL PROBLEMS  UNUSUAL RASH Items with * indicate a potential emergency and should be followed up as soon as possible.  Feel free to call the clinic should you have any questions or concerns. The clinic phone number is [phone].  Please show the [place] at check-in to the Emergency Department and triage nurse.  Rituximab injection What is this medicine? RITUXIMAB (ri TUX i mab) is a monoclonal antibody. It is used to treat certain types of cancer like non-Hodgkin lymphoma and chronic lymphocytic leukemia. It is also used to treat rheumatoid arthritis, granulomatosis with polyangiitis (or Wegener's granulomatosis), microscopic polyangiitis, and pemphigus vulgaris. This medicine may be used for other purposes; ask your health care provider or pharmacist if you have questions. COMMON BRAND NAME(S): Rituxan, RUXIENCE What should I tell my health care provider before I take this medicine? They need to know if you have any of these conditions: heart disease infection (especially a virus infection such as hepatitis B, chickenpox, cold sores, or herpes) immune system problems irregular heartbeat kidney disease low blood counts, like low white cell, platelet,  or red cell counts lung or breathing disease, like asthma recently received or scheduled to receive a vaccine an unusual or allergic reaction to rituximab, other medicines, foods, dyes, or preservatives pregnant or trying to get pregnant breast-feeding How should I use this medicine? This medicine is for infusion into a vein. It is administered in a hospital or clinic by a specially trained health care professional. A special MedGuide will be given to you by the pharmacist with each prescription and refill. Be sure to read this information carefully each time. Talk to your pediatrician regarding the use of this medicine in children. This medicine is not approved for use in children. Overdosage: If you think you have taken too much of this medicine contact a poison control center or emergency room at once. NOTE: This medicine is only for you. Do not share this medicine with others. What if I miss a dose? It is important not to miss a dose. Call your doctor or health care professional if you are unable to keep an appointment. What may interact with this medicine? cisplatin live virus vaccines This list may not describe all possible interactions. Give your health care provider a list of all the medicines, herbs, non-prescription drugs, or dietary supplements you use. Also tell them if you smoke, drink alcohol, or use illegal drugs. Some items may interact with your medicine. What should I watch for while using this medicine? Your condition will be monitored carefully while you are receiving this medicine. You may need blood work done while you are taking this medicine. This medicine can cause serious allergic reactions. To reduce your risk you may need to take medicine before treatment with this medicine. Take your  medicine as directed. In some patients, this medicine may cause a serious brain infection that may cause death. If you have any problems seeing, thinking, speaking, walking, or standing,  tell your healthcare professional right away. If you cannot reach your healthcare professional, urgently seek other source of medical care. Call your doctor or health care professional for advice if you get a fever, chills or sore throat, or other symptoms of a cold or flu. Do not treat yourself. This drug decreases your body's ability to fight infections. Try to avoid being around people who are sick. Do not become pregnant while taking this medicine or for at least 12 months after stopping it. Women should inform their doctor if they wish to become pregnant or think they might be pregnant. There is a potential for serious side effects to an unborn child. Talk to your health care professional or pharmacist for more information. Do not breast-feed an infant while taking this medicine or for at least 6 months after stopping it. What side effects may I notice from receiving this medicine? Side effects that you should report to your doctor or health care professional as soon as possible: allergic reactions like skin rash, itching or hives; swelling of the face, lips, or tongue breathing problems chest pain changes in vision diarrhea headache with fever, neck stiffness, sensitivity to light, nausea, or confusion fast, irregular heartbeat loss of memory low blood counts - this medicine may decrease the number of white blood cells, red blood cells and platelets. You may be at increased risk for infections and bleeding. mouth sores problems with balance, talking, or walking redness, blistering, peeling or loosening of the skin, including inside the mouth signs of infection - fever or chills, cough, sore throat, pain or difficulty passing urine signs and symptoms of kidney injury like trouble passing urine or change in the amount of urine signs and symptoms of liver injury like dark yellow or brown urine; general ill feeling or flu-like symptoms; light-colored stools; loss of appetite; nausea; right upper  belly pain; unusually weak or tired; yellowing of the eyes or skin signs and symptoms of low blood pressure like dizziness; feeling faint or lightheaded, falls; unusually weak or tired stomach pain swelling of the ankles, feet, hands unusual bleeding or bruising vomiting Side effects that usually do not require medical attention (report to your doctor or health care professional if they continue or are bothersome): headache joint pain muscle cramps or muscle pain nausea tiredness This list may not describe all possible side effects. Call your doctor for medical advice about side effects. You may report side effects to FDA at 1-800-FDA-1088. Where should I keep my medicine? This drug is given in a hospital or clinic and will not be stored at home. NOTE: This sheet is a summary. It may not cover all possible information. If you have questions about this medicine, talk to your doctor, pharmacist, or health care provider.  2020 Elsevier/Gold Standard (2018-05-17 22:01:36)

## 2019-08-24 NOTE — Patient Instructions (Signed)

## 2019-08-24 NOTE — Telephone Encounter (Signed)
Appointments scheduled calendar printed per 5/7 los

## 2019-08-24 NOTE — Progress Notes (Signed)
Hematology and Oncology Follow Up Visit  Tiffany Leon [id number] 1951-02-09 69 y.o. 08/24/2019   Principle Diagnosis:  Waldenstrom's macroglobulinemia Anemia, chronic renal insufficiency, probable rheumatoid arthritis  Past Therapy: Rituxan/Cytoxan-- start cycle #1 on 11/10/2018-- cytoxan d/c'ed on 12/01/2018  Current Therapy:        Rituxan 375mg /m2 IV q 2 months -- maintanence Retacrit 40,000 units sq for Hgb < 11   Interim History:  Tiffany Leon is here today for follow-up and treatment. She is ding well but does have some fatigue and SOB with over exertion at times.  No M-spike detected in March, IgM level was 78.  No issue with infection. No fever, chills, n/v, cough, rash, dizziness, SOB, chest pain, palpitations, abdominal pain or changes in bowel or bladder habits.  No episodes of bleeding. No bruising or petechiae.  No lymphadenopathy.  No swelling, tenderness, numbness or tingling in her extremities at this time.  No falls or syncopal episodes to report.  She has maintained a good appetite and is staying well hydrated. Her weight is stable at 117 lbs.   ECOG Performance Status: 1 - Symptomatic but completely ambulatory  Medications:  Allergies as of 08/24/2019      Reactions   Codeine Nausea And Vomiting      Medication List       Accurate as of Aug 24, 2019  8:44 AM. If you have any questions, ask your nurse or doctor.        aspirin 81 MG chewable tablet Chew 1 tablet (81 mg total) by mouth daily.   atorvastatin 40 MG tablet Commonly known as: LIPITOR Take 1 tablet (40 mg total) by mouth daily at 6 PM.   carvedilol 25 MG tablet Commonly known as: COREG Take 1 tablet (25 mg total) by mouth 2 (two) times daily with a meal.   clonazePAM 0.5 MG tablet Commonly known as: KLONOPIN Take 1 tablet (0.5 mg total) by mouth 2 (two) times daily.   famotidine 40 MG tablet Commonly known as: PEPCID Take 1 tablet (40 mg total) by mouth 2 (two) times daily.     fluconazole 100 MG tablet Commonly known as: DIFLUCAN Take 1 tablet (100 mg total) by mouth daily.   folic acid 1 MG tablet Commonly known as: FOLVITE Take 1 tablet (1 mg total) by mouth daily.   furosemide 40 MG tablet Commonly known as: LASIX Take 0.5 tablets (20 mg total) by mouth daily. Take 1/2 tablet (20 mg) by mouth in the morning and 1/2 tablet at lunch time.   hydrALAZINE 50 MG tablet Commonly known as: APRESOLINE Take 1 tablet (50 mg total) by mouth 3 (three) times daily.   isosorbide mononitrate 30 MG 24 hr tablet Commonly known as: IMDUR Take 1 tablet (30 mg total) by mouth daily.   levothyroxine 75 MCG tablet Commonly known as: SYNTHROID Take 75 mcg by mouth daily.   PARoxetine 40 MG tablet Commonly known as: PAXIL Take 1.5 tablets (60 mg total) by mouth daily.   potassium chloride 10 MEQ tablet Commonly known as: KLOR-CON Take 1 tablet (10 mEq total) by mouth daily.   predniSONE 10 MG tablet Commonly known as: DELTASONE Take 1 tablet (10 mg total) by mouth daily with breakfast.   prochlorperazine 10 MG tablet Commonly known as: COMPAZINE Take 1 tablet (10 mg total) by mouth every 6 (six) hours as needed (Nausea or vomiting).   traZODone 50 MG tablet Commonly known as: DESYREL Take 1-2 tablets (50-100 mg total) by mouth at  bedtime.       Allergies:  Allergies  Allergen Reactions  . Codeine Nausea And Vomiting    Past Medical History, Surgical history, Social history, and Family History were reviewed and updated.  Review of Systems: All other 10 point review of systems is negative.   Physical Exam:  vitals were not taken for this visit.   Wt Readings from Last 3 Encounters:  06/22/19 114 lb (51.7 kg)  04/27/19 115 lb (52.2 kg)  03/01/19 113 lb (51.3 kg)    Ocular: Sclerae unicteric, pupils equal, round and reactive to light Ear-nose-throat: Oropharynx clear, dentition fair Lymphatic: No cervical, supraclavicular or axillary  adenopathy Lungs no rales or rhonchi, good excursion bilaterally Heart regular rate and rhythm, no murmur appreciated Abd soft, nontender, positive bowel sounds, no liver or spleen tip palpated on exam, no fluid wave  MSK no focal spinal tenderness, no joint edema Neuro: non-focal, well-oriented, appropriate affect Breasts: Deferred   Lab Results  Component Value Date   WBC 7.7 06/22/2019   HGB 11.3 (L) 06/22/2019   HCT 33.4 (L) 06/22/2019   MCV 99.4 06/22/2019   PLT 267 06/22/2019   Lab Results  Component Value Date   FERRITIN 337 (H) 06/22/2019   IRON 81 06/22/2019   TIBC 265 06/22/2019   UIBC 184 06/22/2019   IRONPCTSAT 31 06/22/2019   Lab Results  Component Value Date   RETICCTPCT 2.0 04/27/2019   RBC 3.36 (L) 06/22/2019   Lab Results  Component Value Date   KPAFRELGTCHN 18.0 06/22/2019   LAMBDASER 14.4 06/22/2019   KAPLAMBRATIO 1.25 06/22/2019   Lab Results  Component Value Date   IGGSERUM 189 (L) 06/22/2019   IGGSERUM 218 (L) 06/22/2019   IGA 55 (L) 06/22/2019   IGA 62 (L) 06/22/2019   IGMSERUM 78 06/22/2019   IGMSERUM 87 06/22/2019   Lab Results  Component Value Date   TOTALPROTELP 5.6 (L) 06/22/2019   ALBUMINELP 3.6 06/22/2019   A1GS 0.2 06/22/2019   A2GS 0.8 06/22/2019   BETS 0.7 06/22/2019   GAMS 0.2 (L) 06/22/2019   MSPIKE Not Observed 06/22/2019   SPEI Comment 09/23/2018     Chemistry      Component Value Date/Time   NA 141 06/22/2019 0830   K 3.6 06/22/2019 0830   CL 107 06/22/2019 0830   CO2 26 06/22/2019 0830   BUN 18 06/22/2019 0830   CREATININE 1.08 (H) 06/22/2019 0830      Component Value Date/Time   CALCIUM 9.5 06/22/2019 0830   ALKPHOS 58 06/22/2019 0830   AST 18 06/22/2019 0830   ALT 21 06/22/2019 0830   BILITOT 0.4 06/22/2019 0830       Impression and Plan: Tiffany Leon is a very pleasant 69 yo caucasian female with Waldenstrom's macroglobulinemia. She was originally on Rituxan and Cytoxan but Cytoxan was stopped due to  intolerance. She is doing well on maintenance Rituxan. She will complete in July 2022.  We will proceed with treatment today as planned.  We will see her again in another 2 months.  She will contact our office with any questions or concerns. We can certainly see her sooner if needed.   Laverna Peace, NP 5/7/20218:44 AM

## 2019-08-25 LAB — IGG, IGA, IGM
IgA: 49 mg/dL — ABNORMAL LOW (ref 64–422)
IgG (Immunoglobin G), Serum: 197 mg/dL — ABNORMAL LOW (ref 586–1602)
IgM (Immunoglobulin M), Srm: 66 mg/dL (ref 26–217)

## 2019-08-27 LAB — PROTEIN ELECTROPHORESIS, SERUM, WITH REFLEX
A/G Ratio: 1.8 — ABNORMAL HIGH (ref 0.7–1.7)
Albumin ELP: 3.7 g/dL (ref 2.9–4.4)
Alpha-1-Globulin: 0.3 g/dL (ref 0.0–0.4)
Alpha-2-Globulin: 0.9 g/dL (ref 0.4–1.0)
Beta Globulin: 0.7 g/dL (ref 0.7–1.3)
Gamma Globulin: 0.3 g/dL — ABNORMAL LOW (ref 0.4–1.8)
Globulin, Total: 2.1 g/dL — ABNORMAL LOW (ref 2.2–3.9)
Total Protein ELP: 5.8 g/dL — ABNORMAL LOW (ref 6.0–8.5)

## 2019-08-27 LAB — KAPPA/LAMBDA LIGHT CHAINS
Kappa free light chain: 15.4 mg/L (ref 3.3–19.4)
Kappa, lambda light chain ratio: 1.18 (ref 0.26–1.65)
Lambda free light chains: 13 mg/L (ref 5.7–26.3)

## 2019-08-28 ENCOUNTER — Other Ambulatory Visit: Payer: Self-pay | Admitting: Family Medicine

## 2019-08-28 DIAGNOSIS — J449 Chronic obstructive pulmonary disease, unspecified: Secondary | ICD-10-CM

## 2019-08-28 DIAGNOSIS — F17200 Nicotine dependence, unspecified, uncomplicated: Secondary | ICD-10-CM

## 2019-08-31 ENCOUNTER — Ambulatory Visit: Payer: Medicare Other | Admitting: Internal Medicine

## 2019-08-31 ENCOUNTER — Encounter: Payer: Self-pay | Admitting: Internal Medicine

## 2019-08-31 ENCOUNTER — Other Ambulatory Visit: Payer: Self-pay

## 2019-08-31 VITALS — BP 125/65 | HR 62 | Temp 94.8°F | Ht 64.0 in | Wt 117.4 lb

## 2019-08-31 DIAGNOSIS — I5022 Chronic systolic (congestive) heart failure: Secondary | ICD-10-CM | POA: Diagnosis not present

## 2019-08-31 DIAGNOSIS — I428 Other cardiomyopathies: Secondary | ICD-10-CM

## 2019-08-31 MED ORDER — VALSARTAN 80 MG PO TABS
80.0000 mg | ORAL_TABLET | Freq: Every day | ORAL | 3 refills | Status: DC
Start: 2019-08-31 — End: 2020-06-12

## 2019-08-31 NOTE — Progress Notes (Signed)
OFFICE NOTE  Chief Complaint:  Fatigue, weakness, joint pain  Primary Care Physician: Aretta Nip, MD  HPI:  Lakely Elmendorf is a 69 y.o. female with a past medial history significant for chronic combined systolic and diastolic congestive heart failure with recent echo in June 2020 showed EF 45 to 50% and diffuse hypokinesis with trivial to small pericardial effusion, aortic atherosclerosis and coronary artery calcifications noted on chest CT, acute kidney injury, pleural effusion status post thoracentesis, recent elevated sedimentation rate greater than 100 and other medical problems.  She was seen by me recently in the hospital for heart failure.  Initially she had acute renal failure and markedly elevated sedimentation rate.  She responded to diuresis with a plan for outpatient medication adjustment and ischemia work-up.  She was seen in a telemedicine visit by Doreene Adas, PA-C on 10/13/2018.  Blood pressure and still not been well controlled.  She was placed on BiDil in the hospital however this was cost effective and therefore was changed to hydralazine and Imdur.  Today her blood pressure remains elevated 168/88.  She is accompanied by her husband who notes that her blood pressures were quite high at home up to 761 systolic.  She says she feels poorly.  She was in a wheelchair and cannot walk up into the office today.  She gets chills but no fevers.  Recent COVID testing was negative in the hospital.  She has seen Dr. Marin Olp with oncology who feels that she may have either hematologic malignancy and/or rheumatoid arthritis.  She did have a high RF test and given her high sedimentation rate this is a reasonable differential diagnosis.  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  Pan CT scans are pending as well as rheumatologic referral.  Heart failure wise she denies any worsening edema.  Her weight is much lower than dry weight by about 7 pounds today.  Appetite is  decreased.  Remains on the diuretic.  Multiple labs ordered by her oncologist about 4 days ago are pending.  01/29/2019  Ms. Raucci returns today for follow-up.  She apparently saw Dr. Trudie Reed with rheumatology this morning, who felt that she did not have clinical joint symptoms consistent with RA however she was on treatment for her Shea Evans which is also treatment for RA.  Symptomatically she is improved somewhat.  Her oncologist feels that she could undergo ischemic evaluation.  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  08/31/2019  Ms. Kopec is seen today in follow-up.  Overall she says she is doing pretty well.  She denies any worsening shortness of breath or chest pain.  She is still undergoing some chemotherapy and has a low-dose CT coming up in May.  Weight is up about 4 pounds.  She did have some systolic heart failure with EF 45 to 50%.  She does not seem to recall this and when I told her today she seemed quite upset.  I explained it was only mildly reduced last year however the goal was to try to see if we could get her on optimal heart failure therapy.  That being said my plan was to try to see if I could get her off of the hydralazine and onto an ARB if her renal function improved.  Her creatinine most recently was 0.98 indicating that it has improved.  PMHx:  Past Medical History:  Diagnosis Date  . Acute kidney  injury ([place])    a. 09/2018  . Anxiety   . Aortic atherosclerosis ([place])    a. 09/2018 noted on Chest CT.  Marland Kitchen Chronic combined systolic (congestive) and diastolic (congestive) heart failure ([place])    a. 09/2018 Echo: EF 45-50%, diff HK. Triv to small circumfirential pericardial eff.  . Chronic DOE (dyspnea on exertion)   . Claudication [facility])    a. Bilat hip claudication since ~ 2019.  Marland Kitchen Depression   . Dyspnea   . Emphysema lung ([place])   . Exertional angina ([facility])    a. Ex angina since ~ 2019.  Marland Kitchen  GERD (gastroesophageal reflux disease)   . Goals of care, counseling/discussion 11/03/2018  . Hiatal hernia   . History of bronchitis   . History of kidney stones   . Hypothyroidism   . Pleural effusion    a. 09/2018 s/p thoracentesis-->918m.  . Pleural effusion 09/2018  . Rash 10/2018   Rash began Saturday with unknown reason.  MD seen Monday 10/23/18  . Resting tremor   . Tobacco abuse   . Waldenstrom's macroglobulinemia ([facility] 11/03/2018    Past Surgical History:  Procedure Laterality Date  . BREAST LUMPECTOMY WITH RADIOACTIVE SEED LOCALIZATION Left 12/28/2017   Procedure: BREAST LUMPECTOMY WITH RADIOACTIVE SEED LOCALIZATION;  Surgeon: TJovita Kussmaul MD;  [facility] [facility]  Service: General;  Laterality: Left;  . DG THUMB RIGHT HAND ([place])    . IR IMAGING GUIDED PORT INSERTION  11/09/2018  . IR THORACENTESIS ASP PLEURAL SPACE W/IMG GUIDE  09/20/2018  . KIDNEY STONE SURGERY      FAMHx:  Family History  Problem Relation Age of Onset  . Stroke Mother        Mini-strokes. Died @ 643  . Dementia Mother   . Lung cancer Father        Died in his 764's . Addison's disease Sister   . Hypertension Brother   . CAD Brother   . Hypertension Brother     SOCHx:   reports that she has been smoking cigarettes. She has a 10.00 pack-year smoking history. She has never used smokeless tobacco. She reports previous alcohol use. She reports previous drug use.  ALLERGIES:  Allergies  Allergen Reactions  . Codeine Nausea And Vomiting    ROS: Pertinent items noted in HPI and remainder of comprehensive ROS otherwise negative.  HOME MEDS: Current Outpatient Medications on File Prior to Visit  Medication Sig Dispense Refill  . aspirin 81 MG chewable tablet Chew 1 tablet (81 mg total) by mouth daily.    .Marland Kitchenatorvastatin (LIPITOR) 40 MG tablet Take 1 tablet (40 mg total) by mouth daily at 6 PM. 90 tablet 3  . carvedilol (COREG) 25 MG tablet Take 1 tablet (25 mg total) by mouth 2 (two) times  daily with a meal. 60 tablet 11  . clonazePAM (KLONOPIN) 0.5 MG tablet Take 1 tablet (0.5 mg total) by mouth 2 (two) times daily. 180 tablet 1  . famotidine (PEPCID) 40 MG tablet Take 1 tablet (40 mg total) by mouth 2 (two) times daily. 60 tablet 3  . fluconazole (DIFLUCAN) 100 MG tablet Take 1 tablet (100 mg total) by mouth daily. 30 tablet 3  . folic acid (FOLVITE) 1 MG tablet Take 1 tablet (1 mg total) by mouth daily.    . furosemide (LASIX) 40 MG tablet Take 0.5 tablets (20 mg total) by mouth daily. Take 1/2 tablet (20 mg) by mouth in the morning and 1/2 tablet at  lunch time. 90 tablet 1  . hydrALAZINE (APRESOLINE) 50 MG tablet Take 1 tablet (50 mg total) by mouth 3 (three) times daily. 180 tablet 1  . isosorbide mononitrate (IMDUR) 30 MG 24 hr tablet Take 1 tablet (30 mg total) by mouth daily. 90 tablet 3  . levothyroxine (SYNTHROID) 75 MCG tablet Take 75 mcg by mouth daily.    Marland Kitchen PARoxetine (PAXIL) 40 MG tablet Take 1.5 tablets (60 mg total) by mouth daily. 135 tablet 1  . potassium chloride (K-DUR) 10 MEQ tablet Take 1 tablet (10 mEq total) by mouth daily. 90 tablet 3  . predniSONE (DELTASONE) 10 MG tablet Take 1 tablet (10 mg total) by mouth daily with breakfast. 30 tablet 3  . prochlorperazine (COMPAZINE) 10 MG tablet Take 1 tablet (10 mg total) by mouth every 6 (six) hours as needed (Nausea or vomiting). 40 tablet 1  . traZODone (DESYREL) 50 MG tablet Take 1-2 tablets (50-100 mg total) by mouth at bedtime. 180 tablet 1   No current facility-administered medications on file prior to visit.    LABS/IMAGING: No results found for this or any previous visit (from the past 48 hour(s)). No results found.  LIPID PANEL:    Component Value Date/Time   CHOL 121 09/28/2018 0328   TRIG 102 09/28/2018 0328   HDL 29 (L) 09/28/2018 0328   CHOLHDL 4.2 09/28/2018 0328   VLDL 20 09/28/2018 0328   LDLCALC 72 09/28/2018 0328     WEIGHTS: Wt Readings from Last 3 Encounters:  08/31/19 117 lb 6.4  oz (53.3 kg)  08/24/19 117 lb 1.9 oz (53.1 kg)  06/22/19 114 lb (51.7 kg)    VITALS: BP 125/65   Pulse 62   Temp (!) 94.8 F (34.9 C)   Ht 5' 4"  (1.626 m)   Wt 117 lb 6.4 oz (53.3 kg)   SpO2 96%   BMI 20.15 kg/m   EXAM: General appearance: alert, no distress and Appears thin and fatigued in wheelchair Neck: no carotid bruit, no JVD and thyroid not enlarged, symmetric, no tenderness/mass/nodules Lungs: diminished breath sounds bilaterally Heart: regular rate and rhythm Abdomen: soft, non-tender; bowel sounds normal; no masses,  no organomegaly Extremities: extremities normal, atraumatic, no cyanosis or edema Pulses: 2+ and symmetric Skin: Pale, cool, dry Neurologic: Mental status: Alert, oriented, thought content appropriate Psych: Fatigue  EKG: Normal sinus rhythm 62-personally reviewed  ASSESSMENT: 1. Chronic systolic congestive heart failure-LVEF 45-50% 2. Multivessel coronary artery calcification 3. Elevated ESR-possible rheumatoid arthritis versus malignancy 4. Recent acute kidney injury  PLAN: 1.   Ms. Donson has chronic systolic congestive heart failure with EF 45 to 50%.  As her renal function has improved I would like to start her on some valsartan 80 mg daily.  We will also wean her down off of the hydralazine, initially reducing the dose to 50 mg twice daily for 1 week and then 50 mg daily.  She will then stop that medicine.  She should monitor blood pressure at home and if she has any low systolic blood pressures below 100 then consider decreasing it to only once daily earlier.  Plan follow-up with me in about 6 months.  We will plan a repeat echo at that time.  Pixie Casino, MD, [facility], [place] Director of the Advanced Lipid Disorders &  Cardiovascular Risk Reduction Clinic Diplomate of the American Board of Clinical Lipidology Attending Cardiologist  Direct Dial: [phone]  Fax: [phone]  Website:  [email or web address] Guenevere Roorda 08/31/2019, 1:23 PM

## 2019-08-31 NOTE — Patient Instructions (Signed)
Medication Instructions:  DECREASE hydralazine to 50mg  twice daily for 1 week Then DECREASE hydralazine to 50mg  once daily for 1 week Then STOP medication   START valasartan 80mg  daily today (or as soon as you get from the pharmacy)  Please check BP at home If you have consistent BP readings under 100 (top number) please go ahead and decrease hydralazine to once daily  *If you need a refill on your cardiac medications before your next appointment, please call your pharmacy*  Testing/Procedures: ECHOCARDIOGRAM @ [address] in 6 months  Follow-Up: At [facility], you and your health needs are our priority.  As part of our continuing mission to provide you with exceptional heart care, we have created designated Provider Care Teams.  These Care Teams include your primary Cardiologist (physician) and Advanced Practice Providers (APPs -  Physician Assistants and Nurse Practitioners) who all work together to provide you with the care you need, when you need it.  We recommend signing up for the patient portal called "MyChart".  Sign up information is provided on this After Visit Summary.  MyChart is used to connect with patients for Virtual Visits (Telemedicine).  Patients are able to view lab/test results, encounter notes, upcoming appointments, etc.  Non-urgent messages can be sent to your provider as well.   To learn more about what you can do with MyChart, go to [email or web address].    Your next appointment:   6 month(s)  The format for your next appointment:   In Person  Provider:   You may see Pixie Casino, MD or one of the following Advanced Practice Providers on your designated Care Team:    Almyra Deforest, PA-C  Fabian Sharp, PA-C or   Roby Lofts, [place]    Other Instructions

## 2019-09-12 ENCOUNTER — Ambulatory Visit: Payer: Medicare Other

## 2019-09-26 IMAGING — CT CT BIOPSY
1 of 2 series · 15 of 28 positions shown, 19 images · non-contrast
Comparison: none

INDICATION: 68-year-old female with a history of monoclonal gammopathy

[Series 2: i-spiral 5.0 b40f · axial · 0.98mm/px · z∈[+1162,+1252]mm · 15 of 30 slices shown, 19 images]
[im 2/30  mediastinal]
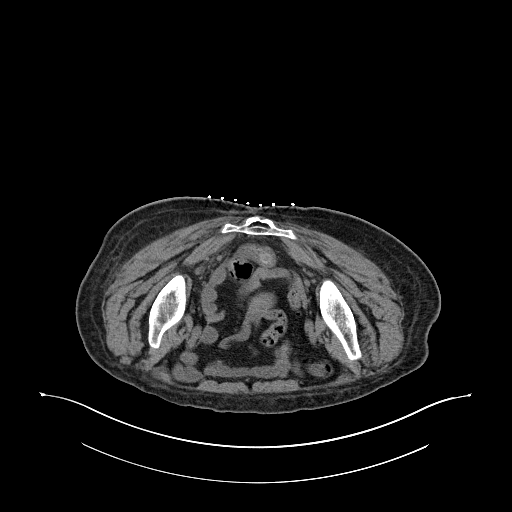
[im 2/30  lung]
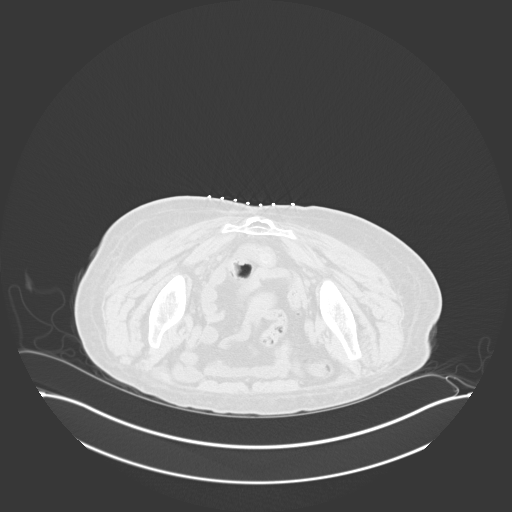
[im 4/30  lung]
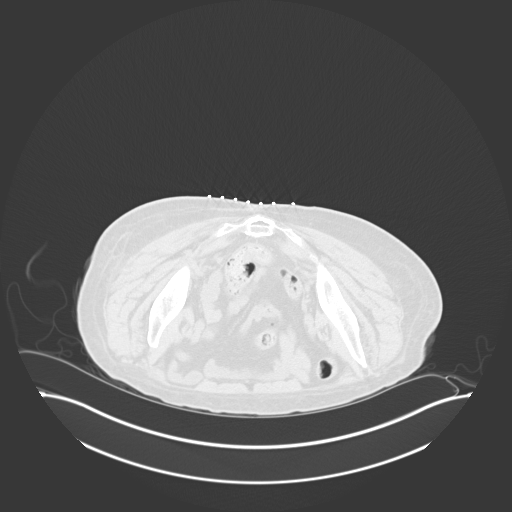
[im 5/30  lung]
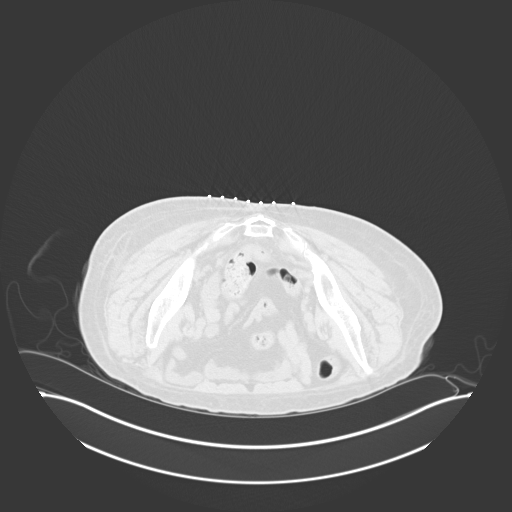
[im 8/30  lung]
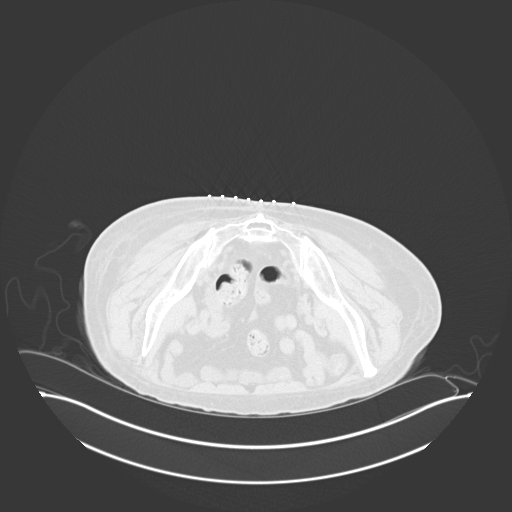
[im 9/30  mediastinal]
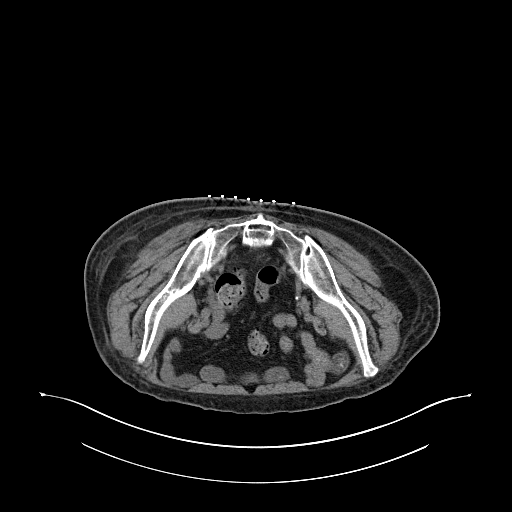
[im 9/30  lung]
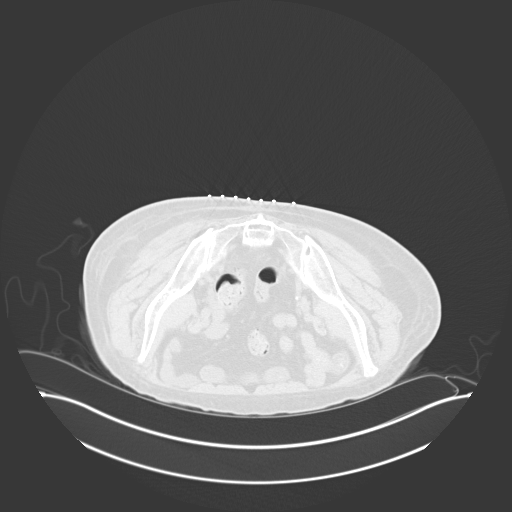
[im 11/30  lung]
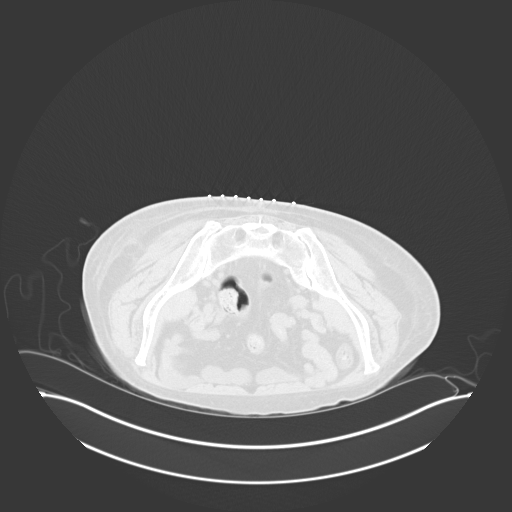
[im 13/30  lung]
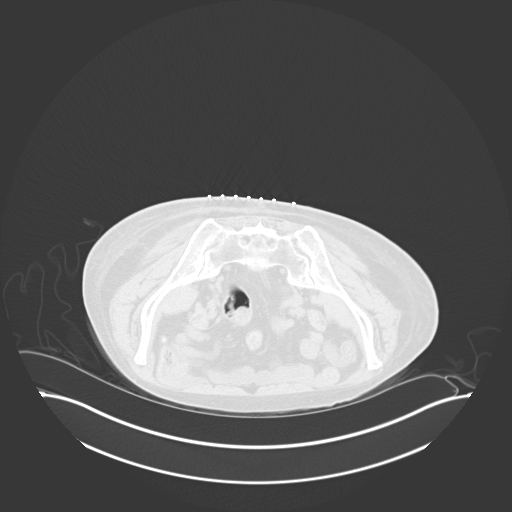
[im 15/30  lung]
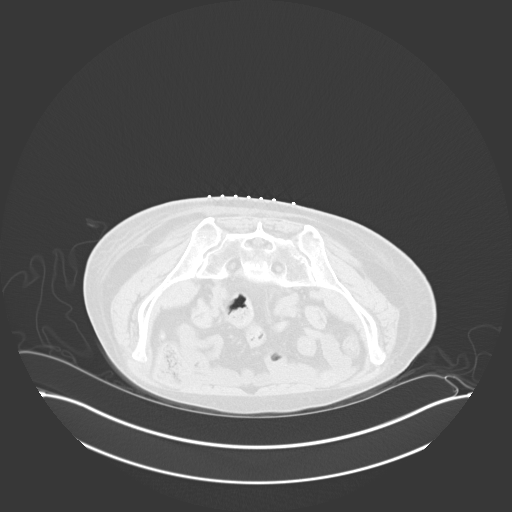
[im 17/30  mediastinal]
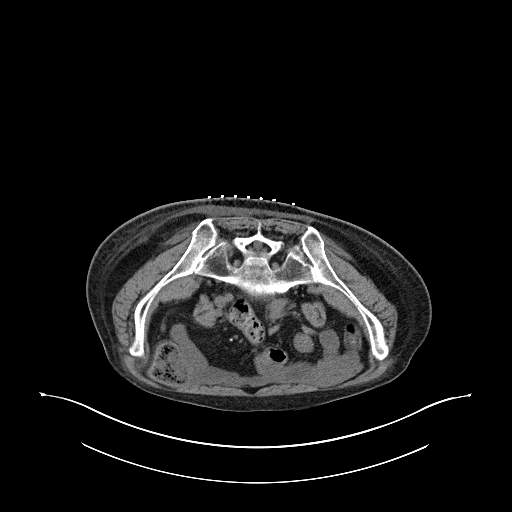
[im 17/30  lung]
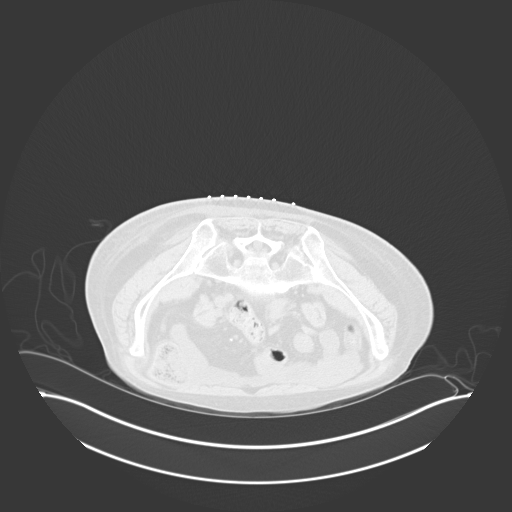
[im 19/30  lung]
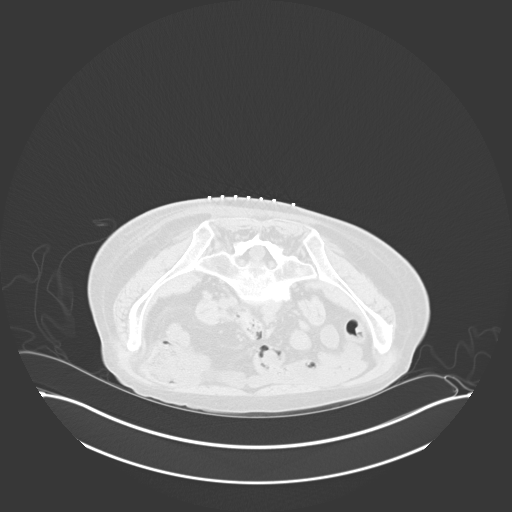
[im 21/30  lung]
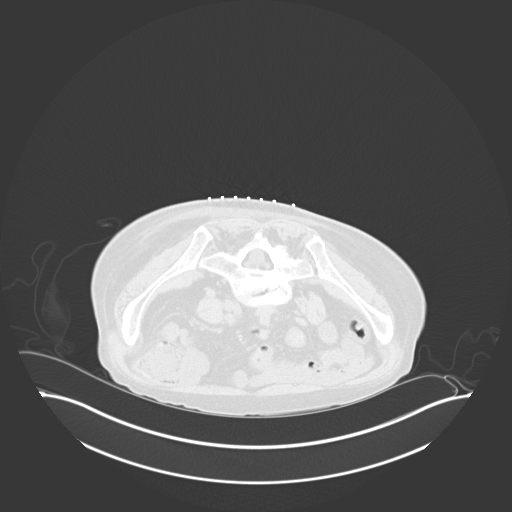
[im 22/30  lung]
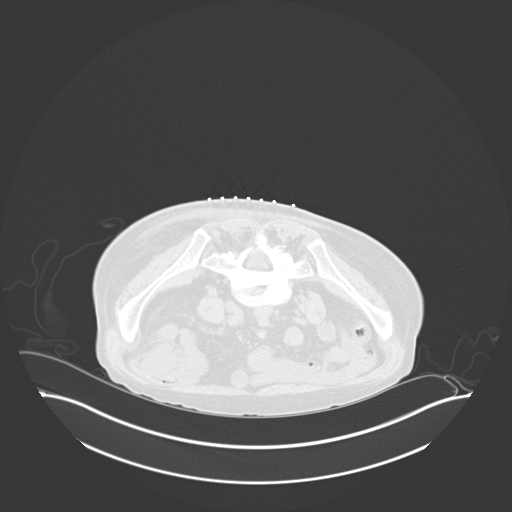
[im 25/30  mediastinal]
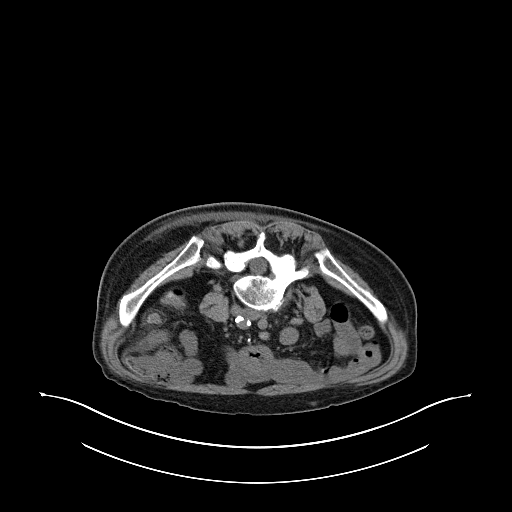
[im 25/30  lung]
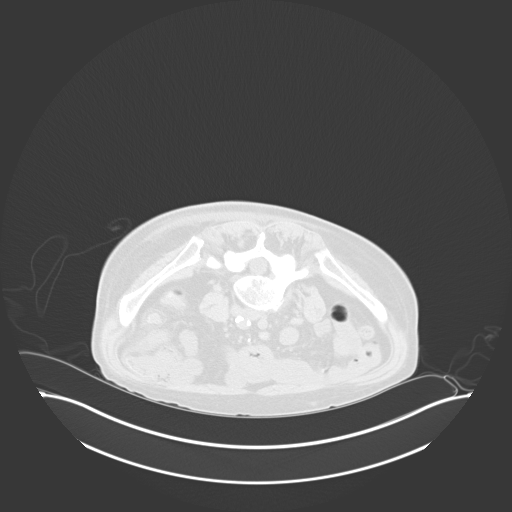
[im 26/30  lung]
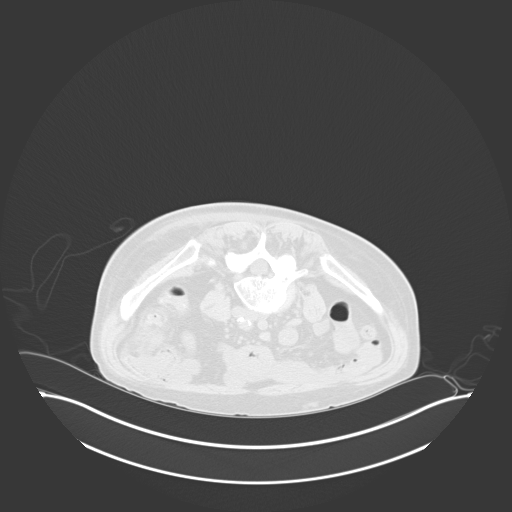
[im 28/30  lung]
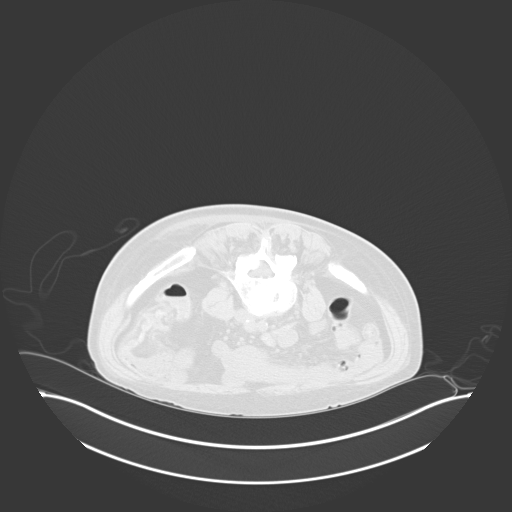

[15 of 28 positions shown; findings below may reference images not displayed]

EXAM:
CT BONE MARROW BIOPSY AND ASPIRATION; CT BIOPSY

MEDICATIONS:
None.

ANESTHESIA/SEDATION:
Moderate (conscious) sedation was employed during this procedure. A
total of Versed 2.0 mg and Fentanyl 100 mcg was administered
intravenously.

Moderate Sedation Time: 10 minutes. The patient's level of
consciousness and vital signs were monitored continuously by
radiology nursing throughout the procedure under my direct
supervision.

FLUOROSCOPY TIME:  CT

COMPLICATIONS:
None

PROCEDURE:
The procedure risks, benefits, and alternatives were explained to
the patient. Questions regarding the procedure were encouraged and
answered. The patient understands and consents to the procedure.

Scout CT of the pelvis was performed for surgical planning purposes.

The posterior right pelvis was prepped with Chlorhexidine in a
sterile fashion, and a sterile drape was applied covering the
operative field. A sterile gown and sterile gloves were used for the
procedure. Local anesthesia was provided with 1% Lidocaine.

Posterior right iliac bone was targeted for biopsy. The skin and
subcutaneous tissues were infiltrated with 1% lidocaine without
epinephrine. A small stab incision was made with an 11 blade
scalpel, and an 11 gauge Taj needle was advanced with CT guidance
to the posterior cortex. Manual forced was used to advance the
needle through the posterior cortex and the stylet was removed. A
bone marrow aspirate was retrieved and passed to a cytotechnologist
in the room. The Taj needle was then advanced without the stylet
for a core biopsy. The core biopsy was retrieved and also passed to
a cytotechnologist.

Manual pressure was used for hemostasis and a sterile dressing was
placed.

No complications were encountered no significant blood loss was
encountered.

Patient tolerated the procedure well and remained hemodynamically
stable throughout.
IMPRESSION: Status post CT-guided bone marrow biopsy, with tissue specimen sent
to pathology for complete histopathologic analysis

## 2019-10-15 ENCOUNTER — Telehealth: Payer: Self-pay | Admitting: Internal Medicine

## 2019-10-15 MED ORDER — CARVEDILOL 25 MG PO TABS: 12.5000 mg | ORAL_TABLET | Freq: Two times a day (BID) | ORAL | 11 refills | Status: DC

## 2019-10-15 NOTE — Telephone Encounter (Signed)
Spoke to patient, aware of recommendations.  aware to continue to monitor BP and call with any significant change.   Patient verbalized understanding.

## 2019-10-15 NOTE — Telephone Encounter (Signed)
Spoke to patient, patient states since her office visit with Dr. Debara Pickett 5/14 and her medications were changed she has been having low blood pressures and dizzy spells (valsartan 80 added, hydralazine stopped).   She states she also passed out on Saturday, this is the first time this has happened.  She was sitting at her computer chair, she stood up to let the dog inside and the next thing she remembers was waking up on the floor.  She felt fine before this happened (no CP, SOB, dizziness, palpitations, etc.).  She states she did not lose bowel or bladder control.  States she lost consciousness for ~3-5 mins.  Husband took her BP after and it was 98/55 HR 62.  She states she did hit her head on the floor but does not have a knot or any bruising.  Hit her elbow on the furniture and split it open.     She states she has had dizzy spells ever since the medication was changed.     Recent BP log: 06/26 at 0800 BP 98/55 Hr 62 6/27 at 0645 BP 105/63 HR 58 before medication 6/27 at 0925 BP 116/68 HR 55 after medication 6/28 at 0530 BP 127/75 HR 64 before medication 6/28 at 0730 BP 92/64 HR 54 after medication  AM meds: Lasix 20 Coreg 25 imdur 30 Valsartan 80 mg   Advised would send to Dr. Debara Pickett and pharmD for possible medication recommendations. Advised if she has another syncopal episode, proceed to ER for evaluation.  Patient verbalized understanding.

## 2019-10-15 NOTE — Telephone Encounter (Signed)
Pt c/o BP issue: STAT if pt c/o blurred vision, one-sided weakness or slurred speech  1. What are your last 5 BP readings?  06/25 @ 505 HR 64 06/26 @ 0800 BP 98/55 HR 62 06/27 @ 0645 BP105/63, HR @ 0645 58 06/27 @ 0925 BP 116/68, HR @ 0925 55 06/28 @ 0530 BP 127/75, HR @ 0530 64 06/28 @ 0730 BP 92/64, HR @ 0730 54  2. Are you having any other symptoms (ex. Dizziness, headache, blurred vision, passed out)? Patient states that after Dr. Debara Pickett changed her meds, she has been having dizzy spells. Passed out Saturday am...patient states she blacked out once she stood up.    3. What is your BP issue? Patient is wanting to know if there needs to be a change in meds. She is concerned because she has never blacked out before.

## 2019-10-15 NOTE — Telephone Encounter (Signed)
Ok ..  [employer]. Reduce coreg to 12.5 mg BID (1/2 tablet) - continue valsartan 80 mg daily. Monitor bp, should increase to 224-497 range systolic.  Dr Lemmie Evens

## 2019-10-24 ENCOUNTER — Telehealth: Payer: Self-pay | Admitting: Hematology & Oncology

## 2019-10-24 ENCOUNTER — Other Ambulatory Visit: Payer: Self-pay

## 2019-10-24 ENCOUNTER — Inpatient Hospital Stay: Payer: Medicare Other | Attending: Hematology & Oncology

## 2019-10-24 ENCOUNTER — Inpatient Hospital Stay (HOSPITAL_BASED_OUTPATIENT_CLINIC_OR_DEPARTMENT_OTHER): Payer: Medicare Other | Admitting: Family

## 2019-10-24 ENCOUNTER — Inpatient Hospital Stay: Payer: Medicare Other

## 2019-10-24 ENCOUNTER — Encounter: Payer: Self-pay | Admitting: Family

## 2019-10-24 VITALS — BP 139/69 | HR 66 | Temp 97.7°F | Resp 16 | Ht 64.0 in | Wt 121.7 lb

## 2019-10-24 VITALS — BP 114/51 | HR 66

## 2019-10-24 DIAGNOSIS — E611 Iron deficiency: Secondary | ICD-10-CM

## 2019-10-24 DIAGNOSIS — D631 Anemia in chronic kidney disease: Secondary | ICD-10-CM

## 2019-10-24 DIAGNOSIS — C880 Waldenstrom macroglobulinemia: Secondary | ICD-10-CM

## 2019-10-24 DIAGNOSIS — N1831 Chronic kidney disease, stage 3a: Secondary | ICD-10-CM

## 2019-10-24 DIAGNOSIS — Z5112 Encounter for antineoplastic immunotherapy: Secondary | ICD-10-CM | POA: Insufficient documentation

## 2019-10-24 DIAGNOSIS — Z79899 Other long term (current) drug therapy: Secondary | ICD-10-CM | POA: Insufficient documentation

## 2019-10-24 DIAGNOSIS — D649 Anemia, unspecified: Secondary | ICD-10-CM | POA: Insufficient documentation

## 2019-10-24 LAB — CMP (CANCER CENTER ONLY)
ALT: 26 U/L (ref 0–44)
AST: 19 U/L (ref 15–41)
Albumin: 4.5 g/dL (ref 3.5–5.0)
Alkaline Phosphatase: 61 U/L (ref 38–126)
Anion gap: 6 (ref 5–15)
BUN: 19 mg/dL (ref 8–23)
CO2: 27 mmol/L (ref 22–32)
Calcium: 9.7 mg/dL (ref 8.9–10.3)
Chloride: 106 mmol/L (ref 98–111)
Creatinine: 1.04 mg/dL — ABNORMAL HIGH (ref 0.44–1.00)
GFR, Est AFR Am: >60 mL/min (ref >60)
GFR, Estimated: 54 mL/min — ABNORMAL LOW (ref >60)
Glucose, Bld: 101 mg/dL — ABNORMAL HIGH (ref 70–99)
Potassium: 3.9 mmol/L (ref 3.5–5.1)
Sodium: 139 mmol/L (ref 135–145)
Total Bilirubin: 0.4 mg/dL (ref 0.3–1.2)
Total Protein: 6.1 g/dL — ABNORMAL LOW (ref 6.5–8.1)

## 2019-10-24 LAB — CBC WITH DIFFERENTIAL (CANCER CENTER ONLY)
Abs Immature Granulocytes: 0.09 10*3/uL — ABNORMAL HIGH (ref 0.00–0.07)
Basophils Absolute: 0 10*3/uL (ref 0.0–0.1)
Basophils Relative: 0 %
Eosinophils Absolute: 0.1 10*3/uL (ref 0.0–0.5)
Eosinophils Relative: 1 %
HCT: 35.5 % — ABNORMAL LOW (ref 36.0–46.0)
Hemoglobin: 11.9 g/dL — ABNORMAL LOW (ref 12.0–15.0)
Immature Granulocytes: 2 %
Lymphocytes Relative: 34 %
Lymphs Abs: 2 10*3/uL (ref 0.7–4.0)
MCH: 33.1 pg (ref 26.0–34.0)
MCHC: 33.5 g/dL (ref 30.0–36.0)
MCV: 98.9 fL (ref 80.0–100.0)
Monocytes Absolute: 0.4 10*3/uL (ref 0.1–1.0)
Monocytes Relative: 6 %
Neutro Abs: 3.5 10*3/uL (ref 1.7–7.7)
Neutrophils Relative %: 57 %
Platelet Count: 256 10*3/uL (ref 150–400)
RBC: 3.59 MIL/uL — ABNORMAL LOW (ref 3.87–5.11)
RDW: 13.2 % (ref 11.5–15.5)
WBC Count: 6 10*3/uL (ref 4.0–10.5)
nRBC: 0 % (ref 0.0–0.2)

## 2019-10-24 LAB — RETICULOCYTES
Immature Retic Fract: 12.3 % (ref 2.3–15.9)
RBC.: 3.56 MIL/uL — ABNORMAL LOW (ref 3.87–5.11)
Retic Count, Absolute: 66.2 10*3/uL (ref 19.0–186.0)
Retic Ct Pct: 1.9 % (ref 0.4–3.1)

## 2019-10-24 LAB — IRON AND TIBC
Iron: 119 ug/dL (ref 41–142)
Saturation Ratios: 41 % (ref 21–57)
TIBC: 290 ug/dL (ref 236–444)
UIBC: 171 ug/dL (ref 120–384)

## 2019-10-24 LAB — FERRITIN: Ferritin: 265 ng/mL (ref 11–307)

## 2019-10-24 MED ORDER — SODIUM CHLORIDE 0.9% FLUSH
10.0000 mL | INTRAVENOUS | Status: DC | PRN
  Administered 2019-10-24: 10 mL
  Filled 2019-10-24: qty 10

## 2019-10-24 MED ORDER — DIPHENHYDRAMINE HCL 25 MG PO CAPS
50.0000 mg | ORAL_CAPSULE | Freq: Once | ORAL | Status: AC
  Administered 2019-10-24: 50 mg via ORAL

## 2019-10-24 MED ORDER — ACETAMINOPHEN 325 MG PO TABS
ORAL_TABLET | ORAL | Status: AC
  Filled 2019-10-24: qty 2

## 2019-10-24 MED ORDER — HEPARIN SOD (PORK) LOCK FLUSH 100 UNIT/ML IV SOLN
500.0000 [IU] | Freq: Once | INTRAVENOUS | Status: AC | PRN
  Administered 2019-10-24: 500 [IU]
  Filled 2019-10-24: qty 5

## 2019-10-24 MED ORDER — DIPHENHYDRAMINE HCL 25 MG PO CAPS
ORAL_CAPSULE | ORAL | Status: AC
  Filled 2019-10-24: qty 2

## 2019-10-24 MED ORDER — ATORVASTATIN CALCIUM 40 MG PO TABS: 40.0000 mg | ORAL_TABLET | Freq: Every day | ORAL | 3 refills | Status: DC

## 2019-10-24 MED ORDER — ACETAMINOPHEN 325 MG PO TABS
650.0000 mg | ORAL_TABLET | Freq: Once | ORAL | Status: AC
  Administered 2019-10-24: 650 mg via ORAL

## 2019-10-24 MED ORDER — SODIUM CHLORIDE 0.9 % IV SOLN
375.0000 mg/m2 | Freq: Once | INTRAVENOUS | Status: AC
  Administered 2019-10-24: 600 mg via INTRAVENOUS
  Filled 2019-10-24: qty 50

## 2019-10-24 MED ORDER — SODIUM CHLORIDE 0.9 % IV SOLN
Freq: Once | INTRAVENOUS | Status: AC
  Filled 2019-10-24: qty 250

## 2019-10-24 NOTE — Patient Instructions (Signed)
Rituximab injection What is this medicine? RITUXIMAB (ri TUX i mab) is a monoclonal antibody. It is used to treat certain types of cancer like non-Hodgkin lymphoma and chronic lymphocytic leukemia. It is also used to treat rheumatoid arthritis, granulomatosis with polyangiitis (or Wegener's granulomatosis), microscopic polyangiitis, and pemphigus vulgaris. This medicine may be used for other purposes; ask your health care provider or pharmacist if you have questions. COMMON BRAND NAME(S): Rituxan, RUXIENCE What should I tell my health care provider before I take this medicine? They need to know if you have any of these conditions:  heart disease  infection (especially a virus infection such as hepatitis B, chickenpox, cold sores, or herpes)  immune system problems  irregular heartbeat  kidney disease  low blood counts, like low white cell, platelet, or red cell counts  lung or breathing disease, like asthma  recently received or scheduled to receive a vaccine  an unusual or allergic reaction to rituximab, other medicines, foods, dyes, or preservatives  pregnant or trying to get pregnant  breast-feeding How should I use this medicine? This medicine is for infusion into a vein. It is administered in a hospital or clinic by a specially trained health care professional. A special MedGuide will be given to you by the pharmacist with each prescription and refill. Be sure to read this information carefully each time. Talk to your pediatrician regarding the use of this medicine in children. This medicine is not approved for use in children. Overdosage: If you think you have taken too much of this medicine contact a poison control center or emergency room at once. NOTE: This medicine is only for you. Do not share this medicine with others. What if I miss a dose? It is important not to miss a dose. Call your doctor or health care professional if you are unable to keep an appointment. What  may interact with this medicine?  cisplatin  live virus vaccines This list may not describe all possible interactions. Give your health care provider a list of all the medicines, herbs, non-prescription drugs, or dietary supplements you use. Also tell them if you smoke, drink alcohol, or use illegal drugs. Some items may interact with your medicine. What should I watch for while using this medicine? Your condition will be monitored carefully while you are receiving this medicine. You may need blood work done while you are taking this medicine. This medicine can cause serious allergic reactions. To reduce your risk you may need to take medicine before treatment with this medicine. Take your medicine as directed. In some patients, this medicine may cause a serious brain infection that may cause death. If you have any problems seeing, thinking, speaking, walking, or standing, tell your healthcare professional right away. If you cannot reach your healthcare professional, urgently seek other source of medical care. Call your doctor or health care professional for advice if you get a fever, chills or sore throat, or other symptoms of a cold or flu. Do not treat yourself. This drug decreases your body's ability to fight infections. Try to avoid being around people who are sick. Do not become pregnant while taking this medicine or for at least 12 months after stopping it. Women should inform their doctor if they wish to become pregnant or think they might be pregnant. There is a potential for serious side effects to an unborn child. Talk to your health care professional or pharmacist for more information. Do not breast-feed an infant while taking this medicine or for at   least 6 months after stopping it. What side effects may I notice from receiving this medicine? Side effects that you should report to your doctor or health care professional as soon as possible:  allergic reactions like skin rash, itching or  hives; swelling of the face, lips, or tongue  breathing problems  chest pain  changes in vision  diarrhea  headache with fever, neck stiffness, sensitivity to light, nausea, or confusion  fast, irregular heartbeat  loss of memory  low blood counts - this medicine may decrease the number of white blood cells, red blood cells and platelets. You may be at increased risk for infections and bleeding.  mouth sores  problems with balance, talking, or walking  redness, blistering, peeling or loosening of the skin, including inside the mouth  signs of infection - fever or chills, cough, sore throat, pain or difficulty passing urine  signs and symptoms of kidney injury like trouble passing urine or change in the amount of urine  signs and symptoms of liver injury like dark yellow or brown urine; general ill feeling or flu-like symptoms; light-colored stools; loss of appetite; nausea; right upper belly pain; unusually weak or tired; yellowing of the eyes or skin  signs and symptoms of low blood pressure like dizziness; feeling faint or lightheaded, falls; unusually weak or tired  stomach pain  swelling of the ankles, feet, hands  unusual bleeding or bruising  vomiting Side effects that usually do not require medical attention (report to your doctor or health care professional if they continue or are bothersome):  headache  joint pain  muscle cramps or muscle pain  nausea  tiredness This list may not describe all possible side effects. Call your doctor for medical advice about side effects. You may report side effects to FDA at 1-800-FDA-1088. Where should I keep my medicine? This drug is given in a hospital or clinic and will not be stored at home. NOTE: This sheet is a summary. It may not cover all possible information. If you have questions about this medicine, talk to your doctor, pharmacist, or health care provider.  2020 [employer] Standard (2018-05-17  22:01:36)  

## 2019-10-24 NOTE — Patient Instructions (Signed)

## 2019-10-24 NOTE — Telephone Encounter (Signed)
Appointments scheduled calendar printed per 7/7 los °

## 2019-10-24 NOTE — Telephone Encounter (Signed)
Rx(s) sent to pharmacy electronically.  

## 2019-10-24 NOTE — Progress Notes (Signed)
Hematology and Oncology Follow Up Visit  Tiffany Leon [id number] 1950/08/17 69 y.o. 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 is here today for follow-up and treatment. She is doing well now but did have a syncopal episode with fall 2 weeks ago. She went to see her cardiologist and they switched her antihypertensives around. She is feeling much better and states that her energy has improved.  No M-spike observed in May, IgM level was 66 and IgG 197.   No fever, chills, n/v, cough, rash, dizziness, SOB, chest pain, palpitations, abdominal pain or changes in bowel or bladder habits.  No episodes of bleeding. No bruising or petechiae.  She has occasional numbness and tingling in her fingertips. This comes and goes but seems to be better since her medication change.  No swelling or tenderness in her extremities.  She is still sore in her back and left rib cage from her fall.  She has maintained a good appetite and is staying well hydrated. Her weight is stable.   ECOG Performance Status: 1 - Symptomatic but completely ambulatory  Medications:  Allergies as of 10/24/2019      Reactions   Codeine Nausea And Vomiting      Medication List       Accurate as of October 24, 2019  9:20 AM. If you have any questions, ask your nurse or doctor.        aspirin 81 MG chewable tablet Chew 1 tablet (81 mg total) by mouth daily.   atorvastatin 40 MG tablet Commonly known as: LIPITOR Take 1 tablet (40 mg total) by mouth daily at 6 PM.   carvedilol 25 MG tablet Commonly known as: COREG Take 0.5 tablets (12.5 mg total) by mouth 2 (two)  times daily with a meal.   clonazePAM 0.5 MG tablet Commonly known as: KLONOPIN Take 1 tablet (0.5 mg total) by mouth 2 (two) times daily.   famotidine 40 MG tablet Commonly known as: PEPCID Take 1 tablet (40 mg total) by mouth 2 (two) times daily.   fluconazole 100 MG tablet Commonly known as: DIFLUCAN Take 1 tablet (100 mg total) by mouth daily.   folic acid 1 MG tablet Commonly known as: FOLVITE Take 1 tablet (1 mg total) by mouth daily.   furosemide 40 MG tablet Commonly known as: LASIX Take 0.5 tablets (20 mg total) by mouth daily. Take 1/2 tablet (20 mg) by mouth in the morning and 1/2 tablet at lunch time.   levothyroxine 75 MCG tablet Commonly known as: SYNTHROID Take 75 mcg by mouth daily.   PARoxetine 40 MG tablet Commonly known as: PAXIL Take 1.5 tablets (60 mg total) by mouth daily.   potassium chloride 10 MEQ tablet Commonly known as: KLOR-CON Take 1 tablet (10 mEq total) by mouth daily.   predniSONE 10 MG tablet Commonly known as: DELTASONE Take 1 tablet (10 mg total) by mouth daily with breakfast.   prochlorperazine 10 MG tablet Commonly known as: COMPAZINE Take 1 tablet (10 mg total) by mouth every 6 (six) hours as needed (Nausea or vomiting).   traZODone 50 MG tablet Commonly known as: DESYREL Take 1-2 tablets (50-100 mg total) by mouth at bedtime.  valsartan 80 MG tablet Commonly known as: Diovan Take 1 tablet (80 mg total) by mouth daily.       Allergies:  Allergies  Allergen Reactions  . Codeine Nausea And Vomiting    Past Medical History, Surgical history, Social history, and Family History were reviewed and updated.  Review of Systems: All other 10 point review of systems is negative.   Physical Exam:  height is 5\' 4"  (1.626 m) and weight is 121 lb 11.2 oz (55.2 kg). Her oral temperature is 97.7 F (36.5 C). Her blood pressure is 139/69 and her pulse is 66. Her respiration is 16 and oxygen saturation is 98%.   Wt Readings from  Last 3 Encounters:  10/24/19 121 lb 11.2 oz (55.2 kg)  08/31/19 117 lb 6.4 oz (53.3 kg)  08/24/19 117 lb 1.9 oz (53.1 kg)    Ocular: Sclerae unicteric, pupils equal, round and reactive to light Ear-nose-throat: Oropharynx clear, dentition fair Lymphatic: No cervical or supraclavicular adenopathy Lungs no rales or rhonchi, good excursion bilaterally Heart regular rate and rhythm, no murmur appreciated Abd soft, nontender, positive bowel sounds, no liver or spleen tip palpated on exam, no fluid wave  MSK no focal spinal tenderness, no joint edema Neuro: non-focal, well-oriented, appropriate affect Breasts: Deferred   Lab Results  Component Value Date   WBC 6.0 10/24/2019   HGB 11.9 (L) 10/24/2019   HCT 35.5 (L) 10/24/2019   MCV 98.9 10/24/2019   PLT 256 10/24/2019   Lab Results  Component Value Date   FERRITIN 238 08/24/2019   IRON 72 08/24/2019   TIBC 280 08/24/2019   UIBC 209 08/24/2019   IRONPCTSAT 26 08/24/2019   Lab Results  Component Value Date   RETICCTPCT 1.9 10/24/2019   RBC 3.56 (L) 10/24/2019   Lab Results  Component Value Date   KPAFRELGTCHN 15.4 08/24/2019   LAMBDASER 13.0 08/24/2019   KAPLAMBRATIO 1.18 08/24/2019   Lab Results  Component Value Date   IGGSERUM 197 (L) 08/24/2019   IGA 49 (L) 08/24/2019   IGMSERUM 66 08/24/2019   Lab Results  Component Value Date   TOTALPROTELP 5.8 (L) 08/24/2019   ALBUMINELP 3.7 08/24/2019   A1GS 0.3 08/24/2019   A2GS 0.9 08/24/2019   BETS 0.7 08/24/2019   GAMS 0.3 (L) 08/24/2019   MSPIKE Not Observed 08/24/2019   SPEI Comment 09/23/2018     Chemistry      Component Value Date/Time   NA 141 08/24/2019 0851   K 3.9 08/24/2019 0851   CL 105 08/24/2019 0851   CO2 29 08/24/2019 0851   BUN 21 08/24/2019 0851   CREATININE 0.98 08/24/2019 0851      Component Value Date/Time   CALCIUM 9.6 08/24/2019 0851   ALKPHOS 65 08/24/2019 0851   AST 24 08/24/2019 0851   ALT 36 08/24/2019 0851   BILITOT 0.3 08/24/2019  0851       Impression and Plan: [name] is a very pleasant 69 yo caucasian female with Waldenstrom's macroglobulinemia. She was originally on Rituxan and Cytoxan but Cytoxan was stopped due to intolerance. She is doing well on maintenance Rituxan. She will complete in July 2022.  We will proceed with treatment today as planned.  No Retacrit needed for Hgb 11.9.  We will see her again in another 2 months.  She will contact our office with any questions or concerns. We can certainly see her sooner if needed.   Laverna Peace, NP 7/7/20219:20 AM

## 2019-10-25 LAB — IGG, IGA, IGM
IgA: 56 mg/dL — ABNORMAL LOW (ref 64–422)
IgG (Immunoglobin G), Serum: 237 mg/dL — ABNORMAL LOW (ref 586–1602)
IgM (Immunoglobulin M), Srm: 76 mg/dL (ref 26–217)

## 2019-10-25 LAB — PROTEIN ELECTROPHORESIS, SERUM
A/G Ratio: 2.1 — ABNORMAL HIGH (ref 0.7–1.7)
Albumin ELP: 4.1 g/dL (ref 2.9–4.4)
Alpha-1-Globulin: 0.2 g/dL (ref 0.0–0.4)
Alpha-2-Globulin: 0.9 g/dL (ref 0.4–1.0)
Beta Globulin: 0.7 g/dL (ref 0.7–1.3)
Gamma Globulin: 0.2 g/dL — ABNORMAL LOW (ref 0.4–1.8)
Globulin, Total: 2 g/dL — ABNORMAL LOW (ref 2.2–3.9)
Total Protein ELP: 6.1 g/dL (ref 6.0–8.5)

## 2019-10-25 LAB — KAPPA/LAMBDA LIGHT CHAINS
Kappa free light chain: 13.3 mg/L (ref 3.3–19.4)
Kappa, lambda light chain ratio: 0.87 (ref 0.26–1.65)
Lambda free light chains: 15.3 mg/L (ref 5.7–26.3)

## 2019-10-30 ENCOUNTER — Encounter: Payer: Self-pay | Admitting: Hematology & Oncology

## 2019-12-17 ENCOUNTER — Other Ambulatory Visit: Payer: Self-pay

## 2019-12-17 MED ORDER — CARVEDILOL 25 MG PO TABS: 12.5000 mg | ORAL_TABLET | Freq: Two times a day (BID) | ORAL | 9 refills | Status: DC

## 2019-12-19 ENCOUNTER — Other Ambulatory Visit: Payer: Self-pay

## 2019-12-24 IMAGING — CT CT BIOPSY AND ASPIRATION BONE MARROW
1 of 2 series · 15 of 29 positions shown, 19 images · non-contrast
Comparison: none

INDICATION: History of Waldenstrom's macroglobulinemia. Please perform CT-guided
bone marrow biopsy for tissue diagnostic purposes.

[Series 3: i-spiral 5.0 b40f · axial · 0.98mm/px · z∈[+1208,+1306]mm · 15 of 32 slices shown, 19 images]
[im 2/32  mediastinal]
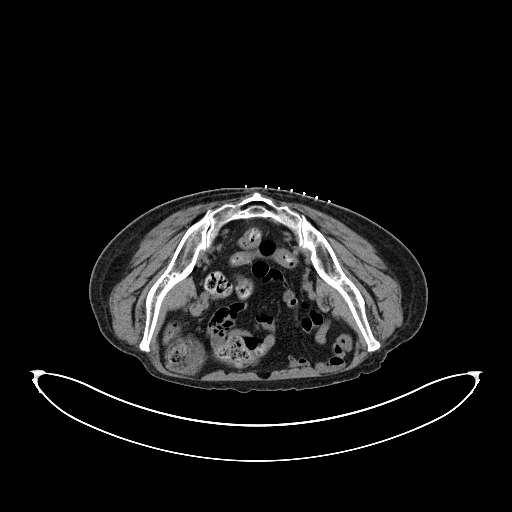
[im 2/32  lung]
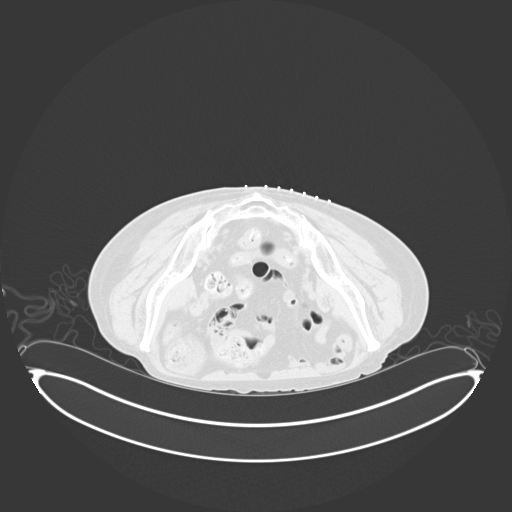
[im 5/32  lung]
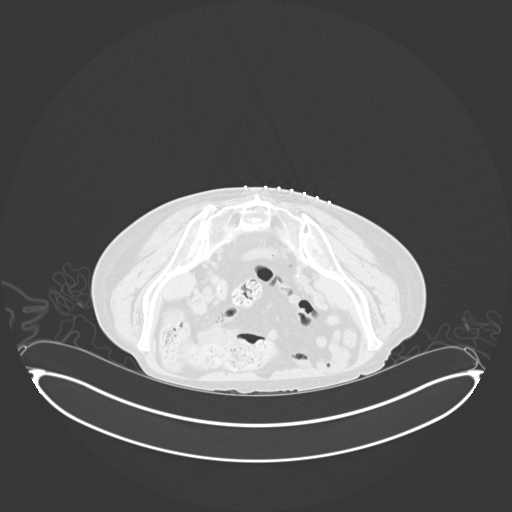
[im 6/32  lung]
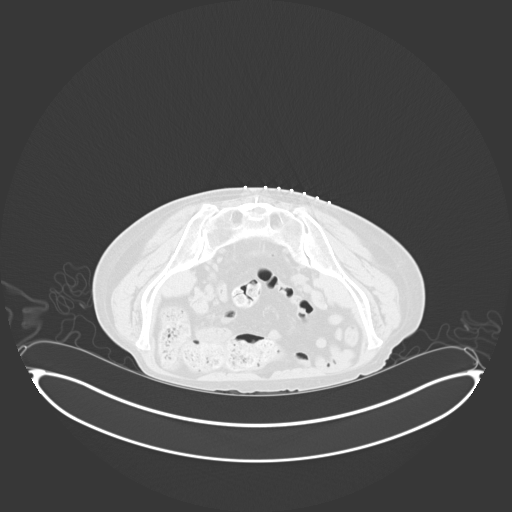
[im 8/32  lung]
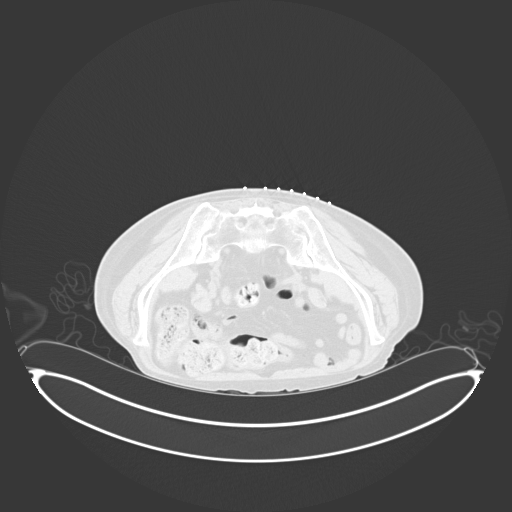
[im 11/32  mediastinal]
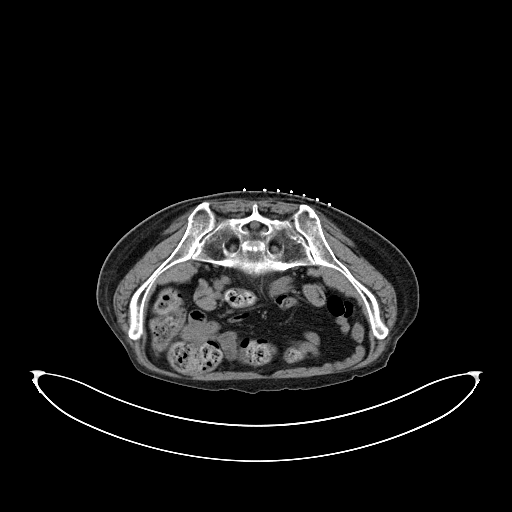
[im 11/32  lung]
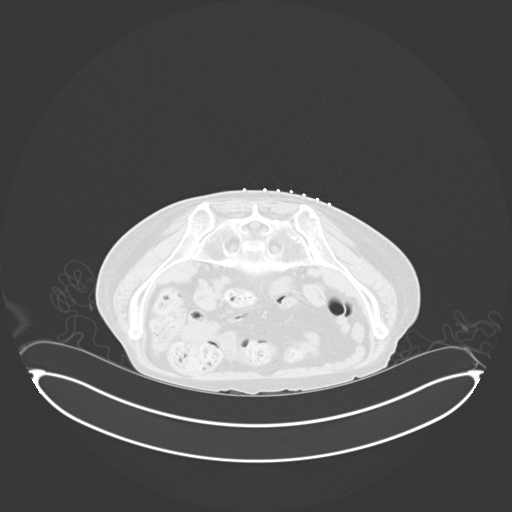
[im 12/32  lung]
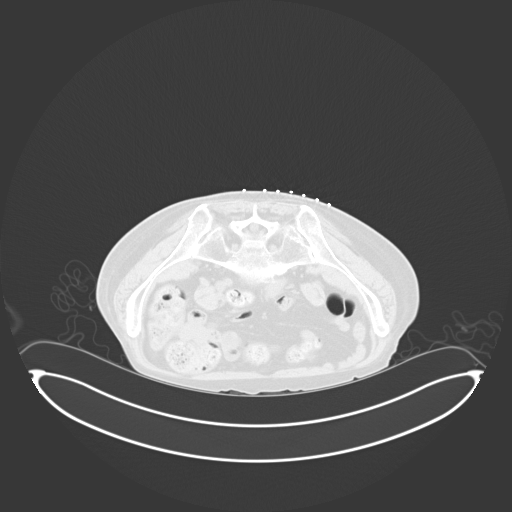
[im 15/32  lung]
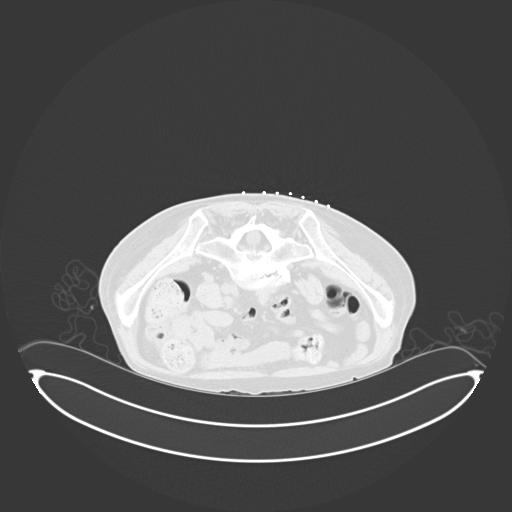
[im 16/32  lung]
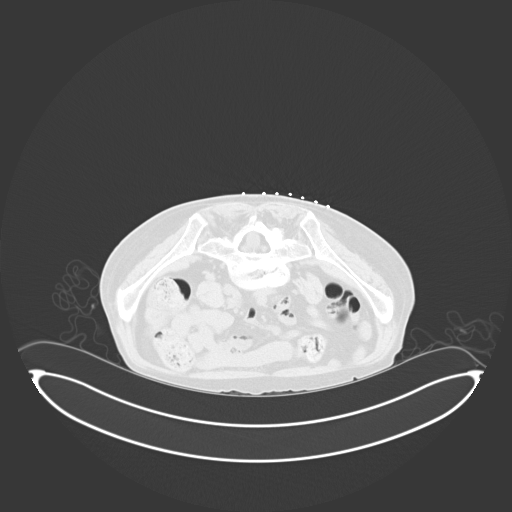
[im 17/32  mediastinal]
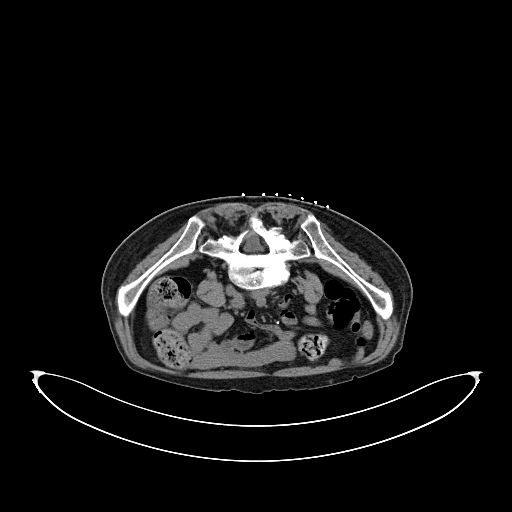
[im 17/32  lung]
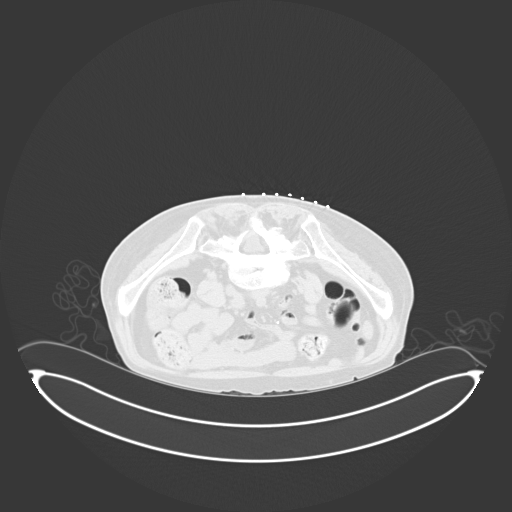
[im 20/32  lung]
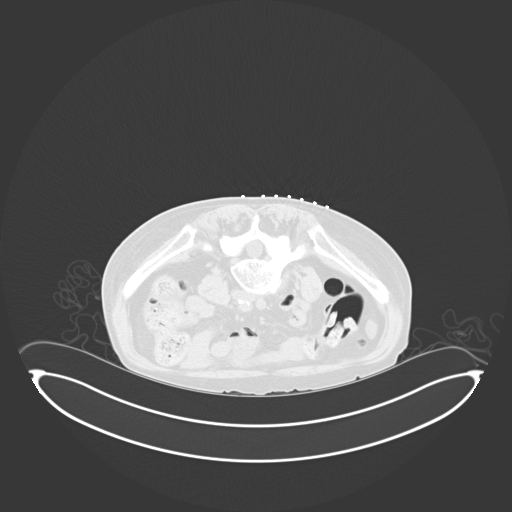
[im 21/32  lung]
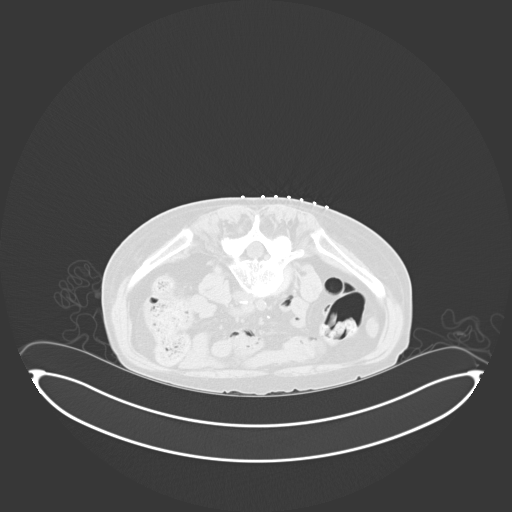
[im 24/32  lung]
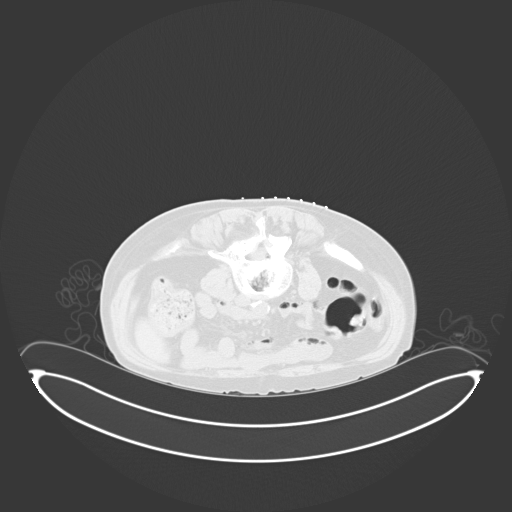
[im 26/32  mediastinal]
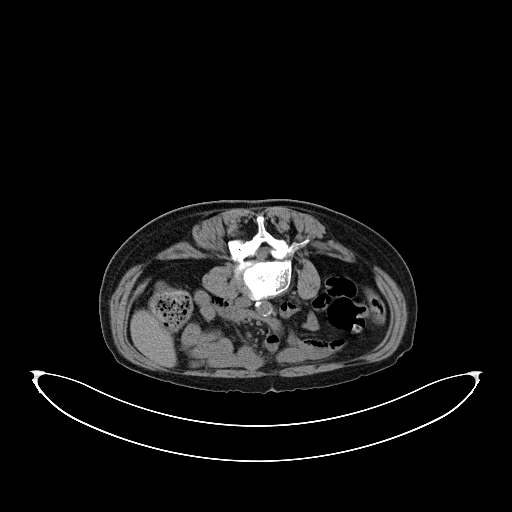
[im 26/32  lung]
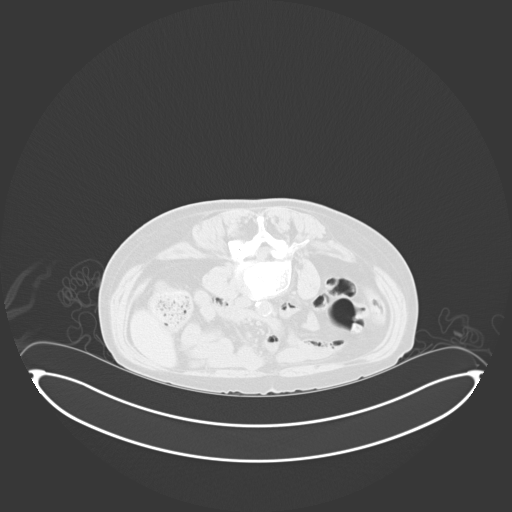
[im 27/32  lung]
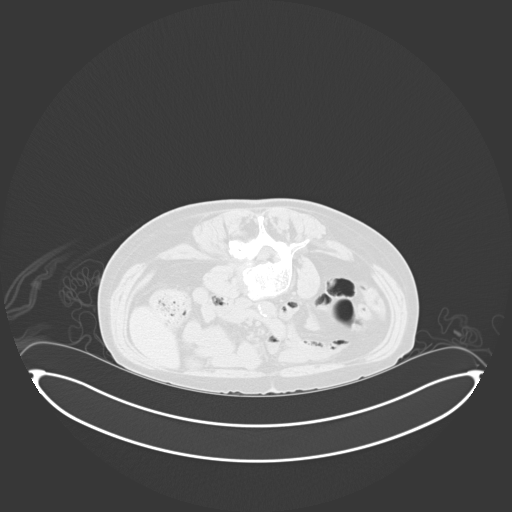
[im 30/32  lung]
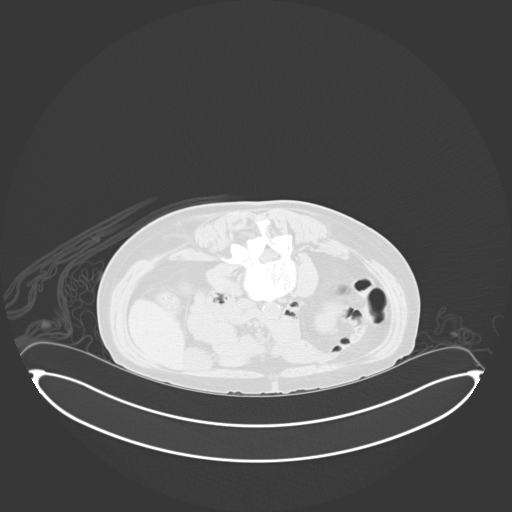

[15 of 29 positions shown; findings below may reference images not displayed]

EXAM:
CT-GUIDED BONE MARROW BIOPSY AND ASPIRATION

MEDICATIONS:
None

ANESTHESIA/SEDATION:
Fentanyl 100 mcg IV; Versed 2 mg IV

Sedation Time: 10 Minutes; The patient was continuously monitored
during the procedure by the interventional radiology nurse under my
direct supervision.

COMPLICATIONS:
None immediate.

PROCEDURE:
Informed consent was obtained from the patient following an
explanation of the procedure, risks, benefits and alternatives. The
patient understands, agrees and consents for the procedure. All
questions were addressed. A time out was performed prior to the
initiation of the procedure. The patient was positioned prone and
non-contrast localization CT was performed of the pelvis to
demonstrate the iliac marrow spaces. The operative site was prepped
and draped in the usual sterile fashion.

Under sterile conditions and local anesthesia, a 22 gauge spinal
needle was utilized for procedural planning. Next, an 11 gauge
coaxial bone biopsy needle was advanced into the left iliac marrow
space. Needle position was confirmed with CT imaging. Initially,
bone marrow aspiration was performed. Next, a bone marrow biopsy was
obtained with the 11 gauge outer bone marrow device. Samples were
prepared with the cytotechnologist and deemed adequate. The needle
was removed intact. Hemostasis was obtained with compression and a
dressing was placed. The patient tolerated the procedure well
without immediate post procedural complication.
IMPRESSION: Successful CT guided left iliac bone marrow aspiration and core
biopsy.

## 2019-12-25 ENCOUNTER — Inpatient Hospital Stay: Payer: Medicare Other

## 2019-12-25 ENCOUNTER — Inpatient Hospital Stay: Payer: Medicare Other | Attending: Hematology & Oncology

## 2019-12-25 ENCOUNTER — Other Ambulatory Visit: Payer: Self-pay

## 2019-12-25 ENCOUNTER — Encounter: Payer: Self-pay | Admitting: Hematology & Oncology

## 2019-12-25 ENCOUNTER — Inpatient Hospital Stay (HOSPITAL_BASED_OUTPATIENT_CLINIC_OR_DEPARTMENT_OTHER): Payer: Medicare Other | Admitting: Hematology & Oncology

## 2019-12-25 ENCOUNTER — Telehealth: Payer: Self-pay | Admitting: Hematology & Oncology

## 2019-12-25 VITALS — BP 110/67

## 2019-12-25 VITALS — BP 115/65 | HR 62 | Temp 98.2°F | Resp 17 | Wt 128.0 lb

## 2019-12-25 DIAGNOSIS — N189 Chronic kidney disease, unspecified: Secondary | ICD-10-CM | POA: Diagnosis not present

## 2019-12-25 DIAGNOSIS — D631 Anemia in chronic kidney disease: Secondary | ICD-10-CM

## 2019-12-25 DIAGNOSIS — Z5112 Encounter for antineoplastic immunotherapy: Secondary | ICD-10-CM | POA: Insufficient documentation

## 2019-12-25 DIAGNOSIS — E611 Iron deficiency: Secondary | ICD-10-CM

## 2019-12-25 DIAGNOSIS — Z79899 Other long term (current) drug therapy: Secondary | ICD-10-CM | POA: Insufficient documentation

## 2019-12-25 DIAGNOSIS — C880 Waldenstrom macroglobulinemia: Secondary | ICD-10-CM | POA: Insufficient documentation

## 2019-12-25 DIAGNOSIS — N1831 Chronic kidney disease, stage 3a: Secondary | ICD-10-CM

## 2019-12-25 LAB — RETICULOCYTES
Immature Retic Fract: 12.1 % (ref 2.3–15.9)
RBC.: 3.27 MIL/uL — ABNORMAL LOW (ref 3.87–5.11)
Retic Count, Absolute: 71 10*3/uL (ref 19.0–186.0)
Retic Ct Pct: 2.2 % (ref 0.4–3.1)

## 2019-12-25 LAB — CBC WITH DIFFERENTIAL (CANCER CENTER ONLY)
Abs Immature Granulocytes: 0.12 10*3/uL — ABNORMAL HIGH (ref 0.00–0.07)
Basophils Absolute: 0 10*3/uL (ref 0.0–0.1)
Basophils Relative: 0 %
Eosinophils Absolute: 0.1 10*3/uL (ref 0.0–0.5)
Eosinophils Relative: 1 %
HCT: 33.4 % — ABNORMAL LOW (ref 36.0–46.0)
Hemoglobin: 11.1 g/dL — ABNORMAL LOW (ref 12.0–15.0)
Immature Granulocytes: 2 %
Lymphocytes Relative: 31 %
Lymphs Abs: 1.7 10*3/uL (ref 0.7–4.0)
MCH: 33 pg (ref 26.0–34.0)
MCHC: 33.2 g/dL (ref 30.0–36.0)
MCV: 99.4 fL (ref 80.0–100.0)
Monocytes Absolute: 0.4 10*3/uL (ref 0.1–1.0)
Monocytes Relative: 7 %
Neutro Abs: 3.4 10*3/uL (ref 1.7–7.7)
Neutrophils Relative %: 59 %
Platelet Count: 231 10*3/uL (ref 150–400)
RBC: 3.36 MIL/uL — ABNORMAL LOW (ref 3.87–5.11)
RDW: 12.9 % (ref 11.5–15.5)
WBC Count: 5.7 10*3/uL (ref 4.0–10.5)
nRBC: 0 % (ref 0.0–0.2)

## 2019-12-25 LAB — CMP (CANCER CENTER ONLY)
ALT: 37 U/L (ref 0–44)
AST: 23 U/L (ref 15–41)
Albumin: 3.9 g/dL (ref 3.5–5.0)
Alkaline Phosphatase: 69 U/L (ref 38–126)
Anion gap: 6 (ref 5–15)
BUN: 19 mg/dL (ref 8–23)
CO2: 26 mmol/L (ref 22–32)
Calcium: 9.2 mg/dL (ref 8.9–10.3)
Chloride: 108 mmol/L (ref 98–111)
Creatinine: 1 mg/dL (ref 0.44–1.00)
GFR, Est AFR Am: >60 mL/min (ref >60)
GFR, Estimated: 57 mL/min — ABNORMAL LOW (ref >60)
Glucose, Bld: 90 mg/dL (ref 70–99)
Potassium: 3.8 mmol/L (ref 3.5–5.1)
Sodium: 140 mmol/L (ref 135–145)
Total Bilirubin: 0.4 mg/dL (ref 0.3–1.2)
Total Protein: 5.6 g/dL — ABNORMAL LOW (ref 6.5–8.1)

## 2019-12-25 LAB — IRON AND TIBC
Iron: 122 ug/dL (ref 41–142)
Saturation Ratios: 45 % (ref 21–57)
TIBC: 270 ug/dL (ref 236–444)
UIBC: 148 ug/dL (ref 120–384)

## 2019-12-25 LAB — FERRITIN: Ferritin: 261 ng/mL (ref 11–307)

## 2019-12-25 MED ORDER — SODIUM CHLORIDE 0.9 % IV SOLN
Freq: Once | INTRAVENOUS | Status: AC
  Filled 2019-12-25: qty 250

## 2019-12-25 MED ORDER — HEPARIN SOD (PORK) LOCK FLUSH 100 UNIT/ML IV SOLN
500.0000 [IU] | Freq: Once | INTRAVENOUS | Status: AC | PRN
  Administered 2019-12-25: 500 [IU]
  Filled 2019-12-25: qty 5

## 2019-12-25 MED ORDER — DIPHENHYDRAMINE HCL 25 MG PO CAPS
50.0000 mg | ORAL_CAPSULE | Freq: Once | ORAL | Status: AC
  Administered 2019-12-25: 50 mg via ORAL

## 2019-12-25 MED ORDER — DIPHENHYDRAMINE HCL 25 MG PO CAPS
ORAL_CAPSULE | ORAL | Status: AC
  Filled 2019-12-25: qty 2

## 2019-12-25 MED ORDER — SODIUM CHLORIDE 0.9% FLUSH
10.0000 mL | INTRAVENOUS | Status: DC | PRN
  Administered 2019-12-25: 10 mL
  Filled 2019-12-25: qty 10

## 2019-12-25 MED ORDER — SODIUM CHLORIDE 0.9 % IV SOLN
375.0000 mg/m2 | Freq: Once | INTRAVENOUS | Status: AC
  Administered 2019-12-25: 600 mg via INTRAVENOUS
  Filled 2019-12-25: qty 50

## 2019-12-25 MED ORDER — ACETAMINOPHEN 325 MG PO TABS
650.0000 mg | ORAL_TABLET | Freq: Once | ORAL | Status: AC
  Administered 2019-12-25: 650 mg via ORAL

## 2019-12-25 MED ORDER — ACETAMINOPHEN 325 MG PO TABS
ORAL_TABLET | ORAL | Status: AC
  Filled 2019-12-25: qty 2

## 2019-12-25 NOTE — Telephone Encounter (Signed)
Appointments scheduled calendar printed per 9/7 los 

## 2019-12-25 NOTE — Progress Notes (Signed)
Hematology and Oncology Follow Up Visit  Tiffany Leon [id number] 04/15/1951 69 y.o. 12/25/2019   Principle Diagnosis:  Waldenstrom's macroglobulinemia Anemia, chronic renal insufficiency, probable rheumatoid arthritis  Current Therapy:   Rituxan/Cytoxan-- start cycle #1 on 11/10/2018 -- cytoxan d/c'ed on 12/01/2018 Rituxan 375mg /m2 IV q 2 months -- maintanence Retacrit 40,000 units sq for Hgb < 11   Interim History:  Tiffany Leon is here today for follow-up.  She keeps improving.  Overall, she really has had no problems.  She and her husband have been doing some camping over the summertime.  She has had no problems with fever.  She has had her coronavirus vaccines.  There has been no issues with nausea or vomiting.  She is eating well.  She has had no change in bowel or bladder habits.  When we last saw her back in July, her M spike was not observed.  Her IgM level was 76 mg/dL.    She has had no rashes.  She has had no problems with mouth sores.  She had no headache.  She has had no bleeding.  Overall, her performance status is now ECOG 1.   Medications:  Allergies as of 12/25/2019      Reactions   Codeine Nausea And Vomiting      Medication List       Accurate as of December 25, 2019  8:43 AM. If you have any questions, ask your nurse or doctor.        STOP taking these medications   fluconazole 100 MG tablet Commonly known as: DIFLUCAN Stopped by: [name], MD   prochlorperazine 10 MG tablet Commonly known as: COMPAZINE Stopped by: [name], MD     TAKE these medications   aspirin 81 MG chewable tablet Chew 1 tablet (81 mg total) by mouth daily.   atorvastatin 40 MG tablet Commonly known as: LIPITOR Take 1 tablet (40 mg total) by mouth daily at 6 PM.   carvedilol 25 MG tablet Commonly known as: COREG Take 0.5 tablets (12.5 mg total) by mouth 2 (two) times daily with a meal.   clonazePAM 0.5 MG tablet Commonly known as: KLONOPIN Take 1 tablet  (0.5 mg total) by mouth 2 (two) times daily.   famotidine 40 MG tablet Commonly known as: PEPCID Take 1 tablet (40 mg total) by mouth 2 (two) times daily.   folic acid 1 MG tablet Commonly known as: FOLVITE Take 1 tablet (1 mg total) by mouth daily.   furosemide 40 MG tablet Commonly known as: LASIX Take 0.5 tablets (20 mg total) by mouth daily. Take 1/2 tablet (20 mg) by mouth in the morning and 1/2 tablet at lunch time.   levothyroxine 75 MCG tablet Commonly known as: SYNTHROID Take 75 mcg by mouth daily.   PARoxetine 40 MG tablet Commonly known as: PAXIL Take 1.5 tablets (60 mg total) by mouth daily.   potassium chloride 10 MEQ tablet Commonly known as: KLOR-CON Take 1 tablet (10 mEq total) by mouth daily.   predniSONE 10 MG tablet Commonly known as: DELTASONE Take 1 tablet (10 mg total) by mouth daily with breakfast.   traZODone 50 MG tablet Commonly known as: DESYREL Take 1-2 tablets (50-100 mg total) by mouth at bedtime.   valsartan 80 MG tablet Commonly known as: Diovan Take 1 tablet (80 mg total) by mouth daily.       Allergies:  Allergies  Allergen Reactions  . Codeine Nausea And Vomiting    Past Medical  History, Surgical history, Social history, and Family History were reviewed and updated.  Review of Systems: Review of Systems  Constitutional: Negative.   HENT: Negative.   Eyes: Negative.   Respiratory: Negative.   Cardiovascular: Negative.   Gastrointestinal: Negative.   Genitourinary: Negative.   Musculoskeletal: Positive for joint pain.  Skin: Negative.   Neurological: Negative.   Endo/Heme/Allergies: Negative.   Psychiatric/Behavioral: Negative.      Physical Exam:  weight is 128 lb (58.1 kg). Her oral temperature is 98.2 F (36.8 C). Her blood pressure is 115/65 and her pulse is 62. Her respiration is 17 and oxygen saturation is 100%.   Wt Readings from Last 3 Encounters:  12/25/19 128 lb (58.1 kg)  10/24/19 121 lb 11.2 oz (55.2  kg)  08/31/19 117 lb 6.4 oz (53.3 kg)    Physical Exam Vitals reviewed.  HENT:     Head: Normocephalic and atraumatic.  Eyes:     Pupils: Pupils are equal, round, and reactive to light.  Cardiovascular:     Rate and Rhythm: Normal rate and regular rhythm.     Heart sounds: Normal heart sounds.  Pulmonary:     Effort: Pulmonary effort is normal.     Breath sounds: Normal breath sounds.  Abdominal:     General: Bowel sounds are normal.     Palpations: Abdomen is soft.  Musculoskeletal:        General: No tenderness or deformity. Normal range of motion.     Cervical back: Normal range of motion.  Lymphadenopathy:     Cervical: No cervical adenopathy.  Skin:    General: Skin is warm and dry.     Findings: No erythema or rash.  Neurological:     Mental Status: She is alert and oriented to person, place, and time.  Psychiatric:        Behavior: Behavior normal.        Thought Content: Thought content normal.        Judgment: Judgment normal.      Lab Results  Component Value Date   WBC 5.7 12/25/2019   HGB 11.1 (L) 12/25/2019   HCT 33.4 (L) 12/25/2019   MCV 99.4 12/25/2019   PLT 231 12/25/2019   Lab Results  Component Value Date   FERRITIN 265 10/24/2019   IRON 119 10/24/2019   TIBC 290 10/24/2019   UIBC 171 10/24/2019   IRONPCTSAT 41 10/24/2019   Lab Results  Component Value Date   RETICCTPCT 2.2 12/25/2019   RBC 3.36 (L) 12/25/2019   RBC 3.27 (L) 12/25/2019   Lab Results  Component Value Date   KPAFRELGTCHN 13.3 10/24/2019   LAMBDASER 15.3 10/24/2019   KAPLAMBRATIO 0.87 10/24/2019   Lab Results  Component Value Date   IGGSERUM 237 (L) 10/24/2019   IGA 56 (L) 10/24/2019   IGMSERUM 76 10/24/2019   Lab Results  Component Value Date   TOTALPROTELP 6.1 10/24/2019   ALBUMINELP 4.1 10/24/2019   A1GS 0.2 10/24/2019   A2GS 0.9 10/24/2019   BETS 0.7 10/24/2019   GAMS 0.2 (L) 10/24/2019   MSPIKE Not Observed 10/24/2019   SPEI Comment 10/24/2019      Chemistry      Component Value Date/Time   NA 139 10/24/2019 0905   K 3.9 10/24/2019 0905   CL 106 10/24/2019 0905   CO2 27 10/24/2019 0905   BUN 19 10/24/2019 0905   CREATININE 1.04 (H) 10/24/2019 0905      Component Value Date/Time   CALCIUM  9.7 10/24/2019 0905   ALKPHOS 61 10/24/2019 0905   AST 19 10/24/2019 0905   ALT 26 10/24/2019 0905   BILITOT 0.4 10/24/2019 0905       Impression and Plan: Ms. Halvorson is a very pleasant 69 yo caucasian female with Waldenstrm's macroglobulinemia.  She has responded very well to Rituxan.  We initially had her on Rituxan with Cytoxan but she cannot tolerate the Cytoxan with her blood counts going down.  She is doing well on maintenance Rituxan.  Noted that her hemoglobin is trending downward a little bit.  She does not need any Procrit.  Her renal function is still doing nicely.  We will plan to see her back in 2 more months.   [name], MD 9/7/20218:43 AM

## 2019-12-25 NOTE — Patient Instructions (Signed)
Rituximab injection What is this medicine? RITUXIMAB (ri TUX i mab) is a monoclonal antibody. It is used to treat certain types of cancer like non-Hodgkin lymphoma and chronic lymphocytic leukemia. It is also used to treat rheumatoid arthritis, granulomatosis with polyangiitis (or Wegener's granulomatosis), microscopic polyangiitis, and pemphigus vulgaris. This medicine may be used for other purposes; ask your health care provider or pharmacist if you have questions. COMMON BRAND NAME(S): Rituxan, RUXIENCE What should I tell my health care provider before I take this medicine? They need to know if you have any of these conditions:  heart disease  infection (especially a virus infection such as hepatitis B, chickenpox, cold sores, or herpes)  immune system problems  irregular heartbeat  kidney disease  low blood counts, like low white cell, platelet, or red cell counts  lung or breathing disease, like asthma  recently received or scheduled to receive a vaccine  an unusual or allergic reaction to rituximab, other medicines, foods, dyes, or preservatives  pregnant or trying to get pregnant  breast-feeding How should I use this medicine? This medicine is for infusion into a vein. It is administered in a hospital or clinic by a specially trained health care professional. A special MedGuide will be given to you by the pharmacist with each prescription and refill. Be sure to read this information carefully each time. Talk to your pediatrician regarding the use of this medicine in children. This medicine is not approved for use in children. Overdosage: If you think you have taken too much of this medicine contact a poison control center or emergency room at once. NOTE: This medicine is only for you. Do not share this medicine with others. What if I miss a dose? It is important not to miss a dose. Call your doctor or health care professional if you are unable to keep an appointment. What  may interact with this medicine?  cisplatin  live virus vaccines This list may not describe all possible interactions. Give your health care provider a list of all the medicines, herbs, non-prescription drugs, or dietary supplements you use. Also tell them if you smoke, drink alcohol, or use illegal drugs. Some items may interact with your medicine. What should I watch for while using this medicine? Your condition will be monitored carefully while you are receiving this medicine. You may need blood work done while you are taking this medicine. This medicine can cause serious allergic reactions. To reduce your risk you may need to take medicine before treatment with this medicine. Take your medicine as directed. In some patients, this medicine may cause a serious brain infection that may cause death. If you have any problems seeing, thinking, speaking, walking, or standing, tell your healthcare professional right away. If you cannot reach your healthcare professional, urgently seek other source of medical care. Call your doctor or health care professional for advice if you get a fever, chills or sore throat, or other symptoms of a cold or flu. Do not treat yourself. This drug decreases your body's ability to fight infections. Try to avoid being around people who are sick. Do not become pregnant while taking this medicine or for at least 12 months after stopping it. Women should inform their doctor if they wish to become pregnant or think they might be pregnant. There is a potential for serious side effects to an unborn child. Talk to your health care professional or pharmacist for more information. Do not breast-feed an infant while taking this medicine or for at   least 6 months after stopping it. What side effects may I notice from receiving this medicine? Side effects that you should report to your doctor or health care professional as soon as possible:  allergic reactions like skin rash, itching or  hives; swelling of the face, lips, or tongue  breathing problems  chest pain  changes in vision  diarrhea  headache with fever, neck stiffness, sensitivity to light, nausea, or confusion  fast, irregular heartbeat  loss of memory  low blood counts - this medicine may decrease the number of white blood cells, red blood cells and platelets. You may be at increased risk for infections and bleeding.  mouth sores  problems with balance, talking, or walking  redness, blistering, peeling or loosening of the skin, including inside the mouth  signs of infection - fever or chills, cough, sore throat, pain or difficulty passing urine  signs and symptoms of kidney injury like trouble passing urine or change in the amount of urine  signs and symptoms of liver injury like dark yellow or brown urine; general ill feeling or flu-like symptoms; light-colored stools; loss of appetite; nausea; right upper belly pain; unusually weak or tired; yellowing of the eyes or skin  signs and symptoms of low blood pressure like dizziness; feeling faint or lightheaded, falls; unusually weak or tired  stomach pain  swelling of the ankles, feet, hands  unusual bleeding or bruising  vomiting Side effects that usually do not require medical attention (report to your doctor or health care professional if they continue or are bothersome):  headache  joint pain  muscle cramps or muscle pain  nausea  tiredness This list may not describe all possible side effects. Call your doctor for medical advice about side effects. You may report side effects to FDA at 1-800-FDA-1088. Where should I keep my medicine? This drug is given in a hospital or clinic and will not be stored at home. NOTE: This sheet is a summary. It may not cover all possible information. If you have questions about this medicine, talk to your doctor, pharmacist, or health care provider.  2020 [employer] Standard (2018-05-17  22:01:36)  

## 2019-12-26 LAB — KAPPA/LAMBDA LIGHT CHAINS
Kappa free light chain: 14.5 mg/L (ref 3.3–19.4)
Kappa, lambda light chain ratio: 1.39 (ref 0.26–1.65)
Lambda free light chains: 10.4 mg/L (ref 5.7–26.3)

## 2019-12-26 LAB — IGG, IGA, IGM
IgA: 63 mg/dL — ABNORMAL LOW (ref 64–422)
IgG (Immunoglobin G), Serum: 217 mg/dL — ABNORMAL LOW (ref 586–1602)
IgM (Immunoglobulin M), Srm: 73 mg/dL (ref 26–217)

## 2019-12-27 LAB — PROTEIN ELECTROPHORESIS, SERUM
A/G Ratio: 1.8 — ABNORMAL HIGH (ref 0.7–1.7)
Albumin ELP: 3.5 g/dL (ref 2.9–4.4)
Alpha-1-Globulin: 0.2 g/dL (ref 0.0–0.4)
Alpha-2-Globulin: 0.8 g/dL (ref 0.4–1.0)
Beta Globulin: 0.7 g/dL (ref 0.7–1.3)
Gamma Globulin: 0.2 g/dL — ABNORMAL LOW (ref 0.4–1.8)
Globulin, Total: 1.9 g/dL — ABNORMAL LOW (ref 2.2–3.9)
Total Protein ELP: 5.4 g/dL — ABNORMAL LOW (ref 6.0–8.5)

## 2020-01-30 ENCOUNTER — Ambulatory Visit: Payer: Medicare Other | Admitting: Psychiatry

## 2020-02-06 ENCOUNTER — Other Ambulatory Visit: Payer: Self-pay | Admitting: Hematology & Oncology

## 2020-02-14 ENCOUNTER — Other Ambulatory Visit: Payer: Self-pay

## 2020-02-14 ENCOUNTER — Ambulatory Visit (HOSPITAL_COMMUNITY): Payer: Medicare Other | Attending: Cardiovascular Disease

## 2020-02-14 DIAGNOSIS — I428 Other cardiomyopathies: Secondary | ICD-10-CM | POA: Insufficient documentation

## 2020-02-14 DIAGNOSIS — I5022 Chronic systolic (congestive) heart failure: Secondary | ICD-10-CM | POA: Diagnosis present

## 2020-02-14 LAB — ECHOCARDIOGRAM COMPLETE
Area-P 1/2: 2.66 cm2
S' Lateral: 2.9 cm

## 2020-02-20 ENCOUNTER — Other Ambulatory Visit: Payer: Self-pay | Admitting: Psychiatry

## 2020-02-20 ENCOUNTER — Encounter: Payer: Self-pay | Admitting: Psychiatry

## 2020-02-20 ENCOUNTER — Ambulatory Visit (INDEPENDENT_AMBULATORY_CARE_PROVIDER_SITE_OTHER): Payer: Medicare Other | Admitting: Psychiatry

## 2020-02-20 ENCOUNTER — Other Ambulatory Visit: Payer: Self-pay

## 2020-02-20 DIAGNOSIS — F331 Major depressive disorder, recurrent, moderate: Secondary | ICD-10-CM | POA: Diagnosis not present

## 2020-02-20 DIAGNOSIS — F4001 Agoraphobia with panic disorder: Secondary | ICD-10-CM

## 2020-02-20 DIAGNOSIS — F330 Major depressive disorder, recurrent, mild: Secondary | ICD-10-CM

## 2020-02-20 DIAGNOSIS — F5105 Insomnia due to other mental disorder: Secondary | ICD-10-CM

## 2020-02-20 MED ORDER — PAROXETINE HCL 40 MG PO TABS: 60.0000 mg | ORAL_TABLET | Freq: Every day | ORAL | 1 refills | Status: DC

## 2020-02-20 MED ORDER — ARIPIPRAZOLE 2 MG PO TABS: 2.0000 mg | ORAL_TABLET | Freq: Every day | ORAL | 1 refills | Status: DC

## 2020-02-20 MED ORDER — TRAZODONE HCL 50 MG PO TABS
50.0000 mg | ORAL_TABLET | Freq: Every day | ORAL | 1 refills | Status: DC
Start: 2020-02-20 — End: 2020-10-06

## 2020-02-20 MED ORDER — CLONAZEPAM 0.5 MG PO TABS: 0.5000 mg | ORAL_TABLET | Freq: Two times a day (BID) | ORAL | 1 refills | Status: DC

## 2020-02-20 NOTE — Progress Notes (Signed)
Tiffany Leon [id number] [birth year] 69 y.o.  Subjective:   Patient ID:  Tiffany Leon is a 69 y.o. (DOB 1950/12/24) female.  Chief Complaint:  Chief Complaint  Patient presents with  . Follow-up  . Depression  . Anxiety    Anxiety Symptoms include dizziness. Patient reports no confusion, decreased concentration, nervous/anxious behavior or suicidal ideas.    Depression        Associated symptoms include fatigue and appetite change.  Associated symptoms include no decreased concentration and no suicidal ideas.  Past medical history includes anxiety.    Tiffany Leon presents to the office today for follow-up of depression and anxiety.  Patient seen in January 2020.  She was experiencing some depression.  She had previously benefited by the addition of Wellbutrin XL 300 mg daily and that was restarted per her request.  Last seen September 2020.  The following was noted. Weaker on the left side in legs.   Dx Waldenstrom's macroglobunemia, anemia, CKD.  Dx about end of May. Started Wellbutrin after the last visit and it gave her death thoughts and insomnia and stopped.  Took it briefly.  Feels she's handling the stress OK.  Worry over htn which has been hard to control.  Tired and weak.  Losing weight.  Has PT.  Getting chemotx. She had mild anxiety and depression.  No meds were changed.  As of July 31, 2019 the following is noted: Cancer in remission.  Hopes last chemotx soon.  Cardiology May.  Getting stronger. Started driving again in March after a year.  Has GD with her 69 yo.   Overall doing pretty well.   No sig alcohol.  Has been excessive at times in the past. Plan no med changes  02/20/20 appt with following noted: No get up and go.  Generally down.  Nothing in particular.  Tendency to isolate and stay home both lack of motivation and anxiety.  No panic.  No change in sleep pattern.  Heart doing well.  FU cancer Friday and every 2 mos. Some napping an hour.  Tendency to seasonal  depression continues but is getting a little better.  Pt reports that mood is Anxious and Depressed and describes anxiety as Minimal. Anxiety symptoms include: Excessive Worry,.  No sig panic.   Pt reports no sleep issues and naps daily. Still needs trazodone.   Not as excessive as in the past.Appetite better anf regained weight to 129#.   Pt reports that energy is poor but retains some  interest or pleasure in usual activities, poor motivation and withdrawn from usual activities. Concentration is down slightly. Suicidal thoughts:  denied by patient.  Past Psychiatric Medication Trials: Wellbutrin SE, paroxetine, clonazepam, trazodone  Review of Systems:  Review of Systems  Constitutional: Positive for appetite change and fatigue.  Neurological: Positive for dizziness and weakness. Negative for tremors.  Psychiatric/Behavioral: Positive for depression and dysphoric mood. Negative for agitation, behavioral problems, confusion, decreased concentration, hallucinations, self-injury, sleep disturbance and suicidal ideas. The patient is not nervous/anxious and is not hyperactive.     Medications: I have reviewed the patient's current medications.  Current Outpatient Medications  Medication Sig Dispense Refill  . aspirin 81 MG chewable tablet Chew 1 tablet (81 mg total) by mouth daily.    Marland Kitchen atorvastatin (LIPITOR) 40 MG tablet Take 1 tablet (40 mg total) by mouth daily at 6 PM. 90 tablet 3  . carvedilol (COREG) 25 MG tablet Take 0.5 tablets (12.5 mg total) by mouth 2 (two) times daily  with a meal. 60 tablet 9  . clonazePAM (KLONOPIN) 0.5 MG tablet Take 1 tablet (0.5 mg total) by mouth 2 (two) times daily. 180 tablet 1  . famotidine (PEPCID) 40 MG tablet Take 1 tablet (40 mg total) by mouth 2 (two) times daily. 60 tablet 3  . folic acid (FOLVITE) 1 MG tablet Take 1 tablet (1 mg total) by mouth daily.    . furosemide (LASIX) 40 MG tablet Take 0.5 tablets (20 mg total) by mouth daily. Take 1/2 tablet (20  mg) by mouth in the morning and 1/2 tablet at lunch time. 90 tablet 1  . levothyroxine (SYNTHROID) 75 MCG tablet Take 75 mcg by mouth daily.    Marland Kitchen PARoxetine (PAXIL) 40 MG tablet Take 1.5 tablets (60 mg total) by mouth daily. 135 tablet 1  . potassium chloride (K-DUR) 10 MEQ tablet Take 1 tablet (10 mEq total) by mouth daily. 90 tablet 3  . predniSONE (DELTASONE) 10 MG tablet TAKE 1 TABLET BY MOUTH DAILY WITH BREAKFAST 30 tablet 3  . traZODone (DESYREL) 50 MG tablet Take 1-2 tablets (50-100 mg total) by mouth at bedtime. 180 tablet 1  . valsartan (DIOVAN) 80 MG tablet Take 1 tablet (80 mg total) by mouth daily. 90 tablet 3  . ARIPiprazole (ABILIFY) 2 MG tablet Take 1 tablet (2 mg total) by mouth daily. 30 tablet 1   No current facility-administered medications for this visit.    Medication Side Effects: None  Allergies:  Allergies  Allergen Reactions  . Codeine Nausea And Vomiting    Past Medical History:  Diagnosis Date  . Acute kidney injury ([place])    a. 09/2018  . Anxiety   . Aortic atherosclerosis ([place])    a. 09/2018 noted on Chest CT.  Marland Kitchen Chronic combined systolic (congestive) and diastolic (congestive) heart failure ([place])    a. 09/2018 Echo: EF 45-50%, diff HK. Triv to small circumfirential pericardial eff.  . Chronic DOE (dyspnea on exertion)   . Claudication [facility])    a. Bilat hip claudication since ~ 2019.  Marland Kitchen Depression   . Dyspnea   . Emphysema lung ([place])   . Exertional angina ([facility])    a. Ex angina since ~ 2019.  Marland Kitchen GERD (gastroesophageal reflux disease)   . Goals of care, counseling/discussion 11/03/2018  . Hiatal hernia   . History of bronchitis   . History of kidney stones   . Hypothyroidism   . Pleural effusion    a. 09/2018 s/p thoracentesis-->92ml.  . Pleural effusion 09/2018  . Rash 10/2018   Rash began Saturday with unknown reason.  MD seen Monday 10/23/18  . Resting tremor   . Tobacco abuse   . Waldenstrom's macroglobulinemia ([place]) 11/03/2018    Family  History  Problem Relation Age of Onset  . Stroke Mother        Mini-strokes. Died @ 3.  . Dementia Mother   . Lung cancer Father        Died in his 20's  . Addison's disease Sister   . Hypertension Brother   . CAD Brother   . Hypertension Brother     Social History   Socioeconomic History  . Marital status: Married    Spouse name: Not on file  . Number of children: Not on file  . Years of education: Not on file  . Highest education level: Not on file  Occupational History  . Not on file  Tobacco Use  . Smoking status: Current Every Day Smoker  Packs/day: 0.25    Years: 40.00    Pack years: 10.00    Types: Cigarettes  . Smokeless tobacco: Never Used  . Tobacco comment: smoking 3-4 cigarettes/day  Vaping Use  . Vaping Use: Never used  Substance and Sexual Activity  . Alcohol use: Not Currently    Comment: 1-2 gl wine / night - none since 07/2018  . Drug use: Not Currently    Comment: prev used drugs ~ 35 yrs ago.  Marland Kitchen Sexual activity: Not on file  Other Topics Concern  . Not on file  Social History Narrative   Lives in [place] with her husband.  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   Social Determinants of Health   Financial Resource Strain:   . Difficulty of Paying Living Expenses: Not on file  Food Insecurity:   . Worried About Charity fundraiser in the Last Year: Not on file  . Ran Out of Food in the Last Year: Not on file  Transportation Needs:   . Lack of Transportation (Medical): Not on file  . Lack of Transportation (Non-Medical): Not on file  Physical Activity:   . Days of Exercise per Week: Not on file  . Minutes of Exercise per Session: Not on file  Stress:   . Feeling of Stress : Not on file  Social Connections:   . Frequency of Communication with Friends and Family: Not on file  . Frequency of Social Gatherings with Friends and Family: Not on file  . Attends Religious Services: Not on file  . Active Member of Clubs or Organizations:  Not on file  . Attends Archivist Meetings: Not on file  . Marital Status: Not on file  Intimate Partner Violence:   . Fear of Current or Ex-Partner: Not on file  . Emotionally Abused: Not on file  . Physically Abused: Not on file  . Sexually Abused: Not on file    Past Medical History, Surgical history, Social history, and Family history were reviewed and updated as appropriate.   Please see review of systems for further details on the patient's review from today.   Objective:   Physical Exam:  There were no vitals taken for this visit.  Physical Exam Constitutional:      General: She is not in acute distress.    Appearance: She is well-developed. She is not ill-appearing.  Musculoskeletal:        General: No deformity.  Neurological:     Mental Status: She is alert and oriented to person, place, and time.     Motor: No tremor.     Coordination: Coordination normal.     Gait: Gait normal.  Psychiatric:        Attention and Perception: Attention normal. She is attentive. She does not perceive auditory hallucinations.        Mood and Affect: Mood is anxious and depressed. Affect is not labile, blunt, angry or inappropriate.        Speech: Speech normal.        Behavior: Behavior normal.        Thought Content: Thought content normal. Thought content does not include homicidal or suicidal ideation. Thought content does not include homicidal or suicidal plan.        Cognition and Memory: Cognition normal.        Judgment: Judgment normal.     Comments: Insight is good. ? Mild panic at night. More lethargic.     Lab Review:  Component Value Date/Time   NA 140 12/25/2019 0822   K 3.8 12/25/2019 0822   CL 108 12/25/2019 0822   CO2 26 12/25/2019 0822   GLUCOSE 90 12/25/2019 0822   BUN 19 12/25/2019 0822   CREATININE 1.00 12/25/2019 0822   CALCIUM 9.2 12/25/2019 0822   PROT 5.6 (L) 12/25/2019 0822   ALBUMIN 3.9 12/25/2019 0822   AST 23 12/25/2019 0822    ALT 37 12/25/2019 0822   ALKPHOS 69 12/25/2019 0822   BILITOT 0.4 12/25/2019 0822   GFRNONAA 57 (L) 12/25/2019 0822   GFRAA >60 12/25/2019 0822       Component Value Date/Time   WBC 5.7 12/25/2019 0822   WBC 3.1 (L) 01/23/2019 0723   RBC 3.36 (L) 12/25/2019 0822   RBC 3.27 (L) 12/25/2019 0822   HGB 11.1 (L) 12/25/2019 0822   HCT 33.4 (L) 12/25/2019 0822   PLT 231 12/25/2019 0822   MCV 99.4 12/25/2019 0822   MCH 33.0 12/25/2019 0822   MCHC 33.2 12/25/2019 0822   RDW 12.9 12/25/2019 0822   LYMPHSABS 1.7 12/25/2019 0822   MONOABS 0.4 12/25/2019 0822   EOSABS 0.1 12/25/2019 0822   BASOSABS 0.0 12/25/2019 0822   Normal TSH per PCP.  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  She is having mild symptoms of each.  She did not tolerate the Wellbutrin.   She does not want to start new medications because she is having to take a lot of medications at this point.  She is having physical therapy.  She is getting chemotherapy.  She is tolerating paroxetine and clonazepam well and feels like she needs full dose of both.  Option Abilify low dose, yes. Abilify 2 mg AM.  Disc fear of weight gain.    Encouraged social another types of stimulation to the extent that is possible..  Supportive therapy dealing with the cancer.  FU 2 mos  Lynder Parents, MD, DFAPA  Please see After Visit Summary for patient specific  instructions.  Future Appointments  Date Time Provider [place]  02/22/2020  7:45 AM [facility] LAB [facility] None  02/22/2020  8:00 AM [facility] INJ NURSE [facility] None  02/22/2020  8:15 AM Ennever, Rudell Cobb, MD [facility] None  02/22/2020  8:30 AM [facility] A1 [facility] None    No orders of the defined types were placed in this encounter.     -------------------------------

## 2020-02-22 ENCOUNTER — Inpatient Hospital Stay (HOSPITAL_BASED_OUTPATIENT_CLINIC_OR_DEPARTMENT_OTHER): Payer: Medicare Other | Admitting: Hematology & Oncology

## 2020-02-22 ENCOUNTER — Inpatient Hospital Stay: Payer: Medicare Other | Attending: Hematology & Oncology

## 2020-02-22 ENCOUNTER — Other Ambulatory Visit: Payer: Self-pay

## 2020-02-22 ENCOUNTER — Inpatient Hospital Stay: Payer: Medicare Other

## 2020-02-22 ENCOUNTER — Encounter: Payer: Self-pay | Admitting: Hematology & Oncology

## 2020-02-22 VITALS — BP 112/53 | HR 64 | Temp 97.9°F | Resp 17

## 2020-02-22 VITALS — BP 134/58 | HR 63 | Temp 98.7°F | Resp 18 | Wt 130.0 lb

## 2020-02-22 DIAGNOSIS — C880 Waldenstrom macroglobulinemia: Secondary | ICD-10-CM

## 2020-02-22 DIAGNOSIS — Z79899 Other long term (current) drug therapy: Secondary | ICD-10-CM | POA: Insufficient documentation

## 2020-02-22 DIAGNOSIS — Z5112 Encounter for antineoplastic immunotherapy: Secondary | ICD-10-CM | POA: Insufficient documentation

## 2020-02-22 LAB — CBC WITH DIFFERENTIAL (CANCER CENTER ONLY)
Abs Immature Granulocytes: 0.17 10*3/uL — ABNORMAL HIGH (ref 0.00–0.07)
Basophils Absolute: 0 10*3/uL (ref 0.0–0.1)
Basophils Relative: 0 %
Eosinophils Absolute: 0.1 10*3/uL (ref 0.0–0.5)
Eosinophils Relative: 1 %
HCT: 35.5 % — ABNORMAL LOW (ref 36.0–46.0)
Hemoglobin: 12.1 g/dL (ref 12.0–15.0)
Immature Granulocytes: 3 %
Lymphocytes Relative: 30 %
Lymphs Abs: 1.8 10*3/uL (ref 0.7–4.0)
MCH: 33.5 pg (ref 26.0–34.0)
MCHC: 34.1 g/dL (ref 30.0–36.0)
MCV: 98.3 fL (ref 80.0–100.0)
Monocytes Absolute: 0.6 10*3/uL (ref 0.1–1.0)
Monocytes Relative: 9 %
Neutro Abs: 3.5 10*3/uL (ref 1.7–7.7)
Neutrophils Relative %: 57 %
Platelet Count: 258 10*3/uL (ref 150–400)
RBC: 3.61 MIL/uL — ABNORMAL LOW (ref 3.87–5.11)
RDW: 12.8 % (ref 11.5–15.5)
WBC Count: 6.1 10*3/uL (ref 4.0–10.5)
nRBC: 0 % (ref 0.0–0.2)

## 2020-02-22 LAB — CMP (CANCER CENTER ONLY)
ALT: 34 U/L (ref 0–44)
AST: 23 U/L (ref 15–41)
Albumin: 4.4 g/dL (ref 3.5–5.0)
Alkaline Phosphatase: 59 U/L (ref 38–126)
Anion gap: 7 (ref 5–15)
BUN: 24 mg/dL — ABNORMAL HIGH (ref 8–23)
CO2: 29 mmol/L (ref 22–32)
Calcium: 9.7 mg/dL (ref 8.9–10.3)
Chloride: 103 mmol/L (ref 98–111)
Creatinine: 1.05 mg/dL — ABNORMAL HIGH (ref 0.44–1.00)
GFR, Estimated: 57 mL/min — ABNORMAL LOW (ref >60)
Glucose, Bld: 90 mg/dL (ref 70–99)
Potassium: 3.8 mmol/L (ref 3.5–5.1)
Sodium: 139 mmol/L (ref 135–145)
Total Bilirubin: 0.5 mg/dL (ref 0.3–1.2)
Total Protein: 6.1 g/dL — ABNORMAL LOW (ref 6.5–8.1)

## 2020-02-22 LAB — LACTATE DEHYDROGENASE: LDH: 136 U/L (ref 98–192)

## 2020-02-22 MED ORDER — ACETAMINOPHEN 325 MG PO TABS
ORAL_TABLET | ORAL | Status: AC
  Filled 2020-02-22: qty 2

## 2020-02-22 MED ORDER — DIPHENHYDRAMINE HCL 25 MG PO CAPS
50.0000 mg | ORAL_CAPSULE | Freq: Once | ORAL | Status: AC
  Administered 2020-02-22: 50 mg via ORAL

## 2020-02-22 MED ORDER — SODIUM CHLORIDE 0.9 % IV SOLN
Freq: Once | INTRAVENOUS | Status: AC
  Filled 2020-02-22: qty 250

## 2020-02-22 MED ORDER — SODIUM CHLORIDE 0.9% FLUSH
10.0000 mL | INTRAVENOUS | Status: DC | PRN
  Filled 2020-02-22: qty 10

## 2020-02-22 MED ORDER — SODIUM CHLORIDE 0.9 % IV SOLN
375.0000 mg/m2 | Freq: Once | INTRAVENOUS | Status: AC
  Administered 2020-02-22: 600 mg via INTRAVENOUS
  Filled 2020-02-22: qty 10

## 2020-02-22 MED ORDER — DIPHENHYDRAMINE HCL 25 MG PO CAPS
ORAL_CAPSULE | ORAL | Status: AC
  Filled 2020-02-22: qty 2

## 2020-02-22 MED ORDER — HEPARIN SOD (PORK) LOCK FLUSH 100 UNIT/ML IV SOLN
500.0000 [IU] | Freq: Once | INTRAVENOUS | Status: DC | PRN
  Filled 2020-02-22: qty 5

## 2020-02-22 MED ORDER — ACETAMINOPHEN 325 MG PO TABS
650.0000 mg | ORAL_TABLET | Freq: Once | ORAL | Status: AC
  Administered 2020-02-22: 650 mg via ORAL

## 2020-02-22 NOTE — Progress Notes (Signed)
OK to proceed with pre meds prior to today's CMET results per order of Dr. Marin Olp.

## 2020-02-22 NOTE — Progress Notes (Signed)
Hematology and Oncology Follow Up Visit  An Lannan [id number] August 13, 1950 69 y.o. 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 Friese is here today for follow-up.  I am very impressed with how well she looks.  She looks wonderful.  She feels good.  She has had no problems since we last saw her in September.  She is getting ready for a busy holiday season.  When we last saw her in September, her monoclonal spike was not observed.  Her IgM level was 73 mg/dL.  She has had no problem with infections.  She has had her coronavirus vaccines.  There is been no issues with nausea or vomiting.  She has had no bleeding.  She is urinating well.  She has had no leg swelling.  Her arthritis has not been too bad.  Overall, I would say performance status is ECOG 1.    Medications:  Allergies as of 02/22/2020      Reactions   Codeine Nausea And Vomiting      Medication List       Accurate as of February 22, 2020  8:08 AM. If you have any questions, ask your nurse or doctor.        ARIPiprazole 2 MG tablet Commonly known as: Abilify Take 1 tablet (2 mg total) by mouth daily.   aspirin 81 MG chewable tablet Chew 1 tablet (81 mg total) by mouth daily.   atorvastatin 40 MG tablet Commonly known as: LIPITOR Take 1 tablet (40 mg total) by mouth daily at 6 PM.   carvedilol 25 MG tablet Commonly known as: COREG Take 0.5 tablets (12.5 mg total) by mouth 2 (two) times daily with a meal.   clonazePAM 0.5 MG tablet Commonly known as: KLONOPIN Take 1 tablet (0.5 mg total) by mouth 2 (two) times daily.   famotidine 40 MG tablet Commonly known as: PEPCID Take 1 tablet (40 mg total) by mouth 2  (two) times daily.   folic acid 1 MG tablet Commonly known as: FOLVITE Take 1 tablet (1 mg total) by mouth daily.   furosemide 40 MG tablet Commonly known as: LASIX Take 0.5 tablets (20 mg total) by mouth daily. Take 1/2 tablet (20 mg) by mouth in the morning and 1/2 tablet at lunch time.   levothyroxine 75 MCG tablet Commonly known as: SYNTHROID Take 75 mcg by mouth daily.   PARoxetine 40 MG tablet Commonly known as: PAXIL Take 1.5 tablets (60 mg total) by mouth daily.   potassium chloride 10 MEQ tablet Commonly known as: KLOR-CON Take 1 tablet (10 mEq total) by mouth daily.   predniSONE 10 MG tablet Commonly known as: DELTASONE TAKE 1 TABLET BY MOUTH DAILY WITH BREAKFAST   traZODone 50 MG tablet Commonly known as: DESYREL Take 1-2 tablets (50-100 mg total) by mouth at bedtime.   valsartan 80 MG tablet Commonly known as: Diovan Take 1 tablet (80 mg total) by mouth daily.       Allergies:  Allergies  Allergen Reactions  . Codeine Nausea And Vomiting    Past Medical History, Surgical history, Social history, and Family History were reviewed and updated.  Review of Systems: Review of Systems  Constitutional: Negative.   HENT: Negative.   Eyes: Negative.  Respiratory: Negative.   Cardiovascular: Negative.   Gastrointestinal: Negative.   Genitourinary: Negative.   Musculoskeletal: Positive for joint pain.  Skin: Negative.   Neurological: Negative.   Endo/Heme/Allergies: Negative.   Psychiatric/Behavioral: Negative.      Physical Exam:  vitals were not taken for this visit.   Wt Readings from Last 3 Encounters:  12/25/19 128 lb (58.1 kg)  10/24/19 121 lb 11.2 oz (55.2 kg)  08/31/19 117 lb 6.4 oz (53.3 kg)    Physical Exam Vitals reviewed.  HENT:     Head: Normocephalic and atraumatic.  Eyes:     Pupils: Pupils are equal, round, and reactive to light.  Cardiovascular:     Rate and Rhythm: Normal rate and regular rhythm.     Heart sounds:  Normal heart sounds.  Pulmonary:     Effort: Pulmonary effort is normal.     Breath sounds: Normal breath sounds.  Abdominal:     General: Bowel sounds are normal.     Palpations: Abdomen is soft.  Musculoskeletal:        General: No tenderness or deformity. Normal range of motion.     Cervical back: Normal range of motion.  Lymphadenopathy:     Cervical: No cervical adenopathy.  Skin:    General: Skin is warm and dry.     Findings: No erythema or rash.  Neurological:     Mental Status: She is alert and oriented to person, place, and time.  Psychiatric:        Behavior: Behavior normal.        Thought Content: Thought content normal.        Judgment: Judgment normal.      Lab Results  Component Value Date   WBC 6.1 02/22/2020   HGB 12.1 02/22/2020   HCT 35.5 (L) 02/22/2020   MCV 98.3 02/22/2020   PLT 258 02/22/2020   Lab Results  Component Value Date   FERRITIN 261 12/25/2019   IRON 122 12/25/2019   TIBC 270 12/25/2019   UIBC 148 12/25/2019   IRONPCTSAT 45 12/25/2019   Lab Results  Component Value Date   RETICCTPCT 2.2 12/25/2019   RBC 3.61 (L) 02/22/2020   Lab Results  Component Value Date   KPAFRELGTCHN 14.5 12/25/2019   LAMBDASER 10.4 12/25/2019   KAPLAMBRATIO 1.39 12/25/2019   Lab Results  Component Value Date   IGGSERUM 217 (L) 12/25/2019   IGA 63 (L) 12/25/2019   IGMSERUM 73 12/25/2019   Lab Results  Component Value Date   TOTALPROTELP 5.4 (L) 12/25/2019   ALBUMINELP 3.5 12/25/2019   A1GS 0.2 12/25/2019   A2GS 0.8 12/25/2019   BETS 0.7 12/25/2019   GAMS 0.2 (L) 12/25/2019   MSPIKE Not Observed 12/25/2019   SPEI Comment 12/25/2019     Chemistry      Component Value Date/Time   NA 140 12/25/2019 0822   K 3.8 12/25/2019 0822   CL 108 12/25/2019 0822   CO2 26 12/25/2019 0822   BUN 19 12/25/2019 0822   CREATININE 1.00 12/25/2019 0822      Component Value Date/Time   CALCIUM 9.2 12/25/2019 0822   ALKPHOS 69 12/25/2019 0822   AST 23  12/25/2019 0822   ALT 37 12/25/2019 0822   BILITOT 0.4 12/25/2019 0822       Impression and Plan: Ms. Barretto is a very pleasant 69 yo caucasian female with Waldenstrm's macroglobulinemia.  She has responded very well to Rituxan.  We initially had her on Rituxan with  Cytoxan but she cannot tolerate the Cytoxan with her blood counts going down.  She is doing well on maintenance Rituxan.  We will go ahead and continue her on Rituxan.  She will finished Rituxan in August.  Her hemoglobin is doing quite well.  She has not required any ESA for quite a while.  I am just glad that her quality of life is where she likes it to be.  We will now get her back the end of January of 2022.    [name], MD 11/5/20218:08 AM

## 2020-02-22 NOTE — Patient Instructions (Signed)
[facility] Discharge Instructions for Patients Receiving Chemotherapy  Today you received the following chemotherapy agents Rituxan  To help prevent nausea and vomiting after your treatment, we encourage you to take your nausea medication as prescribed by MD.   If you develop nausea and vomiting that is not controlled by your nausea medication, call the clinic.   BELOW ARE SYMPTOMS THAT SHOULD BE REPORTED IMMEDIATELY:  *FEVER GREATER THAN 100.5 F  *CHILLS WITH OR WITHOUT FEVER  NAUSEA AND VOMITING THAT IS NOT CONTROLLED WITH YOUR NAUSEA MEDICATION  *UNUSUAL SHORTNESS OF BREATH  *UNUSUAL BRUISING OR BLEEDING  TENDERNESS IN MOUTH AND THROAT WITH OR WITHOUT PRESENCE OF ULCERS  *URINARY PROBLEMS  *BOWEL PROBLEMS  UNUSUAL RASH Items with * indicate a potential emergency and should be followed up as soon as possible.  Feel free to call the clinic should you have any questions or concerns. The clinic phone number is [phone].  Please show the CHEMO ALERT CARD at check-in to the Emergency Department and triage nurse.  

## 2020-02-23 LAB — IGG, IGA, IGM
IgA: 60 mg/dL — ABNORMAL LOW (ref 64–422)
IgG (Immunoglobin G), Serum: 247 mg/dL — ABNORMAL LOW (ref 586–1602)
IgM (Immunoglobulin M), Srm: 75 mg/dL (ref 26–217)

## 2020-02-25 LAB — PROTEIN ELECTROPHORESIS, SERUM, WITH REFLEX
A/G Ratio: 1.7 (ref 0.7–1.7)
Albumin ELP: 3.8 g/dL (ref 2.9–4.4)
Alpha-1-Globulin: 0.2 g/dL (ref 0.0–0.4)
Alpha-2-Globulin: 0.9 g/dL (ref 0.4–1.0)
Beta Globulin: 0.8 g/dL (ref 0.7–1.3)
Gamma Globulin: 0.3 g/dL — ABNORMAL LOW (ref 0.4–1.8)
Globulin, Total: 2.2 g/dL (ref 2.2–3.9)
Total Protein ELP: 6 g/dL (ref 6.0–8.5)

## 2020-02-25 LAB — KAPPA/LAMBDA LIGHT CHAINS
Kappa free light chain: 13.1 mg/L (ref 3.3–19.4)
Kappa, lambda light chain ratio: 1.2 (ref 0.26–1.65)
Lambda free light chains: 10.9 mg/L (ref 5.7–26.3)

## 2020-03-04 ENCOUNTER — Telehealth: Payer: Self-pay | Admitting: Psychiatry

## 2020-03-04 NOTE — Telephone Encounter (Signed)
Left patient a voicemail with the increase and to call back if needing a Rx now.

## 2020-03-04 NOTE — Telephone Encounter (Signed)
Tiffany Leon called because when she saw you a couple of weeks ago you started her on Abilify and told her to call if it wasn't helping enough.  She does feel it needs to be increased.  Please call to discuss.  [facility] [address]

## 2020-03-04 NOTE — Telephone Encounter (Signed)
She has Abilify 2 mg tablets and is taking 1 daily.  Have her increase to 2 of the 2 mg tablets or if she is ready for prescription we will send in a prescription for one of the 5 mg tablets to make it more convenient.  Number 30 tablets and 1 refill.

## 2020-03-06 ENCOUNTER — Other Ambulatory Visit: Payer: Self-pay

## 2020-03-06 DIAGNOSIS — F331 Major depressive disorder, recurrent, moderate: Secondary | ICD-10-CM

## 2020-03-06 MED ORDER — ARIPIPRAZOLE 5 MG PO TABS: 5.0000 mg | ORAL_TABLET | Freq: Every day | ORAL | 1 refills | Status: DC

## 2020-03-06 NOTE — Telephone Encounter (Signed)
Tiffany Leon did call back. She would like you to send in an RX for Abilify 5mg  to [facility] on [address]. Thanks.

## 2020-03-06 NOTE — Telephone Encounter (Signed)
Rx sent 

## 2020-03-20 ENCOUNTER — Other Ambulatory Visit: Payer: Self-pay

## 2020-03-20 MED ORDER — FUROSEMIDE 40 MG PO TABS
20.0000 mg | ORAL_TABLET | Freq: Every day | ORAL | 1 refills | Status: DC
Start: 2020-03-20 — End: 2020-03-25

## 2020-03-25 MED ORDER — FUROSEMIDE 40 MG PO TABS
20.0000 mg | ORAL_TABLET | Freq: Two times a day (BID) | ORAL | 3 refills | Status: DC
Start: 2020-03-25 — End: 2020-03-27

## 2020-03-25 NOTE — Telephone Encounter (Signed)
Left message for patient to call back to confirm dosage.

## 2020-03-25 NOTE — Telephone Encounter (Signed)
Spoke with patient. Confirmed patient takes 0.5 tablet of 40mg  Lasix by mouth twice a day. Refill sent to pharmacy as requested.

## 2020-03-27 ENCOUNTER — Telehealth: Payer: Self-pay | Admitting: Internal Medicine

## 2020-03-27 MED ORDER — FUROSEMIDE 40 MG PO TABS
20.0000 mg | ORAL_TABLET | Freq: Two times a day (BID) | ORAL | 3 refills | Status: DC
Start: 2020-03-27 — End: 2020-06-12

## 2020-03-27 NOTE — Telephone Encounter (Signed)
   *  STAT* If patient is at the pharmacy, call can be transferred to refill team.   1. Which medications need to be refilled? (please list name of each medication and dose if known)   furosemide (LASIX) 40 MG tablet    2. Which pharmacy/location (including street and city if local pharmacy) is medication to be sent to? [id number] [place],  - [place]  3. Do they need a 30 day or 90 day supply? 90 days   Pt spoke with pharmacy and was told they did not received refill. Please resend refill for lasix

## 2020-03-31 ENCOUNTER — Other Ambulatory Visit: Payer: Self-pay | Admitting: Psychiatry

## 2020-03-31 DIAGNOSIS — F331 Major depressive disorder, recurrent, moderate: Secondary | ICD-10-CM

## 2020-04-21 ENCOUNTER — Other Ambulatory Visit: Payer: Self-pay | Admitting: *Deleted

## 2020-04-21 MED ORDER — CARVEDILOL 12.5 MG PO TABS: 12.5000 mg | ORAL_TABLET | Freq: Two times a day (BID) | ORAL | 3 refills | Status: DC

## 2020-04-24 ENCOUNTER — Ambulatory Visit: Payer: Medicare Other | Admitting: Psychiatry

## 2020-04-24 ENCOUNTER — Telehealth: Payer: Self-pay | Admitting: Psychiatry

## 2020-04-24 NOTE — Telephone Encounter (Signed)
Pt stated she has no motivation at all. Asking to increase Abilify again from 5 mg. Started @ 2 mg now taking 5 mg. Stated gets a spirt of energy for short time after taking and goes away. Still depressed. Pt has 90 day supply of Abilify from 03/31/20 Rx. Advise if pt needs increase. Also, refill needed for Clonazepam. Apt now 2/28. Do I put on canc list? Contact [phone]

## 2020-04-26 ENCOUNTER — Other Ambulatory Visit: Payer: Self-pay | Admitting: Psychiatry

## 2020-04-26 DIAGNOSIS — F331 Major depressive disorder, recurrent, moderate: Secondary | ICD-10-CM

## 2020-04-26 DIAGNOSIS — F4001 Agoraphobia with panic disorder: Secondary | ICD-10-CM

## 2020-04-28 ENCOUNTER — Telehealth: Payer: Self-pay | Admitting: Psychiatry

## 2020-04-29 ENCOUNTER — Other Ambulatory Visit: Payer: Self-pay | Admitting: Psychiatry

## 2020-04-29 DIAGNOSIS — F331 Major depressive disorder, recurrent, moderate: Secondary | ICD-10-CM

## 2020-04-29 MED ORDER — ARIPIPRAZOLE 10 MG PO TABS: 10.0000 mg | ORAL_TABLET | Freq: Every day | ORAL | 1 refills | Status: DC

## 2020-04-29 NOTE — Telephone Encounter (Signed)
Next appt is 06/16/20. Asking if she can increase her Abilify from 5 mg to 10 mg? Her number is [phone].

## 2020-04-29 NOTE — Telephone Encounter (Signed)
Please let her know I agree to increase Abilify to 10 mg daily.  I sent in RX.

## 2020-04-30 NOTE — Telephone Encounter (Signed)
Let her know I'm sorry for the delay in response.  OK to increase Abilify to 1 and 1/2 of the 5 mg tablets for 2 weeks and if not better, then increase to 2 of the 5 mg tablets.    Put her on cancellation list.

## 2020-05-16 ENCOUNTER — Telehealth: Payer: Self-pay

## 2020-05-16 ENCOUNTER — Other Ambulatory Visit: Payer: Self-pay

## 2020-05-16 ENCOUNTER — Inpatient Hospital Stay: Payer: Medicare Other

## 2020-05-16 ENCOUNTER — Inpatient Hospital Stay: Payer: Medicare Other | Attending: Hematology & Oncology

## 2020-05-16 ENCOUNTER — Inpatient Hospital Stay (HOSPITAL_BASED_OUTPATIENT_CLINIC_OR_DEPARTMENT_OTHER): Payer: Medicare Other | Admitting: Hematology & Oncology

## 2020-05-16 VITALS — BP 106/47 | HR 71 | Temp 97.9°F | Resp 18

## 2020-05-16 DIAGNOSIS — C880 Waldenstrom macroglobulinemia: Secondary | ICD-10-CM

## 2020-05-16 DIAGNOSIS — N189 Chronic kidney disease, unspecified: Secondary | ICD-10-CM | POA: Diagnosis not present

## 2020-05-16 DIAGNOSIS — D649 Anemia, unspecified: Secondary | ICD-10-CM | POA: Diagnosis not present

## 2020-05-16 DIAGNOSIS — Z5112 Encounter for antineoplastic immunotherapy: Secondary | ICD-10-CM | POA: Diagnosis present

## 2020-05-16 DIAGNOSIS — Z79899 Other long term (current) drug therapy: Secondary | ICD-10-CM | POA: Insufficient documentation

## 2020-05-16 LAB — CMP (CANCER CENTER ONLY)
ALT: 23 U/L (ref 0–44)
AST: 19 U/L (ref 15–41)
Albumin: 4.3 g/dL (ref 3.5–5.0)
Alkaline Phosphatase: 61 U/L (ref 38–126)
Anion gap: 8 (ref 5–15)
BUN: 25 mg/dL — ABNORMAL HIGH (ref 8–23)
CO2: 28 mmol/L (ref 22–32)
Calcium: 9.6 mg/dL (ref 8.9–10.3)
Chloride: 103 mmol/L (ref 98–111)
Creatinine: 1.01 mg/dL — ABNORMAL HIGH (ref 0.44–1.00)
GFR, Estimated: 60 mL/min — ABNORMAL LOW (ref >60)
Glucose, Bld: 96 mg/dL (ref 70–99)
Potassium: 3.6 mmol/L (ref 3.5–5.1)
Sodium: 139 mmol/L (ref 135–145)
Total Bilirubin: 0.4 mg/dL (ref 0.3–1.2)
Total Protein: 6.2 g/dL — ABNORMAL LOW (ref 6.5–8.1)

## 2020-05-16 LAB — CBC WITH DIFFERENTIAL (CANCER CENTER ONLY)
Abs Immature Granulocytes: 0.14 10*3/uL — ABNORMAL HIGH (ref 0.00–0.07)
Basophils Absolute: 0 10*3/uL (ref 0.0–0.1)
Basophils Relative: 0 %
Eosinophils Absolute: 0.1 10*3/uL (ref 0.0–0.5)
Eosinophils Relative: 1 %
HCT: 35.7 % — ABNORMAL LOW (ref 36.0–46.0)
Hemoglobin: 12.3 g/dL (ref 12.0–15.0)
Immature Granulocytes: 2 %
Lymphocytes Relative: 28 %
Lymphs Abs: 1.8 10*3/uL (ref 0.7–4.0)
MCH: 33.6 pg (ref 26.0–34.0)
MCHC: 34.5 g/dL (ref 30.0–36.0)
MCV: 97.5 fL (ref 80.0–100.0)
Monocytes Absolute: 0.5 10*3/uL (ref 0.1–1.0)
Monocytes Relative: 8 %
Neutro Abs: 4 10*3/uL (ref 1.7–7.7)
Neutrophils Relative %: 61 %
Platelet Count: 244 10*3/uL (ref 150–400)
RBC: 3.66 MIL/uL — ABNORMAL LOW (ref 3.87–5.11)
RDW: 13 % (ref 11.5–15.5)
WBC Count: 6.6 10*3/uL (ref 4.0–10.5)
nRBC: 0 % (ref 0.0–0.2)

## 2020-05-16 LAB — LACTATE DEHYDROGENASE: LDH: 163 U/L (ref 98–192)

## 2020-05-16 MED ORDER — DIPHENHYDRAMINE HCL 25 MG PO CAPS
50.0000 mg | ORAL_CAPSULE | Freq: Once | ORAL | Status: AC
  Administered 2020-05-16: 50 mg via ORAL

## 2020-05-16 MED ORDER — SODIUM CHLORIDE 0.9 % IV SOLN
Freq: Once | INTRAVENOUS | Status: AC
  Filled 2020-05-16: qty 250

## 2020-05-16 MED ORDER — ACETAMINOPHEN 325 MG PO TABS
650.0000 mg | ORAL_TABLET | Freq: Once | ORAL | Status: AC
  Administered 2020-05-16: 650 mg via ORAL

## 2020-05-16 MED ORDER — SODIUM CHLORIDE 0.9% FLUSH
10.0000 mL | INTRAVENOUS | Status: DC | PRN
  Administered 2020-05-16: 10 mL
  Filled 2020-05-16: qty 10

## 2020-05-16 MED ORDER — DIPHENHYDRAMINE HCL 25 MG PO CAPS
ORAL_CAPSULE | ORAL | Status: AC
  Filled 2020-05-16: qty 2

## 2020-05-16 MED ORDER — HEPARIN SOD (PORK) LOCK FLUSH 100 UNIT/ML IV SOLN
500.0000 [IU] | Freq: Once | INTRAVENOUS | Status: AC | PRN
  Administered 2020-05-16: 500 [IU]
  Filled 2020-05-16: qty 5

## 2020-05-16 MED ORDER — ACETAMINOPHEN 325 MG PO TABS
ORAL_TABLET | ORAL | Status: AC
  Filled 2020-05-16: qty 2

## 2020-05-16 MED ORDER — SODIUM CHLORIDE 0.9 % IV SOLN
375.0000 mg/m2 | Freq: Once | INTRAVENOUS | Status: AC
  Administered 2020-05-16: 600 mg via INTRAVENOUS
  Filled 2020-05-16: qty 50

## 2020-05-16 NOTE — Patient Instructions (Signed)
Rituximab Injection What is this medicine? RITUXIMAB (ri TUX i mab) is a monoclonal antibody. It is used to treat certain types of cancer like non-Hodgkin lymphoma and chronic lymphocytic leukemia. It is also used to treat rheumatoid arthritis, granulomatosis with polyangiitis, microscopic polyangiitis, and pemphigus vulgaris. This medicine may be used for other purposes; ask your health care provider or pharmacist if you have questions. COMMON BRAND NAME(S): RIABNI, Rituxan, RUXIENCE What should I tell my health care provider before I take this medicine? They need to know if you have any of these conditions:  chest pain  heart disease  infection especially a viral infection such as chickenpox, cold sores, hepatitis B, or herpes  immune system problems  irregular heartbeat or rhythm  kidney disease  low blood counts (white cells, platelets, or red cells)  lung disease  recent or upcoming vaccine  an unusual or allergic reaction to rituximab, other medicines, foods, dyes, or preservatives  pregnant or trying to get pregnant  breast-feeding How should I use this medicine? This medicine is injected into a vein. It is given by a health care provider in a hospital or clinic setting. A special MedGuide will be given to you before each treatment. Be sure to read this information carefully each time. Talk to your health care provider about the use of this medicine in children. While this drug may be prescribed for children as young as 2 years for selected conditions, precautions do apply. Overdosage: If you think you have taken too much of this medicine contact a poison control center or emergency room at once. NOTE: This medicine is only for you. Do not share this medicine with others. What if I miss a dose? Keep appointments for follow-up doses. It is important not to miss your dose. Call your health care provider if you are unable to keep an appointment. What may interact with this  medicine? Do not take this medicine with any of the following medicines:  live vaccines This medicine may also interact with the following medicines:  cisplatin This list may not describe all possible interactions. Give your health care provider a list of all the medicines, herbs, non-prescription drugs, or dietary supplements you use. Also tell them if you smoke, drink alcohol, or use illegal drugs. Some items may interact with your medicine. What should I watch for while using this medicine? Your condition will be monitored carefully while you are receiving this medicine. You may need blood work done while you are taking this medicine. This medicine can cause serious infusion reactions. To reduce the risk your health care provider may give you other medicines to take before receiving this one. Be sure to follow the directions from your health care provider. This medicine may increase your risk of getting an infection. Call your health care provider for advice if you get a fever, chills, sore throat, or other symptoms of a cold or flu. Do not treat yourself. Try to avoid being around people who are sick. Call your health care provider if you are around anyone with measles, chickenpox, or if you develop sores or blisters that do not heal properly. Avoid taking medicines that contain aspirin, acetaminophen, ibuprofen, naproxen, or ketoprofen unless instructed by your health care provider. These medicines may hide a fever. This medicine may cause serious skin reactions. They can happen weeks to months after starting the medicine. Contact your health care provider right away if you notice fevers or flu-like symptoms with a rash. The rash may be red   or purple and then turn into blisters or peeling of the skin. Or, you might notice a red rash with swelling of the face, lips or lymph nodes in your neck or under your arms. In some patients, this medicine may cause a serious brain infection that may cause  death. If you have any problems seeing, thinking, speaking, walking, or standing, tell your healthcare professional right away. If you cannot reach your healthcare professional, urgently seek other source of medical care. Do not become pregnant while taking this medicine or for at least 12 months after stopping it. Women should inform their health care provider if they wish to become pregnant or think they might be pregnant. There is potential for serious harm to an unborn child. Talk to your health care provider for more information. Women should use a reliable form of birth control while taking this medicine and for 12 months after stopping it. Do not breast-feed while taking this medicine or for at least 6 months after stopping it. What side effects may I notice from receiving this medicine? Side effects that you should report to your health care provider as soon as possible:  allergic reactions (skin rash, itching or hives; swelling of the face, lips, or tongue)  diarrhea  edema (sudden weight gain; swelling of the ankles, feet, hands or other unusual swelling; trouble breathing)  fast, irregular heartbeat  heart attack (trouble breathing; pain or tightness in the chest, neck, back or arms; unusually weak or tired)  infection (fever, chills, cough, sore throat, pain or trouble passing urine)  kidney injury (trouble passing urine or change in the amount of urine)  liver injury (dark yellow or Hady Niemczyk urine; general ill feeling or flu-like symptoms; loss of appetite, right upper belly pain; unusually weak or tired, yellowing of the eyes or skin)  low blood pressure (dizziness; feeling faint or lightheaded, falls; unusually weak or tired)  low red blood cell counts (trouble breathing; feeling faint; lightheaded, falls; unusually weak or tired)  mouth sores  redness, blistering, peeling, or loosening of the skin, including inside the mouth  stomach pain  unusual bruising or  bleeding  wheezing (trouble breathing with loud or whistling sounds)  vomiting Side effects that usually do not require medical attention (report to your health care provider if they continue or are bothersome):  headache  joint pain  muscle cramps, pain  nausea This list may not describe all possible side effects. Call your doctor for medical advice about side effects. You may report side effects to FDA at 1-800-FDA-1088. Where should I keep my medicine? This medicine is given in a hospital or clinic. It will not be stored at home. NOTE: This sheet is a summary. It may not cover all possible information. If you have questions about this medicine, talk to your doctor, pharmacist, or health care provider.  2021 Elsevier/Gold Standard (2020-01-17 21:35:50)

## 2020-05-16 NOTE — Progress Notes (Signed)
Hematology and Oncology Follow Up Visit  Rheba Leon [id number] 1951/02/09 70 y.o. 05/16/2020   Principle Diagnosis:  Waldenstrom's macroglobulinemia Anemia, chronic renal insufficiency, probable rheumatoid arthritis  Current Therapy:   Rituxan/Cytoxan-- start cycle #1 on 11/10/2018 -- cytoxan d/c'ed on 12/01/2018 Rituxan 375mg /m2 IV q 2 months -- maintanence Retacrit 40,000 units sq for Hgb < 11   Interim History:  [name] is here today for follow-up.  I am very impressed with how well she looks.  She looks wonderful.  She had good holiday season.  She was with her family.  It was really somewhat quiet.  She, as always, has done a lot of cooking.    When we last saw her in November, her monoclonal spike was not observed.  Her IgM level was 75 mg/dL.  She has had no problem with infections.  She has had her coronavirus vaccines.  There is been no issues with nausea or vomiting.  She has had no bleeding.  She is urinating well.  She has had no leg swelling.  Her arthritis has not been too bad.  She does feel tired.  Her blood pressure is quite low.  I told her that she probably does not need to be on 3 different blood pressure medications.  I think that she can probably stop the valsartan and the Lasix.  She still will stick with the carvedilol.  She sees her family doctor in 1 month.  Overall, I would say performance status is ECOG 1.    Medications:  Allergies as of 05/16/2020      Reactions   Codeine Nausea And Vomiting, Other (See Comments)      Medication List       Accurate as of May 16, 2020  9:32 AM. If you have any questions, ask your nurse or doctor.        STOP taking these medications   amLODipine 2.5 MG tablet Commonly known as: NORVASC Stopped by: [name], MD     TAKE these medications   ARIPiprazole 10 MG tablet Commonly known as: ABILIFY Take 1 tablet (10 mg total) by mouth daily.   aspirin 81 MG chewable tablet Chew 1 tablet (81 mg  total) by mouth daily.   atorvastatin 40 MG tablet Commonly known as: LIPITOR Take 1 tablet (40 mg total) by mouth daily at 6 PM.   carvedilol 12.5 MG tablet Commonly known as: COREG Take 1 tablet (12.5 mg total) by mouth 2 (two) times daily with a meal.   clonazePAM 0.5 MG tablet Commonly known as: KLONOPIN TAKE 1 TABLET BY MOUTH 2 TIMES DAILY. What changed: Another medication with the same name was removed. Continue taking this medication, and follow the directions you see here. Changed by: [name], MD   famotidine 40 MG tablet Commonly known as: PEPCID Take 1 tablet (40 mg total) by mouth 2 (two) times daily.   folic acid 1 MG tablet Commonly known as: FOLVITE Take 1 tablet (1 mg total) by mouth daily.   furosemide 40 MG tablet Commonly known as: LASIX Take 0.5 tablets (20 mg total) by mouth 2 (two) times daily. Take 1/2 tablet (20 mg) by mouth in the morning and 1/2 tablet at lunch time.   levothyroxine 75 MCG tablet Commonly known as: SYNTHROID Take 75 mcg by mouth daily.   PARoxetine 40 MG tablet Commonly known as: PAXIL Take 40 mg by mouth.   PARoxetine 40 MG tablet Commonly known as: PAXIL Take 1.5 tablets (  60 mg total) by mouth daily.   potassium chloride 10 MEQ tablet Commonly known as: KLOR-CON Take 1 tablet (10 mEq total) by mouth daily.   predniSONE 10 MG tablet Commonly known as: DELTASONE TAKE 1 TABLET BY MOUTH DAILY WITH BREAKFAST   prochlorperazine 10 MG tablet Commonly known as: COMPAZINE 1 tablet as needed   traZODone 50 MG tablet Commonly known as: DESYREL Take 1-2 tablets (50-100 mg total) by mouth at bedtime.   valsartan 80 MG tablet Commonly known as: Diovan Take 1 tablet (80 mg total) by mouth daily.       Allergies:  Allergies  Allergen Reactions  . Codeine Nausea And Vomiting and Other (See Comments)    Past Medical History, Surgical history, Social history, and Family History were reviewed and updated.  Review  of Systems: Review of Systems  Constitutional: Negative.   HENT: Negative.   Eyes: Negative.   Respiratory: Negative.   Cardiovascular: Negative.   Gastrointestinal: Negative.   Genitourinary: Negative.   Musculoskeletal: Positive for joint pain.  Skin: Negative.   Neurological: Negative.   Endo/Heme/Allergies: Negative.   Psychiatric/Behavioral: Negative.      Physical Exam:  vitals were not taken for this visit.   Wt Readings from Last 3 Encounters:  05/16/20 135 lb (61.2 kg)  02/22/20 130 lb (59 kg)  12/25/19 128 lb (58.1 kg)    Physical Exam Vitals reviewed.  HENT:     Head: Normocephalic and atraumatic.  Eyes:     Pupils: Pupils are equal, round, and reactive to light.  Cardiovascular:     Rate and Rhythm: Normal rate and regular rhythm.     Heart sounds: Normal heart sounds.  Pulmonary:     Effort: Pulmonary effort is normal.     Breath sounds: Normal breath sounds.  Abdominal:     General: Bowel sounds are normal.     Palpations: Abdomen is soft.  Musculoskeletal:        General: No tenderness or deformity. Normal range of motion.     Cervical back: Normal range of motion.  Lymphadenopathy:     Cervical: No cervical adenopathy.  Skin:    General: Skin is warm and dry.     Findings: No erythema or rash.  Neurological:     Mental Status: She is alert and oriented to person, place, and time.  Psychiatric:        Behavior: Behavior normal.        Thought Content: Thought content normal.        Judgment: Judgment normal.      Lab Results  Component Value Date   WBC 6.6 05/16/2020   HGB 12.3 05/16/2020   HCT 35.7 (L) 05/16/2020   MCV 97.5 05/16/2020   PLT 244 05/16/2020   Lab Results  Component Value Date   FERRITIN 261 12/25/2019   IRON 122 12/25/2019   TIBC 270 12/25/2019   UIBC 148 12/25/2019   IRONPCTSAT 45 12/25/2019   Lab Results  Component Value Date   RETICCTPCT 2.2 12/25/2019   RBC 3.66 (L) 05/16/2020   Lab Results  Component  Value Date   KPAFRELGTCHN 13.1 02/22/2020   LAMBDASER 10.9 02/22/2020   KAPLAMBRATIO 1.20 02/22/2020   Lab Results  Component Value Date   IGGSERUM 247 (L) 02/22/2020   IGA 60 (L) 02/22/2020   IGMSERUM 75 02/22/2020   Lab Results  Component Value Date   TOTALPROTELP 6.0 02/22/2020   ALBUMINELP 3.8 02/22/2020   A1GS 0.2 02/22/2020  A2GS 0.9 02/22/2020   BETS 0.8 02/22/2020   GAMS 0.3 (L) 02/22/2020   MSPIKE Not Observed 02/22/2020   SPEI Comment 12/25/2019     Chemistry      Component Value Date/Time   NA 139 05/16/2020 0852   K 3.6 05/16/2020 0852   CL 103 05/16/2020 0852   CO2 28 05/16/2020 0852   BUN 25 (H) 05/16/2020 0852   CREATININE 1.01 (H) 05/16/2020 0852      Component Value Date/Time   CALCIUM 9.6 05/16/2020 0852   ALKPHOS 61 05/16/2020 0852   AST 19 05/16/2020 0852   ALT 23 05/16/2020 0852   BILITOT 0.4 05/16/2020 0852       Impression and Plan: Ms. Caillier is a very pleasant 70 yo caucasian female with Waldenstrm's macroglobulinemia.  She has responded very well to Rituxan.  We initially had her on Rituxan with Cytoxan but she cannot tolerate the Cytoxan with her blood counts going down.  She is doing well on maintenance Rituxan.  We will go ahead and continue her on Rituxan.  She will finished Rituxan in August.  Her hemoglobin is doing quite well.  She has not required any ESA for quite a while.  I am just glad that her quality of life is where she likes it to be.  We will now get her back the end of March of 2022.    [name], MD 1/28/20229:32 AM

## 2020-05-16 NOTE — Telephone Encounter (Signed)
appts made per 05/16/20 los and pt to rec sch in tx/avs     Tiffany Leon

## 2020-05-17 LAB — IGG, IGA, IGM
IgA: 63 mg/dL — ABNORMAL LOW (ref 64–422)
IgG (Immunoglobin G), Serum: 244 mg/dL — ABNORMAL LOW (ref 586–1602)
IgM (Immunoglobulin M), Srm: 69 mg/dL (ref 26–217)

## 2020-05-19 LAB — PROTEIN ELECTROPHORESIS, SERUM, WITH REFLEX
A/G Ratio: 1.6 (ref 0.7–1.7)
Albumin ELP: 3.6 g/dL (ref 2.9–4.4)
Alpha-1-Globulin: 0.2 g/dL (ref 0.0–0.4)
Alpha-2-Globulin: 1 g/dL (ref 0.4–1.0)
Beta Globulin: 0.8 g/dL (ref 0.7–1.3)
Gamma Globulin: 0.3 g/dL — ABNORMAL LOW (ref 0.4–1.8)
Globulin, Total: 2.3 g/dL (ref 2.2–3.9)
Total Protein ELP: 5.9 g/dL — ABNORMAL LOW (ref 6.0–8.5)

## 2020-05-19 LAB — KAPPA/LAMBDA LIGHT CHAINS
Kappa free light chain: 12.7 mg/L (ref 3.3–19.4)
Kappa, lambda light chain ratio: 1.15 (ref 0.26–1.65)
Lambda free light chains: 11 mg/L (ref 5.7–26.3)

## 2020-05-23 ENCOUNTER — Other Ambulatory Visit: Payer: Self-pay | Admitting: Psychiatry

## 2020-06-08 ENCOUNTER — Encounter: Payer: Self-pay | Admitting: Hematology & Oncology

## 2020-06-12 ENCOUNTER — Ambulatory Visit: Payer: Medicare Other | Admitting: Internal Medicine

## 2020-06-12 ENCOUNTER — Other Ambulatory Visit: Payer: Self-pay

## 2020-06-12 VITALS — BP 104/70 | HR 72 | Ht 65.5 in | Wt 133.0 lb

## 2020-06-12 DIAGNOSIS — R5383 Other fatigue: Secondary | ICD-10-CM | POA: Diagnosis not present

## 2020-06-12 DIAGNOSIS — F32A Depression, unspecified: Secondary | ICD-10-CM

## 2020-06-12 DIAGNOSIS — I428 Other cardiomyopathies: Secondary | ICD-10-CM

## 2020-06-12 NOTE — Patient Instructions (Signed)

## 2020-06-12 NOTE — Progress Notes (Signed)
OFFICE NOTE  Chief Complaint:  Follow-up  Primary Care Physician: Aretta Nip, MD  HPI:  Tiffany Leon is a 70 y.o. female with a past medial history significant for chronic combined systolic and diastolic congestive heart failure with recent echo in June 2020 showed EF 45 to 50% and diffuse hypokinesis with trivial to small pericardial effusion, aortic atherosclerosis and coronary artery calcifications noted on chest CT, acute kidney injury, pleural effusion status post thoracentesis, recent elevated sedimentation rate greater than 100 and other medical problems.  She was seen by me recently in the hospital for heart failure.  Initially she had acute renal failure and markedly elevated sedimentation rate.  She responded to diuresis with a plan for outpatient medication adjustment and ischemia work-up.  She was seen in a telemedicine visit by Doreene Adas, PA-C on 10/13/2018.  Blood pressure and still not been well controlled.  She was placed on BiDil in the hospital however this was cost effective and therefore was changed to hydralazine and Imdur.  Today her blood pressure remains elevated 168/88.  She is accompanied by her husband who notes that her blood pressures were quite high at home up to 001 systolic.  She says she feels poorly.  She was in a wheelchair and cannot walk up into the office today.  She gets chills but no fevers.  Recent COVID testing was negative in the hospital.  She has seen Dr. Marin Olp with oncology who feels that she may have either hematologic malignancy and/or rheumatoid arthritis.  She did have a high RF test and given her high sedimentation rate this is a reasonable differential diagnosis.  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  Pan CT scans are pending as well as rheumatologic referral.  Heart failure wise she denies any worsening edema.  Her weight is much lower than dry weight by about 7 pounds today.  Appetite is decreased.  Remains on the  diuretic.  Multiple labs ordered by her oncologist about 4 days ago are pending.  01/29/2019  Tiffany Leon returns today for follow-up.  She apparently saw Dr. Trudie Reed with rheumatology this morning, who felt that she did not have clinical joint symptoms consistent with RA however she was on treatment for her Shea Evans which is also treatment for RA.  Symptomatically she is improved somewhat.  Her oncologist feels that she could undergo ischemic evaluation.  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  08/31/2019  Tiffany Leon is seen today in follow-up.  Overall she says she is doing pretty well.  She denies any worsening shortness of breath or chest pain.  She is still undergoing some chemotherapy and has a low-dose CT coming up in May.  Weight is up about 4 pounds.  She did have some systolic heart failure with EF 45 to 50%.  She does not seem to recall this and when I told her today she seemed quite upset.  I explained it was only mildly reduced last year however the goal was to try to see if we could get her on optimal heart failure therapy.  That being said my plan was to try to see if I could get her off of the hydralazine and onto an ARB if her renal function improved.  Her creatinine most recently was 0.98 indicating that it has improved.  06/12/2020  Tiffany Leon is seen today in follow-up.  She seems to be stably  short of breath and fatigue.  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  Her last echo showed improvement in EF up to 60 to 65% in the fall.  Unfortunately she came off of valsartan due to hypotension.  She thinks a lot of her symptoms are due to seasonal depression which she struggled with.  She says she has about 3 more treatments left with her oncologist.  She denies any chest pain.  PMHx:  Past Medical History:  Diagnosis Date  . Acute kidney injury ([place])    a. 09/2018  . Anxiety    . Aortic atherosclerosis ([place])    a. 09/2018 noted on Chest CT.  Marland Kitchen Chronic combined systolic (congestive) and diastolic (congestive) heart failure ([place])    a. 09/2018 Echo: EF 45-50%, diff HK. Triv to small circumfirential pericardial eff.  . Chronic DOE (dyspnea on exertion)   . Claudication [facility])    a. Bilat hip claudication since ~ 2019.  Marland Kitchen Depression   . Dyspnea   . Emphysema lung ([place])   . Exertional angina ([facility])    a. Ex angina since ~ 2019.  Marland Kitchen GERD (gastroesophageal reflux disease)   . Goals of care, counseling/discussion 11/03/2018  . Hiatal hernia   . History of bronchitis   . History of kidney stones   . Hypothyroidism   . Pleural effusion    a. 09/2018 s/p thoracentesis-->945ml.  . Pleural effusion 09/2018  . Rash 10/2018   Rash began Saturday with unknown reason.  MD seen Monday 10/23/18  . Resting tremor   . Tobacco abuse   . Waldenstrom's macroglobulinemia ([place]) 11/03/2018    Past Surgical History:  Procedure Laterality Date  . BREAST LUMPECTOMY WITH RADIOACTIVE SEED LOCALIZATION Left 12/28/2017   Procedure: BREAST LUMPECTOMY WITH RADIOACTIVE SEED LOCALIZATION;  Surgeon: Jovita Kussmaul, MD;  Location: [place];  Service: General;  Laterality: Left;  . DG THUMB RIGHT HAND ([place] [facility])    . IR IMAGING GUIDED PORT INSERTION  11/09/2018  . IR THORACENTESIS ASP PLEURAL SPACE W/IMG GUIDE  09/20/2018  . KIDNEY STONE SURGERY      FAMHx:  Family History  Problem Relation Age of Onset  . Stroke Mother        Mini-strokes. Died @ 42.  . Dementia Mother   . Lung cancer Father        Died in his 43's  . Addison's disease Sister   . Hypertension Brother   . CAD Brother   . Hypertension Brother     SOCHx:   reports that she has been smoking cigarettes. She has a 10.00 pack-year smoking history. She has never used smokeless tobacco. She reports previous alcohol use. She reports previous drug use.  ALLERGIES:  Allergies  Allergen Reactions  . Codeine Nausea And Vomiting and  Other (See Comments)    ROS: Pertinent items noted in HPI and remainder of comprehensive ROS otherwise negative.  HOME MEDS: Current Outpatient Medications on File Prior to Visit  Medication Sig Dispense Refill  . ARIPiprazole (ABILIFY) 10 MG tablet TAKE 1 TABLET BY MOUTH EVERY DAY 90 tablet 0  . aspirin 81 MG chewable tablet Chew 1 tablet (81 mg total) by mouth daily.    Marland Kitchen atorvastatin (LIPITOR) 40 MG tablet Take 1 tablet (40 mg total) by mouth daily at 6 PM. 90 tablet 3  . carvedilol (COREG) 12.5 MG tablet Take 1 tablet (12.5 mg total) by mouth 2 (two) times daily with a meal. 180 tablet 3  .  clonazePAM (KLONOPIN) 0.5 MG tablet TAKE 1 TABLET BY MOUTH 2 TIMES DAILY. 180 tablet 0  . famotidine (PEPCID) 40 MG tablet Take 1 tablet (40 mg total) by mouth 2 (two) times daily. 60 tablet 3  . folic acid (FOLVITE) 1 MG tablet Take 1 tablet (1 mg total) by mouth daily.    . furosemide (LASIX) 40 MG tablet Take 0.5 tablets (20 mg total) by mouth 2 (two) times daily. Take 1/2 tablet (20 mg) by mouth in the morning and 1/2 tablet at lunch time. 90 tablet 3  . levothyroxine (SYNTHROID) 75 MCG tablet Take 75 mcg by mouth daily.    Marland Kitchen PARoxetine (PAXIL) 40 MG tablet Take 1.5 tablets (60 mg total) by mouth daily. 135 tablet 1  . PARoxetine (PAXIL) 40 MG tablet Take 40 mg by mouth.    . potassium chloride (K-DUR) 10 MEQ tablet Take 1 tablet (10 mEq total) by mouth daily. 90 tablet 3  . predniSONE (DELTASONE) 10 MG tablet TAKE 1 TABLET BY MOUTH DAILY WITH BREAKFAST 30 tablet 3  . prochlorperazine (COMPAZINE) 10 MG tablet 1 tablet as needed    . traZODone (DESYREL) 50 MG tablet Take 1-2 tablets (50-100 mg total) by mouth at bedtime. 180 tablet 1  . valsartan (DIOVAN) 80 MG tablet Take 1 tablet (80 mg total) by mouth daily. 90 tablet 3   No current facility-administered medications on file prior to visit.    LABS/IMAGING: No results found for this or any previous visit (from the past 48 hour(s)). No  results found.  LIPID PANEL:    Component Value Date/Time   CHOL 121 09/28/2018 0328   TRIG 102 09/28/2018 0328   HDL 29 (L) 09/28/2018 0328   CHOLHDL 4.2 09/28/2018 0328   VLDL 20 09/28/2018 0328   LDLCALC 72 09/28/2018 0328     WEIGHTS: Wt Readings from Last 3 Encounters:  05/16/20 135 lb (61.2 kg)  02/22/20 130 lb (59 kg)  12/25/19 128 lb (58.1 kg)    VITALS: There were no vitals taken for this visit.  EXAM: General appearance: alert, no distress and Appears thin and fatigued in wheelchair Neck: no carotid bruit, no JVD and thyroid not enlarged, symmetric, no tenderness/mass/nodules Lungs: diminished breath sounds bilaterally Heart: regular rate and rhythm Abdomen: soft, non-tender; bowel sounds normal; no masses,  no organomegaly Extremities: extremities normal, atraumatic, no cyanosis or edema Pulses: 2+ and symmetric Skin: Pale, cool, dry Neurologic: Mental status: Alert, oriented, thought content appropriate Psych: Fatigue  EKG: Normal sinus rhythm at 72, nonspecific T wave changes-personally reviewed  ASSESSMENT: 1. Chronic systolic congestive heart failure-LVEF 45-50% - improved to 60-65% (01/2020) 2. Multivessel coronary artery calcification 3. Waldenstrom's macroglobulinemia 4. Recent acute kidney injury  PLAN: 1.   Tiffany Leon recently has had relative hypotension, necessitating the discontinuation of valsartan.  She remains on carvedilol.  She reports fatigue and has some difficulty with sleep she thinks is related to depression.  She continues to undergo Cytoxan treatment for [employer] macroglobulinemia.  Hopefully that things will improve when she finishes this.  No changes today to her medicines.  Follow-up with me annually or sooner as necessary.  Pixie Casino, MD, [facility], [place] Director of the [facility]  [facility] Diplomate of the American Board of Clinical  Lipidology Attending Cardiologist  Direct Dial: [phone]  Fax: [phone]  Website:  [email or web address] 06/12/2020, 8:12 AM

## 2020-06-16 ENCOUNTER — Other Ambulatory Visit: Payer: Self-pay

## 2020-06-16 ENCOUNTER — Ambulatory Visit (INDEPENDENT_AMBULATORY_CARE_PROVIDER_SITE_OTHER): Payer: Medicare Other | Admitting: Psychiatry

## 2020-06-16 ENCOUNTER — Encounter: Payer: Self-pay | Admitting: Psychiatry

## 2020-06-16 DIAGNOSIS — F5105 Insomnia due to other mental disorder: Secondary | ICD-10-CM | POA: Diagnosis not present

## 2020-06-16 DIAGNOSIS — F331 Major depressive disorder, recurrent, moderate: Secondary | ICD-10-CM | POA: Diagnosis not present

## 2020-06-16 DIAGNOSIS — F4001 Agoraphobia with panic disorder: Secondary | ICD-10-CM

## 2020-06-16 NOTE — Progress Notes (Signed)
Tiffany Leon [id number] [birth year] 70 y.o.  Subjective:   Patient ID:  Tiffany Leon is a 70 y.o. (DOB 11-11-1950) female.  Chief Complaint:  Chief Complaint  Patient presents with  . Follow-up  . Major depressive disorder, recurrent episode, moderate ([place])  . Fatigue  . Depression    Anxiety Symptoms include dizziness. Patient reports no confusion, decreased concentration, nervous/anxious behavior or suicidal ideas.    Depression        Associated symptoms include fatigue.  Associated symptoms include no decreased concentration, no appetite change and no suicidal ideas.  Past medical history includes anxiety.    Tiffany Leon presents to the office today for follow-up of depression and anxiety.  Patient seen in January 2020.  She was experiencing some depression.  She had previously benefited by the addition of Wellbutrin XL 300 mg daily and that was restarted per her request.  Last seen September 2020.  The following was noted. Weaker on the left side in legs.   Dx Waldenstrom's macroglobunemia, anemia, CKD.  Dx about end of May. Started Wellbutrin after the last visit and it gave her death thoughts and insomnia and stopped.  Took it briefly.  Feels she's handling the stress OK.  Worry over htn which has been hard to control.  Tired and weak.  Losing weight.  Has PT.  Getting chemotx. She had mild anxiety and depression.  No meds were changed.  As of July 31, 2019 the following is noted: Cancer in remission.  Hopes last chemotx soon.  Cardiology May.  Getting stronger. Started driving again in March after a year.  Has GD with her 72 yo.   Overall doing pretty well.   No sig alcohol.  Has been excessive at times in the past. Plan no med changes  02/20/20 appt with following noted: No get up and go.  Generally down.  Nothing in particular.  Tendency to isolate and stay home both lack of motivation and anxiety.  No panic.  No change in sleep pattern.  Heart doing well.  FU cancer  Friday and every 2 mos. Some napping an hour. Plan: Option Abilify low dose, yes. Abilify 2 mg AM.  Disc fear of weight gain.  03/04/2020 phone call: Patient reported Abilify 2 mg did not seem to be enough and wanted to increase to 5 mg daily.  Agreed  04/24/2020 phone call: Patient complaining of no motivation.  Still depressed given her high tolerance she was encouraged to increase Abilify to 7.5 mg daily. 04/28/2020 phone call asking if she can go and just increase Abilify to 10 mg daily.  Agreed.  06/16/2020 appointment with following noted: Not much difference with Abilify.  No SE. When first started it it seemed to help.   Still no energy or get up and go and no motivation.  Some seasonality.   If has to go somewhere the next day then gets marked anxiety but not full panic.  Not getting out much except doctors.   Did visit gkids yesterday.  Gets apprehensive even about going to bed at night.  Routines she goes through at night rituals before can think about going to sleep and then can make herself relax.    No rituals in the daytimes.  Doing this for a few months.    Tendency to seasonal depression continues but is getting a little better.  Pt reports that mood is Anxious and Depressed and describes anxiety as Minimal. Anxiety symptoms include: Excessive Worry,.  No sig panic.  Pt reports no sleep issues and naps daily. Still needs trazodone.   Not as excessive as in the past.  Pt reports that energy is poor but retains some  interest or pleasure in usual activities, poor motivation and withdrawn from usual activities. Concentration is down slightly. Suicidal thoughts:  denied by patient.  Past Psychiatric Medication Trials: Wellbutrin SE, paroxetine,  Abilify 10 mg brief initial benefit then NR clonazepam, trazodone  Review of Systems:  Review of Systems  Constitutional: Positive for fatigue. Negative for appetite change.  Neurological: Positive for dizziness and weakness. Negative for  tremors.  Psychiatric/Behavioral: Positive for depression and dysphoric mood. Negative for agitation, behavioral problems, confusion, decreased concentration, hallucinations, self-injury, sleep disturbance and suicidal ideas. The patient is not nervous/anxious and is not hyperactive.     Medications: I have reviewed the patient's current medications.  Current Outpatient Medications  Medication Sig Dispense Refill  . ARIPiprazole (ABILIFY) 10 MG tablet TAKE 1 TABLET BY MOUTH EVERY DAY 90 tablet 0  . aspirin 81 MG chewable tablet Chew 1 tablet (81 mg total) by mouth daily.    Marland Kitchen atorvastatin (LIPITOR) 40 MG tablet Take 1 tablet (40 mg total) by mouth daily at 6 PM. 90 tablet 3  . carvedilol (COREG) 12.5 MG tablet Take 1 tablet (12.5 mg total) by mouth 2 (two) times daily with a meal. 180 tablet 3  . clonazePAM (KLONOPIN) 0.5 MG tablet TAKE 1 TABLET BY MOUTH 2 TIMES DAILY. 202 tablet 0  . folic acid (FOLVITE) 1 MG tablet Take 1 tablet (1 mg total) by mouth daily.    Marland Kitchen levothyroxine (SYNTHROID) 75 MCG tablet Take 75 mcg by mouth daily.    Marland Kitchen PARoxetine (PAXIL) 40 MG tablet Take 1.5 tablets (60 mg total) by mouth daily. 135 tablet 1  . potassium chloride (K-DUR) 10 MEQ tablet Take 1 tablet (10 mEq total) by mouth daily. 90 tablet 3  . traZODone (DESYREL) 50 MG tablet Take 1-2 tablets (50-100 mg total) by mouth at bedtime. 180 tablet 1  . famotidine (PEPCID) 40 MG tablet Take 1 tablet (40 mg total) by mouth 2 (two) times daily. (Patient not taking: Reported on 06/16/2020) 60 tablet 3  . predniSONE (DELTASONE) 10 MG tablet TAKE 1 TABLET BY MOUTH DAILY WITH BREAKFAST (Patient not taking: Reported on 06/16/2020) 30 tablet 3  . prochlorperazine (COMPAZINE) 10 MG tablet 1 tablet as needed (Patient not taking: Reported on 06/16/2020)     No current facility-administered medications for this visit.    Medication Side Effects: None  Allergies:  Allergies  Allergen Reactions  . Codeine Nausea And Vomiting  and Other (See Comments)    Past Medical History:  Diagnosis Date  . Acute kidney injury ([place])    a. 09/2018  . Anxiety   . Aortic atherosclerosis ([place])    a. 09/2018 noted on Chest CT.  Marland Kitchen Chronic combined systolic (congestive) and diastolic (congestive) heart failure ([place])    a. 09/2018 Echo: EF 45-50%, diff HK. Triv to small circumfirential pericardial eff.  . Chronic DOE (dyspnea on exertion)   . Claudication [facility])    a. Bilat hip claudication since ~ 2019.  Marland Kitchen Depression   . Dyspnea   . Emphysema lung ([place])   . Exertional angina ([facility])    a. Ex angina since ~ 2019.  Marland Kitchen GERD (gastroesophageal reflux disease)   . Goals of care, counseling/discussion 11/03/2018  . Hiatal hernia   . History of bronchitis   . History of kidney stones   .  Hypothyroidism   . Pleural effusion    a. 09/2018 s/p thoracentesis-->920ml.  . Pleural effusion 09/2018  . Rash 10/2018   Rash began Saturday with unknown reason.  MD seen Monday 10/23/18  . Resting tremor   . Tobacco abuse   . Waldenstrom's macroglobulinemia ([place]) 11/03/2018    Family History  Problem Relation Age of Onset  . Stroke Mother        Mini-strokes. Died @ 95.  . Dementia Mother   . Lung cancer Father        Died in his 61's  . Addison's disease Sister   . Hypertension Brother   . CAD Brother   . Hypertension Brother     Social History   Socioeconomic History  . Marital status: Married    Spouse name: Not on file  . Number of children: Not on file  . Years of education: Not on file  . Highest education level: Not on file  Occupational History  . Not on file  Tobacco Use  . Smoking status: Current Every Day Smoker    Packs/day: 0.25    Years: 40.00    Pack years: 10.00    Types: Cigarettes  . Smokeless tobacco: Never Used  . Tobacco comment: smoking 3-4 cigarettes/day  Vaping Use  . Vaping Use: Never used  Substance and Sexual Activity  . Alcohol use: Not Currently    Comment: 1-2 gl wine / night - none since  07/2018  . Drug use: Not Currently    Comment: prev used drugs ~ 35 yrs ago.  Marland Kitchen Sexual activity: Not on file  Other Topics Concern  . Not on file  Social History Narrative   Lives in [place] with her husband.  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   Social Determinants of Health   Financial Resource Strain: Not on file  Food Insecurity: Not on file  Transportation Needs: Not on file  Physical Activity: Not on file  Stress: Not on file  Social Connections: Not on file  Intimate Partner Violence: Not on file    Past Medical History, Surgical history, Social history, and Family history were reviewed and updated as appropriate.   Please see review of systems for further details on the patient's review from today.   Objective:   Physical Exam:  There were no vitals taken for this visit.  Physical Exam Constitutional:      General: She is not in acute distress.    Appearance: She is well-developed. She is not ill-appearing.  Musculoskeletal:        General: No deformity.  Neurological:     Mental Status: She is alert and oriented to person, place, and time.     Motor: No tremor.     Coordination: Coordination normal.     Gait: Gait normal.  Psychiatric:        Attention and Perception: Attention normal. She is attentive. She does not perceive auditory hallucinations.        Mood and Affect: Mood is anxious and depressed. Affect is not labile, blunt, angry or inappropriate.        Speech: Speech normal.        Behavior: Behavior normal.        Thought Content: Thought content normal. Thought content does not include homicidal or suicidal ideation. Thought content does not include homicidal or suicidal plan.        Cognition and Memory: Cognition normal.        Judgment:  Judgment normal.     Comments: Insight is good.  More lethargic.     Lab Review:     Component Value Date/Time   NA 139 05/16/2020 0852   K 3.6 05/16/2020 0852   CL 103 05/16/2020 0852   CO2  28 05/16/2020 0852   GLUCOSE 96 05/16/2020 0852   BUN 25 (H) 05/16/2020 0852   CREATININE 1.01 (H) 05/16/2020 0852   CALCIUM 9.6 05/16/2020 0852   PROT 6.2 (L) 05/16/2020 0852   ALBUMIN 4.3 05/16/2020 0852   AST 19 05/16/2020 0852   ALT 23 05/16/2020 0852   ALKPHOS 61 05/16/2020 0852   BILITOT 0.4 05/16/2020 0852   GFRNONAA 60 (L) 05/16/2020 0852   GFRAA >60 12/25/2019 0822       Component Value Date/Time   WBC 6.6 05/16/2020 0852   WBC 3.1 (L) 01/23/2019 0723   RBC 3.66 (L) 05/16/2020 0852   HGB 12.3 05/16/2020 0852   HCT 35.7 (L) 05/16/2020 0852   PLT 244 05/16/2020 0852   MCV 97.5 05/16/2020 0852   MCH 33.6 05/16/2020 0852   MCHC 34.5 05/16/2020 0852   RDW 13.0 05/16/2020 0852   LYMPHSABS 1.8 05/16/2020 0852   MONOABS 0.5 05/16/2020 0852   EOSABS 0.1 05/16/2020 0852   BASOSABS 0.0 05/16/2020 0852   Normal TSH per PCP.  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  She is having mild symptoms of each.  She did not tolerate the Wellbutrin.   She does not want to start new medications because she is having to take a lot of medications at this point.  She is having physical therapy.  She is getting chemotherapy.  She is tolerating paroxetine and clonazepam well and feels like she needs full dose of both.  DC Abilify Wait 2 weeks, then start Rexulti 2 mg daily. Discussed potential metabolic side effects associated with atypical antipsychotics, as well as potential risk for movement side effects. Advised pt to contact office if movement side effects occur.  Disc half life.      Encouraged social another types of stimulation to the extent that is  possible..  Supportive therapy dealing with the cancer.  FU 6 weeks  Lynder Parents, MD, DFAPA  Please see After Visit Summary for patient specific instructions.  Future Appointments  Date Time Provider [place]  07/11/2020  8:15 AM [facility] LAB [facility] None  07/11/2020  8:30 AM [facility] INJ NURSE [facility] None  07/11/2020  9:00 AM En[place], Rudell Cobb, MD [facility] None  07/11/2020  9:30 AM [facility] A2 [facility] None    No orders of the defined types were placed in this encounter.     -------------------------------

## 2020-06-16 NOTE — Patient Instructions (Signed)
Stop Abilify Wait 2 weeks, then start Rexulti 2 mg tablet 1 daily.

## 2020-07-10 ENCOUNTER — Other Ambulatory Visit: Payer: Self-pay | Admitting: Psychiatry

## 2020-07-10 ENCOUNTER — Telehealth: Payer: Self-pay | Admitting: Psychiatry

## 2020-07-10 MED ORDER — BREXPIPRAZOLE 2 MG PO TABS: 2.0000 mg | ORAL_TABLET | Freq: Every day | ORAL | 1 refills | Status: DC

## 2020-07-10 NOTE — Telephone Encounter (Signed)
Please review

## 2020-07-10 NOTE — Telephone Encounter (Signed)
Pt called and said that dr. Clovis Pu gave her samples of rexulti 2mg  and she is doing well on it. Daizha would like a script of the rexulti 2 mg [facility] [address]

## 2020-07-10 NOTE — Telephone Encounter (Signed)
Reviewed and I will submit PA for Rexulti.

## 2020-07-10 NOTE — Telephone Encounter (Signed)
sent 

## 2020-07-11 ENCOUNTER — Telehealth: Payer: Self-pay

## 2020-07-11 ENCOUNTER — Inpatient Hospital Stay: Payer: Medicare Other

## 2020-07-11 ENCOUNTER — Inpatient Hospital Stay: Payer: Medicare Other | Attending: Hematology & Oncology

## 2020-07-11 ENCOUNTER — Inpatient Hospital Stay (HOSPITAL_BASED_OUTPATIENT_CLINIC_OR_DEPARTMENT_OTHER): Payer: Medicare Other | Admitting: Hematology & Oncology

## 2020-07-11 ENCOUNTER — Encounter: Payer: Self-pay | Admitting: Hematology & Oncology

## 2020-07-11 ENCOUNTER — Other Ambulatory Visit: Payer: Self-pay

## 2020-07-11 VITALS — BP 121/68 | HR 66 | Temp 98.0°F | Resp 18 | Ht 65.5 in | Wt 133.4 lb

## 2020-07-11 VITALS — BP 118/69 | HR 58 | Temp 97.5°F | Resp 17

## 2020-07-11 DIAGNOSIS — C8800 Waldenstrom macroglobulinemia not having achieved remission: Secondary | ICD-10-CM

## 2020-07-11 DIAGNOSIS — Z5112 Encounter for antineoplastic immunotherapy: Secondary | ICD-10-CM | POA: Diagnosis present

## 2020-07-11 DIAGNOSIS — Z79899 Other long term (current) drug therapy: Secondary | ICD-10-CM | POA: Diagnosis not present

## 2020-07-11 DIAGNOSIS — Z95828 Presence of other vascular implants and grafts: Secondary | ICD-10-CM

## 2020-07-11 LAB — CBC WITH DIFFERENTIAL (CANCER CENTER ONLY)
Abs Immature Granulocytes: 0.16 10*3/uL — ABNORMAL HIGH (ref 0.00–0.07)
Basophils Absolute: 0 10*3/uL (ref 0.0–0.1)
Basophils Relative: 0 %
Eosinophils Absolute: 0.1 10*3/uL (ref 0.0–0.5)
Eosinophils Relative: 1 %
HCT: 35.2 % — ABNORMAL LOW (ref 36.0–46.0)
Hemoglobin: 11.8 g/dL — ABNORMAL LOW (ref 12.0–15.0)
Immature Granulocytes: 3 %
Lymphocytes Relative: 28 %
Lymphs Abs: 1.5 10*3/uL (ref 0.7–4.0)
MCH: 32.9 pg (ref 26.0–34.0)
MCHC: 33.5 g/dL (ref 30.0–36.0)
MCV: 98.1 fL (ref 80.0–100.0)
Monocytes Absolute: 0.5 10*3/uL (ref 0.1–1.0)
Monocytes Relative: 9 %
Neutro Abs: 3 10*3/uL (ref 1.7–7.7)
Neutrophils Relative %: 59 %
Platelet Count: 279 10*3/uL (ref 150–400)
RBC: 3.59 MIL/uL — ABNORMAL LOW (ref 3.87–5.11)
RDW: 12.1 % (ref 11.5–15.5)
WBC Count: 5.2 10*3/uL (ref 4.0–10.5)
nRBC: 0 % (ref 0.0–0.2)

## 2020-07-11 LAB — CMP (CANCER CENTER ONLY)
ALT: 14 U/L (ref 0–44)
AST: 14 U/L — ABNORMAL LOW (ref 15–41)
Albumin: 4.2 g/dL (ref 3.5–5.0)
Alkaline Phosphatase: 71 U/L (ref 38–126)
Anion gap: 8 (ref 5–15)
BUN: 18 mg/dL (ref 8–23)
CO2: 25 mmol/L (ref 22–32)
Calcium: 9.5 mg/dL (ref 8.9–10.3)
Chloride: 107 mmol/L (ref 98–111)
Creatinine: 0.99 mg/dL (ref 0.44–1.00)
GFR, Estimated: >60 mL/min (ref >60)
Glucose, Bld: 99 mg/dL (ref 70–99)
Potassium: 3.9 mmol/L (ref 3.5–5.1)
Sodium: 140 mmol/L (ref 135–145)
Total Bilirubin: 0.3 mg/dL (ref 0.3–1.2)
Total Protein: 6.2 g/dL — ABNORMAL LOW (ref 6.5–8.1)

## 2020-07-11 LAB — LACTATE DEHYDROGENASE: LDH: 134 U/L (ref 98–192)

## 2020-07-11 MED ORDER — DIPHENHYDRAMINE HCL 25 MG PO CAPS
ORAL_CAPSULE | ORAL | Status: AC
  Filled 2020-07-11: qty 2

## 2020-07-11 MED ORDER — ACETAMINOPHEN 325 MG PO TABS
ORAL_TABLET | ORAL | Status: AC
  Filled 2020-07-11: qty 2

## 2020-07-11 MED ORDER — SODIUM CHLORIDE 0.9% FLUSH
10.0000 mL | INTRAVENOUS | Status: DC | PRN
  Administered 2020-07-11: 10 mL
  Filled 2020-07-11: qty 10

## 2020-07-11 MED ORDER — ACETAMINOPHEN 325 MG PO TABS
650.0000 mg | ORAL_TABLET | Freq: Once | ORAL | Status: AC
  Administered 2020-07-11: 650 mg via ORAL

## 2020-07-11 MED ORDER — SODIUM CHLORIDE 0.9 % IV SOLN
Freq: Once | INTRAVENOUS | Status: AC
  Filled 2020-07-11: qty 250

## 2020-07-11 MED ORDER — SODIUM CHLORIDE 0.9% FLUSH
10.0000 mL | Freq: Once | INTRAVENOUS | Status: AC
  Administered 2020-07-11: 10 mL via INTRAVENOUS
  Filled 2020-07-11: qty 10

## 2020-07-11 MED ORDER — DIPHENHYDRAMINE HCL 25 MG PO CAPS
50.0000 mg | ORAL_CAPSULE | Freq: Once | ORAL | Status: AC
  Administered 2020-07-11: 50 mg via ORAL

## 2020-07-11 MED ORDER — HEPARIN SOD (PORK) LOCK FLUSH 100 UNIT/ML IV SOLN
500.0000 [IU] | Freq: Once | INTRAVENOUS | Status: AC | PRN
  Administered 2020-07-11: 500 [IU]
  Filled 2020-07-11: qty 5

## 2020-07-11 MED ORDER — SODIUM CHLORIDE 0.9 % IV SOLN
375.0000 mg/m2 | Freq: Once | INTRAVENOUS | Status: AC
  Administered 2020-07-11: 600 mg via INTRAVENOUS
  Filled 2020-07-11: qty 50

## 2020-07-11 NOTE — Patient Instructions (Signed)

## 2020-07-11 NOTE — Telephone Encounter (Signed)
She has a Medicare plan and that is probably the problem.  Have her come pick up 2 weeks of Rexulti 2 mg tablet samples to give [place] time to work on it..  She is retired.  I could write the prescription for 4 mg daily which she could then take every other day reducing her average cost $200 a month but I assume that would still be too much.?  Please call the rep, Doristine Church and see if they have a patient assistance plan that would help this lady or any other ideas.

## 2020-07-11 NOTE — Progress Notes (Signed)
Hematology and Oncology Follow Up Visit  Tiffany Leon [id number] Jun 26, 1950 70 y.o. 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  She really looks great.  She is getting ready to go down to [place].  She will be down there for Easter.  I know she will have a wonderful time.  I told her to make sure that she wears sunscreen and drinks quite a bit of water.  She has had no problems with the Waldenstrom's.  We last saw her in January, her IgM level was 69 mg/dL.  She has had no problems with fever.  There is been no bleeding.  Is no change in bowel or bladder habits.  She has been cooking.  She has been eating well.  She has had no leg swelling.  Overall, I would have to say that her performance status is ECOG 1.    Medications:  Allergies as of 07/11/2020      Reactions   Codeine Nausea And Vomiting, Other (See Comments)      Medication List       Accurate as of July 11, 2020  9:30 AM. If you have any questions, ask your nurse or doctor.        STOP taking these medications   predniSONE 10 MG tablet Commonly known as: DELTASONE Stopped by: [name], MD     TAKE these medications   aspirin 81 MG chewable tablet Chew 1 tablet (81 mg total) by mouth daily.   atorvastatin 40 MG tablet Commonly known as: LIPITOR Take 1 tablet (40 mg total) by mouth daily at 6 PM.   brexpiprazole 2 MG Tabs tablet Commonly known as: Rexulti Take 1 tablet (2 mg total) by mouth daily.   carvedilol 12.5 MG tablet Commonly known as: COREG Take 1 tablet (12.5 mg total) by mouth 2 (two) times daily with a meal.   clonazePAM 0.5 MG tablet Commonly known as:  KLONOPIN TAKE 1 TABLET BY MOUTH 2 TIMES DAILY.   famotidine 40 MG tablet Commonly known as: PEPCID Take 1 tablet (40 mg total) by mouth 2 (two) times daily.   folic acid 1 MG tablet Commonly known as: FOLVITE Take 1 tablet (1 mg total) by mouth daily.   levothyroxine 75 MCG tablet Commonly known as: SYNTHROID Take 75 mcg by mouth daily.   PARoxetine 40 MG tablet Commonly known as: PAXIL Take 1.5 tablets (60 mg total) by mouth daily.   potassium chloride 10 MEQ tablet Commonly known as: KLOR-CON Take 1 tablet (10 mEq total) by mouth daily.   prochlorperazine 10 MG tablet Commonly known as: COMPAZINE 1 tablet as needed   traZODone 50 MG tablet Commonly known as: DESYREL Take 1-2 tablets (50-100 mg total) by mouth at bedtime.       Allergies:  Allergies  Allergen Reactions  . Codeine Nausea And Vomiting and Other (See Comments)    Past Medical History, Surgical history, Social history, and Family History were reviewed and updated.  Review of Systems: Review of Systems  Constitutional: Negative.   HENT: Negative.   Eyes: Negative.   Respiratory: Negative.   Cardiovascular: Negative.   Gastrointestinal: Negative.   Genitourinary: Negative.   Musculoskeletal: Positive for  joint pain.  Skin: Negative.   Neurological: Negative.   Endo/Heme/Allergies: Negative.   Psychiatric/Behavioral: Negative.      Physical Exam:  height is 5' 5.5" (1.664 m) and weight is 133 lb 6.4 oz (60.5 kg). Her oral temperature is 98 F (36.7 C). Her blood pressure is 121/68 and her pulse is 66. Her respiration is 18 and oxygen saturation is 100%.   Wt Readings from Last 3 Encounters:  07/11/20 133 lb 6.4 oz (60.5 kg)  06/12/20 133 lb (60.3 kg)  05/16/20 135 lb (61.2 kg)    Physical Exam Vitals reviewed.  HENT:     Head: Normocephalic and atraumatic.  Eyes:     Pupils: Pupils are equal, round, and reactive to light.  Cardiovascular:     Rate and Rhythm: Normal rate and  regular rhythm.     Heart sounds: Normal heart sounds.  Pulmonary:     Effort: Pulmonary effort is normal.     Breath sounds: Normal breath sounds.  Abdominal:     General: Bowel sounds are normal.     Palpations: Abdomen is soft.  Musculoskeletal:        General: No tenderness or deformity. Normal range of motion.     Cervical back: Normal range of motion.  Lymphadenopathy:     Cervical: No cervical adenopathy.  Skin:    General: Skin is warm and dry.     Findings: No erythema or rash.  Neurological:     Mental Status: She is alert and oriented to person, place, and time.  Psychiatric:        Behavior: Behavior normal.        Thought Content: Thought content normal.        Judgment: Judgment normal.      Lab Results  Component Value Date   WBC 5.2 07/11/2020   HGB 11.8 (L) 07/11/2020   HCT 35.2 (L) 07/11/2020   MCV 98.1 07/11/2020   PLT 279 07/11/2020   Lab Results  Component Value Date   FERRITIN 261 12/25/2019   IRON 122 12/25/2019   TIBC 270 12/25/2019   UIBC 148 12/25/2019   IRONPCTSAT 45 12/25/2019   Lab Results  Component Value Date   RETICCTPCT 2.2 12/25/2019   RBC 3.59 (L) 07/11/2020   Lab Results  Component Value Date   KPAFRELGTCHN 12.7 05/16/2020   LAMBDASER 11.0 05/16/2020   KAPLAMBRATIO 1.15 05/16/2020   Lab Results  Component Value Date   IGGSERUM 244 (L) 05/16/2020   IGA 63 (L) 05/16/2020   IGMSERUM 69 05/16/2020   Lab Results  Component Value Date   TOTALPROTELP 5.9 (L) 05/16/2020   ALBUMINELP 3.6 05/16/2020   A1GS 0.2 05/16/2020   A2GS 1.0 05/16/2020   BETS 0.8 05/16/2020   GAMS 0.3 (L) 05/16/2020   MSPIKE Not Observed 05/16/2020   SPEI Comment 12/25/2019     Chemistry      Component Value Date/Time   NA 140 07/11/2020 0834   K 3.9 07/11/2020 0834   CL 107 07/11/2020 0834   CO2 25 07/11/2020 0834   BUN 18 07/11/2020 0834   CREATININE 0.99 07/11/2020 0834      Component Value Date/Time   CALCIUM 9.5 07/11/2020 0834    ALKPHOS 71 07/11/2020 0834   AST 14 (L) 07/11/2020 0834   ALT 14 07/11/2020 0834   BILITOT 0.3 07/11/2020 0834       Impression and Plan: Tiffany Leon is a very pleasant 70 yo caucasian female with Waldenstrm's macroglobulinemia.  She has responded very well to Rituxan.  We initially had her on Rituxan with Cytoxan but she cannot tolerate the Cytoxan with her blood counts going down.  She is doing well on maintenance Rituxan.  We will go ahead and continue her on Rituxan.  She will finished Rituxan in July.Marland Kitchen  Her hemoglobin is doing quite well.  She has not required any ESA for quite a while.  I am just glad that her quality of life is where she likes it to be.  We will now get her back in a couple months.  I look forward to hearing about her trip to [place].    [name], MD 3/25/20229:30 AM

## 2020-07-11 NOTE — Telephone Encounter (Signed)
Samples pulled pt will pick up on Monday 07/14/20

## 2020-07-11 NOTE — Telephone Encounter (Signed)
appts made per 07/11/20 los and pt will rec sch in tx/avs   Mckyle Solanki

## 2020-07-11 NOTE — Patient Instructions (Signed)
[facility] Discharge Instructions for Patients Receiving Chemotherapy  Today you received the following chemotherapy agents Rituxan  To help prevent nausea and vomiting after your treatment, we encourage you to take your nausea medication as prescribed by MD.   If you develop nausea and vomiting that is not controlled by your nausea medication, call the clinic.   BELOW ARE SYMPTOMS THAT SHOULD BE REPORTED IMMEDIATELY:  *FEVER GREATER THAN 100.5 F  *CHILLS WITH OR WITHOUT FEVER  NAUSEA AND VOMITING THAT IS NOT CONTROLLED WITH YOUR NAUSEA MEDICATION  *UNUSUAL SHORTNESS OF BREATH  *UNUSUAL BRUISING OR BLEEDING  TENDERNESS IN MOUTH AND THROAT WITH OR WITHOUT PRESENCE OF ULCERS  *URINARY PROBLEMS  *BOWEL PROBLEMS  UNUSUAL RASH Items with * indicate a potential emergency and should be followed up as soon as possible.  Feel free to call the clinic should you have any questions or concerns. The clinic phone number is [phone].  Please show the [place] at check-in to the Emergency Department and triage nurse.

## 2020-07-11 NOTE — Telephone Encounter (Signed)
rx sent in for Tiffany Leon 2 mg, pt called back later requesting a PA. This medication is a covered medication does not need a PA but cost to her would be $400.00+ per month. Pharmacist unaware if it's deductible or why it's that amount.   [facility]

## 2020-07-12 LAB — IGG, IGA, IGM
IgA: 64 mg/dL (ref 64–422)
IgG (Immunoglobin G), Serum: 244 mg/dL — ABNORMAL LOW (ref 586–1602)
IgM (Immunoglobulin M), Srm: 71 mg/dL (ref 26–217)

## 2020-07-14 LAB — KAPPA/LAMBDA LIGHT CHAINS
Kappa free light chain: 15.5 mg/L (ref 3.3–19.4)
Kappa, lambda light chain ratio: 1.3 (ref 0.26–1.65)
Lambda free light chains: 11.9 mg/L (ref 5.7–26.3)

## 2020-07-14 NOTE — Telephone Encounter (Signed)
Contacted pt after she picked up her samples today to explain the pt. Assistance form. She reports her and her husband reached out to the manufacturer last week and they reported to her that if her husband still works and they make over $30,000 that they would not qualify. She reports if that's the case then she would rather be on something cheaper. She does go on vacation April 16 th so if she could either have samples of Rexulti 2 mg till then or needs to switch now. She does not want to go on vacation feeling bad. Informed her I would update Dr. Clovis Pu and perhaps her getting samples until after her vacation was best but I would discuss with him and call her back.

## 2020-07-15 LAB — PROTEIN ELECTROPHORESIS, SERUM, WITH REFLEX
A/G Ratio: 1.6 (ref 0.7–1.7)
Albumin ELP: 3.6 g/dL (ref 2.9–4.4)
Alpha-1-Globulin: 0.2 g/dL (ref 0.0–0.4)
Alpha-2-Globulin: 1 g/dL (ref 0.4–1.0)
Beta Globulin: 0.8 g/dL (ref 0.7–1.3)
Gamma Globulin: 0.3 g/dL — ABNORMAL LOW (ref 0.4–1.8)
Globulin, Total: 2.3 g/dL (ref 2.2–3.9)
Total Protein ELP: 5.9 g/dL — ABNORMAL LOW (ref 6.0–8.5)

## 2020-07-16 ENCOUNTER — Other Ambulatory Visit: Payer: Self-pay | Admitting: Psychiatry

## 2020-07-16 ENCOUNTER — Telehealth: Payer: Self-pay | Admitting: Psychiatry

## 2020-07-16 MED ORDER — BREXPIPRAZOLE 4 MG PO TABS: 2.0000 mg | ORAL_TABLET | ORAL | 1 refills | Status: DC

## 2020-07-16 NOTE — Telephone Encounter (Signed)
I did not think to give her the vouchers.  I am sending this message also to the staff to give her the 215-day vouchers.  I will need to send a prescription for 15-day quantities to the pharmacy

## 2020-07-16 NOTE — Telephone Encounter (Signed)
Tiffany Leon called in today stating that she would like to know if there is an alternative for Rexulti. She states it is to expensive and that she can't afford and that she and husband make to much to qualify for medication assistance. She would like a CB to discuss options. Ph: [phone]

## 2020-07-16 NOTE — Telephone Encounter (Signed)
Please review

## 2020-07-16 NOTE — Telephone Encounter (Signed)
FYI, I sent the prescription to the pharmacy to be used with the voucher to be picked up from here.  I wrote a prescription for double strength 4 mg tablets every other day so that she can get 1 months worth of medicine out of the voucher.  Make sure she knows to take the 4 mg Rexulti every other day not daily

## 2020-07-16 NOTE — Telephone Encounter (Signed)
Did you give her the 15 day vouchers at the beginning of her trial? She can use 2 of those I believe and they are free. Obviously just temporary though.

## 2020-07-17 ENCOUNTER — Other Ambulatory Visit: Payer: Self-pay | Admitting: Psychiatry

## 2020-07-17 NOTE — Telephone Encounter (Signed)
Pt will not be able to pick up Rx because she can't afford it and did not get approved for the assistance.She wants to know if there is anything she can take similar to rexulti that won't be as expensive

## 2020-07-17 NOTE — Telephone Encounter (Signed)
Will contact pt in the morning to clarify the vouchers

## 2020-07-17 NOTE — Telephone Encounter (Signed)
I spoke to her earlier because there is another message on her and she didn't know what vouchers I was talking about and I wasn't sure about them either so yes can you please call

## 2020-07-17 NOTE — Telephone Encounter (Signed)
Please review pharmacy note

## 2020-07-17 NOTE — Telephone Encounter (Signed)
Tiffany Leon not sure you are familiar with the vouchers. Sometimes the pharmaceutical reps have coupons that pt's can do a trial of the medication for 15 days. They are in the closet. Pt just needs to pick up the coupon/voucher. The medication is free. This would at least get her through for 30 days then Dr. Clovis Pu can decide from there.   I can call her if you haven't already.

## 2020-07-18 NOTE — Telephone Encounter (Signed)
Tiffany Leon, I will call her about these vouchers. She does have samples now but I will explain those. Thanks

## 2020-07-21 ENCOUNTER — Telehealth: Payer: Self-pay | Admitting: Psychiatry

## 2020-07-21 NOTE — Telephone Encounter (Signed)
Please review pharmacist msg

## 2020-07-21 NOTE — Telephone Encounter (Signed)
Traci there was a message from 4/1,did you get a chance to call her?

## 2020-07-21 NOTE — Telephone Encounter (Signed)
Pt called to check status on alternative for Rexulti.. Too expensive and make too much money for assistance. Pt ask if Adderall would work with meds she's taking. Please advise.  Contact # [phone]. Apt 4/26

## 2020-07-21 NOTE — Telephone Encounter (Signed)
I do need to call her and explain vouchers.   Asking also if Adderall will be good for her?

## 2020-07-22 ENCOUNTER — Other Ambulatory Visit: Payer: Self-pay | Admitting: Psychiatry

## 2020-07-22 MED ORDER — REXULTI 4 MG PO TABS: 4.0000 mg | ORAL_TABLET | Freq: Every day | ORAL | 1 refills | Status: DC

## 2020-07-22 NOTE — Telephone Encounter (Signed)
Pt aware of vouchers

## 2020-07-22 NOTE — Telephone Encounter (Signed)
Rtc to pt and explained the vouchers, she was very appreciative as she is going on vacation and was hoping to make it through.   I discussed the request for Adderall, her daughter in law takes that is why it was suggested but Tiffany Leon's complaints are no energy all day long, no get up and go throughout the day. Feels like she needs something to help her. Informed her I would discuss with Dr. Clovis Pu.    Her next apt is 4/26

## 2020-07-22 NOTE — Telephone Encounter (Signed)
I do not see any pharmacy message unless you mean her request for trying a stimulant.?  I am not going to give her stimulant.  But she will need to call her and tell her that.  We can discuss it at her next appointment.

## 2020-08-12 ENCOUNTER — Encounter: Payer: Self-pay | Admitting: Psychiatry

## 2020-08-12 ENCOUNTER — Other Ambulatory Visit: Payer: Self-pay

## 2020-08-12 ENCOUNTER — Ambulatory Visit (INDEPENDENT_AMBULATORY_CARE_PROVIDER_SITE_OTHER): Payer: Medicare Other | Admitting: Psychiatry

## 2020-08-12 DIAGNOSIS — F332 Major depressive disorder, recurrent severe without psychotic features: Secondary | ICD-10-CM

## 2020-08-12 DIAGNOSIS — F5105 Insomnia due to other mental disorder: Secondary | ICD-10-CM

## 2020-08-12 DIAGNOSIS — F4001 Agoraphobia with panic disorder: Secondary | ICD-10-CM

## 2020-08-12 MED ORDER — DULOXETINE HCL 30 MG PO CPEP
ORAL_CAPSULE | ORAL | 1 refills | Status: DC
Start: 2020-08-12 — End: 2020-08-21

## 2020-08-12 NOTE — Patient Instructions (Signed)
Reduce paroxetine to 1 tablet daily which equals 40 mg. Start duloxetine (Cymbalta) 30 mg 1 capsule daily
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
## 2020-08-19 ENCOUNTER — Other Ambulatory Visit: Payer: Self-pay | Admitting: Psychiatry

## 2020-08-19 DIAGNOSIS — F4001 Agoraphobia with panic disorder: Secondary | ICD-10-CM

## 2020-08-19 DIAGNOSIS — F332 Major depressive disorder, recurrent severe without psychotic features: Secondary | ICD-10-CM

## 2020-09-10 ENCOUNTER — Inpatient Hospital Stay (HOSPITAL_BASED_OUTPATIENT_CLINIC_OR_DEPARTMENT_OTHER): Payer: Medicare Other | Admitting: Hematology & Oncology

## 2020-09-10 ENCOUNTER — Other Ambulatory Visit: Payer: Self-pay

## 2020-09-10 ENCOUNTER — Inpatient Hospital Stay: Payer: Medicare Other

## 2020-09-10 ENCOUNTER — Inpatient Hospital Stay: Payer: Medicare Other | Attending: Hematology & Oncology

## 2020-09-10 ENCOUNTER — Telehealth: Payer: Self-pay

## 2020-09-10 ENCOUNTER — Encounter: Payer: Self-pay | Admitting: Hematology & Oncology

## 2020-09-10 VITALS — BP 123/57 | HR 68 | Temp 97.9°F | Resp 17

## 2020-09-10 VITALS — BP 122/60 | HR 69 | Temp 98.5°F | Resp 18 | Ht 65.5 in | Wt 131.0 lb

## 2020-09-10 DIAGNOSIS — Z79899 Other long term (current) drug therapy: Secondary | ICD-10-CM | POA: Diagnosis not present

## 2020-09-10 DIAGNOSIS — C880 Waldenstrom macroglobulinemia: Secondary | ICD-10-CM

## 2020-09-10 DIAGNOSIS — D649 Anemia, unspecified: Secondary | ICD-10-CM | POA: Diagnosis not present

## 2020-09-10 DIAGNOSIS — Z5112 Encounter for antineoplastic immunotherapy: Secondary | ICD-10-CM | POA: Insufficient documentation

## 2020-09-10 DIAGNOSIS — N189 Chronic kidney disease, unspecified: Secondary | ICD-10-CM | POA: Diagnosis not present

## 2020-09-10 DIAGNOSIS — Z95828 Presence of other vascular implants and grafts: Secondary | ICD-10-CM

## 2020-09-10 LAB — CMP (CANCER CENTER ONLY)
ALT: 16 U/L (ref 0–44)
AST: 18 U/L (ref 15–41)
Albumin: 4.2 g/dL (ref 3.5–5.0)
Alkaline Phosphatase: 70 U/L (ref 38–126)
Anion gap: 8 (ref 5–15)
BUN: 20 mg/dL (ref 8–23)
CO2: 26 mmol/L (ref 22–32)
Calcium: 9.8 mg/dL (ref 8.9–10.3)
Chloride: 105 mmol/L (ref 98–111)
Creatinine: 0.96 mg/dL (ref 0.44–1.00)
GFR, Estimated: >60 mL/min (ref >60)
Glucose, Bld: 92 mg/dL (ref 70–99)
Potassium: 4 mmol/L (ref 3.5–5.1)
Sodium: 139 mmol/L (ref 135–145)
Total Bilirubin: 0.3 mg/dL (ref 0.3–1.2)
Total Protein: 6.1 g/dL — ABNORMAL LOW (ref 6.5–8.1)

## 2020-09-10 LAB — CBC WITH DIFFERENTIAL (CANCER CENTER ONLY)
Abs Immature Granulocytes: 0.28 10*3/uL — ABNORMAL HIGH (ref 0.00–0.07)
Basophils Absolute: 0 10*3/uL (ref 0.0–0.1)
Basophils Relative: 0 %
Eosinophils Absolute: 0.1 10*3/uL (ref 0.0–0.5)
Eosinophils Relative: 1 %
HCT: 36.5 % (ref 36.0–46.0)
Hemoglobin: 12.1 g/dL (ref 12.0–15.0)
Immature Granulocytes: 5 %
Lymphocytes Relative: 27 %
Lymphs Abs: 1.4 10*3/uL (ref 0.7–4.0)
MCH: 31.6 pg (ref 26.0–34.0)
MCHC: 33.2 g/dL (ref 30.0–36.0)
MCV: 95.3 fL (ref 80.0–100.0)
Monocytes Absolute: 0.4 10*3/uL (ref 0.1–1.0)
Monocytes Relative: 7 %
Neutro Abs: 3.1 10*3/uL (ref 1.7–7.7)
Neutrophils Relative %: 60 %
Platelet Count: 270 10*3/uL (ref 150–400)
RBC: 3.83 MIL/uL — ABNORMAL LOW (ref 3.87–5.11)
RDW: 12.7 % (ref 11.5–15.5)
WBC Count: 5.2 10*3/uL (ref 4.0–10.5)
nRBC: 0 % (ref 0.0–0.2)

## 2020-09-10 MED ORDER — SODIUM CHLORIDE 0.9% FLUSH
10.0000 mL | INTRAVENOUS | Status: DC | PRN
  Administered 2020-09-10: 10 mL
  Filled 2020-09-10: qty 10

## 2020-09-10 MED ORDER — RITUXIMAB-PVVR CHEMO 500 MG/50ML IV SOLN
375.0000 mg/m2 | Freq: Once | INTRAVENOUS | Status: AC
  Administered 2020-09-10: 600 mg via INTRAVENOUS
  Filled 2020-09-10: qty 50

## 2020-09-10 MED ORDER — HEPARIN SOD (PORK) LOCK FLUSH 100 UNIT/ML IV SOLN
500.0000 [IU] | Freq: Once | INTRAVENOUS | Status: AC | PRN
  Administered 2020-09-10: 500 [IU]
  Filled 2020-09-10: qty 5

## 2020-09-10 MED ORDER — SODIUM CHLORIDE 0.9 % IV SOLN
Freq: Once | INTRAVENOUS | Status: AC
  Filled 2020-09-10: qty 250

## 2020-09-10 MED ORDER — SODIUM CHLORIDE 0.9% FLUSH
10.0000 mL | Freq: Once | INTRAVENOUS | Status: AC
Start: 2020-09-10 — End: 2020-09-10
  Administered 2020-09-10: 10 mL via INTRAVENOUS
  Filled 2020-09-10: qty 10

## 2020-09-10 MED ORDER — DIPHENHYDRAMINE HCL 25 MG PO CAPS
50.0000 mg | ORAL_CAPSULE | Freq: Once | ORAL | Status: AC
  Administered 2020-09-10: 50 mg via ORAL

## 2020-09-10 MED ORDER — ACETAMINOPHEN 325 MG PO TABS
ORAL_TABLET | ORAL | Status: AC
  Filled 2020-09-10: qty 2

## 2020-09-10 MED ORDER — ACETAMINOPHEN 325 MG PO TABS
650.0000 mg | ORAL_TABLET | Freq: Once | ORAL | Status: AC
  Administered 2020-09-10: 650 mg via ORAL

## 2020-09-10 MED ORDER — DIPHENHYDRAMINE HCL 25 MG PO CAPS
ORAL_CAPSULE | ORAL | Status: AC
  Filled 2020-09-10: qty 2

## 2020-09-10 NOTE — Telephone Encounter (Signed)
appts made per 09/10/20 Tiffany Leon and pt will gain sch in chemo   [name]

## 2020-09-10 NOTE — Patient Instructions (Signed)
[facility] Discharge Instructions for Patients Receiving Chemotherapy  Today you received the following chemotherapy agents Rituxan  To help prevent nausea and vomiting after your treatment, we encourage you to take your nausea medication as prescribed by MD.   If you develop nausea and vomiting that is not controlled by your nausea medication, call the clinic.   BELOW ARE SYMPTOMS THAT SHOULD BE REPORTED IMMEDIATELY:  *FEVER GREATER THAN 100.5 F  *CHILLS WITH OR WITHOUT FEVER  NAUSEA AND VOMITING THAT IS NOT CONTROLLED WITH YOUR NAUSEA MEDICATION  *UNUSUAL SHORTNESS OF BREATH  *UNUSUAL BRUISING OR BLEEDING  TENDERNESS IN MOUTH AND THROAT WITH OR WITHOUT PRESENCE OF ULCERS  *URINARY PROBLEMS  *BOWEL PROBLEMS  UNUSUAL RASH Items with * indicate a potential emergency and should be followed up as soon as possible.  Feel free to call the clinic should you have any questions or concerns. The clinic phone number is [phone].  Please show the [place] at check-in to the Emergency Department and triage nurse.

## 2020-09-10 NOTE — Patient Instructions (Signed)
Tunneled Central Venous Catheter Flushing Guide  It is important to flush your tunneled central venous catheter each time you use it, both before and after you use it. Flushing your catheter will help prevent it from clogging. What are the risks? Risks may include:  Infection.  Air getting into the catheter and bloodstream. Supplies needed:  A clean pair of gloves.  A disinfecting wipe. Use an alcohol wipe, chlorhexidine wipe, or iodine wipe as told by your health care provider.  A 10 mL syringe that has been prefilled with saline solution.  An empty 10 mL syringe, if a substance called heparin was injected into your catheter. How to flush your catheter When you flush your catheter, make sure you follow any specific instructions from your health care provider or the manufacturer. These are general guidelines. Flushing your catheter before use If there is heparin in your catheter: 1. Wash your hands with soap and water. 2. Put on gloves. 3. Scrub the injection cap for a minimum of 15 seconds with a disinfecting wipe. 4. Unclamp the catheter. 5. Attach the empty syringe to the injection cap. 6. Pull the syringe plunger back and withdraw 10 mL of blood. 7. Place the syringe into an appropriate waste container. 8. Scrub the injection cap for 15 seconds with a disinfecting wipe. 9. Attach the prefilled syringe to the injection cap. 10. Flush the catheter by pushing the plunger forward until all the liquid from the syringe is in the catheter. 11. Remove the syringe from the injection cap. 12. Clamp the catheter. If there is no heparin in your catheter: 1. Wash your hands with soap and water. 2. Put on gloves. 3. Scrub the injection cap for 15 seconds with a disinfecting wipe. 4. Unclamp the catheter. 5. Attach the prefilled syringe to the injection cap. 6. Flush the catheter by pushing the plunger forward until 5 mL of the liquid from the syringe is in the catheter. 7. Pull back on  the syringe until you see blood in the catheter. 8. If you have been asked to collect any blood, follow your health care provider's instructions. Otherwise, flush the catheter with the rest of the solution from the syringe. 9. Remove the syringe from the injection cap. 10. Clamp the catheter.   Flushing your catheter after use 1. Wash your hands with soap and water. 2. Put on gloves. 3. Scrub the injection cap for 15 seconds with a disinfecting wipe. 4. Unclamp the catheter. 5. Attach the prefilled syringe to the injection cap. 6. Flush the catheter by pushing the plunger forward until all of the liquid from the syringe is in the catheter. 7. Remove the syringe from the injection cap. 8. Clamp the catheter. Problems and solutions  If blood cannot be completely cleared from the injection cap, you may need to have the injection cap replaced.  If the catheter is difficult to flush, use the pulsing method. The pulsing method involves pushing only a few milliliters of solution into the catheter at a time and pausing between pushes.  If you do not see blood in the catheter when you pull back on the syringe, change your body position, such as by raising your arms above your head. Take a deep breath and cough. Then, pull back on the syringe. If you still do not see blood, flush the catheter with a small amount of solution. Then, change positions again and take a breath or cough. Pull back on the syringe again. If you still do not   see blood, finish flushing the catheter and contact your health care provider. Do not use your catheter until your health care provider says it is okay. General tips  Have someone help you flush your catheter, if possible.  Do not force fluid through your catheter.  Do not use a syringe that is larger or smaller than 10 mL. Using a smaller syringe can make the catheter burst.  Do not use your catheter without flushing it first if it has heparin in it. Contact a health  care provider if:  You cannot see any blood in the catheter when you flush it before using it.  Your catheter is difficult to flush. Get help right away if:  You cannot flush the catheter.  The catheter leaks when you flush it or when there is fluid in it.  There are cracks or breaks in the catheter. Summary  It is important to flush your tunneled central venous catheter each time you use it, both before and after you use it.  Scrub the injection cap for 15 seconds with a disinfecting wipe before and after you flush it.  When you flush your catheter, make sure you follow any specific instructions from your health care provider or the manufacturer.  Get help right away if you cannot flush the catheter. This information is not intended to replace advice given to you by your health care provider. Make sure you discuss any questions you have with your health care provider. Document Revised: 06/14/2019 Document Reviewed: 06/21/2018 Elsevier Patient Education  2021 [employer]  

## 2020-09-10 NOTE — Progress Notes (Signed)
Hematology and Oncology Follow Up Visit  Tiffany Leon [id number] [birth year] 70 y.o. 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 is here today for follow-up.  She looks great.  Her hair is nice and short.  She likes to have her hair short for the summer months.  Since we last saw her back in March, she has had no issues.  She went down to [place] over Easter.  She had a wonderful time down in [place].  It sounds like over Memorial Day weekend, she will spend time with her granddaughter, who is 21 years old.  As far as the Waldenstrom's is concerned, everything looks great there.  There is no monoclonal spike in her blood.  Her last IgM level was 74 mg/dL.  She has had no problems with nausea or vomiting.  There is been no change in bowel or bladder habits.  She has had no cough or shortness of breath.  She has had no leg swelling.  She has had no rashes.  Is been no visual changes.  She has had no headache.  Overall, performance status is ECOG 1.    Medications:  Allergies as of 09/10/2020      Reactions   Codeine Nausea And Vomiting, Other (See Comments)      Medication List       Accurate as of Sep 10, 2020  9:48 AM. If you have any questions, ask your nurse or doctor.        STOP taking these medications   PARoxetine 40 MG tablet Commonly known as: PAXIL Stopped by: [name], MD   Rexulti 4 MG Tabs Generic drug: Brexpiprazole Stopped by: [name], MD     TAKE these medications   aspirin 81 MG chewable tablet Chew 1 tablet (81 mg total) by mouth daily.   atorvastatin 40 MG tablet Commonly known as: LIPITOR Take 1 tablet (40 mg total) by mouth daily  at 6 PM.   carvedilol 12.5 MG tablet Commonly known as: COREG Take 1 tablet (12.5 mg total) by mouth 2 (two) times daily with a meal.   clonazePAM 0.5 MG tablet Commonly known as: KLONOPIN TAKE 1 TABLET BY MOUTH 2 TIMES DAILY.   DULoxetine 30 MG capsule Commonly known as: CYMBALTA Take 2 capsules (60 mg total) by mouth every morning.   famotidine 40 MG tablet Commonly known as: PEPCID Take 1 tablet (40 mg total) by mouth 2 (two) times daily.   folic acid 1 MG tablet Commonly known as: FOLVITE Take 1 tablet (1 mg total) by mouth daily.   levothyroxine 75 MCG tablet Commonly known as: SYNTHROID Take 75 mcg by mouth daily.   potassium chloride 10 MEQ tablet Commonly known as: KLOR-CON Take 1 tablet (10 mEq total) by mouth daily.   prochlorperazine 10 MG tablet Commonly known as: COMPAZINE 1 tablet as needed   traZODone 50 MG tablet Commonly known as: DESYREL Take 1-2 tablets (50-100 mg total) by mouth at bedtime.       Allergies:  Allergies  Allergen Reactions  . Codeine Nausea And Vomiting and Other (See Comments)    Past Medical History, Surgical history, Social history, and Family History were reviewed and updated.  Review of Systems:  Review of Systems  Constitutional: Negative.   HENT: Negative.   Eyes: Negative.   Respiratory: Negative.   Cardiovascular: Negative.   Gastrointestinal: Negative.   Genitourinary: Negative.   Musculoskeletal: Positive for joint pain.  Skin: Negative.   Neurological: Negative.   Endo/Heme/Allergies: Negative.   Psychiatric/Behavioral: Negative.      Physical Exam:  height is 5' 5.5" (1.664 m) and weight is 131 lb (59.4 kg). Her oral temperature is 98.5 F (36.9 C). Her blood pressure is 122/60 and her pulse is 69. Her respiration is 18 and oxygen saturation is 100%.   Wt Readings from Last 3 Encounters:  09/10/20 131 lb (59.4 kg)  07/11/20 133 lb 6.4 oz (60.5 kg)  06/12/20 133 lb (60.3 kg)    Physical  Exam Vitals reviewed.  HENT:     Head: Normocephalic and atraumatic.  Eyes:     Pupils: Pupils are equal, round, and reactive to light.  Cardiovascular:     Rate and Rhythm: Normal rate and regular rhythm.     Heart sounds: Normal heart sounds.  Pulmonary:     Effort: Pulmonary effort is normal.     Breath sounds: Normal breath sounds.  Abdominal:     General: Bowel sounds are normal.     Palpations: Abdomen is soft.  Musculoskeletal:        General: No tenderness or deformity. Normal range of motion.     Cervical back: Normal range of motion.  Lymphadenopathy:     Cervical: No cervical adenopathy.  Skin:    General: Skin is warm and dry.     Findings: No erythema or rash.  Neurological:     Mental Status: She is alert and oriented to person, place, and time.  Psychiatric:        Behavior: Behavior normal.        Thought Content: Thought content normal.        Judgment: Judgment normal.      Lab Results  Component Value Date   WBC 5.2 09/10/2020   HGB 12.1 09/10/2020   HCT 36.5 09/10/2020   MCV 95.3 09/10/2020   PLT 270 09/10/2020   Lab Results  Component Value Date   FERRITIN 261 12/25/2019   IRON 122 12/25/2019   TIBC 270 12/25/2019   UIBC 148 12/25/2019   IRONPCTSAT 45 12/25/2019   Lab Results  Component Value Date   RETICCTPCT 2.2 12/25/2019   RBC 3.83 (L) 09/10/2020   Lab Results  Component Value Date   KPAFRELGTCHN 15.5 07/11/2020   LAMBDASER 11.9 07/11/2020   KAPLAMBRATIO 1.30 07/11/2020   Lab Results  Component Value Date   IGGSERUM 244 (L) 07/11/2020   IGA 64 07/11/2020   IGMSERUM 71 07/11/2020   Lab Results  Component Value Date   TOTALPROTELP 5.9 (L) 07/11/2020   ALBUMINELP 3.6 07/11/2020   A1GS 0.2 07/11/2020   A2GS 1.0 07/11/2020   BETS 0.8 07/11/2020   GAMS 0.3 (L) 07/11/2020   MSPIKE Not Observed 07/11/2020   SPEI Comment 12/25/2019     Chemistry      Component Value Date/Time   NA 139 09/10/2020 0841   K 4.0 09/10/2020  0841   CL 105 09/10/2020 0841   CO2 26 09/10/2020 0841   BUN 20 09/10/2020 0841   CREATININE 0.96 09/10/2020 0841      Component Value Date/Time   CALCIUM 9.8 09/10/2020 0841   ALKPHOS 70 09/10/2020 0841   AST 18 09/10/2020 0841   ALT 16 09/10/2020  3570   BILITOT 0.3 09/10/2020 0841       Impression and Plan: [name] is a very pleasant 70 yo caucasian female with Waldenstrm's macroglobulinemia.  She has responded very well to Rituxan.  We initially had her on Rituxan with Cytoxan but she cannot tolerate the Cytoxan with her blood counts going down.  She is doing well on maintenance Rituxan.  We will go ahead and continue her on Rituxan.  She will finished Rituxan in July.Marland Kitchen  Her hemoglobin is doing quite well.  She has not required any ESA for quite a while.  I am just glad that her quality of life is where she likes it to be.   [name], MD 5/25/20229:48 AM

## 2020-09-11 LAB — IGG, IGA, IGM
IgA: 61 mg/dL — ABNORMAL LOW (ref 64–422)
IgG (Immunoglobin G), Serum: 273 mg/dL — ABNORMAL LOW (ref 586–1602)
IgM (Immunoglobulin M), Srm: 70 mg/dL (ref 26–217)

## 2020-09-11 LAB — KAPPA/LAMBDA LIGHT CHAINS
Kappa free light chain: 12.2 mg/L (ref 3.3–19.4)
Kappa, lambda light chain ratio: 1.08 (ref 0.26–1.65)
Lambda free light chains: 11.3 mg/L (ref 5.7–26.3)

## 2020-09-12 LAB — PROTEIN ELECTROPHORESIS, SERUM, WITH REFLEX
A/G Ratio: 1.6 (ref 0.7–1.7)
Albumin ELP: 3.7 g/dL (ref 2.9–4.4)
Alpha-1-Globulin: 0.2 g/dL (ref 0.0–0.4)
Alpha-2-Globulin: 1 g/dL (ref 0.4–1.0)
Beta Globulin: 0.8 g/dL (ref 0.7–1.3)
Gamma Globulin: 0.3 g/dL — ABNORMAL LOW (ref 0.4–1.8)
Globulin, Total: 2.3 g/dL (ref 2.2–3.9)
Total Protein ELP: 6 g/dL (ref 6.0–8.5)

## 2020-09-19 ENCOUNTER — Other Ambulatory Visit: Payer: Self-pay | Admitting: Psychiatry

## 2020-09-19 DIAGNOSIS — F4001 Agoraphobia with panic disorder: Secondary | ICD-10-CM

## 2020-09-19 NOTE — Telephone Encounter (Signed)
Last filled 04/28/20 appt on 10/06/20

## 2020-10-06 ENCOUNTER — Ambulatory Visit: Payer: Medicare Other | Admitting: Psychiatry

## 2020-10-06 ENCOUNTER — Other Ambulatory Visit: Payer: Self-pay

## 2020-10-06 ENCOUNTER — Encounter: Payer: Self-pay | Admitting: Psychiatry

## 2020-10-06 DIAGNOSIS — F332 Major depressive disorder, recurrent severe without psychotic features: Secondary | ICD-10-CM

## 2020-10-06 DIAGNOSIS — F4001 Agoraphobia with panic disorder: Secondary | ICD-10-CM | POA: Diagnosis not present

## 2020-10-06 DIAGNOSIS — F5105 Insomnia due to other mental disorder: Secondary | ICD-10-CM

## 2020-10-06 MED ORDER — DULOXETINE HCL 60 MG PO CPEP: 60.0000 mg | ORAL_CAPSULE | Freq: Every morning | ORAL | 1 refills | Status: DC

## 2020-10-06 MED ORDER — TRAZODONE HCL 50 MG PO TABS: 50.0000 mg | ORAL_TABLET | Freq: Every day | ORAL | 1 refills | Status: DC
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
## 2020-11-10 ENCOUNTER — Inpatient Hospital Stay: Payer: Medicare Other

## 2020-11-10 ENCOUNTER — Inpatient Hospital Stay: Payer: Medicare Other | Attending: Hematology & Oncology

## 2020-11-10 ENCOUNTER — Inpatient Hospital Stay (HOSPITAL_BASED_OUTPATIENT_CLINIC_OR_DEPARTMENT_OTHER): Payer: Medicare Other | Admitting: Hematology & Oncology

## 2020-11-10 ENCOUNTER — Other Ambulatory Visit: Payer: Self-pay

## 2020-11-10 ENCOUNTER — Encounter: Payer: Self-pay | Admitting: Hematology & Oncology

## 2020-11-10 VITALS — BP 121/73 | HR 80 | Temp 98.4°F | Resp 18 | Ht 65.5 in | Wt 133.0 lb

## 2020-11-10 VITALS — BP 113/80 | HR 75 | Temp 98.2°F | Resp 18

## 2020-11-10 DIAGNOSIS — Z79899 Other long term (current) drug therapy: Secondary | ICD-10-CM | POA: Diagnosis not present

## 2020-11-10 DIAGNOSIS — N189 Chronic kidney disease, unspecified: Secondary | ICD-10-CM | POA: Insufficient documentation

## 2020-11-10 DIAGNOSIS — Z5112 Encounter for antineoplastic immunotherapy: Secondary | ICD-10-CM | POA: Insufficient documentation

## 2020-11-10 DIAGNOSIS — C880 Waldenstrom macroglobulinemia: Secondary | ICD-10-CM | POA: Diagnosis present

## 2020-11-10 LAB — CBC WITH DIFFERENTIAL (CANCER CENTER ONLY)
Abs Immature Granulocytes: 0.08 10*3/uL — ABNORMAL HIGH (ref 0.00–0.07)
Basophils Absolute: 0 10*3/uL (ref 0.0–0.1)
Basophils Relative: 0 %
Eosinophils Absolute: 0 10*3/uL (ref 0.0–0.5)
Eosinophils Relative: 1 %
HCT: 36 % (ref 36.0–46.0)
Hemoglobin: 12 g/dL (ref 12.0–15.0)
Immature Granulocytes: 2 %
Lymphocytes Relative: 29 %
Lymphs Abs: 1.4 10*3/uL (ref 0.7–4.0)
MCH: 32.3 pg (ref 26.0–34.0)
MCHC: 33.3 g/dL (ref 30.0–36.0)
MCV: 97 fL (ref 80.0–100.0)
Monocytes Absolute: 0.3 10*3/uL (ref 0.1–1.0)
Monocytes Relative: 7 %
Neutro Abs: 2.9 10*3/uL (ref 1.7–7.7)
Neutrophils Relative %: 61 %
Platelet Count: 233 10*3/uL (ref 150–400)
RBC: 3.71 MIL/uL — ABNORMAL LOW (ref 3.87–5.11)
RDW: 13.9 % (ref 11.5–15.5)
WBC Count: 4.7 10*3/uL (ref 4.0–10.5)
nRBC: 0 % (ref 0.0–0.2)

## 2020-11-10 LAB — CMP (CANCER CENTER ONLY)
ALT: 15 U/L (ref 0–44)
AST: 17 U/L (ref 15–41)
Albumin: 4.1 g/dL (ref 3.5–5.0)
Alkaline Phosphatase: 74 U/L (ref 38–126)
Anion gap: 8 (ref 5–15)
BUN: 14 mg/dL (ref 8–23)
CO2: 27 mmol/L (ref 22–32)
Calcium: 9.4 mg/dL (ref 8.9–10.3)
Chloride: 106 mmol/L (ref 98–111)
Creatinine: 0.87 mg/dL (ref 0.44–1.00)
GFR, Estimated: >60 mL/min (ref >60)
Glucose, Bld: 97 mg/dL (ref 70–99)
Potassium: 3.9 mmol/L (ref 3.5–5.1)
Sodium: 141 mmol/L (ref 135–145)
Total Bilirubin: 0.4 mg/dL (ref 0.3–1.2)
Total Protein: 6.1 g/dL — ABNORMAL LOW (ref 6.5–8.1)

## 2020-11-10 LAB — LACTATE DEHYDROGENASE: LDH: 157 U/L (ref 98–192)

## 2020-11-10 MED ORDER — DIPHENHYDRAMINE HCL 25 MG PO CAPS
ORAL_CAPSULE | ORAL | Status: AC
  Filled 2020-11-10: qty 2

## 2020-11-10 MED ORDER — ACETAMINOPHEN 325 MG PO TABS
650.0000 mg | ORAL_TABLET | Freq: Once | ORAL | Status: AC
  Administered 2020-11-10: 650 mg via ORAL

## 2020-11-10 MED ORDER — DIPHENHYDRAMINE HCL 25 MG PO CAPS
50.0000 mg | ORAL_CAPSULE | Freq: Once | ORAL | Status: AC
  Administered 2020-11-10: 50 mg via ORAL

## 2020-11-10 MED ORDER — SODIUM CHLORIDE 0.9% FLUSH
10.0000 mL | INTRAVENOUS | Status: DC | PRN
Start: 2020-11-10 — End: 2020-11-10
  Administered 2020-11-10: 10 mL
  Filled 2020-11-10: qty 10

## 2020-11-10 MED ORDER — SODIUM CHLORIDE 0.9 % IV SOLN
Freq: Once | INTRAVENOUS | Status: AC
  Filled 2020-11-10: qty 250

## 2020-11-10 MED ORDER — ACETAMINOPHEN 325 MG PO TABS
ORAL_TABLET | ORAL | Status: AC
  Filled 2020-11-10: qty 2

## 2020-11-10 MED ORDER — SODIUM CHLORIDE 0.9 % IV SOLN
375.0000 mg/m2 | Freq: Once | INTRAVENOUS | Status: AC
  Administered 2020-11-10: 600 mg via INTRAVENOUS
  Filled 2020-11-10: qty 50

## 2020-11-10 MED ORDER — HEPARIN SOD (PORK) LOCK FLUSH 100 UNIT/ML IV SOLN
500.0000 [IU] | Freq: Once | INTRAVENOUS | Status: AC | PRN
  Administered 2020-11-10: 500 [IU]
  Filled 2020-11-10: qty 5

## 2020-11-10 NOTE — Progress Notes (Signed)
Hematology and Oncology Follow Up Visit  Tiffany Leon [id number] Jul 28, 1950 70 y.o. 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  She is doing quite well.  She just got back from the beach.  She had a wonderful time at the beach despite the hot weather there.  This is her last dose of Rituxan.  I am so excited for her.  She really has shown an incredible amount of improvement since we first saw her 2 years ago.  She has had no problems with infections.  There is been no problems with bleeding.  She has had no change in bowel or bladder habits.  There is no monoclonal spike in her blood when we saw her back in May.  Her IgM level was 70 mg/dL.  She has had no issues with nausea or vomiting.  She has had no rashes.  She has had no bleeding.  She has had no cough or shortness of breath.  Overall, performance status is ECOG 1.  Medications:  Allergies as of 11/10/2020       Reactions   Codeine Nausea And Vomiting        Medication List        Accurate as of November 10, 2020  9:21 AM. If you have any questions, ask your nurse or doctor.          aspirin 81 MG chewable tablet Chew 1 tablet (81 mg total) by mouth daily.   atorvastatin 40 MG tablet Commonly known as: LIPITOR Take 1 tablet (40 mg total) by mouth daily at 6 PM.   carvedilol 12.5 MG tablet Commonly known as: COREG Take 1 tablet (12.5 mg total) by mouth 2 (two) times daily with a meal.   clonazePAM 0.5 MG tablet Commonly known as: KLONOPIN TAKE 1 TABLET BY MOUTH TWICE A DAY   DULoxetine 60 MG capsule Commonly known as: CYMBALTA Take 1 capsule (60 mg total) by mouth every  morning.   famotidine 40 MG tablet Commonly known as: PEPCID Take 1 tablet (40 mg total) by mouth 2 (two) times daily.   folic acid 1 MG tablet Commonly known as: FOLVITE Take 1 tablet (1 mg total) by mouth daily.   levothyroxine 75 MCG tablet Commonly known as: SYNTHROID Take 75 mcg by mouth daily.   potassium chloride 10 MEQ tablet Commonly known as: KLOR-CON Take 1 tablet (10 mEq total) by mouth daily.   prochlorperazine 10 MG tablet Commonly known as: COMPAZINE 1 tablet as needed   traZODone 50 MG tablet Commonly known as: DESYREL Take 1-2 tablets (50-100 mg total) by mouth at bedtime.        Allergies:  Allergies  Allergen Reactions   Codeine Nausea And Vomiting    Past Medical History, Surgical history, Social history, and Family History were reviewed and updated.  Review of Systems: Review of Systems  Constitutional: Negative.   HENT: Negative.    Eyes: Negative.   Respiratory: Negative.    Cardiovascular: Negative.   Gastrointestinal: Negative.   Genitourinary: Negative.   Musculoskeletal:  Positive for joint pain.  Skin: Negative.   Neurological: Negative.   Endo/Heme/Allergies: Negative.   Psychiatric/Behavioral: Negative.      Physical Exam:  height is 5' 5.5" (1.664 m) and weight is 133 lb (60.3 kg). Her oral temperature is 98.4 F (36.9 C). Her blood pressure is 121/73 and her pulse is 80. Her respiration is 18 and oxygen saturation is 100%.   Wt Readings from Last 3 Encounters:  11/10/20 133 lb (60.3 kg)  09/10/20 131 lb (59.4 kg)  07/11/20 133 lb 6.4 oz (60.5 kg)    Physical Exam Vitals reviewed.  HENT:     Head: Normocephalic and atraumatic.  Eyes:     Pupils: Pupils are equal, round, and reactive to light.  Cardiovascular:     Rate and Rhythm: Normal rate and regular rhythm.     Heart sounds: Normal heart sounds.  Pulmonary:     Effort: Pulmonary effort is normal.     Breath sounds: Normal breath sounds.  Abdominal:      General: Bowel sounds are normal.     Palpations: Abdomen is soft.  Musculoskeletal:        General: No tenderness or deformity. Normal range of motion.     Cervical back: Normal range of motion.  Lymphadenopathy:     Cervical: No cervical adenopathy.  Skin:    General: Skin is warm and dry.     Findings: No erythema or rash.  Neurological:     Mental Status: She is alert and oriented to person, place, and time.  Psychiatric:        Behavior: Behavior normal.        Thought Content: Thought content normal.        Judgment: Judgment normal.     Lab Results  Component Value Date   WBC 4.7 11/10/2020   HGB 12.0 11/10/2020   HCT 36.0 11/10/2020   MCV 97.0 11/10/2020   PLT 233 11/10/2020   Lab Results  Component Value Date   FERRITIN 261 12/25/2019   IRON 122 12/25/2019   TIBC 270 12/25/2019   UIBC 148 12/25/2019   IRONPCTSAT 45 12/25/2019   Lab Results  Component Value Date   RETICCTPCT 2.2 12/25/2019   RBC 3.71 (L) 11/10/2020   Lab Results  Component Value Date   KPAFRELGTCHN 12.2 09/10/2020   LAMBDASER 11.3 09/10/2020   KAPLAMBRATIO 1.08 09/10/2020   Lab Results  Component Value Date   IGGSERUM 273 (L) 09/10/2020   IGA 61 (L) 09/10/2020   IGMSERUM 70 09/10/2020   Lab Results  Component Value Date   TOTALPROTELP 6.0 09/10/2020   ALBUMINELP 3.7 09/10/2020   A1GS 0.2 09/10/2020   A2GS 1.0 09/10/2020   BETS 0.8 09/10/2020   GAMS 0.3 (L) 09/10/2020   MSPIKE Not Observed 09/10/2020   SPEI Comment 12/25/2019     Chemistry      Component Value Date/Time   NA 141 11/10/2020 0841   K 3.9 11/10/2020 0841   CL 106 11/10/2020 0841   CO2 27 11/10/2020 0841   BUN 14 11/10/2020 0841   CREATININE 0.87 11/10/2020 0841      Component Value Date/Time   CALCIUM 9.4 11/10/2020 0841   ALKPHOS 74 11/10/2020 0841   AST 17 11/10/2020 0841   ALT 15 11/10/2020 0841   BILITOT 0.4 11/10/2020 0841       Impression and Plan: Tiffany Leon is a very pleasant 70 yo  caucasian female with Waldenstrm's macroglobulinemia.  She has responded very well to Rituxan.  We initially had her on Rituxan with Cytoxan but she cannot tolerate the Cytoxan with her blood counts going down.  She is  doing well on maintenance Rituxan.  She will finish up her Rituxan today.  At this point, I think we just follow her every 3 or 4 months.  I noted that this Waldenstrom's could always recur.  I am just happy for her.  She really has come such a long way since we first saw her in the hospital.    [name], MD 7/25/20229:21 AM

## 2020-11-10 NOTE — Patient Instructions (Signed)
[facility] Discharge Instructions for Patients Receiving Chemotherapy  Today you received the following chemotherapy agents Rituxan  To help prevent nausea and vomiting after your treatment, we encourage you to take your nausea medication as prescribed by MD.   If you develop nausea and vomiting that is not controlled by your nausea medication, call the clinic.   BELOW ARE SYMPTOMS THAT SHOULD BE REPORTED IMMEDIATELY: *FEVER GREATER THAN 100.5 F *CHILLS WITH OR WITHOUT FEVER NAUSEA AND VOMITING THAT IS NOT CONTROLLED WITH YOUR NAUSEA MEDICATION *UNUSUAL SHORTNESS OF BREATH *UNUSUAL BRUISING OR BLEEDING TENDERNESS IN MOUTH AND THROAT WITH OR WITHOUT PRESENCE OF ULCERS *URINARY PROBLEMS *BOWEL PROBLEMS UNUSUAL RASH Items with * indicate a potential emergency and should be followed up as soon as possible.  Feel free to call the clinic should you have any questions or concerns. The clinic phone number is [phone].  Please show the [facility] at check-in to the Emergency Department and triage nurse.

## 2020-11-10 NOTE — Patient Instructions (Signed)

## 2020-11-11 LAB — KAPPA/LAMBDA LIGHT CHAINS
Kappa free light chain: 10.8 mg/L (ref 3.3–19.4)
Kappa, lambda light chain ratio: 1.06 (ref 0.26–1.65)
Lambda free light chains: 10.2 mg/L (ref 5.7–26.3)

## 2020-11-11 LAB — IGG, IGA, IGM
IgA: 60 mg/dL — ABNORMAL LOW (ref 64–422)
IgG (Immunoglobin G), Serum: 269 mg/dL — ABNORMAL LOW (ref 586–1602)
IgM (Immunoglobulin M), Srm: 68 mg/dL (ref 26–217)

## 2020-11-13 LAB — PROTEIN ELECTROPHORESIS, SERUM, WITH REFLEX
A/G Ratio: 1.6 (ref 0.7–1.7)
Albumin ELP: 3.5 g/dL (ref 2.9–4.4)
Alpha-1-Globulin: 0.2 g/dL (ref 0.0–0.4)
Alpha-2-Globulin: 0.9 g/dL (ref 0.4–1.0)
Beta Globulin: 0.8 g/dL (ref 0.7–1.3)
Gamma Globulin: 0.2 g/dL — ABNORMAL LOW (ref 0.4–1.8)
Globulin, Total: 2.2 g/dL (ref 2.2–3.9)
SPEP Interpretation: 0
Total Protein ELP: 5.7 g/dL — ABNORMAL LOW (ref 6.0–8.5)

## 2020-11-13 LAB — IMMUNOFIXATION REFLEX, SERUM
IgA: 67 mg/dL (ref 64–422)
IgG (Immunoglobin G), Serum: 290 mg/dL — ABNORMAL LOW (ref 586–1602)
IgM (Immunoglobulin M), Srm: 73 mg/dL (ref 26–217)

## 2020-12-24 ENCOUNTER — Other Ambulatory Visit: Payer: Self-pay

## 2020-12-24 ENCOUNTER — Inpatient Hospital Stay: Payer: Medicare Other | Attending: Hematology & Oncology

## 2020-12-24 VITALS — BP 125/68 | HR 75 | Temp 97.8°F | Resp 17

## 2020-12-24 DIAGNOSIS — Z452 Encounter for adjustment and management of vascular access device: Secondary | ICD-10-CM | POA: Insufficient documentation

## 2020-12-24 DIAGNOSIS — C880 Waldenstrom macroglobulinemia: Secondary | ICD-10-CM | POA: Insufficient documentation

## 2020-12-24 DIAGNOSIS — Z95828 Presence of other vascular implants and grafts: Secondary | ICD-10-CM

## 2020-12-24 MED ORDER — HEPARIN SOD (PORK) LOCK FLUSH 100 UNIT/ML IV SOLN
500.0000 [IU] | Freq: Once | INTRAVENOUS | Status: AC
  Administered 2020-12-24: 500 [IU] via INTRAVENOUS

## 2020-12-24 MED ORDER — SODIUM CHLORIDE 0.9% FLUSH
10.0000 mL | Freq: Once | INTRAVENOUS | Status: AC
  Administered 2020-12-24: 10 mL via INTRAVENOUS

## 2020-12-24 NOTE — Patient Instructions (Signed)
Implanted Port Home Guide An implanted port is a device that is placed under the skin. It is usually placed in the chest. The device can be used to give IV medicine, to take blood, or for dialysis. You may have an implanted port if: You need IV medicine that would be irritating to the small veins in your hands or arms. You need IV medicines, such as antibiotics, for a long period of time. You need IV nutrition for a long period of time. You need dialysis. When you have a port, your health care provider can choose to use the port instead of veins in your arms for these procedures. You may have fewer limitations when using a port than you would if you used other types of long-term IVs, and you will likely be able to return to normal activities after your incision heals. An implanted port has two main parts: Reservoir. The reservoir is the part where a needle is inserted to give medicines or draw blood. The reservoir is round. After it is placed, it appears as a small, raised area under your skin. Catheter. The catheter is a thin, flexible tube that connects the reservoir to a vein. Medicine that is inserted into the reservoir goes into the catheter and then into the vein. How is my port accessed? To access your port: A numbing cream may be placed on the skin over the port site. Your health care provider will put on a mask and sterile gloves. The skin over your port will be cleaned carefully with a germ-killing soap and allowed to dry. Your health care provider will gently pinch the port and insert a needle into it. Your health care provider will check for a blood return to make sure the port is in the vein and is not clogged. If your port needs to remain accessed to get medicine continuously (constant infusion), your health care provider will place a clear bandage (dressing) over the needle site. The dressing and needle will need to be changed every week, or as told by your health care provider. What  is flushing? Flushing helps keep the port from getting clogged. Follow instructions from your health care provider about how and when to flush the port. Ports are usually flushed with saline solution or a medicine called heparin. The need for flushing will depend on how the port is used: If the port is only used from time to time to give medicines or draw blood, the port may need to be flushed: Before and after medicines have been given. Before and after blood has been drawn. As part of routine maintenance. Flushing may be recommended every 4-6 weeks. If a constant infusion is running, the port may not need to be flushed. Throw away any syringes in a disposal container that is meant for sharp items (sharps container). You can buy a sharps container from a pharmacy, or you can make one by using an empty hard plastic bottle with a cover. How long will my port stay implanted? The port can stay in for as long as your health care provider thinks it is needed. When it is time for the port to come out, a surgery will be done to remove it. The surgery will be similar to the procedure that was done to put the port in. Follow these instructions at home:  Flush your port as told by your health care provider. If you need an infusion over several days, follow instructions from your health care provider about how   to take care of your port site. Make sure you: Wash your hands with soap and water before you change your dressing. If soap and water are not available, use alcohol-based hand sanitizer. Change your dressing as told by your health care provider. Place any used dressings or infusion bags into a plastic bag. Throw that bag in the trash. Keep the dressing that covers the needle clean and dry. Do not get it wet. Do not use scissors or sharp objects near the tube. Keep the tube clamped, unless it is being used. Check your port site every day for signs of infection. Check for: Redness, swelling, or  pain. Fluid or blood. Pus or a bad smell. Protect the skin around the port site. Avoid wearing bra straps that rub or irritate the site. Protect the skin around your port from seat belts. Place a soft pad over your chest if needed. Bathe or shower as told by your health care provider. The site may get wet as long as you are not actively receiving an infusion. Return to your normal activities as told by your health care provider. Ask your health care provider what activities are safe for you. Carry a medical alert card or wear a medical alert bracelet at all times. This will let health care providers know that you have an implanted port in case of an emergency. Get help right away if: You have redness, swelling, or pain at the port site. You have fluid or blood coming from your port site. You have pus or a bad smell coming from the port site. You have a fever. Summary Implanted ports are usually placed in the chest for long-term IV access. Follow instructions from your health care provider about flushing the port and changing bandages (dressings). Take care of the area around your port by avoiding clothing that puts pressure on the area, and by watching for signs of infection. Protect the skin around your port from seat belts. Place a soft pad over your chest if needed. Get help right away if you have a fever or you have redness, swelling, pain, drainage, or a bad smell at the port site. This information is not intended to replace advice given to you by your health care provider. Make sure you discuss any questions you have with your health care provider. Document Revised: 06/25/2020 Document Reviewed: 08/20/2019 [employer] Patient Education  2022 [employer]  

## 2021-01-16 ENCOUNTER — Emergency Department (HOSPITAL_COMMUNITY)
Admission: EM | Admit: 2021-01-16 | Discharge: 2021-01-16 | Disposition: A | Discharge status: Home / Self Care | Payer: Medicare Other | Attending: Emergency Medicine | Admitting: Emergency Medicine

## 2021-01-16 ENCOUNTER — Other Ambulatory Visit: Payer: Self-pay

## 2021-01-16 ENCOUNTER — Emergency Department (HOSPITAL_COMMUNITY): Payer: Medicare Other

## 2021-01-16 ENCOUNTER — Other Ambulatory Visit: Payer: Self-pay | Admitting: Internal Medicine

## 2021-01-16 DIAGNOSIS — I5042 Chronic combined systolic (congestive) and diastolic (congestive) heart failure: Secondary | ICD-10-CM | POA: Diagnosis not present

## 2021-01-16 DIAGNOSIS — U071 COVID-19: Secondary | ICD-10-CM | POA: Insufficient documentation

## 2021-01-16 DIAGNOSIS — R109 Unspecified abdominal pain: Secondary | ICD-10-CM | POA: Diagnosis not present

## 2021-01-16 DIAGNOSIS — J449 Chronic obstructive pulmonary disease, unspecified: Secondary | ICD-10-CM | POA: Insufficient documentation

## 2021-01-16 DIAGNOSIS — R059 Cough, unspecified: Secondary | ICD-10-CM | POA: Diagnosis present

## 2021-01-16 DIAGNOSIS — N3001 Acute cystitis with hematuria: Secondary | ICD-10-CM | POA: Diagnosis not present

## 2021-01-16 DIAGNOSIS — Z79899 Other long term (current) drug therapy: Secondary | ICD-10-CM | POA: Diagnosis not present

## 2021-01-16 DIAGNOSIS — I130 Hypertensive heart and chronic kidney disease with heart failure and stage 1 through stage 4 chronic kidney disease, or unspecified chronic kidney disease: Secondary | ICD-10-CM | POA: Insufficient documentation

## 2021-01-16 DIAGNOSIS — F17210 Nicotine dependence, cigarettes, uncomplicated: Secondary | ICD-10-CM | POA: Insufficient documentation

## 2021-01-16 DIAGNOSIS — N183 Chronic kidney disease, stage 3 unspecified: Secondary | ICD-10-CM | POA: Insufficient documentation

## 2021-01-16 DIAGNOSIS — R0981 Nasal congestion: Secondary | ICD-10-CM | POA: Diagnosis not present

## 2021-01-16 DIAGNOSIS — Z8616 Personal history of COVID-19: Secondary | ICD-10-CM

## 2021-01-16 HISTORY — DX: Personal history of COVID-19: Z86.16

## 2021-01-16 LAB — URINALYSIS, ROUTINE W REFLEX MICROSCOPIC
Bilirubin Urine: NEGATIVE
Glucose, UA: NEGATIVE mg/dL
Ketones, ur: NEGATIVE mg/dL
Nitrite: POSITIVE — AB
Protein, ur: 100 mg/dL — AB
Specific Gravity, Urine: 1.026 (ref 1.005–1.030)
WBC, UA: >50 WBC/hpf — ABNORMAL HIGH (ref 0–5)
pH: 5 (ref 5.0–8.0)

## 2021-01-16 LAB — CBC WITH DIFFERENTIAL/PLATELET
Abs Immature Granulocytes: 0.35 10*3/uL — ABNORMAL HIGH (ref 0.00–0.07)
Basophils Absolute: 0 10*3/uL (ref 0.0–0.1)
Basophils Relative: 0 %
Eosinophils Absolute: 0 10*3/uL (ref 0.0–0.5)
Eosinophils Relative: 1 %
HCT: 34.6 % — ABNORMAL LOW (ref 36.0–46.0)
Hemoglobin: 11.2 g/dL — ABNORMAL LOW (ref 12.0–15.0)
Immature Granulocytes: 6 %
Lymphocytes Relative: 21 %
Lymphs Abs: 1.1 10*3/uL (ref 0.7–4.0)
MCH: 32.4 pg (ref 26.0–34.0)
MCHC: 32.4 g/dL (ref 30.0–36.0)
MCV: 100 fL (ref 80.0–100.0)
Monocytes Absolute: 0.6 10*3/uL (ref 0.1–1.0)
Monocytes Relative: 11 %
Neutro Abs: 3.4 10*3/uL (ref 1.7–7.7)
Neutrophils Relative %: 61 %
Platelets: 236 10*3/uL (ref 150–400)
RBC: 3.46 MIL/uL — ABNORMAL LOW (ref 3.87–5.11)
RDW: 13.1 % (ref 11.5–15.5)
WBC: 5.5 10*3/uL (ref 4.0–10.5)
nRBC: 0 % (ref 0.0–0.2)

## 2021-01-16 LAB — RESP PANEL BY RT-PCR (FLU A&B, COVID) ARPGX2
Influenza A by PCR: NEGATIVE
Influenza B by PCR: NEGATIVE
SARS Coronavirus 2 by RT PCR: POSITIVE — AB

## 2021-01-16 LAB — COMPREHENSIVE METABOLIC PANEL
ALT: 15 U/L (ref 0–44)
AST: 20 U/L (ref 15–41)
Albumin: 3.7 g/dL (ref 3.5–5.0)
Alkaline Phosphatase: 70 U/L (ref 38–126)
Anion gap: 9 (ref 5–15)
BUN: 20 mg/dL (ref 8–23)
CO2: 22 mmol/L (ref 22–32)
Calcium: 8.8 mg/dL — ABNORMAL LOW (ref 8.9–10.3)
Chloride: 113 mmol/L — ABNORMAL HIGH (ref 98–111)
Creatinine, Ser: 0.95 mg/dL (ref 0.44–1.00)
GFR, Estimated: >60 mL/min (ref >60)
Glucose, Bld: 94 mg/dL (ref 70–99)
Potassium: 3.2 mmol/L — ABNORMAL LOW (ref 3.5–5.1)
Sodium: 144 mmol/L (ref 135–145)
Total Bilirubin: 0.4 mg/dL (ref 0.3–1.2)
Total Protein: 6.6 g/dL (ref 6.5–8.1)

## 2021-01-16 MED ORDER — SODIUM CHLORIDE 0.9 % IV SOLN
1.0000 g | Freq: Once | INTRAVENOUS | Status: AC
  Administered 2021-01-16: 1 g via INTRAVENOUS
  Filled 2021-01-16: qty 10

## 2021-01-16 MED ORDER — NIRMATRELVIR/RITONAVIR (PAXLOVID)TABLET: 3.0000 | ORAL_TABLET | Freq: Two times a day (BID) | ORAL | 0 refills | Status: AC

## 2021-01-16 MED ORDER — CEPHALEXIN 500 MG PO CAPS: 500.0000 mg | ORAL_CAPSULE | Freq: Two times a day (BID) | ORAL | 0 refills | Status: DC

## 2021-01-16 NOTE — ED Provider Notes (Signed)
[facility] DEPT Provider Note   CSN: [id number] Arrival date & time: 01/16/21  1909     History Chief Complaint  Patient presents with   Covid Positive   Fatigue    Tiffany Leon is a 70 y.o. female with a past medical history significant for Waldenstrom macroglobulinemia, COPD, stage III CKD, congestive heart failure who presents with several days of fever, chills, nausea, productive cough, shortness of breath. Symptoms began Monday. Patient took an at home COVID test which was positive.  Patient was prescribed Z-Pak, inhaler by primary care provider today.  Patient used to inhaler x2 with some relief of shortness of breath.  Patient reports the cough is productive of clear mucus.  Patient reports that she had low oxygen saturation at home, lowest was 88% on room air.  Patient is not on any treatment for COPD, is not on any oxygen at baseline.  Patient also complains of issues with an overactive bladder, does complain of dysuria.  Does endorse a history of kidney stones, does report that this does not feel like a kidney stone, he does not have any colicky kidney pain.  HPI     Past Medical History:  Diagnosis Date   Acute kidney injury ([place])    a. 09/2018   Anxiety    Aortic atherosclerosis ([place])    a. 09/2018 noted on Chest CT.   Chronic combined systolic (congestive) and diastolic (congestive) heart failure ([place])    a. 09/2018 Echo: EF 45-50%, diff HK. Triv to small circumfirential pericardial eff.   Chronic DOE (dyspnea on exertion)    Claudication ([place])    a. Bilat hip claudication since ~ 2019.   Depression    Dyspnea    Emphysema lung ([place])    Exertional angina ([place])    a. Ex angina since ~ 2019.   GERD (gastroesophageal reflux disease)    Goals of care, counseling/discussion 11/03/2018   Hiatal hernia    History of bronchitis    History of kidney stones    Hypothyroidism    Pleural effusion    a. 09/2018 s/p thoracentesis-->963ml.    Pleural effusion 09/2018   Rash 10/2018   Rash began Saturday with unknown reason.  MD seen Monday 10/23/18   Resting tremor    Tobacco abuse    Waldenstrom's macroglobulinemia ([place]) 11/03/2018    Patient Active Problem List   Diagnosis Date Noted   Anemia, chronic renal failure 11/06/2018   Goals of care, counseling/discussion 11/03/2018   Waldenstrom's macroglobulinemia ([place]) 11/03/2018   Chest pain 09/28/2018   Abdominal pain 09/28/2018   HLD (hyperlipidemia) 09/28/2018   Depression 12/75/1700   Chronic systolic CHF (congestive heart failure) ([place]) 09/28/2018   Iron deficiency 09/28/2018   SOB (shortness of breath) 09/20/2018   Hypertensive urgency 09/20/2018   CKD (chronic kidney disease), stage III ([place]) 09/20/2018   Nausea and vomiting 09/20/2018   Hypothyroidism 09/20/2018   Pleural effusion 09/20/2018   Tobacco abuse 09/20/2018   Tinea pedis 09/20/2018   COPD (chronic obstructive pulmonary disease) ([place]) 09/20/2018   Nephrolithiasis 09/20/2018   Panic disorder with agoraphobia 05/09/2018    Past Surgical History:  Procedure Laterality Date   BREAST LUMPECTOMY WITH RADIOACTIVE SEED LOCALIZATION Left 12/28/2017   Procedure: BREAST LUMPECTOMY WITH RADIOACTIVE SEED LOCALIZATION;  Surgeon: Jovita Kussmaul, MD;  Location: [facility];  Service: General;  Laterality: Left;   DG THUMB RIGHT HAND ([facility] HX)     IR IMAGING GUIDED PORT INSERTION  11/09/2018  IR THORACENTESIS ASP PLEURAL SPACE W/IMG GUIDE  09/20/2018   KIDNEY STONE SURGERY       OB History   No obstetric history on file.     Family History  Problem Relation Age of Onset   Stroke Mother        Mini-strokes. Died @ 60.   Dementia Mother    Lung cancer Father        Died in his 94's   Addison's disease Sister    Hypertension Brother    CAD Brother    Hypertension Brother     Social History   Tobacco Use   Smoking status: Every Day    Packs/day: 0.50    Years: 40.00    Pack years: 20.00    Types:  Cigarettes   Smokeless tobacco: Never  Vaping Use   Vaping Use: Never used  Substance Use Topics   Alcohol use: Not Currently    Comment: 1-2 gl wine / night - none since 07/2018   Drug use: Not Currently    Comment: prev used drugs ~ 35 yrs ago.    Home Medications Prior to Admission medications   Medication Sig Start Date End Date Taking? Authorizing Provider  cephALEXin (KEFLEX) 500 MG capsule Take 1 capsule (500 mg total) by mouth 2 (two) times daily. 01/16/21  Yes Gissele Narducci H, PA-C  nirmatrelvir/ritonavir EUA (PAXLOVID) 20 x 150 MG & 10 x 100MG  TABS Take 3 tablets by mouth 2 (two) times daily for 5 days. Patient GFR is >60. Take nirmatrelvir (150 mg) two tablets twice daily for 5 days and ritonavir (100 mg) one tablet twice daily for 5 days. 01/16/21 01/21/21 Yes Kartel Wolbert H, PA-C  aspirin 81 MG chewable tablet Chew 1 tablet (81 mg total) by mouth daily. 09/25/18   Georgette Shell, MD  atorvastatin (LIPITOR) 40 MG tablet TAKE 1 TABLET (40 MG TOTAL) BY MOUTH DAILY AT 6 PM. 01/16/21   Hilty, Nadean Corwin, MD  carvedilol (COREG) 12.5 MG tablet Take 1 tablet (12.5 mg total) by mouth 2 (two) times daily with a meal. 04/21/20   Hilty, Nadean Corwin, MD  clonazePAM (KLONOPIN) 0.5 MG tablet TAKE 1 TABLET BY MOUTH TWICE A DAY 09/19/20   Cottle, Billey Co., MD  DULoxetine (CYMBALTA) 60 MG capsule Take 1 capsule (60 mg total) by mouth every morning. 10/06/20   Cottle, Billey Co., MD  famotidine (PEPCID) 40 MG tablet Take 1 tablet (40 mg total) by mouth 2 (two) times daily. 06/22/19   [name], MD  folic acid (FOLVITE) 1 MG tablet Take 1 tablet (1 mg total) by mouth daily. 09/24/18   Georgette Shell, MD  levothyroxine (SYNTHROID) 75 MCG tablet Take 75 mcg by mouth daily. 09/07/18   [provider]  potassium chloride (K-DUR) 10 MEQ tablet Take 1 tablet (10 mEq total) by mouth daily. 10/13/18   Duke, Tami Lin, PA  prochlorperazine (COMPAZINE) 10 MG tablet 1 tablet as  needed    [provider]  traZODone (DESYREL) 50 MG tablet Take 1-2 tablets (50-100 mg total) by mouth at bedtime. 10/06/20   Cottle, Billey Co., MD    Allergies    Codeine  Review of Systems   Review of Systems  Constitutional:  Positive for chills and fever.       Subjective chills / fever  Respiratory:  Positive for shortness of breath.   Cardiovascular:  Negative for chest pain, palpitations and leg swelling.  Gastrointestinal:  Positive for nausea. Negative for abdominal pain, constipation, diarrhea and vomiting.  Genitourinary:  Positive for dysuria and frequency.  All other systems reviewed and are negative.  Physical Exam Updated Vital Signs BP 122/64   Pulse (!) 58   Temp 98 F (36.7 C) (Oral)   Resp (!) 25   Ht 5\' 5"  (1.651 m)   Wt 61.2 kg   SpO2 96%   BMI 22.47 kg/m   Physical Exam Vitals and nursing note reviewed.  Constitutional:      General: She is not in acute distress.    Appearance: Normal appearance.  HENT:     Head: Normocephalic and atraumatic.     Nose: Congestion present.  Eyes:     General:        Right eye: No discharge.        Left eye: No discharge.  Cardiovascular:     Rate and Rhythm: Normal rate and regular rhythm.     Heart sounds: No murmur heard.   No friction rub. No gallop.  Pulmonary:     Effort: Pulmonary effort is normal. No respiratory distress.     Breath sounds: Rhonchi present.     Comments: Rhonchi auscultated right greater than left.  No wheezing. Abdominal:     General: Bowel sounds are normal.     Palpations: Abdomen is soft.     Comments: With some left flank tenderness, but no CVA tenderness.  No rebound, rigidity, guarding.  No tenderness to palpation throughout the rest of the abdomen.  Skin:    General: Skin is warm and dry.     Capillary Refill: Capillary refill takes less than 2 seconds.  Neurological:     Mental Status: She is alert and oriented to person, place, and time.  Psychiatric:         Mood and Affect: Mood normal.        Behavior: Behavior normal.    ED Results / Procedures / Treatments   Labs (all labs ordered are listed, but only abnormal results are displayed) Labs Reviewed  COMPREHENSIVE METABOLIC PANEL - Abnormal; Notable for the following components:      Result Value   Potassium 3.2 (*)    Chloride 113 (*)    Calcium 8.8 (*)    All other components within normal limits  CBC WITH DIFFERENTIAL/PLATELET - Abnormal; Notable for the following components:   RBC 3.46 (*)    Hemoglobin 11.2 (*)    HCT 34.6 (*)    Abs Immature Granulocytes 0.35 (*)    All other components within normal limits  URINALYSIS, ROUTINE W REFLEX MICROSCOPIC - Abnormal; Notable for the following components:   Color, Urine AMBER (*)    APPearance CLOUDY (*)    Hgb urine dipstick SMALL (*)    Protein, ur 100 (*)    Nitrite POSITIVE (*)    Leukocytes,Ua LARGE (*)    WBC, UA >50 (*)    Bacteria, UA MANY (*)    Non Squamous Epithelial 0-5 (*)    All other components within normal limits  RESP PANEL BY RT-PCR (FLU A&B, COVID) [id number]    EKG None  Radiology DG Chest Portable 1 View  Result Date: 01/16/2021 CLINICAL DATA:  Cough. EXAM: PORTABLE CHEST 1 VIEW COMPARISON:  Chest x-ray 09/30/2018. FINDINGS: Right chest port catheter tip projects over the SVC. The heart size and mediastinal contours are within normal limits. Both lungs are clear. There is a healed left ninth rib fracture. IMPRESSION: No  active disease. Electronically Signed   By: Ronney Asters M.D.   On: 01/16/2021 19:55    Procedures Procedures   Medications Ordered in ED Medications  cefTRIAXone (ROCEPHIN) 1 g in sodium chloride 0.9 % 100 mL IVPB (has no administration in time range)    ED Course  I have reviewed the triage vital signs and the nursing notes.  Pertinent labs & imaging results that were available during my care of the patient were reviewed by me and considered in my medical decision making (see chart  for details).    MDM Rules/Calculators/A&P                         I discussed this case with my attending physician who cosigned this note including patient's presenting symptoms, physical exam, and planned diagnostics and interventions. Attending physician stated agreement with plan or made changes to plan which were implemented.   Overall well appearing woman in no acute distress. Patient saturating 93-94% during the course of our interview. CXR clear of infiltrate. No elevation of WBC. Will collect and test UA in context of urinary frequency, and dysuria.  Valuation patient respirating 20/min, oxygen saturation up to 96%.  Patient in no respiratory distress.  Urinalysis significant for UTI, nitrate positive leukocyte positive, hemoglobin small.  Patient has mild left flank pain without CVA tenderness.  We will give her 1 dose of ceftriaxone here, send with Keflex.  In context of Waldenstrom macroglobulinemia, COPD symptoms started Monday, will give Paxlovid.  Patient had reported a history of CKD, however creatinine over the last 6 months is less than 1.  Patient with GFR greater than 60 today.  Recommended patient keep her follow-up with urology to check for UTI resolution, evaluate for chronic urinary frequency.  Patient appears stable for discharge at this time.  Return precautions were given, including worsening shortness of breath, chest pain. Final Clinical Impression(s) / ED Diagnoses Final diagnoses:  Acute cystitis with hematuria  COVID-19    Rx / DC Orders ED Discharge Orders          Ordered    cephALEXin (KEFLEX) 500 MG capsule  2 times daily        01/16/21 2211    nirmatrelvir/ritonavir EUA (PAXLOVID) 20 x 150 MG & 10 x 100MG  TABS  2 times daily        01/16/21 2211             Dorien Chihuahua 01/16/21 2217    Davonna Belling, MD 01/16/21 2340

## 2021-01-16 NOTE — ED Triage Notes (Signed)
Pt came from home via POV. Pt states she's been sick x 1 week. Pt states she thinks home covid test was positive. Pt obtained z pack and inhaler from PCP. Sx: coughing, body aches, fevers, chills, N/D, fatigue. PMH of COPD. Lowest O2 sat at home was 88%. Clear, productive cough

## 2021-01-16 NOTE — ED Provider Notes (Signed)
Emergency Medicine Provider Triage Evaluation Note  Tiffany Leon , a 70 y.o. female  was evaluated in triage.  Pt complains of low oxygen saturation levels at home.  Lowest O2 sat was 88% on room air.  Patient reports history of COPD, does not use any home oxygen.  Patient has been having fevers, chills, generalized myalgias, nausea and productive cough.  Cough is producing clear mucus.  Symptoms started last week.  Patient took home COVID test today which she believes was positive.  Patient was prescribed Z-Pak and inhaler from primary care provider today.  Review of Systems  Positive: Fever, chills, generalized myalgia, nausea, productive cough Negative: Vomiting, abdominal pain, shortness of breath, chest pain, palpitations, leg swelling or tenderness  Physical Exam  BP 115/72 (BP Location: Right Arm)   Pulse 80   Temp 98 F (36.7 C) (Oral)   Resp 20   Ht 5\' 5"  (1.651 m)   Wt 61.2 kg   SpO2 91%   BMI 22.47 kg/m  Gen:   Awake, no distress   Resp:  Normal effort, lungs clear to auscultation bilaterally MSK:   Moves extremities without difficulty; no swelling or tenderness of bilateral lower extremities Other:    Medical Decision Making  Medically screening exam initiated at 7:30 PM.  Appropriate orders placed.  Tiffany Leon was informed that the remainder of the evaluation will be completed by another provider, this initial triage assessment does not replace that evaluation, and the importance of remaining in the ED until their evaluation is complete.  The patient appears stable so that the remainder of the work up may be completed by another provider.      Loni Beckwith, PA-C 01/16/21 1932    Daleen Bo, MD 01/17/21 1106

## 2021-01-16 NOTE — Discharge Instructions (Signed)
Please begin taking your Paxlovid, and Keflex.  We gave you 1 dose of antibiotics tonight.  Return if you begin to have worsening shortness of breath or chest pain.  Please follow-up with the urologist for evaluation if you continue to have urinary frequency even after treating your UTI.

## 2021-01-21 NOTE — Progress Notes (Signed)
01/22/21 9:43 AM   Tiffany Leon [birth year] [id number]  Referring provider:  Aretta Leon, [place],  [place] [address] Chief Complaint  Patient presents with   Over Active Bladder     HPI: Tiffany Leon is a 70 y.o.female who presents today for further evaluation of OAB, back pain, and nausea.   She has a past medical history of Waldenstrom macroglobulinemia, COPD, stage III chronic kidney disease, kidney stones, and congestive heart failure.   She was recently seen and evaluated in ED on 01/16/2021 for fever, chills, nausea, productive cough, shortness of breath. She was diagnosed with acute cystitis with hematuria. Urinalysis showed small blood, nitrite positive, large leukocytes, >50 WBCs, and many bacteria. She was prescribed Keflex. She was also diagnosed with COVID.   She is currently on oxybutynin 10 mg XL and mybetriq 25 mg.   She reports today that she is not doing well, she has a cough today due to COVID. She reports she goes to [facility], they scheduled an appointment for [facility] but due to scheduling mishaps she was told to seek care here. Her urinary symptoms began in early July and she has been on depends. Before July her urinary symptoms consisted only of occasional leakage when she coughs.   She has been experiencing diarrhea and nausea on antibiotics she reports that she does not take this with probiotics.   She was placed on oxybutynin for symptoms but she states this did not show improvement. She was prescribed Myrbetriq but was never actually to get this medication due to the cost.   PMH: Past Medical History:  Diagnosis Date   Acute kidney injury ([place])    a. 09/2018   Anxiety    Aortic atherosclerosis ([place])    a. 09/2018 noted on Chest CT.   Chronic combined systolic (congestive) and diastolic (congestive) heart failure ([place])    a. 09/2018 Echo: EF 45-50%, diff HK. Triv to small circumfirential pericardial eff.   Chronic DOE  (dyspnea on exertion)    Claudication ([place])    a. Bilat hip claudication since ~ 2019.   Depression    Dyspnea    Emphysema lung ([place])    Exertional angina ([place])    a. Ex angina since ~ 2019.   GERD (gastroesophageal reflux disease)    Goals of care, counseling/discussion 11/03/2018   Hiatal hernia    History of bronchitis    History of kidney stones    Hypothyroidism    Pleural effusion    a. 09/2018 s/p thoracentesis-->96ml.   Pleural effusion 09/2018   Rash 10/2018   Rash began Saturday with unknown reason.  MD seen Monday 10/23/18   Resting tremor    Tobacco abuse    Waldenstrom's macroglobulinemia ([place]) 11/03/2018    Surgical History: Past Surgical History:  Procedure Laterality Date   BREAST LUMPECTOMY WITH RADIOACTIVE SEED LOCALIZATION Left 12/28/2017   Procedure: BREAST LUMPECTOMY WITH RADIOACTIVE SEED LOCALIZATION;  Surgeon: Jovita Kussmaul, MD;  Location: [facility];  Service: General;  Laterality: Left;   DG THUMB RIGHT HAND ([facility] HX)     IR IMAGING GUIDED PORT INSERTION  11/09/2018   IR THORACENTESIS ASP PLEURAL SPACE W/IMG GUIDE  09/20/2018   KIDNEY STONE SURGERY      Home Medications:  Allergies as of 01/22/2021       Reactions   Codeine Nausea And Vomiting        Medication List        Accurate as  of January 22, 2021  9:43 AM. If you have any questions, ask your nurse or doctor.          STOP taking these medications    cephALEXin 500 MG capsule Commonly known as: KEFLEX Stopped by: Hollice Espy, MD   famotidine 40 MG tablet Commonly known as: PEPCID Stopped by: Hollice Espy, MD   folic acid 1 MG tablet Commonly known as: FOLVITE Stopped by: Hollice Espy, MD   mirabegron ER 25 MG Tb24 tablet Commonly known as: MYRBETRIQ Stopped by: Hollice Espy, MD   oxybutynin 10 MG 24 hr tablet Commonly known as: DITROPAN-XL Stopped by: Hollice Espy, MD       TAKE these medications    Acetaminophen Extra Strength 500 MG capsule Generic drug:  Acetaminophen 1 capsule as needed   aspirin 81 MG chewable tablet 1 tablet   atorvastatin 40 MG tablet Commonly known as: LIPITOR 1 tablet   carvedilol 12.5 MG tablet Commonly known as: COREG Take 1 tablet (12.5 mg total) by mouth 2 (two) times daily with a meal.   clonazePAM 0.5 MG tablet Commonly known as: KLONOPIN TAKE 1 TABLET BY MOUTH TWICE A DAY   furosemide 40 MG tablet Commonly known as: LASIX 1/2 tab AM, 1/2 tab lunch time   Gemtesa 75 MG Tabs Generic drug: Vibegron Take 75 mg by mouth daily. Started by: Hollice Espy, MD   hydrALAZINE 50 MG tablet Commonly known as: APRESOLINE 2 tablets with food   isosorbide mononitrate 30 MG 24 hr tablet Commonly known as: IMDUR 1 tablet in the morning   levothyroxine 75 MCG tablet Commonly known as: SYNTHROID 1 tablet in the morning on an empty stomach   ondansetron 4 MG disintegrating tablet Commonly known as: ZOFRAN-ODT Take 4 mg by mouth every 6 (six) hours as needed.   PARoxetine 30 MG tablet Commonly known as: PAXIL 2 tablets in the morning   potassium chloride 10 MEQ tablet Commonly known as: KLOR-CON Take 1 tablet (10 mEq total) by mouth daily.   prochlorperazine 10 MG tablet Commonly known as: COMPAZINE 1 tablet as needed   riTUXimab 100 MG/10ML injection Commonly known as: RITUXAN See admin instructions.   traZODone 50 MG tablet Commonly known as: DESYREL Take 1-2 tablets (50-100 mg total) by mouth at bedtime.        Allergies:  Allergies  Allergen Reactions   Codeine Nausea And Vomiting    Family History: Family History  Problem Relation Age of Onset   Stroke Mother        Mini-strokes. Died @ 5.   Dementia Mother    Lung cancer Father        Died in his 27's   Addison's disease Sister    Hypertension Brother    CAD Brother    Hypertension Brother     Social History:  reports that she has been smoking cigarettes. She has a 20.00 pack-year smoking history. She has never  used smokeless tobacco. She reports that she does not currently use alcohol. She reports that she does not currently use drugs.   Physical Exam: BP 138/83   Pulse 75   SpO2 96%   Constitutional:  Alert and oriented, No acute distress. HEENT: [place] AT, moist mucus membranes.  Trachea midline, no masses. Cardiovascular: No clubbing, cyanosis, or edema. Respiratory: Normal respiratory effort, no increased work of breathing. Skin: No rashes, bruises or suspicious lesions. Neurologic: Grossly intact, no focal deficits, moving all 4 extremities. Psychiatric: Normal mood and affect.  Laboratory Data:  Lab Results  Component Value Date   CREATININE 0.95 01/16/2021   Lab Results  Component Value Date   HGBA1C 4.6 (L) 09/28/2018   Results for orders placed or performed in visit on 01/22/21  BLADDER SCAN AMB NON-IMAGING  Result Value Ref Range   Scan Result 0 ml      Assessment & Plan:   Urge incontinence  - Maybe exacerbated by UTI unable to provide urine today.   - recommend cystoscopy to rule out underlying pathology given relatively acute onset - She failed oxybutynin and was prescribed mybetriq but was unable to obtain prescription due to the cost  - Gemtesa sample given today to try in interim   2. Stress incontinence  - Minimal symptoms  - As above   4. History of UTI  - Positive for infection in the ED  - Was unable to recheck urine today  - Will recheck urine during cystoscopy visit  -  UTI prevention supplements such as cranberry tablets, probiotics, yogurt that has active lactobacillus culture, and d-mannose   Follow-up in 3-4 weeks for cystoscopy   I,Kailey Littlejohn,acting as a scribe for Hollice Espy, MD.,have documented all relevant documentation on the behalf of Hollice Espy, MD,as directed by  Hollice Espy, MD while in the presence of Hollice Espy, MD.  I have reviewed the above documentation for accuracy and completeness, and I agree with the above.    Hollice Espy, MD  [facility] [address], [place], [place] [address] [phone]

## 2021-01-22 ENCOUNTER — Encounter: Payer: Self-pay | Admitting: Hematology & Oncology

## 2021-01-22 ENCOUNTER — Encounter: Payer: Self-pay | Admitting: Family

## 2021-01-22 ENCOUNTER — Ambulatory Visit: Payer: Medicare Other | Admitting: Urology

## 2021-01-22 ENCOUNTER — Other Ambulatory Visit: Payer: Self-pay

## 2021-01-22 VITALS — BP 138/83 | HR 75

## 2021-01-22 DIAGNOSIS — N3281 Overactive bladder: Secondary | ICD-10-CM

## 2021-01-22 LAB — BLADDER SCAN AMB NON-IMAGING: Scan Result: 0

## 2021-01-22 MED ORDER — GEMTESA 75 MG PO TABS: 75.0000 mg | ORAL_TABLET | Freq: Every day | ORAL | 0 refills | Status: DC

## 2021-01-22 NOTE — Patient Instructions (Signed)
Cystoscopy Cystoscopy is a procedure that is used to help diagnose and sometimes treat conditions that affect the lower urinary tract. The lower urinary tract includes the bladder and the urethra. The urethra is the tube that drains urine from the bladder. Cystoscopy is done using a thin, tube-shaped instrument with a light and camera at the end (cystoscope). The cystoscope may be hard or flexible, depending on the goal of the procedure. The cystoscope is inserted through the urethra, into the bladder. Cystoscopy may be recommended if you have: Urinary tract infections that keep coming back. Blood in the urine (hematuria). An inability to control when you urinate (urinary incontinence) or an overactive bladder. Unusual cells found in a urine sample. A blockage in the urethra, such as a urinary stone. Painful urination. An abnormality in the bladder found during an intravenous pyelogram (IVP) or CT scan. Cystoscopy may also be done to remove a sample of tissue to be examined under a microscope (biopsy). What are the risks? Generally, this is a safe procedure. However, problems may occur, including: Infection. Bleeding.  What happens during the procedure?  You will be given one or more of the following: A medicine to numb the area (local anesthetic). The area around the opening of your urethra will be cleaned. The cystoscope will be passed through your urethra into your bladder. Germ-free (sterile) fluid will flow through the cystoscope to fill your bladder. The fluid will stretch your bladder so that your health care provider can clearly examine your bladder walls. Your doctor will look at the urethra and bladder. The cystoscope will be removed The procedure may vary among health care providers  What can I expect after the procedure? After the procedure, it is common to have: Some soreness or pain in your abdomen and urethra. Urinary symptoms. These include: Mild pain or burning when you  urinate. Pain should stop within a few minutes after you urinate. This may last for up to 1 week. A small amount of blood in your urine for several days. Feeling like you need to urinate but producing only a small amount of urine. Follow these instructions at home: General instructions Return to your normal activities as told by your health care provider.  Do not drive for 24 hours if you were given a sedative during your procedure. Watch for any blood in your urine. If the amount of blood in your urine increases, call your health care provider. If a tissue sample was removed for testing (biopsy) during your procedure, it is up to you to get your test results. Ask your health care provider, or the department that is doing the test, when your results will be ready. Drink enough fluid to keep your urine pale yellow. Keep all follow-up visits as told by your health care provider. This is important. Contact a health care provider if you: Have pain that gets worse or does not get better with medicine, especially pain when you urinate. Have trouble urinating. Have more blood in your urine. Get help right away if you: Have blood clots in your urine. Have abdominal pain. Have a fever or chills. Are unable to urinate. Summary Cystoscopy is a procedure that is used to help diagnose and sometimes treat conditions that affect the lower urinary tract. Cystoscopy is done using a thin, tube-shaped instrument with a light and camera at the end. After the procedure, it is common to have some soreness or pain in your abdomen and urethra. Watch for any blood in your urine.   If the amount of blood in your urine increases, call your health care provider. If you were prescribed an antibiotic medicine, take it as told by your health care provider. Do not stop taking the antibiotic even if you start to feel better. This information is not intended to replace advice given to you by your health care provider. Make  sure you discuss any questions you have with your health care provider. Document Revised: 03/28/2018 Document Reviewed: 03/28/2018 Elsevier Patient Education  2020 [employer]  

## 2021-01-26 ENCOUNTER — Other Ambulatory Visit: Payer: Self-pay | Admitting: Psychiatry

## 2021-01-26 DIAGNOSIS — F5105 Insomnia due to other mental disorder: Secondary | ICD-10-CM

## 2021-02-03 ENCOUNTER — Telehealth: Payer: Self-pay | Admitting: Hematology & Oncology

## 2021-02-03 ENCOUNTER — Other Ambulatory Visit: Payer: Self-pay

## 2021-02-03 ENCOUNTER — Inpatient Hospital Stay (HOSPITAL_BASED_OUTPATIENT_CLINIC_OR_DEPARTMENT_OTHER): Payer: Medicare Other | Admitting: Hematology & Oncology

## 2021-02-03 ENCOUNTER — Ambulatory Visit: Payer: Medicare Other | Admitting: Hematology & Oncology

## 2021-02-03 ENCOUNTER — Other Ambulatory Visit: Payer: Medicare Other

## 2021-02-03 ENCOUNTER — Encounter: Payer: Self-pay | Admitting: Hematology & Oncology

## 2021-02-03 ENCOUNTER — Inpatient Hospital Stay: Payer: Medicare Other | Attending: Hematology & Oncology

## 2021-02-03 ENCOUNTER — Inpatient Hospital Stay: Payer: Medicare Other

## 2021-02-03 VITALS — BP 121/64 | HR 80 | Temp 97.9°F | Resp 18 | Wt 131.0 lb

## 2021-02-03 DIAGNOSIS — N189 Chronic kidney disease, unspecified: Secondary | ICD-10-CM | POA: Diagnosis not present

## 2021-02-03 DIAGNOSIS — Z95828 Presence of other vascular implants and grafts: Secondary | ICD-10-CM

## 2021-02-03 DIAGNOSIS — Z79899 Other long term (current) drug therapy: Secondary | ICD-10-CM | POA: Diagnosis not present

## 2021-02-03 DIAGNOSIS — D631 Anemia in chronic kidney disease: Secondary | ICD-10-CM | POA: Insufficient documentation

## 2021-02-03 DIAGNOSIS — C880 Waldenstrom macroglobulinemia: Secondary | ICD-10-CM

## 2021-02-03 DIAGNOSIS — N1831 Chronic kidney disease, stage 3a: Secondary | ICD-10-CM

## 2021-02-03 LAB — CBC WITH DIFFERENTIAL (CANCER CENTER ONLY)
Abs Immature Granulocytes: 0.35 10*3/uL — ABNORMAL HIGH (ref 0.00–0.07)
Basophils Absolute: 0 10*3/uL (ref 0.0–0.1)
Basophils Relative: 0 %
Eosinophils Absolute: 0.1 10*3/uL (ref 0.0–0.5)
Eosinophils Relative: 1 %
HCT: 33.8 % — ABNORMAL LOW (ref 36.0–46.0)
Hemoglobin: 11.3 g/dL — ABNORMAL LOW (ref 12.0–15.0)
Immature Granulocytes: 6 %
Lymphocytes Relative: 20 %
Lymphs Abs: 1.1 10*3/uL (ref 0.7–4.0)
MCH: 32.5 pg (ref 26.0–34.0)
MCHC: 33.4 g/dL (ref 30.0–36.0)
MCV: 97.1 fL (ref 80.0–100.0)
Monocytes Absolute: 0.5 10*3/uL (ref 0.1–1.0)
Monocytes Relative: 8 %
Neutro Abs: 3.5 10*3/uL (ref 1.7–7.7)
Neutrophils Relative %: 65 %
Platelet Count: 271 10*3/uL (ref 150–400)
RBC: 3.48 MIL/uL — ABNORMAL LOW (ref 3.87–5.11)
RDW: 13.2 % (ref 11.5–15.5)
WBC Count: 5.4 10*3/uL (ref 4.0–10.5)
nRBC: 0 % (ref 0.0–0.2)

## 2021-02-03 LAB — CMP (CANCER CENTER ONLY)
ALT: 8 U/L (ref 0–44)
AST: 11 U/L — ABNORMAL LOW (ref 15–41)
Albumin: 3.6 g/dL (ref 3.5–5.0)
Alkaline Phosphatase: 85 U/L (ref 38–126)
Anion gap: 8 (ref 5–15)
BUN: 16 mg/dL (ref 8–23)
CO2: 23 mmol/L (ref 22–32)
Calcium: 8.5 mg/dL — ABNORMAL LOW (ref 8.9–10.3)
Chloride: 109 mmol/L (ref 98–111)
Creatinine: 0.76 mg/dL (ref 0.44–1.00)
GFR, Estimated: >60 mL/min (ref >60)
Glucose, Bld: 87 mg/dL (ref 70–99)
Potassium: 3.4 mmol/L — ABNORMAL LOW (ref 3.5–5.1)
Sodium: 140 mmol/L (ref 135–145)
Total Bilirubin: 0.3 mg/dL (ref 0.3–1.2)
Total Protein: 5.6 g/dL — ABNORMAL LOW (ref 6.5–8.1)

## 2021-02-03 LAB — LACTATE DEHYDROGENASE: LDH: 153 U/L (ref 98–192)

## 2021-02-03 MED ORDER — SODIUM CHLORIDE 0.9% FLUSH
10.0000 mL | INTRAVENOUS | Status: AC | PRN
  Administered 2021-02-03: 10 mL via INTRAVENOUS

## 2021-02-03 MED ORDER — HEPARIN SOD (PORK) LOCK FLUSH 100 UNIT/ML IV SOLN
500.0000 [IU] | Freq: Once | INTRAVENOUS | Status: AC
  Administered 2021-02-03: 500 [IU] via INTRAVENOUS

## 2021-02-03 NOTE — Addendum Note (Signed)
Addended by: San Morelle on: 02/03/2021 12:29 PM   Modules accepted: Orders, SmartSet

## 2021-02-03 NOTE — Progress Notes (Signed)
Hematology and Oncology Follow Up Visit  Tiffany Leon [id number] 06-18-1950 70 y.o. 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 is here today for follow-up.  She actually had COVID about 3 weeks ago.  She thinks she got it from her husband.  Thankfully, she was not hospitalized.  She is having urinary issues.  She is having bladder spasms.  She is having bladder hyperactivity.  She is going to see a urologist for this.  When we last saw her in July, there is no monoclonal spike in her blood.  Her IgM level was 73 mg/dL.    She has had no problems with nausea or vomiting.  She has had no change in bowel or bladder habits beside the urinary frequency.  She has had no rashes.  She has had no fever.  She has had no headache.  She is looking forward to the holidays where she can do a lot of holiday cooking.  She has had no leg swelling.  Is been no bleeding.  Overall, I would say her performance status is probably ECOG 1.     Medications:  Allergies as of 02/03/2021       Reactions   Codeine Nausea And Vomiting        Medication List        Accurate as of February 03, 2021 12:58 PM. If you have any questions, ask your nurse or doctor.          Acetaminophen Extra Strength 500 MG capsule Generic drug: Acetaminophen 1 capsule as needed   aspirin 81 MG chewable tablet 1 tablet   atorvastatin 40 MG tablet Commonly known as: LIPITOR 1 tablet   carvedilol 12.5 MG tablet Commonly known as: COREG Take 1 tablet (12.5 mg total) by mouth 2 (two) times daily with a meal.   clonazePAM 0.5 MG tablet Commonly known as: KLONOPIN TAKE 1 TABLET BY  MOUTH TWICE A DAY   furosemide 40 MG tablet Commonly known as: LASIX 1/2 tab AM, 1/2 tab lunch time   Gemtesa 75 MG Tabs Generic drug: Vibegron Take 75 mg by mouth daily.   hydrALAZINE 50 MG tablet Commonly known as: APRESOLINE 2 tablets with food   isosorbide mononitrate 30 MG 24 hr tablet Commonly known as: IMDUR 1 tablet in the morning   levothyroxine 75 MCG tablet Commonly known as: SYNTHROID 1 tablet in the morning on an empty stomach   ondansetron 4 MG disintegrating tablet Commonly known as: ZOFRAN-ODT Take 4 mg by mouth every 6 (six) hours as needed.   PARoxetine 30 MG tablet Commonly known as: PAXIL 2 tablets in the morning   potassium chloride 10 MEQ tablet Commonly known as: KLOR-CON Take 1 tablet (10 mEq total) by mouth daily.   prochlorperazine 10 MG tablet Commonly known as: COMPAZINE 1 tablet as needed   riTUXimab 100 MG/10ML injection Commonly known as: RITUXAN See admin instructions.   traZODone 50 MG tablet Commonly known as: DESYREL Take 1-2 tablets (50-100 mg total) by mouth at bedtime.        Allergies:  Allergies  Allergen Reactions   Codeine Nausea And Vomiting    Past Medical History, Surgical history, Social history, and Family History  were reviewed and updated.  Review of Systems: Review of Systems  Constitutional: Negative.   HENT: Negative.    Eyes: Negative.   Respiratory: Negative.    Cardiovascular: Negative.   Gastrointestinal: Negative.   Genitourinary: Negative.   Musculoskeletal:  Positive for joint pain.  Skin: Negative.   Neurological: Negative.   Endo/Heme/Allergies: Negative.   Psychiatric/Behavioral: Negative.      Physical Exam:  weight is 131 lb (59.4 kg). Her oral temperature is 97.9 F (36.6 C). Her blood pressure is 121/64 and her pulse is 80. Her respiration is 18 and oxygen saturation is 100%.   Wt Readings from Last 3 Encounters:  02/03/21 131 lb (59.4 kg)  01/16/21 135 lb (61.2 kg)   11/10/20 133 lb (60.3 kg)    Physical Exam Vitals reviewed.  HENT:     Head: Normocephalic and atraumatic.  Eyes:     Pupils: Pupils are equal, round, and reactive to light.  Cardiovascular:     Rate and Rhythm: Normal rate and regular rhythm.     Heart sounds: Normal heart sounds.  Pulmonary:     Effort: Pulmonary effort is normal.     Breath sounds: Normal breath sounds.  Abdominal:     General: Bowel sounds are normal.     Palpations: Abdomen is soft.  Musculoskeletal:        General: No tenderness or deformity. Normal range of motion.     Cervical back: Normal range of motion.  Lymphadenopathy:     Cervical: No cervical adenopathy.  Skin:    General: Skin is warm and dry.     Findings: No erythema or rash.  Neurological:     Mental Status: She is alert and oriented to person, place, and time.  Psychiatric:        Behavior: Behavior normal.        Thought Content: Thought content normal.        Judgment: Judgment normal.     Lab Results  Component Value Date   WBC 5.4 02/03/2021   HGB 11.3 (L) 02/03/2021   HCT 33.8 (L) 02/03/2021   MCV 97.1 02/03/2021   PLT 271 02/03/2021   Lab Results  Component Value Date   FERRITIN 261 12/25/2019   IRON 122 12/25/2019   TIBC 270 12/25/2019   UIBC 148 12/25/2019   IRONPCTSAT 45 12/25/2019   Lab Results  Component Value Date   RETICCTPCT 2.2 12/25/2019   RBC 3.48 (L) 02/03/2021   Lab Results  Component Value Date   KPAFRELGTCHN 10.8 11/10/2020   LAMBDASER 10.2 11/10/2020   KAPLAMBRATIO 1.06 11/10/2020   Lab Results  Component Value Date   IGGSERUM 290 (L) 11/10/2020   IGA 67 11/10/2020   IGMSERUM 73 11/10/2020   Lab Results  Component Value Date   TOTALPROTELP 5.7 (L) 11/10/2020   ALBUMINELP 3.5 11/10/2020   A1GS 0.2 11/10/2020   A2GS 0.9 11/10/2020   BETS 0.8 11/10/2020   GAMS 0.2 (L) 11/10/2020   MSPIKE Not Observed 11/10/2020   SPEI Comment 12/25/2019     Chemistry      Component Value  Date/Time   NA 140 02/03/2021 1146   K 3.4 (L) 02/03/2021 1146   CL 109 02/03/2021 1146   CO2 23 02/03/2021 1146   BUN 16 02/03/2021 1146   CREATININE 0.76 02/03/2021 1146      Component Value Date/Time   CALCIUM 8.5 (L) 02/03/2021 1146   ALKPHOS 85 02/03/2021 1146   AST 11 (L) 02/03/2021  1146   ALT 8 02/03/2021 1146   BILITOT 0.3 02/03/2021 1146       Impression and Plan: Ms. Pillard is a very pleasant 70 yo caucasian female with Waldenstrm's macroglobulinemia.  She has responded very well to Rituxan.  We initially had her on Rituxan with Cytoxan but she could not  tolerate the Cytoxan with her blood counts going down.  She did well with the maintenance Rituxan.  We now we will just follow her up.  We will get her through the holidays and get her through Winter.  I know there is always at risk of recurrence.  I still remember when we first saw her how she was in such poor shape.  She really has come about incredibly well.  She has had such great support from her family.  She really persevered.  I am just glad that she is able to enjoy her life.  Hopefully the bladder issue will get resolved.     [name], MD 10/18/202212:58 PM

## 2021-02-03 NOTE — Telephone Encounter (Signed)
Scheduled appt per 10/18 los - mailed letter with appt date and time

## 2021-02-04 LAB — IGG, IGA, IGM
IgA: 64 mg/dL (ref 64–422)
IgG (Immunoglobin G), Serum: 244 mg/dL — ABNORMAL LOW (ref 586–1602)
IgM (Immunoglobulin M), Srm: 76 mg/dL (ref 26–217)

## 2021-02-04 LAB — KAPPA/LAMBDA LIGHT CHAINS
Kappa free light chain: 15.3 mg/L (ref 3.3–19.4)
Kappa, lambda light chain ratio: 1.23 (ref 0.26–1.65)
Lambda free light chains: 12.4 mg/L (ref 5.7–26.3)

## 2021-02-05 ENCOUNTER — Ambulatory Visit: Payer: Medicare Other | Admitting: Psychiatry

## 2021-02-05 ENCOUNTER — Encounter: Payer: Self-pay | Admitting: Psychiatry

## 2021-02-05 ENCOUNTER — Other Ambulatory Visit: Payer: Self-pay

## 2021-02-05 DIAGNOSIS — F330 Major depressive disorder, recurrent, mild: Secondary | ICD-10-CM

## 2021-02-05 DIAGNOSIS — F5105 Insomnia due to other mental disorder: Secondary | ICD-10-CM | POA: Diagnosis not present

## 2021-02-05 DIAGNOSIS — F4001 Agoraphobia with panic disorder: Secondary | ICD-10-CM | POA: Diagnosis not present

## 2021-02-05 MED ORDER — CLONAZEPAM 0.5 MG PO TABS: 0.5000 mg | ORAL_TABLET | Freq: Two times a day (BID) | ORAL | 1 refills | Status: DC

## 2021-02-05 MED ORDER — DULOXETINE HCL 60 MG PO CPEP: 60.0000 mg | ORAL_CAPSULE | Freq: Every day | ORAL | 1 refills | Status: DC
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
## 2021-02-06 LAB — PROTEIN ELECTROPHORESIS, SERUM, WITH REFLEX
A/G Ratio: 1.4 (ref 0.7–1.7)
Albumin ELP: 3.3 g/dL (ref 2.9–4.4)
Alpha-1-Globulin: 0.2 g/dL (ref 0.0–0.4)
Alpha-2-Globulin: 1.1 g/dL — ABNORMAL HIGH (ref 0.4–1.0)
Beta Globulin: 0.8 g/dL (ref 0.7–1.3)
Gamma Globulin: 0.3 g/dL — ABNORMAL LOW (ref 0.4–1.8)
Globulin, Total: 2.4 g/dL (ref 2.2–3.9)
Total Protein ELP: 5.7 g/dL — ABNORMAL LOW (ref 6.0–8.5)

## 2021-02-23 NOTE — Progress Notes (Signed)
02/24/21  CC:  Chief Complaint  Patient presents with   Cysto     HPI: Tiffany Leon is a 70 y.o.female with personal history of urge incontinence, stress incontinence, and history of UTIs, who presents today for cystoscopy.    She has a past medical history of Waldenstrom macroglobulinemia, COPD, stage III chronic kidney disease, kidney stones, and congestive heart failure.   Urine frankly positive and she is symptomatic today. We did not proceed with cystoscopy.    PMH: Past Medical History:  Diagnosis Date   Acute kidney injury ([place])    a. 09/2018   Anxiety    Aortic atherosclerosis ([place])    a. 09/2018 noted on Chest CT.   Chronic combined systolic (congestive) and diastolic (congestive) heart failure ([place])    a. 09/2018 Echo: EF 45-50%, diff HK. Triv to small circumfirential pericardial eff.   Chronic DOE (dyspnea on exertion)    Claudication ([place])    a. Bilat hip claudication since ~ 2019.   Depression    Dyspnea    Emphysema lung ([place])    Exertional angina ([place])    a. Ex angina since ~ 2019.   GERD (gastroesophageal reflux disease)    Goals of care, counseling/discussion 11/03/2018   Hiatal hernia    History of bronchitis    History of kidney stones    Hypothyroidism    Pleural effusion    a. 09/2018 s/p thoracentesis-->937ml.   Pleural effusion 09/2018   Rash 10/2018   Rash began Saturday with unknown reason.  MD seen Monday 10/23/18   Resting tremor    Tobacco abuse    Waldenstrom's macroglobulinemia ([place]) 11/03/2018    Surgical History: Past Surgical History:  Procedure Laterality Date   BREAST LUMPECTOMY WITH RADIOACTIVE SEED LOCALIZATION Left 12/28/2017   Procedure: BREAST LUMPECTOMY WITH RADIOACTIVE SEED LOCALIZATION;  Surgeon: Jovita Kussmaul, MD;  Location: [facility];  Service: General;  Laterality: Left;   DG THUMB RIGHT HAND ([facility] HX)     IR IMAGING GUIDED PORT INSERTION  11/09/2018   IR THORACENTESIS ASP PLEURAL SPACE W/IMG GUIDE  09/20/2018   KIDNEY STONE  SURGERY      Home Medications:  Allergies as of 02/24/2021       Reactions   Codeine Nausea And Vomiting        Medication List        Accurate as of February 24, 2021 10:23 AM. If you have any questions, ask your nurse or doctor.          STOP taking these medications    levothyroxine 75 MCG tablet Commonly known as: SYNTHROID Stopped by: Hollice Espy, MD       TAKE these medications    Acetaminophen Extra Strength 500 MG capsule Generic drug: Acetaminophen 1 capsule as needed   aspirin 81 MG chewable tablet 1 tablet   atorvastatin 40 MG tablet Commonly known as: LIPITOR   carvedilol 12.5 MG tablet Commonly known as: COREG Take 1 tablet (12.5 mg total) by mouth 2 (two) times daily with a meal.   clonazePAM 0.5 MG tablet Commonly known as: KLONOPIN Take 1 tablet (0.5 mg total) by mouth 2 (two) times daily.   DULoxetine 60 MG capsule Commonly known as: CYMBALTA Take 1 capsule (60 mg total) by mouth daily.   Gemtesa 75 MG Tabs Generic drug: Vibegron Take 75 mg by mouth daily.   traZODone 50 MG tablet Commonly known as: DESYREL Take 1-2 tablets (50-100 mg total) by mouth at bedtime.  Allergies:  Allergies  Allergen Reactions   Codeine Nausea And Vomiting    Family History: Family History  Problem Relation Age of Onset   Stroke Mother        Mini-strokes. Died @ 85.   Dementia Mother    Lung cancer Father        Died in his 46's   Addison's disease Sister    Hypertension Brother    CAD Brother    Hypertension Brother     Social History:  reports that she has been smoking cigarettes. She has a 20.00 pack-year smoking history. She has never used smokeless tobacco. She reports that she does not currently use alcohol. She reports that she does not currently use drugs.   Physical Exam: BP (!) 145/76   Pulse 80   Ht 5\' 5"  (1.651 m)   Wt 131 lb (59.4 kg)   BMI 21.80 kg/m   Constitutional:  Alert and oriented, No acute  distress. HEENT: [place] AT, moist mucus membranes.  Trachea midline, no masses. Cardiovascular: No clubbing, cyanosis, or edema. Respiratory: Normal respiratory effort, no increased work of breathing. Skin: No rashes, bruises or suspicious lesions. Neurologic: Grossly intact, no focal deficits, moving all 4 extremities. Psychiatric: Normal mood and affect.  Laboratory Data:  Lab Results  Component Value Date   CREATININE 0.76 02/03/2021    Lab Results  Component Value Date   HGBA1C 4.6 (L) 09/28/2018    Urinalysis - >30 rbcs, 3-10 wbcs, man bacteria, and nitrite positive    Assessment & Plan:    Acute cystitis  - Urine today showed >30 rbcs, 3-10 wbcs, man bacteria, and nitrite positive  - Urine was sent  for culture today  -To ensure that we elect the correct antibiotic, we will hold off on treating this as her symptoms are fairly mild for the time being, she will let us know if her symptoms progress or she develops fevers chills or any other symptoms.   -We will call her later in the week to treat this infection reschedule cystoscopy for in about 2 weeks   [facility] [address] [place], [place] [address] [phone]  I have reviewed the above documentation for accuracy and completeness, and I agree with the above.   Hollice Espy, MD

## 2021-02-24 ENCOUNTER — Ambulatory Visit: Payer: Medicare Other | Admitting: Urology

## 2021-02-24 ENCOUNTER — Other Ambulatory Visit: Payer: Self-pay

## 2021-02-24 VITALS — BP 145/76 | HR 80 | Ht 65.0 in | Wt 131.0 lb

## 2021-02-24 DIAGNOSIS — N3281 Overactive bladder: Secondary | ICD-10-CM | POA: Diagnosis not present

## 2021-02-24 LAB — URINALYSIS, COMPLETE
Bilirubin, UA: NEGATIVE
Glucose, UA: NEGATIVE
Nitrite, UA: POSITIVE — AB
Specific Gravity, UA: 1.02 (ref 1.005–1.030)
Urobilinogen, Ur: 0.2 mg/dL (ref 0.2–1.0)
pH, UA: 5.5 (ref 5.0–7.5)

## 2021-02-24 LAB — MICROSCOPIC EXAMINATION: WBC, UA: >30 /hpf — AB (ref 0–5)

## 2021-02-27 ENCOUNTER — Telehealth: Payer: Self-pay

## 2021-02-27 LAB — CULTURE, URINE COMPREHENSIVE

## 2021-02-27 MED ORDER — SULFAMETHOXAZOLE-TRIMETHOPRIM 800-160 MG PO TABS: 1.0000 | ORAL_TABLET | Freq: Two times a day (BID) | ORAL | 0 refills | Status: AC

## 2021-02-27 NOTE — Telephone Encounter (Signed)
Left detailed message for pt. Sent medication to pharmacy.

## 2021-02-27 NOTE — Telephone Encounter (Signed)
-----   Message from Hollice Espy, MD sent at 02/27/2021  8:34 AM EST ----- Please prescribe this patient Bactrim DS twice daily x 7 days  Hollice Espy, MD

## 2021-03-10 NOTE — Progress Notes (Signed)
   03/11/21  CC:  Chief Complaint  Patient presents with   Cysto     HPI: Tiffany Leon is a 70 y.o.female with a personal history of urge incontinence, stress incontinence, and UTIs, who presents today for a cystoscopy.   She has a past medical history of Waldenstrom macroglobulinemia, COPD, stage III chronic kidney disease, kidney stones, and congestive heart failure.    She initially presented for further evaluation of OAB.   Previous cystoscopy on 02/24/2021 was deferred to today due to a frankly positive urinalysis accompanied by symptoms. She grew E.coli and was treated with Bactrim DS daily x 7 days.   She previously failed oxybutynin and prescribed mybetriq but was unable to obtain prescription due to the cost.  She reports that she checked a few weeks of the [place] but did not start taking it because she did not think it was effective.  She did see improvement in her urinary symptoms after taking the antibiotic.  She is no longer taking gemtesa and has about a week of the meds left.     Vitals:   03/11/21 0925  BP: 130/76  Pulse: 70   NED. A&Ox3.   No respiratory distress   Abd soft, NT, ND Normal external genitalia with patent urethral meatus  Cystoscopy Procedure Note  Patient identification was confirmed, informed consent was obtained, and patient was prepped using Betadine solution.  Lidocaine jelly was administered per urethral meatus.    Procedure: - Flexible cystoscope introduced, without any difficulty.   - Thorough search of the bladder revealed:    diffuse erythema likely due to acute cystitis     normal urethral meatus    no stones    no ulcers     no tumors    no urethral polyps    No trabeculation  - Ureteral orifices were normal in position and appearance.  Post-Procedure: - Patient tolerated the procedure well  Assessment/ Plan:  OAB  - Resume Gemtesa trial now that infection has cleared to help evaluate efficay; given a few more weeks of  stamples today.Marland Kitchen She will return to recheck symptoms  -Cystoscopy unremarkable other than some mild erythema, likely secondary to recent cystitis.  Recent cytology was a precaution. - Given single dose of Bactrim today   F/u 1 month reeval symptoms  I,Kailey Littlejohn,acting as a scribe for Hollice Espy, MD.,have documented all relevant documentation on the behalf of Hollice Espy, MD,as directed by  Hollice Espy, MD while in the presence of Hollice Espy, MD.  I have reviewed the above documentation for accuracy and completeness, and I agree with the above.   Hollice Espy, MD

## 2021-03-11 ENCOUNTER — Ambulatory Visit: Payer: Medicare Other | Admitting: Urology

## 2021-03-11 ENCOUNTER — Other Ambulatory Visit: Payer: Self-pay

## 2021-03-11 VITALS — BP 130/76 | HR 70 | Ht 65.0 in | Wt 131.0 lb

## 2021-03-11 DIAGNOSIS — N3281 Overactive bladder: Secondary | ICD-10-CM | POA: Diagnosis not present

## 2021-03-11 MED ORDER — SULFAMETHOXAZOLE-TRIMETHOPRIM 800-160 MG PO TABS
1.0000 | ORAL_TABLET | Freq: Once | ORAL | Status: AC
Start: 2021-03-11 — End: 2021-03-11
  Administered 2021-03-11: 1 via ORAL

## 2021-03-12 LAB — URINALYSIS, COMPLETE
Bilirubin, UA: NEGATIVE
Glucose, UA: NEGATIVE
Nitrite, UA: NEGATIVE
RBC, UA: NEGATIVE
Specific Gravity, UA: 1.025 (ref 1.005–1.030)
Urobilinogen, Ur: 0.2 mg/dL (ref 0.2–1.0)
pH, UA: 5.5 (ref 5.0–7.5)

## 2021-03-12 LAB — MICROSCOPIC EXAMINATION
Bacteria, UA: NONE SEEN
Epithelial Cells (non renal): >10 /hpf — AB (ref 0–10)
RBC, Urine: >30 /hpf — AB (ref 0–2)

## 2021-03-13 LAB — CYTOLOGY - NON PAP

## 2021-03-30 ENCOUNTER — Telehealth: Payer: Self-pay | Admitting: Psychiatry

## 2021-03-30 ENCOUNTER — Other Ambulatory Visit: Payer: Self-pay | Admitting: Psychiatry

## 2021-03-30 ENCOUNTER — Other Ambulatory Visit: Payer: Self-pay

## 2021-03-30 DIAGNOSIS — F5105 Insomnia due to other mental disorder: Secondary | ICD-10-CM

## 2021-03-30 MED ORDER — TRAZODONE HCL 100 MG PO TABS: 100.0000 mg | ORAL_TABLET | Freq: Every evening | ORAL | 1 refills | Status: DC | PRN

## 2021-03-30 NOTE — Telephone Encounter (Signed)
Sent!

## 2021-03-30 NOTE — Telephone Encounter (Signed)
Pt LM on VM stating since increase to 3 @ night of the Trazodone she will be out sooner. Need Rx to [facility]. Apt 4/20

## 2021-03-30 NOTE — Telephone Encounter (Signed)
Do you want her to take 3 tabs?or update rx to 150 mg tab HS

## 2021-03-31 ENCOUNTER — Encounter: Payer: Self-pay | Admitting: Urology

## 2021-03-31 NOTE — Telephone Encounter (Signed)
Made an appointment for evaluation

## 2021-04-01 ENCOUNTER — Ambulatory Visit: Payer: Medicare Other | Admitting: Physician Assistant

## 2021-04-03 ENCOUNTER — Other Ambulatory Visit: Payer: Self-pay

## 2021-04-03 ENCOUNTER — Ambulatory Visit: Payer: Medicare Other | Admitting: Physician Assistant

## 2021-04-03 ENCOUNTER — Encounter: Payer: Self-pay | Admitting: Physician Assistant

## 2021-04-03 VITALS — BP 126/84 | HR 76 | Ht 65.0 in | Wt 135.0 lb

## 2021-04-03 DIAGNOSIS — R300 Dysuria: Secondary | ICD-10-CM

## 2021-04-03 DIAGNOSIS — N3281 Overactive bladder: Secondary | ICD-10-CM

## 2021-04-03 LAB — URINALYSIS, COMPLETE
Bilirubin, UA: NEGATIVE
Glucose, UA: NEGATIVE
Ketones, UA: NEGATIVE
Nitrite, UA: POSITIVE — AB
Specific Gravity, UA: 1.02 (ref 1.005–1.030)
Urobilinogen, Ur: 0.2 mg/dL (ref 0.2–1.0)
pH, UA: 6 (ref 5.0–7.5)

## 2021-04-03 LAB — MICROSCOPIC EXAMINATION: WBC, UA: >30 /hpf — ABNORMAL HIGH (ref 0–5)

## 2021-04-03 LAB — BLADDER SCAN AMB NON-IMAGING

## 2021-04-03 MED ORDER — AMOXICILLIN-POT CLAVULANATE 875-125 MG PO TABS: 1.0000 | ORAL_TABLET | Freq: Two times a day (BID) | ORAL | 0 refills | Status: AC

## 2021-04-03 NOTE — Progress Notes (Signed)
04/03/2021 4:33 PM   Tiffany Leon 30-Dec-1950 [id number]  CC: Chief Complaint  Patient presents with   Urinary Frequency    HPI: Tiffany Leon is a 70 y.o. female with PMH OAB with mixed incontinence who presents today for evaluation of possible UTI.  She underwent cystoscopy with Dr. Erlene Quan on 03/11/2021 with findings of diffuse erythema likely due to acute cystitis.  She received a single dose of Bactrim at that time for UTI prevention.  Today she reports an approximate 10-day history of cloudy urine with later onset of dysuria and nausea.  She took Azo most recently 3 days ago.  She denies fever, chills, and vomiting.  This would represent her second UTI treated in 2 months.  She states this is not typical for her at baseline.  She remains on a trial of Gemtesa and has 2 weeks of samples left but is not sure if it is helping her.  In-office UA today positive for 1+ blood, 1+ protein, nitrites, and 3+ leukocyte esterase; urine microscopy with >30 WBCs/HPF and many bacteria.  PVR 6 mL.  PMH: Past Medical History:  Diagnosis Date   Acute kidney injury ([place])    a. 09/2018   Anxiety    Aortic atherosclerosis ([place])    a. 09/2018 noted on Chest CT.   Chronic combined systolic (congestive) and diastolic (congestive) heart failure ([place])    a. 09/2018 Echo: EF 45-50%, diff HK. Triv to small circumfirential pericardial eff.   Chronic DOE (dyspnea on exertion)    Claudication ([place])    a. Bilat hip claudication since ~ 2019.   Depression    Dyspnea    Emphysema lung ([place])    Exertional angina ([place])    a. Ex angina since ~ 2019.   GERD (gastroesophageal reflux disease)    Goals of care, counseling/discussion 11/03/2018   Hiatal hernia    History of bronchitis    History of kidney stones    Hypothyroidism    Pleural effusion    a. 09/2018 s/p thoracentesis-->971ml.   Pleural effusion 09/2018   Rash 10/2018   Rash began Saturday with unknown reason.  MD seen Monday 10/23/18   Resting  tremor    Tobacco abuse    Waldenstrom's macroglobulinemia ([place]) 11/03/2018    Surgical History: Past Surgical History:  Procedure Laterality Date   BREAST LUMPECTOMY WITH RADIOACTIVE SEED LOCALIZATION Left 12/28/2017   Procedure: BREAST LUMPECTOMY WITH RADIOACTIVE SEED LOCALIZATION;  Surgeon: Jovita Kussmaul, MD;  Location: [facility];  Service: General;  Laterality: Left;   DG THUMB RIGHT HAND ([facility] HX)     IR IMAGING GUIDED PORT INSERTION  11/09/2018   IR THORACENTESIS ASP PLEURAL SPACE W/IMG GUIDE  09/20/2018   KIDNEY STONE SURGERY      Home Medications:  Allergies as of 04/03/2021       Reactions   Codeine Nausea And Vomiting        Medication List        Accurate as of April 03, 2021  4:33 PM. If you have any questions, ask your nurse or doctor.          Acetaminophen Extra Strength 500 MG capsule Generic drug: Acetaminophen 1 capsule as needed   amoxicillin-clavulanate 875-125 MG tablet Commonly known as: AUGMENTIN Take 1 tablet by mouth 2 (two) times daily for 7 days. Started by: Debroah Loop, PA-C   aspirin 81 MG chewable tablet 1 tablet   carvedilol 12.5 MG tablet Commonly known as: COREG Take 1  tablet (12.5 mg total) by mouth 2 (two) times daily with a meal.   clonazePAM 0.5 MG tablet Commonly known as: KLONOPIN Take 1 tablet (0.5 mg total) by mouth 2 (two) times daily.   DULoxetine 60 MG capsule Commonly known as: CYMBALTA Take 1 capsule (60 mg total) by mouth daily.   traZODone 100 MG tablet Commonly known as: DESYREL Take 1-1.5 tablets (100-150 mg total) by mouth at bedtime as needed for sleep.        Allergies:  Allergies  Allergen Reactions   Codeine Nausea And Vomiting    Family History: Family History  Problem Relation Age of Onset   Stroke Mother        Mini-strokes. Died @ 80.   Dementia Mother    Lung cancer Father        Died in his 47's   Addison's disease Sister    Hypertension Brother    CAD Brother     Hypertension Brother     Social History:   reports that she has been smoking cigarettes. She has a 20.00 pack-year smoking history. She has never used smokeless tobacco. She reports that she does not currently use alcohol. She reports that she does not currently use drugs.  Physical Exam: BP 126/84    Pulse 76    Ht 5\' 5"  (1.651 m)    Wt 135 lb (61.2 kg)    BMI 22.47 kg/m   Constitutional:  Alert and oriented, no acute distress, nontoxic appearing HEENT: [place], AT Cardiovascular: No clubbing, cyanosis, or edema Respiratory: Normal respiratory effort, no increased work of breathing Skin: No rashes, bruises or suspicious lesions Neurologic: Grossly intact, no focal deficits, moving all 4 extremities Psychiatric: Normal mood and affect  Laboratory Data: Results for orders placed or performed in visit on 04/03/21  Microscopic Examination   Urine  Result Value Ref Range   WBC, UA >30 (H) 0 - 5 /hpf   RBC 0-2 0 - 2 /hpf   Epithelial Cells (non renal) 0-10 0 - 10 /hpf   Bacteria, UA Many (A) None seen/Few  Urinalysis, Complete  Result Value Ref Range   Specific Gravity, UA 1.020 1.005 - 1.030   pH, UA 6.0 5.0 - 7.5   Color, UA Yellow Yellow   Appearance Ur Cloudy (A) Clear   Leukocytes,UA 3+ (A) Negative   Protein,UA 1+ (A) Negative/Trace   Glucose, UA Negative Negative   Ketones, UA Negative Negative   RBC, UA 1+ (A) Negative   Bilirubin, UA Negative Negative   Urobilinogen, Ur 0.2 0.2 - 1.0 mg/dL   Nitrite, UA Positive (A) Negative   Microscopic Examination See below:   Bladder Scan (Post Void Residual) in office  Result Value Ref Range   Scan Result 75mL    Assessment & Plan:   1. Dysuria UA grossly infected today.  Will start empiric Augmentin and send for culture for further evaluation.  If she continues to have recurrent UTI, may consider renal ultrasound or topical vaginal estrogen cream in the future. - Urinalysis, Complete - CULTURE, URINE COMPREHENSIVE -  amoxicillin-clavulanate (AUGMENTIN) 875-125 MG tablet; Take 1 tablet by mouth 2 (two) times daily for 7 days.  Dispense: 14 tablet; Refill: 0  2. OAB (overactive bladder) Counseled her to continue Gemtesa and provided an additional 2 weeks of samples today.  I am pushing back her scheduled follow-up with Dr. Erlene Quan by an additional 2 weeks to allow for clearance of her current symptoms and to better assess her  symptoms in the absence of infection.  She is emptying appropriately today. - Bladder Scan (Post Void Residual) in office  Return in about 4 weeks (around 05/01/2021) for Sx recheck with Dr. Erlene Quan.  Debroah Loop, PA-C  [facility] [address] [place], [place] [address] [phone]

## 2021-04-09 LAB — CULTURE, URINE COMPREHENSIVE

## 2021-04-13 ENCOUNTER — Other Ambulatory Visit: Payer: Self-pay | Admitting: Internal Medicine

## 2021-04-22 ENCOUNTER — Ambulatory Visit: Payer: Medicare Other | Admitting: Urology

## 2021-05-05 NOTE — Progress Notes (Signed)
05/06/21 10:01 AM   Tiffany Leon 01-23-1951 [id number]  Referring provider:  Pixie Casino, MD [address] [place],  [place] [address] Chief Complaint  Patient presents with   Over Active Bladder     HPI: Tiffany Leon is a 71 y.o.female with a personal history of OAB and UTIs, who presents today for 1 month symptoms recheck with UA.   She has a personal history of stones . CT scan in 2020 visualized at 6 mm stone in the right renal pelvis without hydronephrosis.  She does note that she has had to have procedures for this in the past.  He has not had any recent imaging after this study.  She denies any flank pain.  She has a past medical history of Waldenstrom macroglobulinemia, COPD, stage III chronic kidney disease, kidney stones, and congestive heart failure  She previously failed oxybutynin and prescribed mybetriq but was unable to obtain prescription due to the cost.   Cystoscopy on 03/11/2021 was unremarkable other than some mild erythema, likely secondary to cystitis.   She was seen by Debroah Loop on 04/03/2021 for UTI. Her urinalysis showed 3+ leukocytes, 1+ RBCs,and nitrite positive. Her culture grew E.Coli that was resistant to ampicillin, cefazolin, ceftriaxone, cefuroxime, ciprofloxacin, levofloxacin, and tetracycline. She was treated with Augmentin.   Since last visit, she took [place] for about a month.  She saw her dermatologist last week and has severe eczema.  He felt that Gemtesa possibly may be contributing to her rash and that she stopped this medication.  She reports that she had some mild improvement with this, less "gushing" episodes but continues to have severe symptoms including urgency, frequency, and urge incontinence.  She reports that she is still experiencing gushing. She denies any UTI symptoms today.    PMH: Past Medical History:  Diagnosis Date   Acute kidney injury ([place])    a. 09/2018   Anxiety    Aortic atherosclerosis  ([place])    a. 09/2018 noted on Chest CT.   Chronic combined systolic (congestive) and diastolic (congestive) heart failure ([place])    a. 09/2018 Echo: EF 45-50%, diff HK. Triv to small circumfirential pericardial eff.   Chronic DOE (dyspnea on exertion)    Claudication ([place])    a. Bilat hip claudication since ~ 2019.   Depression    Dyspnea    Emphysema lung ([place])    Exertional angina ([place])    a. Ex angina since ~ 2019.   GERD (gastroesophageal reflux disease)    Goals of care, counseling/discussion 11/03/2018   Hiatal hernia    History of bronchitis    History of kidney stones    Hypothyroidism    Pleural effusion    a. 09/2018 s/p thoracentesis-->912ml.   Pleural effusion 09/2018   Rash 10/2018   Rash began Saturday with unknown reason.  MD seen Monday 10/23/18   Resting tremor    Tobacco abuse    Waldenstrom's macroglobulinemia ([place]) 11/03/2018    Surgical History: Past Surgical History:  Procedure Laterality Date   BREAST LUMPECTOMY WITH RADIOACTIVE SEED LOCALIZATION Left 12/28/2017   Procedure: BREAST LUMPECTOMY WITH RADIOACTIVE SEED LOCALIZATION;  Surgeon: Jovita Kussmaul, MD;  Location: [facility];  Service: General;  Laterality: Left;   DG THUMB RIGHT HAND ([facility] HX)     IR IMAGING GUIDED PORT INSERTION  11/09/2018   IR THORACENTESIS ASP PLEURAL SPACE W/IMG GUIDE  09/20/2018   KIDNEY STONE SURGERY      Home Medications:  Allergies  as of 05/06/2021       Reactions   Codeine Nausea And Vomiting        Medication List        Accurate as of May 06, 2021 10:01 AM. If you have any questions, ask your nurse or doctor.          Acetaminophen Extra Strength 500 MG capsule Generic drug: Acetaminophen 1 capsule as needed   aspirin 81 MG chewable tablet 1 tablet   carvedilol 12.5 MG tablet Commonly known as: COREG TAKE 1 TABLET (12.5 MG TOTAL) BY MOUTH 2 (TWO) TIMES DAILY WITH A MEAL.   clonazePAM 0.5 MG tablet Commonly known as: KLONOPIN Take 1 tablet (0.5 mg total)  by mouth 2 (two) times daily.   DULoxetine 60 MG capsule Commonly known as: CYMBALTA Take 1 capsule (60 mg total) by mouth daily.   Gemtesa 75 MG Tabs Generic drug: Vibegron 1 tablet   traZODone 100 MG tablet Commonly known as: DESYREL Take 1-1.5 tablets (100-150 mg total) by mouth at bedtime as needed for sleep.        Allergies:  Allergies  Allergen Reactions   Codeine Nausea And Vomiting    Family History: Family History  Problem Relation Age of Onset   Stroke Mother        Mini-strokes. Died @ 62.   Dementia Mother    Lung cancer Father        Died in his 60's   Addison's disease Sister    Hypertension Brother    CAD Brother    Hypertension Brother     Social History:  reports that she has been smoking cigarettes. She has a 20.00 pack-year smoking history. She has never used smokeless tobacco. She reports that she does not currently use alcohol. She reports that she does not currently use drugs.   Physical Exam: BP 127/76    Pulse 80    Ht 5\' 5"  (1.651 m)    Wt 135 lb (61.2 kg)    BMI 22.47 kg/m   Constitutional:  Alert and oriented, No acute distress. HEENT: [place] AT, moist mucus membranes.  Trachea midline, no masses. Cardiovascular: No clubbing, cyanosis, or edema. Respiratory: Normal respiratory effort, no increased work of breathing. Skin: No rashes, bruises or suspicious lesions. Neurologic: Grossly intact, no focal deficits, moving all 4 extremities. Psychiatric: Normal mood and affect.  Laboratory Data:  Lab Results  Component Value Date   CREATININE 0.76 02/03/2021   Lab Results  Component Value Date   HGBA1C 4.6 (L) 09/28/2018    Urinalysis Frankly positive many bacteria many leukocytes, trace blood  Pertinent Imaging: Results for orders placed or performed in visit on 05/06/21  Bladder Scan (Post Void Residual) in office  Result Value Ref Range   Scan Result 22     Assessment & Plan:   OAB  - She has not had improvement on  medication  - We discussed Botox versus implanted device versus PTNS vs insterstim given her failure to respond to multiple oral agents.  She is most interested in Botox.  - We discussed Botox injection in detail today. We discussed the risk and benefits. Risks include urinary retention. She is agreeable with this plan.  Her preference is to have this done in the operating room under sedation at least for the first procedure. - Urine sent for pre-operative culture   2. Recurrent UTI  - Urine frankly positive today  - Asymptomatic  - Will hold off on treatment  -  Will send for pre-op culture.   3. History of kidney stones  - She has not had any recent imagine. Recommend KUB to visualize stone seen in 2020.  - KUB; scheduled. Will call her with results  -Ultimately benefit from addition of topical estrogen cream for UTI prevention  Follow-up in OR for Botox injection  I,Kailey Littlejohn,acting as a scribe for Hollice Espy, MD.,have documented all relevant documentation on the behalf of Hollice Espy, MD,as directed by  Hollice Espy, MD while in the presence of Hollice Espy, MD.  I have reviewed the above documentation for accuracy and completeness, and I agree with the above.   Hollice Espy, MD   [facility] [address], [place], [place] [address] [phone]

## 2021-05-06 ENCOUNTER — Ambulatory Visit
Admission: RE | Admit: 2021-05-06 | Discharge: 2021-05-06 | Disposition: A | Discharge status: Home / Self Care | Payer: Medicare Other | Attending: Urology | Admitting: Urology

## 2021-05-06 ENCOUNTER — Ambulatory Visit: Payer: Medicare Other | Admitting: Urology

## 2021-05-06 ENCOUNTER — Other Ambulatory Visit: Payer: Self-pay

## 2021-05-06 ENCOUNTER — Encounter: Payer: Self-pay | Admitting: Urology

## 2021-05-06 ENCOUNTER — Ambulatory Visit
Admission: RE | Admit: 2021-05-06 | Discharge: 2021-05-06 | Disposition: A | Discharge status: Home / Self Care | Payer: Medicare Other | Source: Ambulatory Visit | Attending: Urology | Admitting: Urology

## 2021-05-06 ENCOUNTER — Telehealth: Payer: Self-pay | Admitting: Urology

## 2021-05-06 VITALS — BP 127/76 | HR 80 | Ht 65.0 in | Wt 135.0 lb

## 2021-05-06 DIAGNOSIS — N3281 Overactive bladder: Secondary | ICD-10-CM | POA: Diagnosis not present

## 2021-05-06 DIAGNOSIS — R300 Dysuria: Secondary | ICD-10-CM | POA: Diagnosis not present

## 2021-05-06 DIAGNOSIS — Z87442 Personal history of urinary calculi: Secondary | ICD-10-CM

## 2021-05-06 LAB — BLADDER SCAN AMB NON-IMAGING: Scan Result: 22

## 2021-05-06 NOTE — Telephone Encounter (Signed)
Surgical Physician Order Form [facility]  * Scheduling expectation : Next Available  *Length of Case:   *Clearance needed: no  *Anticoagulation Instructions: N/A  *Aspirin Instructions: Ok to continue all  *Post-op visit Date/Instructions:  4-6 week w/PVR  *Diagnosis:  OAB  *Procedure:  Intravesical Botox injection   Additional orders: Botox Injection 100 units  -Admit type: OUTpatient  -Anesthesia: MAC  -VTE Prophylaxis Standing Order SCDs       Other:   -Standing Lab Orders Per Anesthesia    Lab other: None  -Standing Test orders EKG/Chest x-ray per Anesthesia       Test other:   - Medications:  Ancef 2gm IV  -Other orders:  N/A

## 2021-05-07 ENCOUNTER — Telehealth: Payer: Self-pay | Admitting: *Deleted

## 2021-05-07 ENCOUNTER — Other Ambulatory Visit: Payer: Self-pay

## 2021-05-07 DIAGNOSIS — N3281 Overactive bladder: Secondary | ICD-10-CM

## 2021-05-07 LAB — URINALYSIS, COMPLETE
Bilirubin, UA: NEGATIVE
Glucose, UA: NEGATIVE
Ketones, UA: NEGATIVE
Nitrite, UA: POSITIVE — AB
Specific Gravity, UA: 1.015 (ref 1.005–1.030)
Urobilinogen, Ur: 0.2 mg/dL (ref 0.2–1.0)
pH, UA: 6 (ref 5.0–7.5)

## 2021-05-07 LAB — MICROSCOPIC EXAMINATION: WBC, UA: >30 /hpf — AB (ref 0–5)

## 2021-05-07 NOTE — Progress Notes (Signed)
[facility] Posting Form   Surgery Date/Time: Date: 05/25/2021  Surgeon: Dr. Hollice Espy, MD  Surgery Location: Day Surgery  Inpt ( No  )   Outpt (Yes)   Obs ( No  )    Diagnosis: OverActive Bladder N32.81  -CPT: 62831  Surgery: Cystoscopy with Botox injection  Stop Anticoagulations: No  Cardiac/Medical/Pulmonary Clearance needed: No  *Orders entered into EPIC  Date: 05/07/21   *Case booked in EPIC  Date: 05/07/21  *Notified pt of Surgery: Date: 05/07/21  *Placed into Prior Authorization Work Fabio Bering Date: 05/07/21   Assistant/laser/rep:No

## 2021-05-07 NOTE — Telephone Encounter (Addendum)
Patient advised, voiced understanding. Scheduled follow up for office visit to discuss further.   ----- Message from Hollice Espy, MD sent at 05/07/2021 10:07 AM EST ----- I am glad you got this x-ray.  It looks like you do have a pretty significant increase in your overall stone burden on your right side from 2 years ago.  We will plan to talk about this at your follow-up.  Hollice Espy, MD

## 2021-05-07 NOTE — Telephone Encounter (Signed)
I spoke with Mrs. Tiffany Leon. We have discussed possible surgery dates and Monday February 6th, 2023 was agreed upon by all parties. Patient given information about surgery date, what to expect pre-operatively and post operatively.   We discussed that a Pre-Admission Testing office will be calling to set up the pre-op visit that will take place prior to surgery, and that these appointments are typically done over the phone with a Pre-Admissions RN.   Informed patient that our office will communicate any additional care to be provided after surgery. Patients questions or concerns were discussed during our call. Advised to call our office should there be any additional information, questions or concerns that arise. Patient verbalized understanding.

## 2021-05-09 LAB — CULTURE, URINE COMPREHENSIVE

## 2021-05-11 ENCOUNTER — Telehealth: Payer: Self-pay | Admitting: *Deleted

## 2021-05-11 ENCOUNTER — Encounter: Payer: Self-pay | Admitting: Urology

## 2021-05-11 MED ORDER — SULFAMETHOXAZOLE-TRIMETHOPRIM 800-160 MG PO TABS: 1.0000 | ORAL_TABLET | Freq: Two times a day (BID) | ORAL | 0 refills | Status: DC

## 2021-05-11 NOTE — Telephone Encounter (Addendum)
Patient informed, voiced understanding. Sent in RX to [facility] as requested.   ----- Message from Hollice Espy, MD sent at 05/11/2021  9:35 AM EST ----- Asymptomatic bacteriuria but she is scheduled for Botox on the first. Treated with Bactrim DS twice daily for 5 days preoperatively.  Hollice Espy, MD

## 2021-05-15 ENCOUNTER — Telehealth: Payer: Self-pay | Admitting: *Deleted

## 2021-05-15 ENCOUNTER — Other Ambulatory Visit: Payer: Self-pay

## 2021-05-15 ENCOUNTER — Encounter
Admission: RE | Admit: 2021-05-15 | Discharge: 2021-05-15 | Disposition: A | Discharge status: Home / Self Care | Payer: Medicare Other | Source: Ambulatory Visit | Attending: Urology | Admitting: Urology

## 2021-05-15 VITALS — BP 139/79 | HR 84 | Temp 97.5°F | Resp 22 | Ht 65.5 in

## 2021-05-15 DIAGNOSIS — I160 Hypertensive urgency: Secondary | ICD-10-CM | POA: Insufficient documentation

## 2021-05-15 DIAGNOSIS — N1831 Chronic kidney disease, stage 3a: Secondary | ICD-10-CM | POA: Insufficient documentation

## 2021-05-15 DIAGNOSIS — Z01818 Encounter for other preprocedural examination: Secondary | ICD-10-CM | POA: Insufficient documentation

## 2021-05-15 DIAGNOSIS — D631 Anemia in chronic kidney disease: Secondary | ICD-10-CM | POA: Diagnosis not present

## 2021-05-15 DIAGNOSIS — I129 Hypertensive chronic kidney disease with stage 1 through stage 4 chronic kidney disease, or unspecified chronic kidney disease: Secondary | ICD-10-CM | POA: Insufficient documentation

## 2021-05-15 LAB — BASIC METABOLIC PANEL
Anion gap: 7 (ref 5–15)
BUN: 16 mg/dL (ref 8–23)
CO2: 25 mmol/L (ref 22–32)
Calcium: 9.2 mg/dL (ref 8.9–10.3)
Chloride: 105 mmol/L (ref 98–111)
Creatinine, Ser: 0.9 mg/dL (ref 0.44–1.00)
GFR, Estimated: >60 mL/min (ref >60)
Glucose, Bld: 86 mg/dL (ref 70–99)
Potassium: 3.8 mmol/L (ref 3.5–5.1)
Sodium: 137 mmol/L (ref 135–145)

## 2021-05-15 LAB — CBC
HCT: 41.4 % (ref 36.0–46.0)
Hemoglobin: 13.5 g/dL (ref 12.0–15.0)
MCH: 31.4 pg (ref 26.0–34.0)
MCHC: 32.6 g/dL (ref 30.0–36.0)
MCV: 96.3 fL (ref 80.0–100.0)
Platelets: 255 10*3/uL (ref 150–400)
RBC: 4.3 MIL/uL (ref 3.87–5.11)
RDW: 12.7 % (ref 11.5–15.5)
WBC: 3.9 10*3/uL — ABNORMAL LOW (ref 4.0–10.5)
nRBC: 0 % (ref 0.0–0.2)

## 2021-05-15 NOTE — Telephone Encounter (Signed)
Left voice mail to call back 

## 2021-05-15 NOTE — Telephone Encounter (Signed)
Request for pre-operative cardiac clearance Received: Today Karen Kitchens, NP  P Cv Div Preop Callback Request for pre-operative cardiac clearance:     1. What type of surgery is being performed?  CYSTOSCOPY; BOTOX INJECTION   2. When is this surgery scheduled?  05/25/2021     3. Type of clearance being requested (medical, pharmacy, both).  MEDICAL     4. Are there any medications that need to be held prior to surgery?  NONE   5. Practice name and name of physician performing surgery?  Performing surgeon: Dr. Hollice Espy, MD  Requesting clearance: Honor Loh, FNP-C       6. Anesthesia type (none, local, MAC, general)?  MAC   7. What is the office phone and fax number?    Phone: [phone]  Fax: [phone]   ATTENTION: Unable to create telephone message as per your standard workflow. Directed by [facility] providers to send requests for cardiac clearance to this pool for appropriate distribution to provider covering pre-operative clearances.   Honor Loh, MSN, APRN, FNP-C, CEN  [facility]  Peri-operative Services Nurse Practitioner  Phone: [phone]  05/15/21 11:24 AM

## 2021-05-15 NOTE — Patient Instructions (Signed)
Your procedure is scheduled on: Monday May 25, 2021. Report to Day Surgery inside [facility] 2nd floor, stop by admissions desk first before getting on elevator. To find out your arrival time please call [phone] between 1PM - 3PM on Friday May 22, 2021.  Remember: Instructions that are not followed completely may result in serious medical risk,  up to and including death, or upon the discretion of your surgeon and anesthesiologist your  surgery may need to be rescheduled.     _X__ 1. Do not eat food or drink fluids after midnight the night before your procedure.                 No chewing gum or hard candies.   __X__2.  On the morning of surgery brush your teeth with toothpaste and water, you                may rinse your mouth with mouthwash if you wish.  Do not swallow any toothpaste or mouthwash.     _X__ 3.  No Alcohol for 24 hours before or after surgery.   _X__ 4.  Do Not Smoke or use e-cigarettes For 24 Hours Prior to Your Surgery.                 Do not use any chewable tobacco products for at least 6 hours prior to                 Surgery.  _X__  5.  Do not use any recreational drugs (marijuana, cocaine, heroin, ecstasy, MDMA or other)                For at least one week prior to your surgery.  Combination of these drugs with anesthesia                May have life threatening results.  ____  6.  Bring all medications with you on the day of surgery if instructed.   __X__  7.  Notify your doctor if there is any change in your medical condition      (cold, fever, infections).     Do not wear jewelry, make-up, hairpins, clips or nail polish. Do not wear lotions, powders, or perfumes. You may wear deodorant. Do not shave 48 hours prior to surgery. Men may shave face and neck. Do not bring valuables to the hospital.    [facility] is not responsible for any belongings or valuables.  Contacts, dentures or bridgework may not be worn into  surgery. Leave your suitcase in the car. After surgery it may be brought to your room. For patients admitted to the hospital, discharge time is determined by your treatment team.   Patients discharged the day of surgery will not be allowed to drive home.   Make arrangements for someone to be with you for the first 24 hours of your Same Day Discharge.   __X__ Take these medicines the morning of surgery with A SIP OF WATER:    1. carvedilol (COREG) 12.5 MG  2. clonazePAM (KLONOPIN) 0.5 MG  3. DULoxetine (CYMBALTA) 60 MG   4.  5.  6.  ____ Fleet Enema (as directed)   ____ Use CHG Soap (or wipes) as directed  ____ Use Benzoyl Peroxide Gel as instructed  ____ Use inhalers on the day of surgery  ____ Stop metformin 2 days prior to surgery    ____ Take 1/2 of usual insulin dose the night before surgery.  No insulin the morning          of surgery.   ____ Call your PCP, cardiologist, or Pulmonologist if taking Coumadin/Plavix/aspirin and ask when to stop before your surgery.   __X__ One Week prior to surgery- Stop Anti-inflammatories such as Ibuprofen, Aleve, Advil, Motrin, meloxicam (MOBIC), diclofenac, etodolac, ketorolac, Toradol, Daypro, piroxicam, Goody's or BC powders. OK TO USE TYLENOL IF NEEDED   ___X_ Stop supplements until after surgery.    ____ Bring C-Pap to the hospital.    If you have any questions regarding your pre-procedure instructions,  Please call Pre-admit Testing at [phone]

## 2021-05-19 ENCOUNTER — Encounter: Payer: Self-pay | Admitting: Urology

## 2021-05-19 NOTE — Telephone Encounter (Signed)
Pt has been scheduled to see Doreene Adas, NP, 05/20/21 @ 8:50. Will route back to the requesting surgeon's office to make them aware.

## 2021-05-19 NOTE — H&P (View-Only) (Signed)
° °05/20/21 °11:31 AM  ° °Tiffany Leon °07/17/1950 °[id number] ° °Referring provider:  °Hilty, Kenneth C, MD °[address] °[address] °[place],  [place] [address] °Chief Complaint  °Patient presents with  ° Nephrolithiasis  ° ° ° °HPI: °Tiffany Leon is a 70 y.o.female with a personal history of OAB and UTIs, who presents today to discuss stone treatment.  ° °She has a past medical history of Waldenstrom macroglobulinemia, COPD, stage III chronic kidney disease, kidney stones, and congestive heart failure. °  °CT scan in 2020 visualized at 6 mm stone in the right renal pelvis without hydronephrosis.  She noted that she has had to have procedures for this in the past.  ° °KUB on 05/06/2021 revealed right nephrolithiasis measuring up to approximately 1.3 cm. °  °She reports that she has had increased back pain.  ° °She is scheduled Monday in the OR for botox under MAC for OAB. ° °PMH: °Past Medical History:  °Diagnosis Date  ° Acute kidney injury (HCC)   ° a. 09/2018  ° Anxiety   ° Aortic atherosclerosis (HCC)   ° a. 09/2018 noted on Chest CT.  ° Chronic combined systolic (congestive) and diastolic (congestive) heart failure (HCC)   ° a.) 09/2018 Echo: EF 45-50%, diff HK. Triv to small circumfirential pericardial eff. b.) TTE 02/14/2020: EF 60-65%; no RWMAs; G1DD; GLS -23.3%  ° Chronic DOE (dyspnea on exertion)   ° Claudication (HCC)   ° a. Bilat hip claudication since ~ 2019.  ° COPD (chronic obstructive pulmonary disease) (HCC)   ° Coronary artery calcification seen on CT scan 09/20/2018  ° Depression   ° Dyspnea   ° Emphysema lung (HCC)   ° Exertional angina (HCC)   ° a. Ex angina since ~ 2019.  ° GERD (gastroesophageal reflux disease)   ° Goals of care, counseling/discussion 11/03/2018  ° Hiatal hernia   ° History of 2019 novel coronavirus disease (COVID-19) 01/16/2021  ° History of bronchitis   ° History of kidney stones   ° Hypothyroidism   ° Pleural effusion   ° a. 09/2018 s/p thoracentesis-->900ml.  ° Resting tremor   °  Tobacco abuse   ° Waldenstrom's macroglobulinemia (HCC) 11/03/2018  ° ° °Surgical History: °Past Surgical History:  °Procedure Laterality Date  ° BREAST LUMPECTOMY WITH RADIOACTIVE SEED LOCALIZATION Left 12/28/2017  ° Procedure: BREAST LUMPECTOMY WITH RADIOACTIVE SEED LOCALIZATION;  Surgeon: Toth, Paul III, MD;  Location: [facility];  Service: General;  Laterality: Left;  ° BREAST SURGERY    ° DG THUMB RIGHT HAND ([facility] HX)    ° IR IMAGING GUIDED PORT INSERTION  11/09/2018  ° IR THORACENTESIS ASP PLEURAL SPACE W/IMG GUIDE  09/20/2018  ° KIDNEY STONE SURGERY    ° ° °Home Medications:  °Allergies as of 05/20/2021   ° °   Reactions  ° Codeine Nausea And Vomiting  ° °  ° °  °Medication List  °  ° °  ° Accurate as of May 20, 2021 11:31 AM. If you have any questions, ask your nurse or doctor.  °  °  ° °  ° °STOP taking these medications   ° °sulfamethoxazole-trimethoprim 800-160 MG tablet °Commonly known as: BACTRIM DS °Stopped by: Petina Muraski, MD °  ° °  ° °TAKE these medications   ° °Acetaminophen Extra Strength 500 MG capsule °Generic drug: Acetaminophen °Take 1,000 mg by mouth every 6 (six) hours as needed for pain. °  °carvedilol 12.5 MG tablet °Commonly known as: COREG °TAKE 1 TABLET (  12.5 MG TOTAL) BY MOUTH 2 (TWO) TIMES DAILY WITH A MEAL. °What changed: when to take this °  °clonazePAM 0.5 MG tablet °Commonly known as: KLONOPIN °Take 1 tablet (0.5 mg total) by mouth 2 (two) times daily. °  °DULoxetine 60 MG capsule °Commonly known as: CYMBALTA °Take 1 capsule (60 mg total) by mouth daily. °  °traZODone 100 MG tablet °Commonly known as: DESYREL °Take 1-1.5 tablets (100-150 mg total) by mouth at bedtime as needed for sleep. °What changed:  °how much to take °when to take this °  °triamcinolone ointment 0.1 % °Commonly known as: KENALOG °Apply 1 application topically daily as needed (eczema). °  ° °  ° ° °Allergies:  °Allergies  °Allergen Reactions  ° Codeine Nausea And Vomiting  ° ° °Family History: °Family History   °Problem Relation Age of Onset  ° Stroke Mother   °     Mini-strokes. Died @ 62.  ° Dementia Mother   ° Lung cancer Father   °     Died in his 70's  ° Addison's disease Sister   ° Hypertension Brother   ° CAD Brother   ° Hypertension Brother   ° ° °Social History:  reports that she has been smoking cigarettes. She has a 20.00 pack-year smoking history. She has never used smokeless tobacco. She reports that she does not currently use alcohol. She reports that she does not currently use drugs. ° ° °Physical Exam: °BP 117/80    Pulse 76    Ht 5' 5.5" (1.664 m)    Wt 132 lb (59.9 kg)    BMI 21.63 kg/m²   °Constitutional:  Alert and oriented, No acute distress. °HEENT: [place] AT, moist mucus membranes.  Trachea midline, no masses. °Cardiovascular: No clubbing, cyanosis, or edema. °Respiratory: Normal respiratory effort, no increased work of breathing. °Skin: No rashes, bruises or suspicious lesions. °Neurologic: Grossly intact, no focal deficits, moving all 4 extremities. °Psychiatric: Normal mood and affect. ° °Laboratory Data: ° °Lab Results  °Component Value Date  ° CREATININE 0.90 05/15/2021  ° °Lab Results  °Component Value Date  ° HGBA1C 4.6 (L) 09/28/2018  ° ° °Urinalysis ° ° °Pertinent Imaging: °CLINICAL DATA:  history of kidney stones °  °EXAM: °ABDOMEN - 1 VIEW °  °COMPARISON:  CT abdomen pelvis 10/18/2018 °  °FINDINGS: °The bowel gas pattern is normal. 1.3, 0.8, and 0.4 cm densities °overlying the right renal shadow suggestive of nephrolithiasis. °Severe degenerative changes of the lumbar spine with associated °levoscoliosis. °  °IMPRESSION: °Right nephrolithiasis measuring up to approximately 1.3 cm. °  °  °Electronically Signed °  By: Morgane  Naveau M.D. °  On: 05/06/2021 22:32 ° °KUB personally reviewed today, agree with radiologic interpretation.  Interval increase in right renal pelvic stone size. ° °Assessment & Plan:   ° °Right renal pelvic stone  °- Significant increase  in overall stone burden from 2  years ago ° °-Unclear whether or not this is contributing to recurrent UTIs, however given the size, would recommend consideration of treatment ° °- We discussed various treatment options for urolithiasis including observation with or without medical expulsive therapy, shockwave lithotripsy (SWL), ureteroscopy and laser lithotripsy with stent placement, and percutaneous nephrolithotomy. °  °We discussed that management is based on stone size, location, density, patient co-morbidities, and patient preference.  °  °SWL has a lower stone free rate in a single procedure, but also a lower complication rate compared to ureteroscopy and avoids a stent and associated stent related   symptoms. Possible complications include renal hematoma, steinstrasse, and need for additional treatment. We discussed the role of his increased skin to stone distance can lead to decreased efficacy with shockwave lithotripsy. °  °Ureteroscopy with laser lithotripsy and stent placement has a higher stone free rate than SWL in a single procedure, however increased complication rate including possible infection, ureteral injury, bleeding, and stent related morbidity. Common stent related symptoms include dysuria, urgency/frequency, and flank pain. °  °After an extensive discussion of the risks and benefits of the above treatment options, the patient would like to proceed with ureteroscopy, we will plan to do this on Monday at the time of Botox under general anesthesia. ° °2. Recurrent UTI  °- May be secondary to stone burden  °- Will send for pre-op culture  ° °Return for cystoscopy and ureteroscopy  ° °I,Kailey Littlejohn,acting as a scribe for Janthony Holleman, MD.,have documented all relevant documentation on the behalf of Anuar Walgren, MD,as directed by  Tamasha Laplante, MD while in the presence of Salomon Ganser, MD. ° °I have reviewed the above documentation for accuracy and completeness, and I agree with the above.  ° °Sharon Stapel,  MD ° ° °[facility] °[address], [address] °[place], [place] [address] °[phone] ° °

## 2021-05-19 NOTE — Progress Notes (Signed)
Office Visit    Patient Name: Tiffany Leon Date of Encounter: 05/20/2021  Primary Care Provider:  Pixie Casino, MD Primary Cardiologist:  Pixie Casino, MD  Chief Complaint    71 year old female with a history of coronary artery calcification on CT, chronic systolic heart failure with improved EF, aortic atherosclerosis, hyperlipidemia COPD, CKD stage III, hypothyroidism, Waldenstrom's macroglobulinemia, former tobacco abuse, anxiety, depression, and GERD who presents for follow-up related to heart failure and preoperative cardiac evaluation.  Past Medical History    Past Medical History:  Diagnosis Date   Acute kidney injury ([place])    a. 09/2018   Anxiety    Aortic atherosclerosis ([place])    a. 09/2018 noted on Chest CT.   Chronic combined systolic (congestive) and diastolic (congestive) heart failure ([place])    a.) 09/2018 Echo: EF 45-50%, diff HK. Triv to small circumfirential pericardial eff. b.) TTE 02/14/2020: EF 60-65%; no RWMAs; G1DD; GLS -23.3%   Chronic DOE (dyspnea on exertion)    Claudication (HCC)    a. Bilat hip claudication since ~ 2019.   COPD (chronic obstructive pulmonary disease) (HCC)    Coronary artery calcification seen on CT scan 09/20/2018   Depression    Dyspnea    Emphysema lung (HCC)    Exertional angina ([place])    a. Ex angina since ~ 2019.   GERD (gastroesophageal reflux disease)    Goals of care, counseling/discussion 11/03/2018   Hiatal hernia    History of 2019 novel coronavirus disease (COVID-19) 01/16/2021   History of bronchitis    History of kidney stones    Hypothyroidism    Pleural effusion    a. 09/2018 s/p thoracentesis-->99ml.   Resting tremor    Tobacco abuse    Waldenstrom's macroglobulinemia ([place]) 11/03/2018   Past Surgical History:  Procedure Laterality Date   BREAST LUMPECTOMY WITH RADIOACTIVE SEED LOCALIZATION Left 12/28/2017   Procedure: BREAST LUMPECTOMY WITH RADIOACTIVE SEED LOCALIZATION;  Surgeon: Jovita Kussmaul, MD;   Location: [facility];  Service: General;  Laterality: Left;   BREAST SURGERY     DG THUMB RIGHT HAND ([facility] HX)     IR IMAGING GUIDED PORT INSERTION  11/09/2018   IR THORACENTESIS ASP PLEURAL SPACE W/IMG GUIDE  09/20/2018   KIDNEY STONE SURGERY      Allergies  Allergies  Allergen Reactions   Codeine Nausea And Vomiting    History of Present Illness   71 year old female with above past medical history including coronary artery calcification on CT, chronic systolic heart failure with improved EF, aortic atherosclerosis, hyperlipidemia COPD, CKD stage III, hypothyroidism, Waldenstrom's macroglobulinemia, former tobacco abuse, anxiety, depression, and GERD.  She was hospitalized in June 2020 in the setting of chest pain, shortness of breath. CT of the chest at the time showed coronary artery calcifications, aortic atherosclerosis, bilateral pleural effusions.  Echocardiogram at the time showed an EF of 45 to 50%, elevated LVEDP, diffuse LV hypokinesis, normal RV function.  Lexiscan Myoview in October 2021 showed EF 50%, no evidence of ischemia. Repeat echocardiogram in October 2021 showed improved EF, 60 to 65%, no R WMA, G1 DD.  She was last seen in the office in February 2022 and was stable overall from a cardiac standpoint. Losartan was discontinued in the setting of hypotension. 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  She presents today for follow-up and for preoperative cardiac evaluation for cystoscopy, Botox injection at the request of Overton Mam, FNP. Since her  last visit she has been stable from a cardiac standpoint. She denies any symptoms concerning for angina. She does report generalized fatigue as well as stable chronic dyspnea on exertion. Of note, she does snore and states she was told before that she may need a sleep study. She has been taking her carvedilol once daily instead of twice daily as prescribed.  Overall, she thinks her symptoms  are stable and she denies any specific concerns or complaints today.  Home Medications    Current Outpatient Medications  Medication Sig Dispense Refill   Acetaminophen (ACETAMINOPHEN EXTRA STRENGTH) 500 MG capsule Take 1,000 mg by mouth every 6 (six) hours as needed for pain.     carvedilol (COREG) 12.5 MG tablet TAKE 1 TABLET (12.5 MG TOTAL) BY MOUTH 2 (TWO) TIMES DAILY WITH A MEAL. (Patient taking differently: Take 12.5 mg by mouth in the morning.) 180 tablet 3   clonazePAM (KLONOPIN) 0.5 MG tablet Take 1 tablet (0.5 mg total) by mouth 2 (two) times daily. 180 tablet 1   DULoxetine (CYMBALTA) 60 MG capsule Take 1 capsule (60 mg total) by mouth daily. 90 capsule 1   sulfamethoxazole-trimethoprim (BACTRIM DS) 800-160 MG tablet Take 1 tablet by mouth 2 (two) times daily. 10 tablet 0   traZODone (DESYREL) 100 MG tablet Take 1-1.5 tablets (100-150 mg total) by mouth at bedtime as needed for sleep. (Patient taking differently: Take 150 mg by mouth at bedtime.) 135 tablet 1   triamcinolone ointment (KENALOG) 0.1 % Apply 1 application topically daily as needed (eczema).     No current facility-administered medications for this visit.   Facility-Administered Medications Ordered in Other Visits  Medication Dose Route Frequency Provider Last Rate Last Admin   sodium chloride flush (NS) 0.9 % injection 10 mL  10 mL Intravenous PRN [name], MD   10 mL at 02/03/21 1210     Review of Systems    She denies chest pain, palpitations, pnd, orthopnea, n, v, dizziness, syncope, edema, weight gain, or early satiety. All other systems reviewed and are otherwise negative except as noted above.   Physical Exam    VS:  BP 130/89    Pulse 89    Ht 5' 5.5" (1.664 m)    Wt 132 lb (59.9 kg)    SpO2 97%    BMI 21.63 kg/m   GEN: Well nourished, well developed, in no acute distress. HEENT: normal. Neck: Supple, no JVD, carotid bruits, or masses. Cardiac: RRR, no murmurs, rubs, or gallops. No clubbing,  cyanosis, edema.  Radials/DP/PT 2+ and equal bilaterally.  Respiratory:  Respirations regular and unlabored, clear to auscultation bilaterally. GI: Soft, nontender, nondistended, BS + x 4. MS: no deformity or atrophy. Skin: warm and dry, no rash. Neuro:  Strength and sensation are intact. Psych: Normal affect.  Accessory Clinical Findings    ECG personally reviewed by me today - NSR, 89 bpm - no acute changes.  Lab Results  Component Value Date   WBC 3.9 (L) 05/15/2021   HGB 13.5 05/15/2021   HCT 41.4 05/15/2021   MCV 96.3 05/15/2021   PLT 255 05/15/2021   Lab Results  Component Value Date   CREATININE 0.90 05/15/2021   BUN 16 05/15/2021   NA 137 05/15/2021   K 3.8 05/15/2021   CL 105 05/15/2021   CO2 25 05/15/2021   Lab Results  Component Value Date   ALT 8 02/03/2021   AST 11 (L) 02/03/2021   ALKPHOS 85 02/03/2021   BILITOT  0.3 02/03/2021   Lab Results  Component Value Date   CHOL 121 09/28/2018   HDL 29 (L) 09/28/2018   LDLCALC 72 09/28/2018   TRIG 102 09/28/2018   CHOLHDL 4.2 09/28/2018    Lab Results  Component Value Date   HGBA1C 4.6 (L) 09/28/2018    Assessment & Plan    1. Coronary artery calcification/aortic atherosclerosis:  CT of the chest in June 2020 in setting of chest pain, sob, showed coronary artery calcifications, aortic atherosclerosis. Echo at the time showed EF of 45 to 50%, elevated LVEDP, diffuse LV hypokinesis, normal RV function. Lexiscan Myoview in October 2021 with no evidence of ischemia, EF 50%. Most recent echo in October 2021 showed improved EF, 60 to 65%, no R WMA, G1 DD. Losartan was discontinued in the setting of hypotension. Stable with no anginal symptoms. No indication for ischemic evaluation.   2. Chronic systolic heart failure with improved EF: Most recent echo as above. Euvolemic and well compensated on exam. She does have chronic dyspnea likely multifactorial in the setting of long-term tobacco use, COPD, and physical  deconditioning.  He has only been taking her carvedilol once daily. She states that she decided to do this since her blood pressure was well controlled and her heart pumping function was improved.  I advised her to resume carvedilol 12.5 twice daily and monitor blood pressure and report worsening fatigue, SBP consistently < 110, HR <60 bpm.   3. COPD/chronic dyspnea on exertion/generalized fatigue/snoring: She does have stable, chronic dyspnea on exertion, which is unchanged. She also reports increased fatigue. Epworth Scale score is 14, STOP-Bang 4. Will refer for sleep study. Additionally, she has not seen a pulmonologist in several years.  She is still smoking. Will refer her to pulmonology for follow-up of COPD, chronic dyspnea.   4. Hyperlipidemia: LDL was 72 in June 2020. She plans to have repeat lipids through her PCP.  She is not on a statin.  5. CKD stage III: Crt was 0.90 in January 2023, stable.   6. [place] Amber Strom's macroglobulinemia: S/p chemotherapy. Follows with oncology.   7. Preoperative cardiac evaluation: According to the Revised Cardiac Risk Index (RCRI), her Perioperative Risk of Major Cardiac Event is (%): 0.9. Her Functional Capacity in METs is: 4.06 according to the [employer] Activity Status Index (DASI).  Her functional capacity is somewhat limited in the setting of chronic dyspnea.  Recommend follow-up with pulmonology. However, based on ACC/AHA guidelines, patient would be at acceptable risk for the planned procedure without further cardiovascular testing. I will route this recommendation to the requesting party via Epic fax function.  8. Disposition: Follow-up in 1 year.   Lenna Sciara, NP 05/20/2021, 10:05 AM

## 2021-05-19 NOTE — Progress Notes (Signed)
05/20/21 11:31 AM   Tiffany Leon 1950-10-04 [id number]  Referring provider:  Pixie Casino, MD [address] [place],  [place] [address] Chief Complaint  Patient presents with   Nephrolithiasis     HPI: Tiffany Leon is a 71 y.o.female with a personal history of OAB and UTIs, who presents today to discuss stone treatment.   She has a past medical history of Waldenstrom macroglobulinemia, COPD, stage III chronic kidney disease, kidney stones, and congestive heart failure.   CT scan in 2020 visualized at 6 mm stone in the right renal pelvis without hydronephrosis.  She noted that she has had to have procedures for this in the past.   KUB on 05/06/2021 revealed right nephrolithiasis measuring up to approximately 1.3 cm.   She reports that she has had increased back pain.   She is scheduled Monday in the OR for botox under MAC for OAB.  PMH: Past Medical History:  Diagnosis Date   Acute kidney injury ([place])    a. 09/2018   Anxiety    Aortic atherosclerosis ([place])    a. 09/2018 noted on Chest CT.   Chronic combined systolic (congestive) and diastolic (congestive) heart failure ([place])    a.) 09/2018 Echo: EF 45-50%, diff HK. Triv to small circumfirential pericardial eff. b.) TTE 02/14/2020: EF 60-65%; no RWMAs; G1DD; GLS -23.3%   Chronic DOE (dyspnea on exertion)    Claudication (HCC)    a. Bilat hip claudication since ~ 2019.   COPD (chronic obstructive pulmonary disease) (HCC)    Coronary artery calcification seen on CT scan 09/20/2018   Depression    Dyspnea    Emphysema lung (HCC)    Exertional angina ([place])    a. Ex angina since ~ 2019.   GERD (gastroesophageal reflux disease)    Goals of care, counseling/discussion 11/03/2018   Hiatal hernia    History of 2019 novel coronavirus disease (COVID-19) 01/16/2021   History of bronchitis    History of kidney stones    Hypothyroidism    Pleural effusion    a. 09/2018 s/p thoracentesis-->940m.   Resting tremor     Tobacco abuse    [place] 11/03/2018    Surgical History: Past Surgical History:  Procedure Laterality Date   BREAST LUMPECTOMY WITH RADIOACTIVE SEED LOCALIZATION Left 12/28/2017   Procedure: BREAST LUMPECTOMY WITH RADIOACTIVE SEED LOCALIZATION;  Surgeon: TJovita Kussmaul MD;  [place]  Service: General;  Laterality: Left;   BREAST SURGERY     DG THUMB RIGHT HAND ([facility])     IR IMAGING GUIDED PORT INSERTION  11/09/2018   IR THORACENTESIS ASP PLEURAL SPACE W/IMG GUIDE  09/20/2018   KIDNEY STONE SURGERY      Home Medications:  Allergies as of 05/20/2021       Reactions   Codeine Nausea And Vomiting        Medication List        Accurate as of May 20, 2021 11:31 AM. If you have any questions, ask your nurse or doctor.          STOP taking these medications    sulfamethoxazole-trimethoprim 800-160 MG tablet Commonly known as: BACTRIM DS Stopped by: AHollice Espy MD       TAKE these medications    Acetaminophen Extra Strength 500 MG capsule Generic drug: Acetaminophen Take 1,000 mg by mouth every 6 (six) hours as needed for pain.   carvedilol 12.5 MG tablet Commonly known as: COREG TAKE 1 TABLET (  12.5 MG TOTAL) BY MOUTH 2 (TWO) TIMES DAILY WITH A MEAL. What changed: when to take this   clonazePAM 0.5 MG tablet Commonly known as: KLONOPIN Take 1 tablet (0.5 mg total) by mouth 2 (two) times daily.   DULoxetine 60 MG capsule Commonly known as: CYMBALTA Take 1 capsule (60 mg total) by mouth daily.   traZODone 100 MG tablet Commonly known as: DESYREL Take 1-1.5 tablets (100-150 mg total) by mouth at bedtime as needed for sleep. What changed:  how much to take when to take this   triamcinolone ointment 0.1 % Commonly known as: KENALOG Apply 1 application topically daily as needed (eczema).        Allergies:  Allergies  Allergen Reactions   Codeine Nausea And Vomiting    Family History: Family History   Problem Relation Age of Onset   Stroke Mother        Mini-strokes. Died @ 30.   Dementia Mother    Lung cancer Father        Died in his 17's   Addison's disease Sister    Hypertension Brother    CAD Brother    Hypertension Brother     Social History:  reports that she has been smoking cigarettes. She has a 20.00 pack-year smoking history. She has never used smokeless tobacco. She reports that she does not currently use alcohol. She reports that she does not currently use drugs.   Physical Exam: BP 117/80    Pulse 76    Ht 5' 5.5" (1.664 m)    Wt 132 lb (59.9 kg)    BMI 21.63 kg/m   Constitutional:  Alert and oriented, No acute distress. HEENT: [place] AT, moist mucus membranes.  Trachea midline, no masses. Cardiovascular: No clubbing, cyanosis, or edema. Respiratory: Normal respiratory effort, no increased work of breathing. Skin: No rashes, bruises or suspicious lesions. Neurologic: Grossly intact, no focal deficits, moving all 4 extremities. Psychiatric: Normal mood and affect.  Laboratory Data:  Lab Results  Component Value Date   CREATININE 0.90 05/15/2021   Lab Results  Component Value Date   HGBA1C 4.6 (L) 09/28/2018    Urinalysis   Pertinent Imaging: CLINICAL DATA:  history of kidney stones   EXAM: ABDOMEN - 1 VIEW   COMPARISON:  CT abdomen pelvis 10/18/2018   FINDINGS: The bowel gas pattern is normal. 1.3, 0.8, and 0.4 cm densities overlying the right renal shadow suggestive of nephrolithiasis. Severe degenerative changes of the lumbar spine with associated levoscoliosis.   IMPRESSION: Right nephrolithiasis measuring up to approximately 1.3 cm.     Electronically Signed   By: Iven Finn M.D.   On: 05/06/2021 22:32  KUB personally reviewed today, agree with radiologic interpretation.  Interval increase in right renal pelvic stone size.  Assessment & Plan:    Right renal pelvic stone  - Significant increase  in overall stone burden from 2  years ago  -Unclear whether or not this is contributing to recurrent UTIs, however given the size, would recommend consideration of treatment  - We discussed various treatment options for urolithiasis including observation with or without medical expulsive therapy, shockwave lithotripsy (SWL), ureteroscopy and laser lithotripsy with stent placement, and percutaneous nephrolithotomy.   We discussed that management is based on stone size, location, density, patient co-morbidities, and patient preference.    SWL has a lower stone free rate in a single procedure, but also a lower complication rate compared to ureteroscopy and avoids a stent and associated stent related  symptoms. Possible complications include renal hematoma, steinstrasse, and need for additional treatment. We discussed the role of his increased skin to stone distance can lead to decreased efficacy with shockwave lithotripsy.   Ureteroscopy with laser lithotripsy and stent placement has a higher stone free rate than SWL in a single procedure, however increased complication rate including possible infection, ureteral injury, bleeding, and stent related morbidity. Common stent related symptoms include dysuria, urgency/frequency, and flank pain.   After an extensive discussion of the risks and benefits of the above treatment options, the patient would like to proceed with ureteroscopy, we will plan to do this on Monday at the time of Botox under general anesthesia.  2. Recurrent UTI  - May be secondary to stone burden  - Will send for pre-op culture   Return for cystoscopy and ureteroscopy   I,Kailey Littlejohn,acting as a scribe for Hollice Espy, MD.,have documented all relevant documentation on the behalf of Hollice Espy, MD,as directed by  Hollice Espy, MD while in the presence of Hollice Espy, MD.  I have reviewed the above documentation for accuracy and completeness, and I agree with the above.   Hollice Espy,  MD   [facility] [address] [place], [place] [address] [phone]

## 2021-05-19 NOTE — Progress Notes (Signed)
Perioperative Services  Pre-Admission/Anesthesia Testing Clinical Review  Date: 05/20/21  Patient Demographics:  Name: Tiffany Leon DOB:   Jan 06, 1951 MRN:   [id number]  Planned Surgical Procedure(s):    Case: [id number] Date/Time: 05/25/21 1351   Procedures:      CYSTOSCOPY     BOTOX INJECTION   Anesthesia type: Monitor Anesthesia Care   Pre-op diagnosis: Cysto   Location: [facility] [facility]   Surgeons: Hollice Espy, MD   NOTE: Available PAT nursing documentation and vital signs have been reviewed. Clinical nursing staff has updated patient's PMH/PSHx, current medication list, and drug allergies/intolerances to ensure comprehensive history available to assist in medical decision making as it pertains to the aforementioned surgical procedure and anticipated anesthetic course. Extensive review of available clinical information performed. [place] PMH and PSHx updated with any diagnoses/procedures that  may have been inadvertently omitted during her intake with the pre-admission testing department's nursing staff.  Clinical Discussion:  Tiffany Leon is a 71 y.o. female who is submitted for pre-surgical anesthesia review and clearance prior to her undergoing the above procedure. Patient is a Current Smoker (20 pack years). Pertinent PMH includes: coronary artery calcifications, CHF, aortic atherosclerosis, exertional angina, claudication, hypothyroidism, DOE, COPD, GERD (no daily Tx), hiatal hernia, Waldenstrom's macroglobulinemia, tremor, nephrolithiasis, anxiety (on BZO), depression.  Patient is followed by cardiology Debara Pickett, MD). She was last seen in the cardiology clinic on 05/20/2021; notes reviewed.  At the time of her clinic visit, patient doing well overall from a cardiovascular perspective.  She denied any episodes of chest pain, however she had chronic exertional dyspnea.  Patient reported that she continues to snore and has been told in the past that she  may need a PSG to evaluate for OSAH syndrome.  Patient denied any PND, orthopnea, palpitations, significant peripheral edema, vertiginous symptoms, or presyncope/syncope.  Patient with complaints of generalized fatigue.  She reported to cardiology that she has been taking her prescribed beta-blocker therapy (carvedilol) once daily instead of twice daily as prescribed. Past medical history significant for cardiovascular diagnoses.  TTE performed on 09/20/2018 revealed mildly reduced left ventricular systolic function with a reduced EF of 45-50%.  RV SF normal.  There were no wall motion abnormalities. There is no evidence of significant valvular regurgitation or transvalvular gradient suggestive of stenosis.     Patient with both coronary and aortic atherosclerosis noted on CT scan performed on 09/20/2018.  Myocardial perfusion imaging study performed on 02/01/2019 revealed a mildly reduced left ventricular systolic function with an EF of 45-54%; stress EF 50%.  There was mild global hypokinesis noted.  There was no evidence of stress-induced myocardial ischemia or arrhythmia.  Study determined to be mildly abnormal and low risk overall.  Repeat TTE performed on 02/14/2020 revealed a normal left ventricular systolic function with an EF of 60 to 65%.  There were no regional wall motion abnormalities.  Diastolic Doppler parameters consistent with impaired relaxation (G1DD). GLS -23.3%. RVSF normal with an estimated RVSP of 28.0 mmHg. There was no evidence of significant valvular regurgitation or stenosis.  Blood pressure reasonably controlled at 130/89 on currently prescribed beta-blocker monotherapy.  Patient is not currently taking any type of lipid-lowering therapies for ASCVD prevention.  She is not diabetic.  Functional capacity somewhat limited in the setting of chronic dyspnea; follow-up with pulmonology recommended. DASI defined functional capacity 4.06 METS.  No changes were made to her medication  regimen.  Patient scheduled for routine follow-up with outpatient cardiology  in 12 months or sooner if needed.  Tiffany Leon is scheduled for an CYSTOSCOPY; BOTOX INJECTION on 05/25/2021 with Dr. Hollice Espy, MD. Given patient's past medical history significant for cardiovascular diagnoses, presurgical cardiac clearance was sought by the PAT team. Per cardiology, "RCRI placed patient at a 0.9% risk of MACE. DASI defined function capacity if 4.06 METS. Based on ACC/AHA guidelines, patient would be at ACCEPTABLE risk for the planned procedure without further cardiovascular testing". In review of her medication reconciliation, it is noted the patient is not currently taking any type of anticoagulation or antiplatelet therapies that need to be held during the perioperative period.  Patient denies previous perioperative complications with anesthesia in the past. In review of the available records, it is noted that patient underwent a general anesthetic course at [facility] ([facility] in 12/2017 without documented complications.   Vitals with BMI 05/20/2021 05/20/2021 05/15/2021  Height 5' 5.5" 5' 5.5" 5' 5.5"  Weight 132 lbs 132 lbs -  BMI 32.35 57.32 -  Systolic 202 542 706  Diastolic 80 89 79  Pulse 76 89 84    Providers/Specialists:   NOTE: Primary physician provider listed below. Patient may have been seen by APP or partner within same practice.   PROVIDER ROLE / SPECIALTY LAST Lu Duffel, MD Urology (Surgeon) 05/06/2021  Pixie Casino, MD Primary Care Provider ???  Pixie Casino, MD Cardiology 05/20/2021  Burney Gauze, MD Hematology/Oncology 02/03/2021   Allergies:  Codeine  Current Home Medications:   No current facility-administered medications for this encounter.    Acetaminophen (ACETAMINOPHEN EXTRA STRENGTH) 500 MG capsule   carvedilol (COREG) 12.5 MG tablet   clonazePAM (KLONOPIN) 0.5 MG tablet   DULoxetine (CYMBALTA) 60 MG capsule   traZODone (DESYREL)  100 MG tablet   triamcinolone ointment (KENALOG) 0.1 %    sodium chloride flush (NS) 0.9 % injection 10 mL   History:   Past Medical History:  Diagnosis Date   Acute kidney injury ([place])    a. 09/2018   Anxiety    Aortic atherosclerosis ([place])    a. 09/2018 noted on Chest CT.   Chronic combined systolic (congestive) and diastolic (congestive) heart failure ([place])    a.) 09/2018 Echo: EF 45-50%, diff HK. Triv to small circumfirential pericardial eff. b.) TTE 02/14/2020: EF 60-65%; no RWMAs; G1DD; GLS -23.3%   Chronic DOE (dyspnea on exertion)    Claudication (HCC)    a. Bilat hip claudication since ~ 2019.   COPD (chronic obstructive pulmonary disease) (HCC)    Coronary artery calcification seen on CT scan 09/20/2018   Depression    Dyspnea    Emphysema lung (HCC)    Exertional angina ([place])    a. Ex angina since ~ 2019.   GERD (gastroesophageal reflux disease)    Goals of care, counseling/discussion 11/03/2018   Hiatal hernia    History of 2019 novel coronavirus disease (COVID-19) 01/16/2021   History of bronchitis    History of kidney stones    Hypothyroidism    Pleural effusion    a. 09/2018 s/p thoracentesis-->942ml.   Resting tremor    Tobacco abuse    Waldenstrom's macroglobulinemia ([place]) 11/03/2018   Past Surgical History:  Procedure Laterality Date   BREAST LUMPECTOMY WITH RADIOACTIVE SEED LOCALIZATION Left 12/28/2017   Procedure: BREAST LUMPECTOMY WITH RADIOACTIVE SEED LOCALIZATION;  Surgeon: Jovita Kussmaul, MD;  Location: [place];  Service: General;  Laterality: Left;   BREAST SURGERY     DG  THUMB RIGHT HAND ([facility])     IR IMAGING GUIDED PORT INSERTION  11/09/2018   IR THORACENTESIS ASP PLEURAL SPACE W/IMG GUIDE  09/20/2018   KIDNEY STONE SURGERY     Family History  Problem Relation Age of Onset   Stroke Mother        Mini-strokes. Died @ 84.   Dementia Mother    Lung cancer Father        Died in his 44's   Addison's disease Sister    Hypertension Brother     CAD Brother    Hypertension Brother    Social History   Tobacco Use   Smoking status: Every Day    Packs/day: 0.50    Years: 40.00    Pack years: 20.00    Types: Cigarettes   Smokeless tobacco: Never  Vaping Use   Vaping Use: Never used  Substance Use Topics   Alcohol use: Not Currently    Comment: 1-2 gl wine / night - none since 07/2018   Drug use: Not Currently    Comment: prev used drugs ~ 35 yrs ago.    Pertinent Clinical Results:  LABS: Labs reviewed: Acceptable for surgery.  No visits with results within 3 Day(s) from this visit.  Latest known visit with results is:  Hospital Outpatient Visit on 05/15/2021  Component Date Value Ref Range Status   Sodium 05/15/2021 137  135 - 145 mmol/L Final   Potassium 05/15/2021 3.8  3.5 - 5.1 mmol/L Final   Chloride 05/15/2021 105  98 - 111 mmol/L Final   CO2 05/15/2021 25  22 - 32 mmol/L Final   Glucose, Bld 05/15/2021 86  70 - 99 mg/dL Final   Glucose reference range applies only to samples taken after fasting for at least 8 hours.   BUN 05/15/2021 16  8 - 23 mg/dL Final   Creatinine, Ser 05/15/2021 0.90  0.44 - 1.00 mg/dL Final   Calcium 05/15/2021 9.2  8.9 - 10.3 mg/dL Final   GFR, Estimated 05/15/2021 >60  >60 mL/min Final   Comment: (NOTE) Calculated using the CKD-EPI Creatinine Equation (2021)    Anion gap 05/15/2021 7  5 - 15 Final   Performed at [facility], [address] [place], [place] [address]   WBC 05/15/2021 3.9 (L)  4.0 - 10.5 K/uL Final   RBC 05/15/2021 4.30  3.87 - 5.11 MIL/uL Final   Hemoglobin 05/15/2021 13.5  12.0 - 15.0 g/dL Final   HCT 05/15/2021 41.4  36.0 - 46.0 % Final   MCV 05/15/2021 96.3  80.0 - 100.0 fL Final   MCH 05/15/2021 31.4  26.0 - 34.0 pg Final   MCHC 05/15/2021 32.6  30.0 - 36.0 g/dL Final   RDW 05/15/2021 12.7  11.5 - 15.5 % Final   Platelets 05/15/2021 255  150 - 400 K/uL Final   nRBC 05/15/2021 0.0  0.0 - 0.2 % Final   Performed at [facility], [address] [place], [place] [address]    ECG: Date: 05/15/2021 Time ECG obtained: 1043 AM Rate: 84 bpm Rhythm: normal sinus Axis (leads I and aVF): Normal Intervals: PR 132 ms. QRS 86 ms. QTc 413 ms. ST segment and T wave changes: Nonspecific T wave abnormality Comparison: Similar to previous tracing obtained on 06/12/2020   IMAGING / PROCEDURES: TRANSTHORACIC ECHOCARDIOGRAM performed on 02/14/2020 Left ventricular ejection fraction, by estimation, is 60 to 65%. Left ventricular ejection fraction by 3D volume is 60 %. The left ventricle has  normal function. The left ventricle has no regional wall motion abnormalities. Left ventricular diastolic parameters are consistent with Grade I diastolic dysfunction (impaired relaxation). The average left ventricular global longitudinal strain is -23.3 %. The global longitudinal strain is normal.  Prominent RV epicardial fat is present. Right ventricular systolic function is normal. The right ventricular size is normal. There is normal pulmonary artery systolic pressure. The estimated right ventricular systolic pressure is 18.5 mmHg.  The mitral valve is grossly normal. No evidence of mitral valve regurgitation. No evidence of mitral stenosis.  The aortic valve is tricuspid. Aortic valve regurgitation is not visualized. No aortic stenosis is present.  The inferior vena cava is normal in size with greater than 50% respiratory variability, suggesting right atrial pressure of 3 mmHg.   MYOCARDIAL PERFUSION IMAGING STUDY (LEXISCAN) performed on 02/01/2019 The left ventricular ejection fraction is mildly decreased at 45-54% Nuclear stress EF 50%  No ST segment deviation noted during stress  Mild global hypokinesis  Mildly abnormal low risk nuclear study   Impression and Plan:  Tiffany Leon has been referred for pre-anesthesia review and clearance prior to her undergoing the planned anesthetic and procedural courses. Available labs, pertinent testing, and  imaging results were personally reviewed by me. This patient has been appropriately cleared by cardiology with an overall ACCEPTABLE risk of significant perioperative cardiovascular complications.  Based on clinical review performed today (05/20/21), barring any significant acute changes in the patient's overall condition, it is anticipated that she will be able to proceed with the planned surgical intervention. Any acute changes in clinical condition may necessitate her procedure being postponed and/or cancelled. Patient will meet with anesthesia team (MD and/or CRNA) on the day of her procedure for preoperative evaluation/assessment. Questions regarding anesthetic course will be fielded at that time.   Pre-surgical instructions were reviewed with the patient during her PAT appointment and questions were fielded by PAT clinical staff. Patient was advised that if any questions or concerns arise prior to her procedure then she should return a call to PAT and/or her surgeon's office to discuss.  Honor Loh, MSN, APRN, FNP-C, CEN [facility]  Peri-operative Services Nurse Practitioner Phone: [phone] Fax: [phone] 05/20/21 12:32 PM  NOTE: This note has been prepared using Dragon dictation software. Despite my best ability to proofread, there is always the potential that unintentional transcriptional errors may still occur from this process.

## 2021-05-19 NOTE — Telephone Encounter (Signed)
Primary Cardiologist:Kenneth C Hilty, MD  Chart reviewed as part of pre-operative protocol coverage. Because of Lorinda Ladouceur's past medical history and time since last visit, he/she will require a follow-up visit in order to better assess preoperative cardiovascular risk.  Pre-op covering staff: - Please schedule appointment and call patient to inform them. - Please contact requesting surgeon's office via preferred method (i.e, phone, fax) to inform them of need for appointment prior to surgery.  If applicable, this message will also be routed to pharmacy pool and/or primary cardiologist for input on holding anticoagulant/antiplatelet agent as requested below so that this information is available at time of patient's appointment.   Deberah Pelton, NP  05/19/2021, 8:02 AM

## 2021-05-20 ENCOUNTER — Ambulatory Visit: Payer: Medicare Other | Admitting: Urology

## 2021-05-20 ENCOUNTER — Ambulatory Visit: Payer: Medicare Other | Admitting: Nurse Practitioner

## 2021-05-20 ENCOUNTER — Other Ambulatory Visit: Payer: Self-pay

## 2021-05-20 ENCOUNTER — Encounter: Payer: Self-pay | Admitting: Physician Assistant

## 2021-05-20 VITALS — BP 117/80 | HR 76 | Ht 65.5 in | Wt 132.0 lb

## 2021-05-20 VITALS — BP 130/89 | HR 89 | Ht 65.5 in | Wt 132.0 lb

## 2021-05-20 DIAGNOSIS — J42 Unspecified chronic bronchitis: Secondary | ICD-10-CM

## 2021-05-20 DIAGNOSIS — R400 Somnolence: Secondary | ICD-10-CM

## 2021-05-20 DIAGNOSIS — I700 Atherosclerosis of aorta: Secondary | ICD-10-CM | POA: Diagnosis not present

## 2021-05-20 DIAGNOSIS — C880 Waldenstrom macroglobulinemia: Secondary | ICD-10-CM

## 2021-05-20 DIAGNOSIS — R0683 Snoring: Secondary | ICD-10-CM

## 2021-05-20 DIAGNOSIS — I5042 Chronic combined systolic (congestive) and diastolic (congestive) heart failure: Secondary | ICD-10-CM | POA: Diagnosis not present

## 2021-05-20 DIAGNOSIS — N3281 Overactive bladder: Secondary | ICD-10-CM

## 2021-05-20 DIAGNOSIS — I2510 Atherosclerotic heart disease of native coronary artery without angina pectoris: Secondary | ICD-10-CM | POA: Diagnosis not present

## 2021-05-20 DIAGNOSIS — N200 Calculus of kidney: Secondary | ICD-10-CM | POA: Diagnosis not present

## 2021-05-20 DIAGNOSIS — Z01818 Encounter for other preprocedural examination: Secondary | ICD-10-CM | POA: Diagnosis not present

## 2021-05-20 DIAGNOSIS — E785 Hyperlipidemia, unspecified: Secondary | ICD-10-CM

## 2021-05-20 DIAGNOSIS — N183 Chronic kidney disease, stage 3 unspecified: Secondary | ICD-10-CM

## 2021-05-20 DIAGNOSIS — R5382 Chronic fatigue, unspecified: Secondary | ICD-10-CM

## 2021-05-20 DIAGNOSIS — I2584 Coronary atherosclerosis due to calcified coronary lesion: Secondary | ICD-10-CM

## 2021-05-20 DIAGNOSIS — J449 Chronic obstructive pulmonary disease, unspecified: Secondary | ICD-10-CM

## 2021-05-20 NOTE — Patient Instructions (Signed)
Ureteroscopy °Ureteroscopy is a procedure to check for and treat problems inside part of the urinary tract. In this procedure, a thin, flexible tube with a light at the end (ureteroscope) is used to look at the inside of the kidneys and the ureters. The ureters are the tubes that carry urine from the kidneys to the bladder. The ureteroscope is inserted into one or both of the ureters. °You may need this procedure if you have frequent urinary tract infections (UTIs), blood in your urine, or a stone in one of your ureters. A ureteroscopy can be done: °To find the cause of urine blockage in a ureter and to evaluate other abnormalities inside the ureters or kidneys. °To remove stones. °To remove or treat growths of tissue (polyps), abnormal tissue, and some types of tumors. °To remove a tissue sample and check it for disease under a microscope (biopsy). °Tell a health care provider about: °Any allergies you have. °All medicines you are taking, including vitamins, herbs, eye drops, creams, and over-the-counter medicines. °Any problems you or family members have had with anesthetic medicines. °Any blood disorders you have. °Any surgeries you have had. °Any medical conditions you have. °Whether you are pregnant or may be pregnant. °What are the risks? °Generally, this is a safe procedure. However, problems may occur, including: °Bleeding. °Infection. °Allergic reactions to medicines. °Scarring that narrows the ureter (stricture). °Creating a hole in the ureter (perforation). °What happens before the procedure? °Staying hydrated °Follow instructions from your health care provider about hydration, which may include: °Up to 2 hours before the procedure - you may continue to drink clear liquids, such as water, clear fruit juice, black coffee, and plain tea. ° °Eating and drinking restrictions °Follow instructions from your health care provider about eating and drinking, which may include: °8 hours before the procedure - stop  eating heavy meals or foods, such as meat, fried foods, or fatty foods. °6 hours before the procedure - stop eating light meals or foods, such as toast or cereal. °6 hours before the procedure - stop drinking milk or drinks that contain milk. °2 hours before the procedure - stop drinking clear liquids. °Medicines °Ask your health care provider about: °Changing or stopping your regular medicines. This is especially important if you are taking diabetes medicines or blood thinners. °Taking medicines such as aspirin and ibuprofen. These medicines can thin your blood. Do not take these medicines unless your health care provider tells you to take them. °Taking over-the-counter medicines, vitamins, herbs, and supplements. °General instructions °Do not use any products that contain nicotine or tobacco for at least 4 weeks before the procedure. These products include cigarettes, e-cigarettes, and chewing tobacco. If you need help quitting, ask your health care provider. °You may have a urine sample taken to check for infection. °Plan to have someone take you home from the hospital or clinic. °If you will be going home right after the procedure, plan to have someone with you for 24 hours. °Ask your health care provider what steps will be taken to help prevent infection. These may include: °Washing skin with a germ-killing soap. °Receiving antibiotic medicine. °What happens during the procedure? ° °An IV will be inserted into one of your veins. °You will be given one or more of the following: °A medicine to help you relax (sedative). °A medicine to make you fall asleep (general anesthetic). °A medicine that is injected into your spine to numb the area below and slightly above the injection site (spinal anesthetic). °The   part of your body that drains urine from your bladder (urethra) will be cleaned with a germ-killing solution. °The ureteroscope will be passed through your urethra into your bladder. °A salt-water solution will  be sent through the ureteroscope to fill your bladder. This will help the health care provider see the openings of your ureters more clearly. °The ureteroscope will be passed into your ureter. °If a growth is found, a biopsy may be done. °If a stone is found, it may be removed through the ureteroscope, or the stone may be broken up using a laser, shock waves, or electrical energy. °In some cases, if the ureter is too small, a tube may be inserted that keeps the ureter open (ureteral stent). The stent may be left in place for 1 or 2 weeks to keep the ureter open, and then the ureteroscopy procedure will be done. °The scope will be removed, and your bladder will be emptied. °The procedure may vary among health care providers and hospitals. °What can I expect after the procedure? °After your procedure, it is common to have: °Your blood pressure, heart rate, breathing rate, and blood oxygen level monitored until you leave the hospital or clinic. °A burning sensation when you urinate. You may be asked to urinate. °Blood in your urine. °Mild discomfort in your bladder area or kidney area when urinating. °A need to urinate more often or urgently. °Follow these instructions at home: °Medicines °Take over-the-counter and prescription medicines only as told by your health care provider. °If you were prescribed an antibiotic medicine, take it as told by your health care provider. Do not stop taking the antibiotic even if you start to feel better. °General instructions ° °If you were given a sedative during the procedure, it can affect you for several hours. Do not drive or operate machinery until your health care provider says that it is safe. °To relieve burning, take a warm bath or hold a warm washcloth over your groin. °Drink enough fluid to keep your urine pale yellow. °Drink two 8-ounce (237 mL) glasses of water every hour for the first 2 hours after you get home. °Continue to drink water often at home. °You can eat what  you normally do. °Keep all follow-up visits as told by your health care provider. This is important. °If you had a ureteral stent placed, ask your health care provider when you need to return to have it removed. °Contact a health care provider if you have: °Chills or a fever. °Burning pain for longer than 24 hours after the procedure. °Blood in your urine for longer than 24 hours after the procedure. °Get help right away if you have: °Large amounts of blood in your urine. °Blood clots in your urine. °Severe pain. °Chest pain or trouble breathing. °The feeling of a full bladder and you are unable to urinate. °These symptoms may represent a serious problem that is an emergency. Do not wait to see if the symptoms will go away. Get medical help right away. Call your local emergency services (911 in the [place] °Summary °Ureteroscopy is a procedure to check for and treat problems inside part of the urinary tract. °In this procedure, a thin, flexible tube with a light at the end (ureteroscope) is used to look at the inside of the kidneys and the ureters. °You may need this procedure if you have frequent urinary tract infections (UTIs), blood in your urine, or a stone in a ureter. °This information is not intended to replace advice given to   you by your health care provider. Make sure you discuss any questions you have with your health care provider. °Document Revised: 12/17/2020 Document Reviewed: 01/10/2019 °Elsevier Patient Education © 2022 [employer] ° °

## 2021-05-20 NOTE — Patient Instructions (Addendum)
Medication Instructions:  Take Carvdeilol (Coreg) 12.5 mg 2 times a day  *If you need a refill on your cardiac medications before your next appointment, please call your pharmacy*  Lab Work: NONE ordered at this time of appointment   If you have labs (blood work) drawn today and your tests are completely normal, you will receive your results only by: [place] (if you have MyChart) OR A paper copy in the mail If you have any lab test that is abnormal or we need to change your treatment, we will call you to review the results.  Testing/Procedures: Your physician has recommended that you have a sleep study. This test records several body functions during sleep, including: brain activity, eye movement, oxygen and carbon dioxide blood levels, heart rate and rhythm, breathing rate and rhythm, the flow of air through your mouth and nose, snoring, body muscle movements, and chest and belly movement.   You have been referred to Pulmonology    Follow-Up: At [facility], you and your health needs are our priority.  As part of our continuing mission to provide you with exceptional heart care, we have created designated Provider Care Teams.  These Care Teams include your primary Cardiologist (physician) and Advanced Practice Providers (APPs -  Physician Assistants and Nurse Practitioners) who all work together to provide you with the care you need, when you need it.  Your next appointment:   1 year(s)  The format for your next appointment:   In Person  Provider:   Pixie Casino, MD    Other Instructions MONITOR Blood Pressure (BP) and Heart Rate (HR) at home daily for the next 1-3 weeks. If your systolic blood pressure (top number) is less than 110 or HR is less than 60 persistently give our office a call or with increased fatigue.

## 2021-05-21 ENCOUNTER — Telehealth: Payer: Self-pay | Admitting: Urology

## 2021-05-21 NOTE — Addendum Note (Signed)
Addended by: Gerald Leitz A on: 05/21/2021 11:30 AM   Modules accepted: Orders

## 2021-05-21 NOTE — Telephone Encounter (Signed)
Plan for addition of right ureteroscopy, laser lithotripsy and right ureteral stent at the time of Botox which is scheduled for next week.  This will need to be done under general anesthesia rather than MAC.  All other orders are unchanged.  Hollice Espy, MD

## 2021-05-21 NOTE — Telephone Encounter (Signed)
All orders updated and prior auth approved.

## 2021-05-24 MED ORDER — CHLORHEXIDINE GLUCONATE 0.12 % MT SOLN: 15.0000 mL | Freq: Once | OROMUCOSAL | Status: AC

## 2021-05-24 MED ORDER — ONABOTULINUMTOXINA 100 UNITS IJ SOLR
100.0000 [IU] | Freq: Once | INTRAMUSCULAR | Status: DC
  Filled 2021-05-24: qty 100

## 2021-05-24 MED ORDER — ORAL CARE MOUTH RINSE
15.0000 mL | Freq: Once | OROMUCOSAL | Status: AC
Start: 2021-05-25 — End: 2021-05-25

## 2021-05-24 MED ORDER — FAMOTIDINE 20 MG PO TABS: 20.0000 mg | ORAL_TABLET | Freq: Once | ORAL | Status: AC

## 2021-05-24 MED ORDER — SODIUM CHLORIDE 0.9 % IV SOLN: INTRAVENOUS | Status: DC

## 2021-05-24 MED ORDER — CEFAZOLIN SODIUM-DEXTROSE 2-4 GM/100ML-% IV SOLN
2.0000 g | INTRAVENOUS | Status: AC
  Administered 2021-05-25: 2 g via INTRAVENOUS

## 2021-05-25 ENCOUNTER — Other Ambulatory Visit: Payer: Self-pay

## 2021-05-25 ENCOUNTER — Ambulatory Visit: Payer: Medicare Other | Admitting: Urgent Care

## 2021-05-25 ENCOUNTER — Ambulatory Visit
Admission: RE | Admit: 2021-05-25 | Discharge: 2021-05-25 | Disposition: A | Discharge status: Home / Self Care | Payer: Medicare Other | Attending: Urology | Admitting: Urology

## 2021-05-25 ENCOUNTER — Encounter: Payer: Self-pay | Admitting: Urology

## 2021-05-25 ENCOUNTER — Ambulatory Visit: Payer: Medicare Other

## 2021-05-25 ENCOUNTER — Encounter
Admission: RE | Disposition: A | Discharge status: Home / Self Care | Payer: Self-pay | Source: Home / Self Care | Attending: Urology

## 2021-05-25 DIAGNOSIS — K219 Gastro-esophageal reflux disease without esophagitis: Secondary | ICD-10-CM | POA: Diagnosis not present

## 2021-05-25 DIAGNOSIS — F17210 Nicotine dependence, cigarettes, uncomplicated: Secondary | ICD-10-CM | POA: Insufficient documentation

## 2021-05-25 DIAGNOSIS — Z8579 Personal history of other malignant neoplasms of lymphoid, hematopoietic and related tissues: Secondary | ICD-10-CM | POA: Insufficient documentation

## 2021-05-25 DIAGNOSIS — N3281 Overactive bladder: Secondary | ICD-10-CM | POA: Diagnosis not present

## 2021-05-25 DIAGNOSIS — N183 Chronic kidney disease, stage 3 unspecified: Secondary | ICD-10-CM | POA: Diagnosis not present

## 2021-05-25 DIAGNOSIS — Z87440 Personal history of urinary (tract) infections: Secondary | ICD-10-CM | POA: Diagnosis not present

## 2021-05-25 DIAGNOSIS — J449 Chronic obstructive pulmonary disease, unspecified: Secondary | ICD-10-CM | POA: Insufficient documentation

## 2021-05-25 DIAGNOSIS — I5042 Chronic combined systolic (congestive) and diastolic (congestive) heart failure: Secondary | ICD-10-CM | POA: Diagnosis not present

## 2021-05-25 DIAGNOSIS — N200 Calculus of kidney: Secondary | ICD-10-CM | POA: Insufficient documentation

## 2021-05-25 DIAGNOSIS — I130 Hypertensive heart and chronic kidney disease with heart failure and stage 1 through stage 4 chronic kidney disease, or unspecified chronic kidney disease: Secondary | ICD-10-CM | POA: Diagnosis not present

## 2021-05-25 DIAGNOSIS — I2510 Atherosclerotic heart disease of native coronary artery without angina pectoris: Secondary | ICD-10-CM | POA: Diagnosis not present

## 2021-05-25 HISTORY — DX: Chronic obstructive pulmonary disease, unspecified: J44.9

## 2021-05-25 HISTORY — PX: BOTOX INJECTION: SHX5754

## 2021-05-25 HISTORY — PX: CYSTOSCOPY/URETEROSCOPY/HOLMIUM LASER/STENT PLACEMENT: SHX6546

## 2021-05-25 SURGERY — CYSTOSCOPY/URETEROSCOPY/HOLMIUM LASER/STENT PLACEMENT
Anesthesia: General | Laterality: Right

## 2021-05-25 MED ORDER — ONDANSETRON HCL 4 MG/2ML IJ SOLN
INTRAMUSCULAR | Status: DC | PRN
Start: 2021-05-25 — End: 2021-05-25
  Administered 2021-05-25: 4 mg via INTRAVENOUS

## 2021-05-25 MED ORDER — LIDOCAINE HCL (CARDIAC) PF 100 MG/5ML IV SOSY
PREFILLED_SYRINGE | INTRAVENOUS | Status: DC | PRN
  Administered 2021-05-25: 60 mg via INTRAVENOUS

## 2021-05-25 MED ORDER — ONDANSETRON HCL 4 MG/2ML IJ SOLN
INTRAMUSCULAR | Status: AC
  Filled 2021-05-25: qty 2

## 2021-05-25 MED ORDER — ONABOTULINUMTOXINA 100 UNITS IJ SOLR
INTRAMUSCULAR | Status: DC | PRN
  Administered 2021-05-25: 100 [IU] via INTRAMUSCULAR

## 2021-05-25 MED ORDER — OXYBUTYNIN CHLORIDE 5 MG PO TABS: 5.0000 mg | ORAL_TABLET | Freq: Three times a day (TID) | ORAL | 0 refills | Status: DC | PRN

## 2021-05-25 MED ORDER — FAMOTIDINE 20 MG PO TABS
ORAL_TABLET | ORAL | Status: AC
  Administered 2021-05-25: 20 mg via ORAL
  Filled 2021-05-25: qty 1

## 2021-05-25 MED ORDER — OXYCODONE-ACETAMINOPHEN 5-325 MG PO TABS: 1.0000 | ORAL_TABLET | ORAL | 0 refills | Status: DC | PRN

## 2021-05-25 MED ORDER — FENTANYL CITRATE (PF) 100 MCG/2ML IJ SOLN
INTRAMUSCULAR | Status: DC | PRN
Start: 2021-05-25 — End: 2021-05-25
  Administered 2021-05-25: 50 ug via INTRAVENOUS

## 2021-05-25 MED ORDER — DEXAMETHASONE SODIUM PHOSPHATE 10 MG/ML IJ SOLN
INTRAMUSCULAR | Status: AC
  Filled 2021-05-25: qty 1

## 2021-05-25 MED ORDER — SUGAMMADEX SODIUM 200 MG/2ML IV SOLN
INTRAVENOUS | Status: DC | PRN
Start: 2021-05-25 — End: 2021-05-25
  Administered 2021-05-25: 200 mg via INTRAVENOUS

## 2021-05-25 MED ORDER — FENTANYL CITRATE (PF) 100 MCG/2ML IJ SOLN
INTRAMUSCULAR | Status: AC
  Filled 2021-05-25: qty 2

## 2021-05-25 MED ORDER — SODIUM CHLORIDE FLUSH 0.9 % IV SOLN
INTRAVENOUS | Status: AC
  Filled 2021-05-25: qty 10

## 2021-05-25 MED ORDER — PHENYLEPHRINE 40 MCG/ML (10ML) SYRINGE FOR IV PUSH (FOR BLOOD PRESSURE SUPPORT)
PREFILLED_SYRINGE | INTRAVENOUS | Status: DC | PRN
  Administered 2021-05-25: 80 ug via INTRAVENOUS

## 2021-05-25 MED ORDER — FENTANYL CITRATE (PF) 100 MCG/2ML IJ SOLN: 25.0000 ug | INTRAMUSCULAR | Status: DC | PRN

## 2021-05-25 MED ORDER — DEXAMETHASONE SODIUM PHOSPHATE 10 MG/ML IJ SOLN
INTRAMUSCULAR | Status: DC | PRN
  Administered 2021-05-25: 5 mg via INTRAVENOUS

## 2021-05-25 MED ORDER — ACETAMINOPHEN 10 MG/ML IV SOLN
INTRAVENOUS | Status: DC | PRN
  Administered 2021-05-25: 1000 mg via INTRAVENOUS

## 2021-05-25 MED ORDER — ONDANSETRON HCL 4 MG/2ML IJ SOLN
4.0000 mg | Freq: Once | INTRAMUSCULAR | Status: AC
  Administered 2021-05-25: 4 mg via INTRAVENOUS

## 2021-05-25 MED ORDER — CHLORHEXIDINE GLUCONATE 0.12 % MT SOLN
OROMUCOSAL | Status: AC
  Administered 2021-05-25: 15 mL via OROMUCOSAL
  Filled 2021-05-25: qty 15

## 2021-05-25 MED ORDER — SODIUM CHLORIDE 0.9 % IR SOLN
Status: DC | PRN
  Administered 2021-05-25: 1000 mL via INTRAVESICAL

## 2021-05-25 MED ORDER — ROCURONIUM BROMIDE 10 MG/ML (PF) SYRINGE
PREFILLED_SYRINGE | INTRAVENOUS | Status: AC
  Filled 2021-05-25: qty 10

## 2021-05-25 MED ORDER — ACETAMINOPHEN 10 MG/ML IV SOLN
INTRAVENOUS | Status: AC
  Filled 2021-05-25: qty 100

## 2021-05-25 MED ORDER — PROPOFOL 10 MG/ML IV BOLUS
INTRAVENOUS | Status: DC | PRN
  Administered 2021-05-25: 120 mg via INTRAVENOUS

## 2021-05-25 MED ORDER — ROCURONIUM BROMIDE 100 MG/10ML IV SOLN
INTRAVENOUS | Status: DC | PRN
  Administered 2021-05-25: 35 mg via INTRAVENOUS

## 2021-05-25 MED ORDER — TAMSULOSIN HCL 0.4 MG PO CAPS: 0.4000 mg | ORAL_CAPSULE | Freq: Every day | ORAL | 0 refills | Status: DC

## 2021-05-25 MED ORDER — LIDOCAINE HCL (PF) 2 % IJ SOLN
INTRAMUSCULAR | Status: AC
  Filled 2021-05-25: qty 5

## 2021-05-25 MED ORDER — IOHEXOL 180 MG/ML  SOLN
INTRAMUSCULAR | Status: DC | PRN
  Administered 2021-05-25: 10 mL

## 2021-05-25 MED ORDER — SODIUM CHLORIDE (PF) 0.9 % IJ SOLN
INTRAMUSCULAR | Status: DC | PRN
  Administered 2021-05-25: 10 mL

## 2021-05-25 SURGICAL SUPPLY — 39 items
ADH LQ OCL WTPRF AMP STRL LF (MISCELLANEOUS)
ADHESIVE MASTISOL STRL (MISCELLANEOUS) IMPLANT
BAG DRAIN CYSTO-URO LG1000N (MISCELLANEOUS) ×3 IMPLANT
BASKET ZERO TIP 1.9FR (BASKET) IMPLANT
BRUSH SCRUB EZ 1% IODOPHOR (MISCELLANEOUS) ×3 IMPLANT
BSKT STON RTRVL ZERO TP 1.9FR (BASKET)
CATH URET FLEX-TIP 2 LUMEN 10F (CATHETERS) ×1 IMPLANT
CATH URETL OPEN 5X70 (CATHETERS) ×3 IMPLANT
CNTNR SPEC 2.5X3XGRAD LEK (MISCELLANEOUS)
CONT SPEC 4OZ STER OR WHT (MISCELLANEOUS)
CONT SPEC 4OZ STRL OR WHT (MISCELLANEOUS)
CONTAINER SPEC 2.5X3XGRAD LEK (MISCELLANEOUS) IMPLANT
DRAPE UTILITY 15X26 TOWEL STRL (DRAPES) ×3 IMPLANT
DRSG TEGADERM 2-3/8X2-3/4 SM (GAUZE/BANDAGES/DRESSINGS) IMPLANT
GAUZE 4X4 16PLY ~~LOC~~+RFID DBL (SPONGE) ×6 IMPLANT
GLOVE SURG ENC MOIS LTX SZ6.5 (GLOVE) ×3 IMPLANT
GOWN STRL REUS W/ TWL LRG LVL3 (GOWN DISPOSABLE) ×4 IMPLANT
GOWN STRL REUS W/TWL LRG LVL3 (GOWN DISPOSABLE) ×6
GUIDEWIRE GREEN .038 145CM (MISCELLANEOUS) ×1 IMPLANT
GUIDEWIRE STR DUAL SENSOR (WIRE) ×3 IMPLANT
INFUSOR MANOMETER BAG 3000ML (MISCELLANEOUS) ×3 IMPLANT
IV NS IRRIG 3000ML ARTHROMATIC (IV SOLUTION) ×3 IMPLANT
KIT TURNOVER CYSTO (KITS) ×3 IMPLANT
MANIFOLD NEPTUNE II (INSTRUMENTS) ×3 IMPLANT
NDL INJETAK FLEX 70CM BOTOX (NEEDLE) ×2 IMPLANT
NDL SAFETY ECLIPSE 18X1.5 (NEEDLE) ×4 IMPLANT
NEEDLE HYPO 18GX1.5 SHARP (NEEDLE) ×6
NEEDLE INJETAK FLEX 70CM BOTOX (NEEDLE) ×3 IMPLANT
PACK CYSTO AR (MISCELLANEOUS) ×3 IMPLANT
SET CYSTO W/LG BORE CLAMP LF (SET/KITS/TRAYS/PACK) ×3 IMPLANT
SHEATH URETERAL 12FRX35CM (MISCELLANEOUS) ×1 IMPLANT
STENT URET 6FRX22 CONTOUR (STENTS) ×1 IMPLANT
STENT URET 6FRX24 CONTOUR (STENTS) IMPLANT
STENT URET 6FRX26 CONTOUR (STENTS) IMPLANT
SURGILUBE 2OZ TUBE FLIPTOP (MISCELLANEOUS) ×3 IMPLANT
SYR 10ML LL (SYRINGE) ×6 IMPLANT
TRACTIP FLEXIVA PULSE ID 200 (Laser) ×4 IMPLANT
WATER STERILE IRR 1000ML POUR (IV SOLUTION) ×3 IMPLANT
WATER STERILE IRR 500ML POUR (IV SOLUTION) ×3 IMPLANT

## 2021-05-25 NOTE — Op Note (Signed)
Date of procedure: 05/25/21  Preoperative diagnosis:  OAB (overactive bladder) Right renal pelvic stone, 9 mm  Postoperative diagnosis:  Same as above  Procedure: Cystoscopy with intravesical Botox injection Right ureteroscopy with laser lithotripsy Right retrograde pyelogram Right ureteral stent placement Interpretation of fluoroscopy less than 30 minutes  Surgeon: Hollice Espy, MD  Anesthesia: General  Complications: None  Intraoperative findings: Fairly unremarkable bladder.  Botox injected without difficulty.  Uncomplicated right ureteroscopy, stent left on tether.  EBL: Minimal  Specimens: None  Drains: 6 x 22 French double-J ureteral stent on right with tether  Indication: Tiffany Leon is a 71 y.o. patient with recurrent UTIs, poorly controlled overactive bladder electing Botox and incidental enlarging right renal pelvic stone now up to 5 mm.  After reviewing the management options for treatment, she elected to proceed with the above surgical procedure(s). We have discussed the potential benefits and risks of the procedure, side effects of the proposed treatment, the likelihood of the patient achieving the goals of the procedure, and any potential problems that might occur during the procedure or recuperation. Informed consent has been obtained.  Description of procedure:  The patient was taken to the operating room and general anesthesia was induced.  The patient was placed in the dorsal lithotomy position, prepped and draped in the usual sterile fashion, and preoperative antibiotics were administered. A preoperative time-out was performed.   A 21 French scope was advanced per urethra into the bladder.  The bladder was carefully inspected which was fairly unremarkable.  An injectable semiflexible Botox needle was brought in and a total of 100 units of Botox was injected intramuscularly into the bladder.  This was achieved by injecting a half a cc to superior to the trigone  and 2 rows of 10.  There is minimal bleeding.  Next, scout imaging, the large 9 mm stone was easily seen.  The right UO was cannulated using a 5 [place] open-ended ureteral catheter and a wire was placed up to the level of the kidney which went easily alongside the stone.  A dual-lumen access sheath was then used to inject contrast confirming is infected represent a renal pelvic stone.  There was mild renal pelvic fullness but no overt hydronephrosis.  There was a filling defect at the level of the stone.  A Super Stiff wire was then placed up to the level of the kidney.  The sensor wire snapped in place as a safety wire.  A 12/14 French Cook ureteral access sheath advanced very easily under fluoroscopic guidance the proximal ureter.  The inner cannula and wire were removed and a dual-lumen flexible digital ureteroscope was then brought in and advanced into the renal pelvis where the stone was encountered.  242 micro laser fiber was then brought in and using settings of 0.3 J and 80 Hz, the stone was dusted.  It dusted actually quite well.  I did try some fragments into the each of the calyces and dusted those until no further stone fragments remained greater than the tip of the size of the laser fiber.  I injected 1 less contrast material through the scope to create a retrograde pyelogram to ensure that each every calyx was directly visualized.  Once the collecting system was adequately cleared, scope was backed down length of the ureter inspecting the ureter along the way.  There was no mucosal injuries appreciated.  Finally, a 6 x 22 French double-J ureteral stent was advanced all the way up to the level of the  kidney.  Upon wire removal, full coil was noted both within the renal pelvis as well as within the bladder.  The stent string was left affixed to the distal coil of the stent which was affixed to the patient's left inner thigh using Mastisol and Tegaderm.  She was then cleaned and dried, repositioned  supine position, reversed from anesthesia, taken to the PACU in stable condition.  Plan: We will have her remove her own stent on Thursday morning.  We will have her follow-up with me in about a month with a renal ultrasound and a bladder scan.  Hollice Espy, M.D.

## 2021-05-25 NOTE — Anesthesia Postprocedure Evaluation (Signed)
Anesthesia Post Note  Patient: Tiffany Leon  Procedure(s) Performed: CYSTOSCOPY/URETEROSCOPY/HOLMIUM LASER/STENT PLACEMENT (Right) BOTOX INJECTION  Patient location during evaluation: PACU Anesthesia Type: General Level of consciousness: awake and alert Pain management: pain level controlled Vital Signs Assessment: post-procedure vital signs reviewed and stable Respiratory status: spontaneous breathing, nonlabored ventilation, respiratory function stable and patient connected to nasal cannula oxygen Cardiovascular status: blood pressure returned to baseline and stable Postop Assessment: no apparent nausea or vomiting Anesthetic complications: no   No notable events documented.   Last Vitals:  Vitals:   05/25/21 1524 05/25/21 1530  BP:  135/75  Pulse: 66   Resp: 20   Temp: (!) 36 C   SpO2: 96%     Last Pain:  Vitals:   05/25/21 1524  TempSrc: Temporal  PainSc: 0-No pain                 Precious Haws Jeston Junkins

## 2021-05-25 NOTE — Interval H&P Note (Signed)
History and Physical Interval Note:  05/25/2021 1:23 PM  Tiffany Leon  has presented today for surgery, with the diagnosis of RIGHT URETERAL STONE, OVERACTIVE BLADDER.  The various methods of treatment have been discussed with the patient and family. After consideration of risks, benefits and other options for treatment, the patient has consented to  Procedure(s): CYSTOSCOPY/URETEROSCOPY/HOLMIUM LASER/STENT PLACEMENT (Right) BOTOX INJECTION (N/A) as a surgical intervention.  The patient's history has been reviewed, patient examined, no change in status, stable for surgery.  I have reviewed the patient's chart and labs.  Questions were answered to the patient's satisfaction.    RRR CTAB  Hollice Espy

## 2021-05-25 NOTE — Progress Notes (Signed)
Pt c/o nausea. Dr. Andree Elk notified. Acknowledged. Orders received. See [facility]

## 2021-05-25 NOTE — Transfer of Care (Signed)
Immediate Anesthesia Transfer of Care Note  Patient: Tiffany Leon  Procedure(s) Performed: CYSTOSCOPY/URETEROSCOPY/HOLMIUM LASER/STENT PLACEMENT (Right) BOTOX INJECTION  Patient Location: PACU  Anesthesia Type:General  Level of Consciousness: awake  Airway & Oxygen Therapy: Patient Spontanous Breathing and Patient connected to face mask oxygen  Post-op Assessment: Report given to RN and Post -op Vital signs reviewed and stable  Post vital signs: Reviewed and stable  Last Vitals:  Vitals Value Taken Time  BP 116/64 05/25/21 1445  Temp 36.2 C 05/25/21 1443  Pulse 62 05/25/21 1446  Resp 23 05/25/21 1446  SpO2 100 % 05/25/21 1446  Vitals shown include unvalidated device data.  Last Pain:  Vitals:   05/25/21 1443  TempSrc:   PainSc: Asleep         Complications: No notable events documented.

## 2021-05-25 NOTE — Anesthesia Procedure Notes (Signed)
Procedure Name: Intubation Date/Time: 05/25/2021 1:54 PM Performed by: Demetrius Charity, CRNA Pre-anesthesia Checklist: Patient identified, Patient being monitored, Timeout performed, Emergency Drugs available and Suction available Patient Re-evaluated:Patient Re-evaluated prior to induction Oxygen Delivery Method: Circle system utilized Preoxygenation: Pre-oxygenation with 100% oxygen Induction Type: IV induction Ventilation: Mask ventilation without difficulty Laryngoscope Size: 3 and McGraph Grade View: Grade I Tube type: Oral Tube size: 7.0 mm Number of attempts: 1 Airway Equipment and Method: Stylet and Video-laryngoscopy Placement Confirmation: ETT inserted through vocal cords under direct vision, positive ETCO2 and breath sounds checked- equal and bilateral Secured at: 21 cm Tube secured with: Tape Dental Injury: Teeth and Oropharynx as per pre-operative assessment

## 2021-05-25 NOTE — Anesthesia Preprocedure Evaluation (Addendum)
Anesthesia Evaluation  Patient identified by MRN, date of birth, ID band Patient awake    Reviewed: Allergy & Precautions, NPO status , Patient's Chart, lab work & pertinent test results  History of Anesthesia Complications Negative for: history of anesthetic complications  Airway Mallampati: III  TM Distance: <3 FB Neck ROM: full    Dental  (+) Chipped, Poor Dentition, Missing   Pulmonary shortness of breath and with exertion, COPD, Current Smoker and Patient abstained from smoking.,    Pulmonary exam normal        Cardiovascular hypertension, (-) angina+ CAD, +CHF and + DOE  Normal cardiovascular exam     Neuro/Psych negative neurological ROS     GI/Hepatic Neg liver ROS, hiatal hernia, GERD  Controlled,  Endo/Other  Hypothyroidism   Renal/GU Renal disease     Musculoskeletal   Abdominal   Peds  Hematology negative hematology ROS (+)   Anesthesia Other Findings Past Medical History: No date: Acute kidney injury ([place])     Comment:  a. 09/2018 No date: Anxiety No date: Aortic atherosclerosis ([place])     Comment:  a. 09/2018 noted on Chest CT. No date: Chronic combined systolic (congestive) and diastolic  (congestive) heart failure (HCC)     Comment:  a.) 09/2018 Echo: EF 45-50%, diff HK. Triv to small               circumfirential pericardial eff. b.) TTE 02/14/2020: EF               60-65%; no RWMAs; G1DD; GLS -23.3% No date: Chronic DOE (dyspnea on exertion) No date: Claudication [facility])     Comment:  a. Bilat hip claudication since ~ 2019. No date: COPD (chronic obstructive pulmonary disease) ([place]) 09/20/2018: Coronary artery calcification seen on CT scan No date: Depression No date: Dyspnea No date: Emphysema lung (HCC) No date: Exertional angina ([place])     Comment:  a. Ex angina since ~ 2019. No date: GERD (gastroesophageal reflux disease) 11/03/2018: Goals of care, counseling/discussion No date: Hiatal  hernia 01/16/2021: History of 2019 novel coronavirus disease (COVID-19) No date: History of bronchitis No date: History of kidney stones No date: Hypothyroidism No date: Pleural effusion     Comment:  a. 09/2018 s/p thoracentesis-->956ml. No date: Resting tremor No date: Tobacco abuse 11/03/2018: Waldenstrom's macroglobulinemia [facility])  Past Surgical History: 12/28/2017: BREAST LUMPECTOMY WITH RADIOACTIVE SEED LOCALIZATION; Left     Comment:  Procedure: BREAST LUMPECTOMY WITH RADIOACTIVE SEED               LOCALIZATION;  Surgeon: Jovita Kussmaul, MD;  Location: [place];  Service: General;  Laterality: Left; No date: BREAST SURGERY No date: DG THUMB RIGHT HAND ([facility] HX) 11/09/2018: IR IMAGING GUIDED PORT INSERTION 09/20/2018: IR THORACENTESIS ASP PLEURAL SPACE W/IMG GUIDE No date: KIDNEY STONE SURGERY  BMI    Body Mass Index: 21.63 kg/m      Reproductive/Obstetrics negative OB ROS                            Anesthesia Physical Anesthesia Plan  ASA: 3  Anesthesia Plan: General ETT   Post-op Pain Management:    Induction: Intravenous  PONV Risk Score and Plan: Ondansetron, Dexamethasone, Midazolam and Treatment may vary due to age or medical condition  Airway Management Planned: Oral ETT  Additional Equipment:   Intra-op Plan:  Post-operative Plan: Extubation in OR  Informed Consent: I have reviewed the patients History and Physical, chart, labs and discussed the procedure including the risks, benefits and alternatives for the proposed anesthesia with the patient or authorized representative who has indicated his/her understanding and acceptance.     Dental Advisory Given  Plan Discussed with: Anesthesiologist, CRNA and Surgeon  Anesthesia Plan Comments: (Patient consented for risks of anesthesia including but not limited to:  - adverse reactions to medications - damage to eyes, teeth, lips or other oral mucosa - nerve damage  due to positioning  - sore throat or hoarseness - Damage to heart, brain, nerves, lungs, other parts of body or loss of life  Patient voiced understanding.)        Anesthesia Quick Evaluation

## 2021-05-25 NOTE — Discharge Instructions (Addendum)
You have a ureteral stent in place.  This is a tube that extends from your kidney to your bladder.  This may cause urinary bleeding, burning with urination, and urinary frequency.  Please call our office or present to the ED if you develop fevers >101 or pain which is not able to be controlled with oral pain medications.  You may be given either Flomax and/ or ditropan to help with bladder spasms and stent pain in addition to pain medications.    Your stent is on a string.  On Thursday morning, you may untape this from your left inner thigh and pulled gently until the entire stent is removed.  If you have any difficulties or concerns, please contact our office.  If the stent is prematurely dislodged, please call our office during normal office hours.  [place] [address] [place], [place] [address] [phone]  AMBULATORY SURGERY  DISCHARGE INSTRUCTIONS   The drugs that you were given will stay in your system until tomorrow so for the next 24 hours you should not:  Drive an automobile Make any legal decisions Drink any alcoholic beverage   You may resume regular meals tomorrow.  Today it is better to start with liquids and gradually work up to solid foods.  You may eat anything you prefer, but it is better to start with liquids, then soup and crackers, and gradually work up to solid foods.   Please notify your doctor immediately if you have any unusual bleeding, trouble breathing, redness and pain at the surgery site, drainage, fever, or pain not relieved by medication.    Additional Instructions:        Please contact your physician with any problems or Same Day Surgery at [phone], Monday through Friday 6 am to 4 pm, or [place] at [facility] number at [phone].

## 2021-05-26 ENCOUNTER — Encounter: Payer: Self-pay | Admitting: Urology

## 2021-05-28 ENCOUNTER — Other Ambulatory Visit: Payer: Self-pay

## 2021-05-28 DIAGNOSIS — N200 Calculus of kidney: Secondary | ICD-10-CM

## 2021-06-05 ENCOUNTER — Other Ambulatory Visit: Payer: Self-pay

## 2021-06-05 ENCOUNTER — Inpatient Hospital Stay: Payer: Medicare Other | Attending: Hematology & Oncology

## 2021-06-05 ENCOUNTER — Other Ambulatory Visit: Payer: Medicare Other

## 2021-06-05 ENCOUNTER — Encounter: Payer: Self-pay | Admitting: Hematology & Oncology

## 2021-06-05 ENCOUNTER — Inpatient Hospital Stay: Payer: Medicare Other | Admitting: Hematology & Oncology

## 2021-06-05 ENCOUNTER — Ambulatory Visit: Payer: Medicare Other | Admitting: Hematology & Oncology

## 2021-06-05 ENCOUNTER — Inpatient Hospital Stay: Payer: Medicare Other

## 2021-06-05 VITALS — BP 126/63 | HR 70 | Temp 98.2°F | Resp 18 | Wt 136.0 lb

## 2021-06-05 DIAGNOSIS — N189 Chronic kidney disease, unspecified: Secondary | ICD-10-CM | POA: Diagnosis not present

## 2021-06-05 DIAGNOSIS — D649 Anemia, unspecified: Secondary | ICD-10-CM | POA: Diagnosis not present

## 2021-06-05 DIAGNOSIS — C8800 Waldenstrom macroglobulinemia not having achieved remission: Secondary | ICD-10-CM

## 2021-06-05 LAB — CBC WITH DIFFERENTIAL (CANCER CENTER ONLY)
Abs Immature Granulocytes: 0.24 10*3/uL — ABNORMAL HIGH (ref 0.00–0.07)
Basophils Absolute: 0 10*3/uL (ref 0.0–0.1)
Basophils Relative: 0 %
Eosinophils Absolute: 0.1 10*3/uL (ref 0.0–0.5)
Eosinophils Relative: 1 %
HCT: 33.2 % — ABNORMAL LOW (ref 36.0–46.0)
Hemoglobin: 11.2 g/dL — ABNORMAL LOW (ref 12.0–15.0)
Immature Granulocytes: 6 %
Lymphocytes Relative: 22 %
Lymphs Abs: 0.9 10*3/uL (ref 0.7–4.0)
MCH: 32.8 pg (ref 26.0–34.0)
MCHC: 33.7 g/dL (ref 30.0–36.0)
MCV: 97.4 fL (ref 80.0–100.0)
Monocytes Absolute: 0.3 10*3/uL (ref 0.1–1.0)
Monocytes Relative: 8 %
Neutro Abs: 2.5 10*3/uL (ref 1.7–7.7)
Neutrophils Relative %: 63 %
Platelet Count: 238 10*3/uL (ref 150–400)
RBC: 3.41 MIL/uL — ABNORMAL LOW (ref 3.87–5.11)
RDW: 12.6 % (ref 11.5–15.5)
WBC Count: 4 10*3/uL (ref 4.0–10.5)
nRBC: 0 % (ref 0.0–0.2)

## 2021-06-05 LAB — CMP (CANCER CENTER ONLY)
ALT: 8 U/L (ref 0–44)
AST: 12 U/L — ABNORMAL LOW (ref 15–41)
Albumin: 3.5 g/dL (ref 3.5–5.0)
Alkaline Phosphatase: 77 U/L (ref 38–126)
Anion gap: 6 (ref 5–15)
BUN: 15 mg/dL (ref 8–23)
CO2: 26 mmol/L (ref 22–32)
Calcium: 8.7 mg/dL — ABNORMAL LOW (ref 8.9–10.3)
Chloride: 108 mmol/L (ref 98–111)
Creatinine: 0.98 mg/dL (ref 0.44–1.00)
GFR, Estimated: >60 mL/min (ref >60)
Glucose, Bld: 83 mg/dL (ref 70–99)
Potassium: 3.8 mmol/L (ref 3.5–5.1)
Sodium: 140 mmol/L (ref 135–145)
Total Bilirubin: 0.3 mg/dL (ref 0.3–1.2)
Total Protein: 5.7 g/dL — ABNORMAL LOW (ref 6.5–8.1)

## 2021-06-05 LAB — LACTATE DEHYDROGENASE: LDH: 134 U/L (ref 98–192)

## 2021-06-05 MED ORDER — LIDOCAINE-PRILOCAINE 2.5-2.5 % EX CREA: 1.0000 "application " | TOPICAL_CREAM | CUTANEOUS | 3 refills | Status: AC | PRN

## 2021-06-05 NOTE — Patient Instructions (Signed)

## 2021-06-05 NOTE — Progress Notes (Signed)
Hematology and Oncology Follow Up Visit  Tiffany Leon [id number] 28-Dec-1950 71 y.o. 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  Saw her 6 months ago.  Since then, she been doing fairly well.  She been having problems with her bladder.  Shockingly, she had a Botox put into her bladder.  I did not know that this was done.  However, it did seem to help the bladder spasms.  Otherwise, she seems to be doing fairly well.  She has had no problems with bleeding.  There is been no problems with cough or shortness of breath.  She has had no rashes.  She had COVID I think back in September.  She had a hard time getting over this.  When we last saw her, her monoclonal spike was not found in the blood.  The IgM level was 76 mg/dL.  Her appetite has been good.  She has had no nausea or vomiting.  There is been no change in bowel or bladder habits.  Again the urine has been a little bit better with respect to bladder spasms.  I think she and her husband will be going down to [place] sometime this spring.  Overall, I would say performance status is ECOG 1.    Medications:  Allergies as of 06/05/2021       Reactions   Codeine Nausea And Vomiting        Medication List        Accurate as of June 05, 2021  9:14 AM. If you have any questions, ask your nurse or doctor.          Acetaminophen Extra Strength 500 MG capsule Generic drug: Acetaminophen Take 1,000 mg by mouth every 6 (six) hours as needed for pain.   carvedilol 12.5 MG tablet Commonly known as: COREG TAKE 1 TABLET (12.5 MG TOTAL) BY MOUTH 2 (TWO) TIMES DAILY WITH A  MEAL. What changed: when to take this   clonazePAM 0.5 MG tablet Commonly known as: KLONOPIN Take 1 tablet (0.5 mg total) by mouth 2 (two) times daily.   DULoxetine 60 MG capsule Commonly known as: CYMBALTA Take 1 capsule (60 mg total) by mouth daily.   lidocaine-prilocaine cream Commonly known as: EMLA Apply 1 application topically as needed. Apply to [facility] 1 hour before access Started by: Tildon Husky, RN   oxybutynin 5 MG tablet Commonly known as: DITROPAN Take 1 tablet (5 mg total) by mouth every 8 (eight) hours as needed for bladder spasms.   oxyCODONE-acetaminophen 5-325 MG tablet Commonly known as: Percocet Take 1-2 tablets by mouth every 4 (four) hours as needed for moderate pain or severe pain.   tamsulosin 0.4 MG Caps capsule Commonly known as: Flomax Take 1 capsule (0.4 mg total) by mouth daily.   traZODone 100 MG tablet Commonly known as: DESYREL Take 1-1.5 tablets (100-150 mg total) by mouth at bedtime as needed for sleep. What changed:  how much to take when to take this   triamcinolone ointment 0.1 % Commonly known as: KENALOG Apply 1 application topically daily as needed (eczema).        Allergies:  Allergies  Allergen Reactions   Codeine Nausea And  Vomiting    Past Medical History, Surgical history, Social history, and Family History were reviewed and updated.  Review of Systems: Review of Systems  Constitutional: Negative.   HENT: Negative.    Eyes: Negative.   Respiratory: Negative.    Cardiovascular: Negative.   Gastrointestinal: Negative.   Genitourinary: Negative.   Musculoskeletal:  Positive for joint pain.  Skin: Negative.   Neurological: Negative.   Endo/Heme/Allergies: Negative.   Psychiatric/Behavioral: Negative.      Physical Exam:  weight is 136 lb (61.7 kg). Her oral temperature is 98.2 F (36.8 C). Her blood pressure is 126/63 and her pulse is 70. Her respiration is 18 and oxygen saturation is 98%.   Wt Readings  from Last 3 Encounters:  06/05/21 136 lb (61.7 kg)  05/25/21 132 lb (59.9 kg)  05/20/21 132 lb (59.9 kg)    Physical Exam Vitals reviewed.  HENT:     Head: Normocephalic and atraumatic.  Eyes:     Pupils: Pupils are equal, round, and reactive to light.  Cardiovascular:     Rate and Rhythm: Normal rate and regular rhythm.     Heart sounds: Normal heart sounds.  Pulmonary:     Effort: Pulmonary effort is normal.     Breath sounds: Normal breath sounds.  Abdominal:     General: Bowel sounds are normal.     Palpations: Abdomen is soft.  Musculoskeletal:        General: No tenderness or deformity. Normal range of motion.     Cervical back: Normal range of motion.  Lymphadenopathy:     Cervical: No cervical adenopathy.  Skin:    General: Skin is warm and dry.     Findings: No erythema or rash.  Neurological:     Mental Status: She is alert and oriented to person, place, and time.  Psychiatric:        Behavior: Behavior normal.        Thought Content: Thought content normal.        Judgment: Judgment normal.     Lab Results  Component Value Date   WBC 4.0 06/05/2021   HGB 11.2 (L) 06/05/2021   HCT 33.2 (L) 06/05/2021   MCV 97.4 06/05/2021   PLT 238 06/05/2021   Lab Results  Component Value Date   FERRITIN 261 12/25/2019   IRON 122 12/25/2019   TIBC 270 12/25/2019   UIBC 148 12/25/2019   IRONPCTSAT 45 12/25/2019   Lab Results  Component Value Date   RETICCTPCT 2.2 12/25/2019   RBC 3.41 (L) 06/05/2021   Lab Results  Component Value Date   KPAFRELGTCHN 15.3 02/03/2021   LAMBDASER 12.4 02/03/2021   KAPLAMBRATIO 1.23 02/03/2021   Lab Results  Component Value Date   IGGSERUM 244 (L) 02/03/2021   IGA 64 02/03/2021   IGMSERUM 76 02/03/2021   Lab Results  Component Value Date   TOTALPROTELP 5.7 (L) 02/03/2021   ALBUMINELP 3.3 02/03/2021   A1GS 0.2 02/03/2021   A2GS 1.1 (H) 02/03/2021   BETS 0.8 02/03/2021   GAMS 0.3 (L) 02/03/2021   MSPIKE Not Observed  02/03/2021   SPEI Comment 12/25/2019     Chemistry      Component Value Date/Time   NA 137 05/15/2021 1047   K 3.8 05/15/2021 1047   CL 105 05/15/2021 1047   CO2 25 05/15/2021 1047   BUN 16 05/15/2021 1047   CREATININE 0.90 05/15/2021 1047   CREATININE 0.76 02/03/2021 1146      Component Value  Date/Time   CALCIUM 9.2 05/15/2021 1047   ALKPHOS 85 02/03/2021 1146   AST 11 (L) 02/03/2021 1146   ALT 8 02/03/2021 1146   BILITOT 0.3 02/03/2021 1146       Impression and Plan: Ms. Mehl is a very pleasant 71 yo caucasian female with Waldenstrm's macroglobulinemia.  She has responded very well to Rituxan.  We initially had her on Rituxan with Cytoxan but she could not  tolerate the Cytoxan with her blood counts going down.  She did well with the maintenance Rituxan.  We now we will just follow her up.  Her hemoglobin is down a little bit.  We will just have to watch this.  Her renal function looks fine.  She really is not symptomatic with this.  I will plan to get her back in 6 more months.  Again, I am surprised to hear that the give Botox into the bladder.      [name], MD 2/17/20239:14 AM

## 2021-06-06 LAB — IGG, IGA, IGM
IgA: 60 mg/dL — ABNORMAL LOW (ref 64–422)
IgG (Immunoglobin G), Serum: 243 mg/dL — ABNORMAL LOW (ref 586–1602)
IgM (Immunoglobulin M), Srm: 72 mg/dL (ref 26–217)

## 2021-06-08 LAB — KAPPA/LAMBDA LIGHT CHAINS
Kappa free light chain: 15.3 mg/L (ref 3.3–19.4)
Kappa, lambda light chain ratio: 1.13 (ref 0.26–1.65)
Lambda free light chains: 13.5 mg/L (ref 5.7–26.3)

## 2021-06-08 LAB — PROTEIN ELECTROPHORESIS, SERUM, WITH REFLEX
A/G Ratio: 1.2 (ref 0.7–1.7)
Albumin ELP: 2.9 g/dL (ref 2.9–4.4)
Alpha-1-Globulin: 0.3 g/dL (ref 0.0–0.4)
Alpha-2-Globulin: 1 g/dL (ref 0.4–1.0)
Beta Globulin: 0.9 g/dL (ref 0.7–1.3)
Gamma Globulin: 0.3 g/dL — ABNORMAL LOW (ref 0.4–1.8)
Globulin, Total: 2.5 g/dL (ref 2.2–3.9)
Total Protein ELP: 5.4 g/dL — ABNORMAL LOW (ref 6.0–8.5)

## 2021-06-16 ENCOUNTER — Other Ambulatory Visit: Payer: Self-pay | Admitting: Urology

## 2021-06-19 ENCOUNTER — Ambulatory Visit (HOSPITAL_COMMUNITY)
Admission: RE | Admit: 2021-06-19 | Discharge: 2021-06-19 | Disposition: A | Discharge status: Home / Self Care | Payer: Medicare Other | Source: Ambulatory Visit | Attending: Urology | Admitting: Urology

## 2021-06-19 ENCOUNTER — Other Ambulatory Visit: Payer: Self-pay

## 2021-06-19 DIAGNOSIS — N200 Calculus of kidney: Secondary | ICD-10-CM | POA: Diagnosis not present

## 2021-06-21 ENCOUNTER — Encounter (HOSPITAL_BASED_OUTPATIENT_CLINIC_OR_DEPARTMENT_OTHER): Payer: Medicare Other | Admitting: Cardiovascular Disease

## 2021-06-24 NOTE — Progress Notes (Signed)
06/25/21 10:20 AM   Tiffany Leon Aug 25, 1950 [id number]  Referring provider:  Pixie Casino, MD [address] [place],  [place] [address] Chief Complaint  Patient presents with   Nephrolithiasis   HPI: Tiffany Leon is a 71 y.o.female with a personal history of OAB, right renal pelvic stone, and UTIs, who presents today for post-op with PVR and RUS prior.   She has a past medical history of Waldenstrom macroglobulinemia, COPD, stage III chronic kidney disease, kidney stones, and congestive heart failure.  She is noted to have had previous procedures for a 6 mm stone in the right renal pelvis without hydronephrosis visualized on CT scan in 2020.   KUB on 05/06/2021 revealed right nephrolithiasis measuring up to approximately 1.3 cm.  She is s/p cytoscopy with intravesical Botox injection and right ureteroscopy on 05/25/2021. Intraoperative findings: Fairly unremarkable bladder.  Botox injected without difficulty.  Uncomplicated right ureteroscopy, stent left on tether.She removed her own stent.   RUS on 06/19/2021 visualized bilateral renal calculi measuring up to 1.5 cm.   She reports that she had 3 good days that were back to back and a few bad days she is concerned that she is not having adequate improvement. She reports her symptoms have been a little better overall. She has been still taking oxybutynin and Flomax even after her stent came out and questions whether she needs to be taking orals.Marland Kitchen   PMH: Past Medical History:  Diagnosis Date   Acute kidney injury ([place])    a. 09/2018   Anxiety    Aortic atherosclerosis ([place])    a. 09/2018 noted on Chest CT.   Chronic combined systolic (congestive) and diastolic (congestive) heart failure ([place])    a.) 09/2018 Echo: EF 45-50%, diff HK. Triv to small circumfirential pericardial eff. b.) TTE 02/14/2020: EF 60-65%; no RWMAs; G1DD; GLS -23.3%   Chronic DOE (dyspnea on exertion)    Claudication (HCC)    a. Bilat hip  claudication since ~ 2019.   COPD (chronic obstructive pulmonary disease) (HCC)    Coronary artery calcification seen on CT scan 09/20/2018   Depression    Dyspnea    Emphysema lung (HCC)    Exertional angina ([place])    a. Ex angina since ~ 2019.   GERD (gastroesophageal reflux disease)    Goals of care, counseling/discussion 11/03/2018   Hiatal hernia    History of 2019 novel coronavirus disease (COVID-19) 01/16/2021   History of bronchitis    History of kidney stones    Hypothyroidism    Pleural effusion    a. 09/2018 s/p thoracentesis-->970m.   Resting tremor    Tobacco abuse    [place] 11/03/2018    Surgical History: Past Surgical History:  Procedure Laterality Date   BOTOX INJECTION N/A 05/25/2021   Procedure: BOTOX INJECTION;  Surgeon: BHollice Espy MD;  Location: [facility] ORS;  Service: Urology;  Laterality: N/A;   BREAST LUMPECTOMY WITH RADIOACTIVE SEED LOCALIZATION Left 12/28/2017   Procedure: BREAST LUMPECTOMY WITH RADIOACTIVE SEED LOCALIZATION;  Surgeon: TJovita Kussmaul MD;  [facility]  Service: General;  Laterality: Left;   BREAST SURGERY     CYSTOSCOPY/URETEROSCOPY/HOLMIUM LASER/STENT PLACEMENT Right 05/25/2021   Procedure: CYSTOSCOPY/URETEROSCOPY/HOLMIUM LASER/STENT PLACEMENT;  Surgeon: BHollice Espy MD;  Location: [facility] ORS;  Service: Urology;  Laterality: Right;   DG THUMB RIGHT HAND ([facility] HX)     IR IMAGING GUIDED PORT INSERTION  11/09/2018   IR THORACENTESIS ASP PLEURAL SPACE W/IMG GUIDE  09/20/2018  KIDNEY STONE SURGERY      Home Medications:  Allergies as of 06/25/2021       Reactions   Codeine Nausea And Vomiting        Medication List        Accurate as of June 25, 2021 10:20 AM. If you have any questions, ask your nurse or doctor.          Acetaminophen Extra Strength 500 MG capsule Generic drug: Acetaminophen Take 1,000 mg by mouth every 6 (six) hours as needed for pain.   carvedilol 12.5 MG  tablet Commonly known as: COREG TAKE 1 TABLET (12.5 MG TOTAL) BY MOUTH 2 (TWO) TIMES DAILY WITH A MEAL. What changed: when to take this   clonazePAM 0.5 MG tablet Commonly known as: KLONOPIN Take 1 tablet (0.5 mg total) by mouth 2 (two) times daily.   DULoxetine 60 MG capsule Commonly known as: CYMBALTA Take 1 capsule (60 mg total) by mouth daily.   lidocaine-prilocaine cream Commonly known as: EMLA Apply 1 application topically as needed. Apply to [facility] 1 hour before access   oxybutynin 5 MG tablet Commonly known as: DITROPAN Take 1 tablet (5 mg total) by mouth every 8 (eight) hours as needed for bladder spasms.   oxyCODONE-acetaminophen 5-325 MG tablet Commonly known as: Percocet Take 1-2 tablets by mouth every 4 (four) hours as needed for moderate pain or severe pain.   tamsulosin 0.4 MG Caps capsule Commonly known as: FLOMAX TAKE 1 CAPSULE BY MOUTH EVERY DAY   traZODone 100 MG tablet Commonly known as: DESYREL Take 1-1.5 tablets (100-150 mg total) by mouth at bedtime as needed for sleep. What changed:  how much to take when to take this   triamcinolone ointment 0.1 % Commonly known as: KENALOG Apply 1 application topically daily as needed (eczema).        Allergies:  Allergies  Allergen Reactions   Codeine Nausea And Vomiting    Family History: Family History  Problem Relation Age of Onset   Stroke Mother        Mini-strokes. Died @ 33.   Dementia Mother    Lung cancer Father        Died in his 34's   Addison's disease Sister    Hypertension Brother    CAD Brother    Hypertension Brother     Social History:  reports that she has been smoking cigarettes. She has a 20.00 pack-year smoking history. She has never used smokeless tobacco. She reports that she does not currently use alcohol. She reports that she does not currently use drugs.   Physical Exam: BP 130/70    Pulse 80    Ht '5\' 5"'$  (1.651 m)    Wt 135 lb (61.2 kg)    BMI 22.47 kg/m    Constitutional:  Alert and oriented, No acute distress. HEENT: [place] AT, moist mucus membranes.  Trachea midline, no masses. Cardiovascular: No clubbing, cyanosis, or edema. Respiratory: Normal respiratory effort, no increased work of breathing. Skin: No rashes, bruises or suspicious lesions. Neurologic: Grossly intact, no focal deficits, moving all 4 extremities. Psychiatric: Normal mood and affect.  Laboratory Data: Lab Results  Component Value Date   CREATININE 0.98 06/05/2021   Lab Results  Component Value Date   HGBA1C 4.6 (L) 09/28/2018    Pertinent Imaging: CLINICAL DATA:  Right ureteral stone.   EXAM: RENAL / URINARY TRACT ULTRASOUND COMPLETE   COMPARISON:  CT abdomen and pelvis 10/18/2018   FINDINGS: Right Kidney:  Renal measurements: 8.7 x 4.9 x 6.0 cm = volume: 134 mL. Echogenicity within normal limits. No mass or hydronephrosis visualized. Multiple shadowing calculi are identified measuring up to 1.1 cm.   Left Kidney:   Renal measurements: 8.9 x 5.7 x 5.2 cm = volume: 139 mL. Echogenicity within normal limits. No mass or hydronephrosis visualized. Multiple shadowing calculi are identified measuring up to 1.5 cm.   Bladder:   Appears normal for degree of bladder distention.   Other:   None.   IMPRESSION: 1. Bilateral renal calculi measuring up to 1.5 cm. 2. No hydronephrosis.     Electronically Signed   By: Ronney Asters M.D.   On: 06/20/2021 19:42      I have personally reviewed the images and agree with radiologist interpretation.   Based on intraoperative findings, do not suspect bilateral renal calculi, rather possibly a stone dust or debris.  Results for orders placed or performed in visit on 06/25/21  BLADDER SCAN AMB NON-IMAGING  Result Value Ref Range   Scan Result 59 ml     Assessment & Plan:   Right renal pelvic stone  - S/p ureteroscopy.   - Will have her get a KUB today to look at overall stone burden suspect this is  likely dust from her procedure  - She is emptying adequately today  - She is clear to stop taking Flomax/Ditropan as these were prescribed to help her with her urinary symptoms -We discussed stone prevention techniques primarily including increasing water intake as well as citric acid  2. OAB  - S/p Botox injection; she will call us if her urinary sympotms have not improved she would like to go back on Gemtesa in a few weeks and addition to her Botox. May consider increase of Botox in terms of frequency.    Return in 3 months for symptoms recheck   I,Kailey Littlejohn,acting as a scribe for Hollice Espy, MD.,have documented all relevant documentation on the behalf of Hollice Espy, MD,as directed by  Hollice Espy, MD while in the presence of Hollice Espy, MD.  I have reviewed the above documentation for accuracy and completeness, and I agree with the above.   Hollice Espy, MD   [facility] [address] [place], [place] [address] [phone]

## 2021-06-25 ENCOUNTER — Ambulatory Visit
Admission: RE | Admit: 2021-06-25 | Discharge: 2021-06-25 | Disposition: A | Discharge status: Home / Self Care | Payer: Medicare Other | Source: Ambulatory Visit | Attending: Urology | Admitting: Urology

## 2021-06-25 ENCOUNTER — Ambulatory Visit: Payer: Medicare Other | Admitting: Urology

## 2021-06-25 ENCOUNTER — Other Ambulatory Visit: Payer: Self-pay

## 2021-06-25 VITALS — BP 130/70 | HR 80 | Ht 65.0 in | Wt 135.0 lb

## 2021-06-25 DIAGNOSIS — N3281 Overactive bladder: Secondary | ICD-10-CM | POA: Diagnosis not present

## 2021-06-25 DIAGNOSIS — N200 Calculus of kidney: Secondary | ICD-10-CM

## 2021-06-25 LAB — BLADDER SCAN AMB NON-IMAGING: Scan Result: 59

## 2021-06-26 ENCOUNTER — Encounter: Payer: Self-pay | Admitting: Urology

## 2021-07-08 ENCOUNTER — Encounter: Payer: Self-pay | Admitting: Urology

## 2021-07-09 MED ORDER — VIBEGRON 75 MG PO TABS: 75.0000 mg | ORAL_TABLET | Freq: Every day | ORAL | 11 refills | Status: DC

## 2021-07-10 ENCOUNTER — Ambulatory Visit: Payer: Medicare Other | Admitting: Pulmonary Disease

## 2021-07-10 ENCOUNTER — Other Ambulatory Visit: Payer: Self-pay

## 2021-07-10 ENCOUNTER — Encounter: Payer: Self-pay | Admitting: Urology

## 2021-07-10 ENCOUNTER — Encounter: Payer: Self-pay | Admitting: Pulmonary Disease

## 2021-07-10 VITALS — BP 128/70 | HR 78 | Ht 65.0 in | Wt 134.2 lb

## 2021-07-10 DIAGNOSIS — J432 Centrilobular emphysema: Secondary | ICD-10-CM

## 2021-07-10 MED ORDER — STIOLTO RESPIMAT 2.5-2.5 MCG/ACT IN AERS: 2.0000 | INHALATION_SPRAY | Freq: Every day | RESPIRATORY_TRACT | 5 refills | Status: DC

## 2021-07-10 NOTE — Progress Notes (Signed)
? ? ?Subjective:  ? ?PATIENT ID: Tiffany Leon GENDER: female DOB: 1951-01-07, MRN: [id number] ? ? ?HPI ? ?Chief Complaint  ?Patient presents with  ? Consult  ?  Sobe tired all the time  ? ? ?Reason for Visit: New consult for COPD ? ?Tiffany Leon is a 71 year old female former smoker with COPD, chronic systolic heart failure, hypertension, CKD stage III Waldenstrom's macroglobulinemia s/p cytoxan/rituxan (ended 10/2020), hypothyroidism who presents as a new consult for COPD. ? ?She was previously hospitalized in June 2020 for chest pain and shortness of breath.  Chest imaging at that time showed aortic atherosclerosis, CAD and bilateral pleural effusions.  Echo with EF 40 to 50% which normalized as an outpatient. ? ?She reports shortness of breath that has gradually worsened in the last three years. Attributes to this when she began having anemia around that time and decreased activity. Daily cough with clear sputum daily. Never wheezing. Walking will tire her out and will need breaks. Going to grocery is strenuous and needs pauses with carrying groceries. She had COVID in September 2022. Has completed chemo in July 2022. She is not currently on any inhalers but has used them when she was on COVID.  ? ?Social History: ?Active smoker. 20 pack years. ?Currently smoking 1/2 ppd ? ?I have personally reviewed patient's past medical/family/social history, allergies, current medications. ? ?Past Medical History:  ?Diagnosis Date  ? Acute kidney injury ([place])   ? a. 09/2018  ? Anxiety   ? Aortic atherosclerosis ([place])   ? a. 09/2018 noted on Chest CT.  ? Chronic combined systolic (congestive) and diastolic (congestive) heart failure ([place])   ? a.) 09/2018 Echo: EF 45-50%, diff HK. Triv to small circumfirential pericardial eff. b.) TTE 02/14/2020: EF 60-65%; no RWMAs; G1DD; GLS -23.3%  ? Chronic DOE (dyspnea on exertion)   ? Claudication [facility])   ? a. Bilat hip claudication since ~ 2019.  ? COPD (chronic obstructive pulmonary  disease) ([place])   ? Coronary artery calcification seen on CT scan 09/20/2018  ? Depression   ? Dyspnea   ? Emphysema lung ([place])   ? Exertional angina (HCC)   ? a. Ex angina since ~ 2019.  ? GERD (gastroesophageal reflux disease)   ? Goals of care, counseling/discussion 11/03/2018  ? Hiatal hernia   ? History of 2019 novel coronavirus disease (COVID-19) 01/16/2021  ? History of bronchitis   ? History of kidney stones   ? Hypothyroidism   ? Pleural effusion   ? a. 09/2018 s/p thoracentesis-->968m.  ? Resting tremor   ? Tobacco abuse   ? [place]'s [place]globulinemia ([facility] 11/03/2018  ?  ? ?Family History  ?Problem Relation Age of Onset  ? Stroke Mother   ?     Mini-strokes. Died @ 630  ? Dementia Mother   ? Lung cancer Father   ?     Died in his 747's ? Addison's disease Sister   ? Hypertension Brother   ? CAD Brother   ? Hypertension Brother   ?  ? ?Social History  ? ?Occupational History  ? Not on file  ?Tobacco Use  ? Smoking status: Every Day  ?  Packs/day: 0.50  ?  Years: 40.00  ?  Pack years: 20.00  ?  Types: Cigarettes  ? Smokeless tobacco: Never  ?Vaping Use  ? Vaping Use: Never used  ?Substance and Sexual Activity  ? Alcohol use: Not Currently  ?  Comment: 1-2 gl wine / night - none since  07/2018  ? Drug use: Not Currently  ?  Comment: prev used drugs ~ 35 yrs ago.  ? Sexual activity: Not on file  ? ? ?Allergies  ?Allergen Reactions  ? Codeine Nausea And Vomiting  ?  ? ?Outpatient Medications Prior to Visit  ?Medication Sig Dispense Refill  ? Acetaminophen (ACETAMINOPHEN EXTRA STRENGTH) 500 MG capsule Take 1,000 mg by mouth every 6 (six) hours as needed for pain.    ? carvedilol (COREG) 12.5 MG tablet TAKE 1 TABLET (12.5 MG TOTAL) BY MOUTH 2 (TWO) TIMES DAILY WITH A MEAL. (Patient taking differently: Take 12.5 mg by mouth in the morning.) 180 tablet 3  ? clonazePAM (KLONOPIN) 0.5 MG tablet Take 1 tablet (0.5 mg total) by mouth 2 (two) times daily. 180 tablet 1  ? DULoxetine (CYMBALTA) 60 MG capsule Take 1  capsule (60 mg total) by mouth daily. 90 capsule 1  ? traZODone (DESYREL) 100 MG tablet Take 1-1.5 tablets (100-150 mg total) by mouth at bedtime as needed for sleep. (Patient taking differently: Take 150 mg by mouth at bedtime.) 135 tablet 1  ? triamcinolone ointment (KENALOG) 0.1 % Apply 1 application topically daily as needed (eczema).    ? lidocaine-prilocaine (EMLA) cream Apply 1 application topically as needed. Apply to [facility] 1 hour before access (Patient not taking: Reported on 07/10/2021) 60 g 3  ? Vibegron 75 MG TABS Take 75 mg by mouth daily. (Patient not taking: Reported on 07/10/2021) 30 tablet 11  ? ?Facility-Administered Medications Prior to Visit  ?Medication Dose Route Frequency Provider Last Rate Last Admin  ? sodium chloride flush (NS) 0.9 % injection 10 mL  10 mL Intravenous PRN [name], MD   10 mL at 02/03/21 1210  ? ? ?Review of Systems  ?Constitutional:  Negative for chills, diaphoresis, fever, malaise/fatigue and weight loss.  ?HENT:  Negative for congestion.   ?Respiratory:  Positive for cough and shortness of breath. Negative for hemoptysis, sputum production and wheezing.   ?Cardiovascular:  Negative for chest pain, palpitations and leg swelling.  ? ? ?Objective:  ? ?Vitals:  ? 07/10/21 0919  ?BP: 128/70  ?Pulse: 78  ?SpO2: 96%  ?Weight: 134 lb 3.2 oz (60.9 kg)  ?Height: '5\' 5"'$  (1.651 m)  ? ?SpO2: 96 % ?O2 Device: None (Room air) ? ?Physical Exam: ?General: Well-appearing, no acute distress ?HENT: [place], AT ?Eyes: EOMI, no scleral icterus ?Respiratory: Clear to auscultation bilaterally.  No crackles, wheezing or rales ?Cardiovascular: RRR, -M/R/G, no JVD ?Extremities:-Edema,-tenderness ?Neuro: AAO x4, CNII-XII grossly intact ?Psych: Normal mood, normal affect ? ?Data Reviewed: ? ?Imaging: ?CT chest 09/20/2018-bilateral pleural effusions right >left.  Small pericardial effusion ?CT chest 10/18/2018-centrilobular emphysema.  Right lower lobe atelectasis with right pleural effusion ?CXR  01/16/2021- Right PAC, hyperinflation.  No infiltrate, effusion or edema. ? ?PFT: ?09/14/2013  ?FVC 2.96 (90%) FEV1 2.10 (83%) ratio 72 TLC 116% DLCO 72% ?Interpretation: Based on ATS guidelines, mild obstructive defect mildly reduced DLCO.  No significant bronchodilator response.  F-V consistent with obstructive defect possible emphysema however absence of overinflation is inconsistent with this.  Clinically correlate ? ?Labs: ?CBC ?   ?Component Value Date/Time  ? WBC 4.0 06/05/2021 0850  ? WBC 3.9 (L) 05/15/2021 1047  ? RBC 3.41 (L) 06/05/2021 0850  ? HGB 11.2 (L) 06/05/2021 0850  ? HCT 33.2 (L) 06/05/2021 0850  ? PLT 238 06/05/2021 0850  ? MCV 97.4 06/05/2021 0850  ? MCH 32.8 06/05/2021 0850  ? MCHC 33.7 06/05/2021 0850  ?  RDW 12.6 06/05/2021 0850  ? LYMPHSABS 0.9 06/05/2021 0850  ? MONOABS 0.3 06/05/2021 0850  ? EOSABS 0.1 06/05/2021 0850  ? BASOSABS 0.0 06/05/2021 0850  ? ?Absolute eosinophils 06/05/2021-100 ? ?   ?Assessment & Plan:  ? ?Discussion: ? 71 year old female active smoker with COPD, chronic systolic heart failure, hypertension, CKD stage III Waldenstrom's macroglobulinemia s/p cytoxan/rituxan, hypothyroidism who presents as a new consult for COPD. Discussed clinical course and management of COPD including bronchodilator regimen and action plan for exacerbation. ? ?Emphysema ?--START Stiolto TWO puffs ONCE a day.  ? ?Tobacco abuse ?--Discuss cessation at next visit ? ?Health Maintenance ?Immunization History  ?Administered Date(s) Administered  ? Influenza, High Dose Seasonal PF 02/27/2019  ? Moderna Sars-Covid-2 Vaccination 02/28/2020  ? PFIZER(Purple Top)SARS-COV-2 Vaccination 06/11/2019, 07/02/2019, 02/28/2020  ? Pneumococcal Conjugate-13 11/02/2017  ? Tdap 05/02/2012  ? ?CT Lung Screen - qualified. Discuss at next visit ? ?No orders of the defined types were placed in this encounter. ? ?Meds ordered this encounter  ?Medications  ? Tiotropium Bromide-Olodaterol (STIOLTO RESPIMAT) 2.5-2.5 MCG/ACT  AERS  ?  Sig: Inhale 2 puffs into the lungs daily at 2 PM.  ?  Dispense:  4 g  ?  Refill:  5  ? ? ?Return in about 2 months (around 09/09/2021). ? ?I have spent a total time of 45-minutes on the day of the appointment exten

## 2021-07-10 NOTE — Patient Instructions (Signed)
?  Emphysema ?--START Stiolto TWO puffs ONCE a day. Rx and samples provided ? ?Follow-up with me in 2 months ? ? ?

## 2021-07-10 NOTE — Telephone Encounter (Signed)
Patient states pharmacy is out of [place], but is able to fill Oxybutynin which is much more affordable. Advised pt to let us know if she needs a prescription for oxybutynin to reach out to our office. ?

## 2021-07-13 ENCOUNTER — Telehealth: Payer: Self-pay

## 2021-07-13 MED ORDER — OXYBUTYNIN CHLORIDE ER 10 MG PO TB24: 10.0000 mg | ORAL_TABLET | Freq: Every day | ORAL | 11 refills | Status: DC

## 2021-07-13 NOTE — Telephone Encounter (Signed)
Patient states she can not get gemtesa, and does not see the difference between gemtesa and oxybuytnin. Pt would like to restart the oxybutynin.  ?

## 2021-08-04 ENCOUNTER — Other Ambulatory Visit: Payer: Self-pay | Admitting: Family Medicine

## 2021-08-04 DIAGNOSIS — S0990XA Unspecified injury of head, initial encounter: Secondary | ICD-10-CM

## 2021-08-04 DIAGNOSIS — R42 Dizziness and giddiness: Secondary | ICD-10-CM

## 2021-08-04 DIAGNOSIS — R55 Syncope and collapse: Secondary | ICD-10-CM

## 2021-08-04 DIAGNOSIS — W19XXXA Unspecified fall, initial encounter: Secondary | ICD-10-CM

## 2021-08-06 ENCOUNTER — Ambulatory Visit: Payer: Medicare Other | Admitting: Psychiatry

## 2021-08-06 ENCOUNTER — Encounter: Payer: Self-pay | Admitting: Psychiatry

## 2021-08-06 DIAGNOSIS — F4001 Agoraphobia with panic disorder: Secondary | ICD-10-CM | POA: Diagnosis not present

## 2021-08-06 DIAGNOSIS — F330 Major depressive disorder, recurrent, mild: Secondary | ICD-10-CM | POA: Diagnosis not present

## 2021-08-06 DIAGNOSIS — R296 Repeated falls: Secondary | ICD-10-CM | POA: Diagnosis not present

## 2021-08-06 DIAGNOSIS — F5105 Insomnia due to other mental disorder: Secondary | ICD-10-CM

## 2021-08-06 MED ORDER — CLONAZEPAM 0.5 MG PO TABS: ORAL_TABLET | ORAL | 1 refills | Status: DC

## 2021-08-06 MED ORDER — TRAZODONE HCL 100 MG PO TABS: 150.0000 mg | ORAL_TABLET | Freq: Every day | ORAL | 1 refills | Status: DC

## 2021-08-06 MED ORDER — DULOXETINE HCL 60 MG PO CPEP: 60.0000 mg | ORAL_CAPSULE | Freq: Every day | ORAL | 1 refills | Status: DC
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
## 2021-08-13 ENCOUNTER — Encounter: Payer: Self-pay | Admitting: Hematology & Oncology

## 2021-08-13 ENCOUNTER — Encounter: Payer: Self-pay | Admitting: Urology

## 2021-08-13 MED ORDER — OXYBUTYNIN CHLORIDE ER 15 MG PO TB24: 15.0000 mg | ORAL_TABLET | Freq: Every day | ORAL | 1 refills | Status: DC

## 2021-08-14 ENCOUNTER — Other Ambulatory Visit: Payer: Self-pay | Admitting: *Deleted

## 2021-08-14 DIAGNOSIS — D631 Anemia in chronic kidney disease: Secondary | ICD-10-CM

## 2021-08-14 DIAGNOSIS — C880 Waldenstrom macroglobulinemia: Secondary | ICD-10-CM

## 2021-08-17 ENCOUNTER — Inpatient Hospital Stay: Payer: Medicare Other | Attending: Hematology & Oncology

## 2021-08-17 DIAGNOSIS — C880 Waldenstrom macroglobulinemia: Secondary | ICD-10-CM | POA: Diagnosis present

## 2021-08-17 DIAGNOSIS — N1831 Chronic kidney disease, stage 3a: Secondary | ICD-10-CM

## 2021-08-17 LAB — CBC WITH DIFFERENTIAL (CANCER CENTER ONLY)
Abs Immature Granulocytes: 0.24 10*3/uL — ABNORMAL HIGH (ref 0.00–0.07)
Basophils Absolute: 0 10*3/uL (ref 0.0–0.1)
Basophils Relative: 1 %
Eosinophils Absolute: 0.1 10*3/uL (ref 0.0–0.5)
Eosinophils Relative: 1 %
HCT: 38.7 % (ref 36.0–46.0)
Hemoglobin: 13 g/dL (ref 12.0–15.0)
Immature Granulocytes: 6 %
Lymphocytes Relative: 34 %
Lymphs Abs: 1.5 10*3/uL (ref 0.7–4.0)
MCH: 33.2 pg (ref 26.0–34.0)
MCHC: 33.6 g/dL (ref 30.0–36.0)
MCV: 98.7 fL (ref 80.0–100.0)
Monocytes Absolute: 0.3 10*3/uL (ref 0.1–1.0)
Monocytes Relative: 7 %
Neutro Abs: 2.2 10*3/uL (ref 1.7–7.7)
Neutrophils Relative %: 51 %
Platelet Count: 229 10*3/uL (ref 150–400)
RBC: 3.92 MIL/uL (ref 3.87–5.11)
RDW: 13.8 % (ref 11.5–15.5)
Smear Review: NORMAL
WBC Count: 4.4 10*3/uL (ref 4.0–10.5)
nRBC: 0 % (ref 0.0–0.2)

## 2021-08-28 ENCOUNTER — Ambulatory Visit
Admission: RE | Admit: 2021-08-28 | Discharge: 2021-08-28 | Disposition: A | Discharge status: Home / Self Care | Payer: Medicare Other | Source: Ambulatory Visit | Attending: Family Medicine | Admitting: Family Medicine

## 2021-08-28 DIAGNOSIS — S0990XA Unspecified injury of head, initial encounter: Secondary | ICD-10-CM

## 2021-08-28 DIAGNOSIS — W19XXXA Unspecified fall, initial encounter: Secondary | ICD-10-CM

## 2021-08-28 DIAGNOSIS — R42 Dizziness and giddiness: Secondary | ICD-10-CM

## 2021-08-28 DIAGNOSIS — R55 Syncope and collapse: Secondary | ICD-10-CM

## 2021-09-07 ENCOUNTER — Ambulatory Visit: Payer: Medicare Other | Admitting: Pulmonary Disease

## 2021-09-07 NOTE — Progress Notes (Deleted)
Subjective:   PATIENT ID: Tiffany Leon GENDER: female DOB: Sep 05, 1950, MRN: [id number]   HPI  No chief complaint on file.   Reason for Visit: Follow-up  Tiffany Leon is a 71 year old female former smoker with COPD, chronic systolic heart failure, hypertension, CKD stage III Waldenstrom's macroglobulinemia s/p cytoxan/rituxan (ended 10/2020), hypothyroidism who presents for follow-up.  Initial consult She was previously hospitalized in June 2020 for chest pain and shortness of breath.  Chest imaging at that time showed aortic atherosclerosis, CAD and bilateral pleural effusions.  Echo with EF 40 to 50% which normalized as an outpatient. She reports shortness of breath that has gradually worsened in the last three years. Attributes to this when she began having anemia around that time and decreased activity. Daily cough with clear sputum daily. Never wheezing. Walking will tire her out and will need breaks. Going to grocery is strenuous and needs pauses with carrying groceries. She had COVID in September 2022. Has completed chemo in July 2022. She is not currently on any inhalers but has used them when she was on COVID.   09/07/21 ***Since her last visit she was started on Stiolto.  She is currently smoking  Social History: Active smoker. 20 pack years. Currently smoking 1/2 ppd   Past Medical History:  Diagnosis Date   Acute kidney injury ([place])    a. 09/2018   Anxiety    Aortic atherosclerosis ([place])    a. 09/2018 noted on Chest CT.   Chronic combined systolic (congestive) and diastolic (congestive) heart failure ([place])    a.) 09/2018 Echo: EF 45-50%, diff HK. Triv to small circumfirential pericardial eff. b.) TTE 02/14/2020: EF 60-65%; no RWMAs; G1DD; GLS -23.3%   Chronic DOE (dyspnea on exertion)    Claudication (HCC)    a. Bilat hip claudication since ~ 2019.   COPD (chronic obstructive pulmonary disease) (HCC)    Coronary artery calcification seen on CT scan 09/20/2018    Depression    Dyspnea    Emphysema lung (HCC)    Exertional angina ([place])    a. Ex angina since ~ 2019.   GERD (gastroesophageal reflux disease)    Goals of care, counseling/discussion 11/03/2018   Hiatal hernia    History of 2019 novel coronavirus disease (COVID-19) 01/16/2021   History of bronchitis    History of kidney stones    Hypothyroidism    Pleural effusion    a. 09/2018 s/p thoracentesis-->939m.   Resting tremor    Tobacco abuse    Waldenstrom's macroglobulinemia ([facility] 11/03/2018     Family History  Problem Relation Age of Onset   Stroke Mother        Mini-strokes. Died @ 630   Dementia Mother    Lung cancer Father        Died in his 740's  Addison's disease Sister    Hypertension Brother    CAD Brother    Hypertension Brother      Social History   Occupational History   Not on file  Tobacco Use   Smoking status: Every Day    Packs/day: 0.50    Years: 40.00    Pack years: 20.00    Types: Cigarettes   Smokeless tobacco: Never  Vaping Use   Vaping Use: Never used  Substance and Sexual Activity   Alcohol use: Not Currently    Comment: 1-2 gl wine / night - none since 07/2018   Drug use: Not Currently    Comment: prev used drugs ~  35 yrs ago.   Sexual activity: Not on file    Allergies  Allergen Reactions   Codeine Nausea And Vomiting     Outpatient Medications Prior to Visit  Medication Sig Dispense Refill   Acetaminophen (ACETAMINOPHEN EXTRA STRENGTH) 500 MG capsule Take 1,000 mg by mouth every 6 (six) hours as needed for pain.     carvedilol (COREG) 12.5 MG tablet TAKE 1 TABLET (12.5 MG TOTAL) BY MOUTH 2 (TWO) TIMES DAILY WITH A MEAL. (Patient taking differently: Take 12.5 mg by mouth in the morning.) 180 tablet 3   clonazePAM (KLONOPIN) 0.5 MG tablet 1/2 tablet in AM and 1 tablet in PM and 1/2 tablet prn anxiety 180 tablet 1   DULoxetine (CYMBALTA) 60 MG capsule Take 1 capsule (60 mg total) by mouth daily. 90 capsule 1   lidocaine-prilocaine  (EMLA) cream Apply 1 application topically as needed. Apply to [facility] 1 hour before access 60 g 3   oxybutynin (DITROPAN XL) 15 MG 24 hr tablet Take 1 tablet (15 mg total) by mouth daily. 30 tablet 1   Tiotropium Bromide-Olodaterol (STIOLTO RESPIMAT) 2.5-2.5 MCG/ACT AERS Inhale 2 puffs into the lungs daily at 2 PM. 4 g 5   traZODone (DESYREL) 100 MG tablet Take 1.5 tablets (150 mg total) by mouth at bedtime. 135 tablet 1   triamcinolone ointment (KENALOG) 0.1 % Apply 1 application topically daily as needed (eczema).     Facility-Administered Medications Prior to Visit  Medication Dose Route Frequency Provider Last Rate Last Admin   sodium chloride flush (NS) 0.9 % injection 10 mL  10 mL Intravenous PRN [name], MD   10 mL at 02/03/21 1210    ROS   Objective:   There were no vitals filed for this visit.     Physical Exam: General: Well-appearing, no acute distress HENT: [place], AT Eyes: EOMI, no scleral icterus Respiratory: Clear to auscultation bilaterally.  No crackles, wheezing or rales Cardiovascular: RRR, -M/R/G, no JVD Extremities:-Edema,-tenderness Neuro: AAO x4, CNII-XII grossly intact Psych: Normal mood, normal affect   Data Reviewed:  Imaging: CT chest 09/20/2018-bilateral pleural effusions right >left.  Small pericardial effusion CT chest 10/18/2018-centrilobular emphysema.  Right lower lobe atelectasis with right pleural effusion CXR 01/16/2021- Right PAC, hyperinflation.  No infiltrate, effusion or edema.  PFT: 09/14/2013  FVC 2.96 (90%) FEV1 2.10 (83%) ratio 72 TLC 116% DLCO 72% Interpretation: Based on ATS guidelines, mild obstructive defect mildly reduced DLCO.  No significant bronchodilator response.  F-V consistent with obstructive defect possible emphysema however absence of overinflation is inconsistent with this.  Clinically correlate  Labs: CBC    Component Value Date/Time   WBC 4.4 08/17/2021 0820   WBC 3.9 (L) 05/15/2021 1047   RBC 3.92 08/17/2021  0820   HGB 13.0 08/17/2021 0820   HCT 38.7 08/17/2021 0820   PLT 229 08/17/2021 0820   MCV 98.7 08/17/2021 0820   MCH 33.2 08/17/2021 0820   MCHC 33.6 08/17/2021 0820   RDW 13.8 08/17/2021 0820   LYMPHSABS 1.5 08/17/2021 0820   MONOABS 0.3 08/17/2021 0820   EOSABS 0.1 08/17/2021 0820   BASOSABS 0.0 08/17/2021 0820   Absolute eosinophils 06/05/2021-100     Assessment & Plan:   Discussion:  71 year old female active smoker with COPD, chronic systolic heart failure, hypertension, CKD stage III Waldenstrom's macroglobulinemia s/p cytoxan/rituxan, hypothyroidism who presents as a new consult for COPD. Discussed clinical course and management of COPD including bronchodilator regimen and action plan for exacerbation.  71 year old active smoker  with COPD, hypertension, CKD stage III, Waldenstrom's macroglobulinemia s/p Cytoxan/Rituxan, hypothyroidism who presents for follow-up for COPD.  Emphysema --START Stiolto TWO puffs ONCE a day.   Tobacco abuse --Discuss cessation at next visit  Health Maintenance Immunization History  Administered Date(s) Administered   Influenza, High Dose Seasonal PF 02/27/2019   Moderna Sars-Covid-2 Vaccination 02/28/2020   PFIZER(Purple Top)SARS-COV-2 Vaccination 06/11/2019, 07/02/2019, 02/28/2020   Pneumococcal Conjugate-13 11/02/2017   Tdap 05/02/2012   CT Lung Screen - qualified. Discuss at next visit  No orders of the defined types were placed in this encounter.  No orders of the defined types were placed in this encounter.   No follow-ups on file.  I have spent a total time of***-minutes on the day of the appointment reviewing prior documentation, coordinating care and discussing medical diagnosis and plan with the patient/family. Past medical history, allergies, medications were reviewed. Pertinent imaging, labs and tests included in this note have been reviewed and interpreted independently by me.  Morrow, MD [facility] 09/07/2021 7:55 AM  Office Number [phone]

## 2021-09-17 ENCOUNTER — Encounter: Payer: Self-pay | Admitting: Pulmonary Disease

## 2021-09-17 ENCOUNTER — Ambulatory Visit: Payer: Medicare Other | Admitting: Pulmonary Disease

## 2021-09-17 VITALS — BP 122/80 | HR 76 | Temp 97.6°F | Ht 65.5 in | Wt 132.2 lb

## 2021-09-17 DIAGNOSIS — J432 Centrilobular emphysema: Secondary | ICD-10-CM | POA: Diagnosis not present

## 2021-09-17 MED ORDER — TRELEGY ELLIPTA 200-62.5-25 MCG/ACT IN AEPB: 1.0000 | INHALATION_SPRAY | Freq: Every day | RESPIRATORY_TRACT | 5 refills | Status: DC

## 2021-09-17 MED ORDER — TRELEGY ELLIPTA 200-62.5-25 MCG/ACT IN AEPB: 1.0000 | INHALATION_SPRAY | Freq: Every day | RESPIRATORY_TRACT | 0 refills | Status: DC

## 2021-09-17 NOTE — Progress Notes (Signed)
Subjective:   PATIENT ID: Tiffany Leon GENDER: female DOB: [birth year], MRN: [id number]   HPI  Chief Complaint  Patient presents with   Follow-up    Reason for Visit: Follow-up  Tiffany Leon is a 71 year old female former smoker with COPD, chronic systolic heart failure, hypertension, CKD stage III Waldenstrom's macroglobulinemia s/p cytoxan/rituxan (ended 10/2020), hypothyroidism who presents for follow-up.  Initial consult She was previously hospitalized in June 2020 for chest pain and shortness of breath.  Chest imaging at that time showed aortic atherosclerosis, CAD and bilateral pleural effusions.  Echo with EF 40 to 50% which normalized as an outpatient. She reports shortness of breath that has gradually worsened in the last three years. Attributes to this when she began having anemia around that time and decreased activity. Daily cough with clear sputum daily. Never wheezing. Walking will tire her out and will need breaks. Going to grocery is strenuous and needs pauses with carrying groceries. She had COVID in September 2022. Has completed chemo in July 2022. She is not currently on any inhalers but has used them when she was on COVID.   09/17/21 Since her last visit she was started on Stiolto she reports partial benefit. No nocturnal symptoms. Still feels fatigued. Limited by shortness of breath. Coughs daily which is related to her esophagus. On Prilosec and planning for dilation this summer. She is currently smoking 3/4 ppd. Has previously quit for 6 months in the past but restarted when her husband restarted. Has not exercised routinely but has a stationary bike that is available at home.  Social History: Active smoker. 20 pack years. Currently smoking 1/2 ppd   Past Medical History:  Diagnosis Date   Acute kidney injury ([place])    a. 09/2018   Anxiety    Aortic atherosclerosis ([place])    a. 09/2018 noted on Chest CT.   Chronic combined systolic (congestive) and diastolic  (congestive) heart failure ([place])    a.) 09/2018 Echo: EF 45-50%, diff HK. Triv to small circumfirential pericardial eff. b.) TTE 02/14/2020: EF 60-65%; no RWMAs; G1DD; GLS -23.3%   Chronic DOE (dyspnea on exertion)    Claudication (HCC)    a. Bilat hip claudication since ~ 2019.   COPD (chronic obstructive pulmonary disease) (HCC)    Coronary artery calcification seen on CT scan 09/20/2018   Depression    Dyspnea    Emphysema lung (HCC)    Exertional angina ([place])    a. Ex angina since ~ 2019.   GERD (gastroesophageal reflux disease)    Goals of care, counseling/discussion 11/03/2018   Hiatal hernia    History of 2019 novel coronavirus disease (COVID-19) 01/16/2021   History of bronchitis    History of kidney stones    Hypothyroidism    Pleural effusion    a. 09/2018 s/p thoracentesis-->935m.   Resting tremor    Tobacco abuse    Waldenstrom's macroglobulinemia ([facility] 11/03/2018     Family History  Problem Relation Age of Onset   Stroke Mother        Mini-strokes. Died @ 636   Dementia Mother    Lung cancer Father        Died in his 779's  Addison's disease Sister    Hypertension Brother    CAD Brother    Hypertension Brother      Social History   Occupational History   Not on file  Tobacco Use   Smoking status: Every Day    Packs/day: 0.50  Years: 40.00    Pack years: 20.00    Types: Cigarettes   Smokeless tobacco: Never  Vaping Use   Vaping Use: Never used  Substance and Sexual Activity   Alcohol use: Not Currently    Comment: 1-2 gl wine / night - none since 07/2018   Drug use: Not Currently    Comment: prev used drugs ~ 35 yrs ago.   Sexual activity: Not on file    Allergies  Allergen Reactions   Codeine Nausea And Vomiting     Outpatient Medications Prior to Visit  Medication Sig Dispense Refill   Acetaminophen (ACETAMINOPHEN EXTRA STRENGTH) 500 MG capsule Take 1,000 mg by mouth every 6 (six) hours as needed for pain.     carvedilol (COREG) 12.5  MG tablet TAKE 1 TABLET (12.5 MG TOTAL) BY MOUTH 2 (TWO) TIMES DAILY WITH A MEAL. (Patient taking differently: Take 12.5 mg by mouth in the morning.) 180 tablet 3   clonazePAM (KLONOPIN) 0.5 MG tablet 1/2 tablet in AM and 1 tablet in PM and 1/2 tablet prn anxiety 180 tablet 1   DULoxetine (CYMBALTA) 60 MG capsule Take 1 capsule (60 mg total) by mouth daily. 90 capsule 1   lidocaine-prilocaine (EMLA) cream Apply 1 application topically as needed. Apply to [facility] 1 hour before access 60 g 3   oxybutynin (DITROPAN XL) 15 MG 24 hr tablet Take 1 tablet (15 mg total) by mouth daily. 30 tablet 1   traZODone (DESYREL) 100 MG tablet Take 1.5 tablets (150 mg total) by mouth at bedtime. 135 tablet 1   triamcinolone ointment (KENALOG) 0.1 % Apply 1 application topically daily as needed (eczema).     Tiotropium Bromide-Olodaterol (STIOLTO RESPIMAT) 2.5-2.5 MCG/ACT AERS Inhale 2 puffs into the lungs daily at 2 PM. 4 g 5   Facility-Administered Medications Prior to Visit  Medication Dose Route Frequency Provider Last Rate Last Admin   sodium chloride flush (NS) 0.9 % injection 10 mL  10 mL Intravenous PRN [name], MD   10 mL at 02/03/21 1210    Review of Systems  Constitutional:  Positive for malaise/fatigue. Negative for chills, diaphoresis, fever and weight loss.  HENT:  Negative for congestion.   Respiratory:  Positive for cough and shortness of breath. Negative for hemoptysis, sputum production and wheezing.   Cardiovascular:  Negative for chest pain, palpitations and leg swelling.    Objective:   Vitals:   09/17/21 0941  BP: 122/80  Pulse: 76  Temp: 97.6 F (36.4 C)  SpO2: 97%  Weight: 132 lb 3.2 oz (60 kg)  Height: 5' 5.5" (1.664 m)   SpO2: 97 % O2 Device: None (Room air)  Physical Exam: General: Well-appearing, no acute distress HENT: [place], AT Eyes: EOMI, no scleral icterus Respiratory: Clear to auscultation bilaterally.  No crackles, wheezing or rales Cardiovascular: RRR,  -M/R/G, no JVD Extremities:-Edema,-tenderness Neuro: AAO x4, CNII-XII grossly intact Psych: Normal mood, normal affect   Data Reviewed:  Imaging: CT chest 09/20/2018-bilateral pleural effusions right >left.  Small pericardial effusion CT chest 10/18/2018-centrilobular emphysema.  Right lower lobe atelectasis with right pleural effusion CXR 01/16/2021- Right PAC, hyperinflation.  No infiltrate, effusion or edema.  PFT: 09/14/2013  FVC 2.96 (90%) FEV1 2.10 (83%) ratio 72 TLC 116% DLCO 72% Interpretation: Based on ATS guidelines, mild obstructive defect mildly reduced DLCO.  No significant bronchodilator response.  F-V consistent with obstructive defect possible emphysema however absence of overinflation is inconsistent with this.  Clinically correlate  Labs: CBC  Component Value Date/Time   WBC 4.4 08/17/2021 0820   WBC 3.9 (L) 05/15/2021 1047   RBC 3.92 08/17/2021 0820   HGB 13.0 08/17/2021 0820   HCT 38.7 08/17/2021 0820   PLT 229 08/17/2021 0820   MCV 98.7 08/17/2021 0820   MCH 33.2 08/17/2021 0820   MCHC 33.6 08/17/2021 0820   RDW 13.8 08/17/2021 0820   LYMPHSABS 1.5 08/17/2021 0820   MONOABS 0.3 08/17/2021 0820   EOSABS 0.1 08/17/2021 0820   BASOSABS 0.0 08/17/2021 0820   Absolute eosinophils 06/05/2021-100     Assessment & Plan:   Discussion: 71 year old active smoker with COPD, hypertension, CKD stage III, Waldenstrom's macroglobulinemia status post Cytoxan/Rituxan hypothyroidism who presents for COPD follow-up. Partial improvement on LAMA/LABA. Discussed clinical course and management of COPD/asthma including bronchodilator regimen and action plan for exacerbation. Discussed how deconditioning can contribute to fatigue and shortness of breath. Recommended exercise. She declines pulmonary rehab for now; will readdress at next visit if she is unable to improve by next visit  Emphysema - remains symptomatic, not in exacerbation --STOP Stioloto --START Trelegy 200 ONE puff  ONCE a day --Encourage regular aerobic exercise with walking and stationary bike.  --Consider pulmonary rehab in the future  Tobacco abuse Discussed smoking cessation <3 min  Health Maintenance Immunization History  Administered Date(s) Administered   Influenza, High Dose Seasonal PF 02/27/2019   Moderna Sars-Covid-2 Vaccination 02/28/2020   PFIZER(Purple Top)SARS-COV-2 Vaccination 06/11/2019, 07/02/2019, 02/28/2020   Pneumococcal Conjugate-13 11/02/2017   Tdap 05/02/2012   CT Lung Screen - qualified. Discuss at next visit  No orders of the defined types were placed in this encounter.  Meds ordered this encounter  Medications   Fluticasone-Umeclidin-Vilant (TRELEGY ELLIPTA) 200-62.5-25 MCG/ACT AEPB    Sig: Inhale 1 puff into the lungs daily at 2 PM.    Dispense:  60 each    Refill:  5   DISCONTD: Fluticasone-Umeclidin-Vilant (TRELEGY ELLIPTA) 200-62.5-25 MCG/ACT AEPB    Sig: Inhale 1 puff into the lungs daily.    Dispense:  14 each    Refill:  0    Order Specific Question:   Lot Number?    Answer:   [id number]    Order Specific Question:   Manufacturer?    Answer:   [employer] [[id number]]    Return in about 5 months (around 02/17/2022).  I have spent a total time of 30-minutes on the day of the appointment reviewing prior documentation, coordinating care and discussing medical diagnosis and plan with the patient/family. Past medical history, allergies, medications were reviewed. Pertinent imaging, labs and tests included in this note have been reviewed and interpreted independently by me.  [place], MD [facility] 09/17/2021 7:19 PM  Office Number [phone]

## 2021-09-17 NOTE — Patient Instructions (Signed)
  Emphysema --STOP Stioloto --START Trelegy 200 ONE puff ONCE a day --Encourage regular aerobic exercise with walking and stationary bike.  --Consider pulmonary rehab in the future  Follow-up with me in 5 months

## 2021-09-29 NOTE — Progress Notes (Signed)
09/30/21 12:36 PM   Tiffany Leon 04/16/1951 [id number]  Referring provider:  Pixie Casino, MD [address] [place],  [place] [address] Chief Complaint  Patient presents with   Over Active Bladder      HPI: Tiffany Leon is a 71 y.o.female with a personal history of OAB, right renal pelvic stone, and UTIs, who presents today for a 3 month follow-up with UA and PVR.   She has a past medical history of Waldenstrom macroglobulinemia, COPD, stage III chronic kidney disease, kidney stones, and congestive heart failure.   She is noted to have had previous procedures for a 6 mm stone in the right renal pelvis without hydronephrosis visualized on CT scan in 2020.    KUB on 05/06/2021 revealed right nephrolithiasis measuring up to approximately 1.3 cm.   She is s/p cytoscopy with intravesical Botox injection and right ureteroscopy on 05/25/2021. Intraoperative findings: Fairly unremarkable bladder.  Botox injected without difficulty.  Uncomplicated right ureteroscopy, stent left on tether.She removed her own stent.    RUS on 06/19/2021 visualized bilateral renal calculi measuring up to 1.5 cm.   She reports that the increase in her oxybutynin to 15 mg XL has improved her urinary symptoms.  She had a few days where she had no accidents which is remarkable for her.  She is on others were much worse.  She has intermittent urinary frequency and she is interested in Botox again today.  Patient denies any modifying or aggravating factors.  Patient denies any gross hematuria, dysuria or suprapubic/flank pain.  Patient denies any fevers, chills, nausea or vomiting.    PMH: Past Medical History:  Diagnosis Date   Acute kidney injury ([place])    a. 09/2018   Anxiety    Aortic atherosclerosis ([place])    a. 09/2018 noted on Chest CT.   Chronic combined systolic (congestive) and diastolic (congestive) heart failure ([place])    a.) 09/2018 Echo: EF 45-50%, diff HK. Triv to small circumfirential  pericardial eff. b.) TTE 02/14/2020: EF 60-65%; no RWMAs; G1DD; GLS -23.3%   Chronic DOE (dyspnea on exertion)    Claudication (HCC)    a. Bilat hip claudication since ~ 2019.   COPD (chronic obstructive pulmonary disease) (HCC)    Coronary artery calcification seen on CT scan 09/20/2018   Depression    Dyspnea    Emphysema lung (HCC)    Exertional angina ([place])    a. Ex angina since ~ 2019.   GERD (gastroesophageal reflux disease)    Goals of care, counseling/discussion 11/03/2018   Hiatal hernia    History of 2019 novel coronavirus disease (COVID-19) 01/16/2021   History of bronchitis    History of kidney stones    Hypothyroidism    Pleural effusion    a. 09/2018 s/p thoracentesis-->961m.   Resting tremor    Tobacco abuse    [place] 11/03/2018    Surgical History: Past Surgical History:  Procedure Laterality Date   BOTOX INJECTION N/A 05/25/2021   Procedure: BOTOX INJECTION;  Surgeon: BHollice Espy MD;  Location: [facility] ORS;  Service: Urology;  Laterality: N/A;   BREAST LUMPECTOMY WITH RADIOACTIVE SEED LOCALIZATION Left 12/28/2017   Procedure: BREAST LUMPECTOMY WITH RADIOACTIVE SEED LOCALIZATION;  Surgeon: TJovita Kussmaul MD;  Location: [facility]  Service: General;  Laterality: Left;   BREAST SURGERY     CYSTOSCOPY/URETEROSCOPY/HOLMIUM LASER/STENT PLACEMENT Right 05/25/2021   Procedure: CYSTOSCOPY/URETEROSCOPY/HOLMIUM LASER/STENT PLACEMENT;  Surgeon: BHollice Espy MD;  Location: [facility] ORS;  Service: Urology;  Laterality: Right;   DG THUMB RIGHT HAND ([facility] HX)     IR IMAGING GUIDED PORT INSERTION  11/09/2018   IR THORACENTESIS ASP PLEURAL SPACE W/IMG GUIDE  09/20/2018   KIDNEY STONE SURGERY      Home Medications:  Allergies as of 09/30/2021       Reactions   Codeine Nausea And Vomiting        Medication List        Accurate as of September 30, 2021 12:36 PM. If you have any questions, ask your nurse or doctor.          Acetaminophen  Extra Strength 500 MG capsule Generic drug: Acetaminophen Take 1,000 mg by mouth every 6 (six) hours as needed for pain.   carvedilol 12.5 MG tablet Commonly known as: COREG TAKE 1 TABLET (12.5 MG TOTAL) BY MOUTH 2 (TWO) TIMES DAILY WITH A MEAL. What changed: when to take this   clonazePAM 0.5 MG tablet Commonly known as: KLONOPIN 1/2 tablet in AM and 1 tablet in PM and 1/2 tablet prn anxiety   DULoxetine 60 MG capsule Commonly known as: CYMBALTA Take 1 capsule (60 mg total) by mouth daily.   lidocaine-prilocaine cream Commonly known as: EMLA Apply 1 application topically as needed. Apply to [facility] 1 hour before access   oxybutynin 15 MG 24 hr tablet Commonly known as: DITROPAN XL Take 1 tablet (15 mg total) by mouth daily.   traZODone 100 MG tablet Commonly known as: DESYREL Take 1.5 tablets (150 mg total) by mouth at bedtime.   Trelegy Ellipta 200-62.5-25 MCG/ACT Aepb Generic drug: Fluticasone-Umeclidin-Vilant Inhale 1 puff into the lungs daily at 2 PM.   triamcinolone ointment 0.1 % Commonly known as: KENALOG Apply 1 application topically daily as needed (eczema).        Allergies:  Allergies  Allergen Reactions   Codeine Nausea And Vomiting    Family History: Family History  Problem Relation Age of Onset   Stroke Mother        Mini-strokes. Died @ 99.   Dementia Mother    Lung cancer Father        Died in his 45's   Addison's disease Sister    Hypertension Brother    CAD Brother    Hypertension Brother     Social History:  reports that she has been smoking cigarettes. She has a 20.00 pack-year smoking history. She has never used smokeless tobacco. She reports that she does not currently use alcohol. She reports that she does not currently use drugs.   Physical Exam: BP 134/81   Pulse 64   Ht 5' 5.5" (1.664 m)   Wt 132 lb (59.9 kg)   BMI 21.63 kg/m   Constitutional:  Alert and oriented, No acute distress. HEENT: [place] AT, moist mucus membranes.   Trachea midline, no masses. Cardiovascular: No clubbing, cyanosis, or edema. Respiratory: Normal respiratory effort, no increased work of breathing. Skin: No rashes, bruises or suspicious lesions. Neurologic: Grossly intact, no focal deficits, moving all 4 extremities. Psychiatric: Normal mood and affect.  Laboratory Data:  Lab Results  Component Value Date   CREATININE 0.98 06/05/2021   Lab Results  Component Value Date   HGBA1C 4.6 (L) 09/28/2018    Pertinent Imaging: Results for orders placed or performed in visit on 09/30/21  BLADDER SCAN AMB NON-IMAGING  Result Value Ref Range   Scan Result 91 ml    Is not a true PVR, last voided about an hour ago, unable to  void today  Assessment & Plan:    OAB/urgency/urge incontinence - She is emptying adequately with a PVR of 91 mL.  -We will continue oxybutynin for 15 mg XL in addition to Botox for severe incontinence - S/p Botox. She has improvement on oxybutynin but it is intermittent. She would like to do repeat Botox.  - She is familiar with this procedure, we will plan for office-based procedure which we discussed her protocol, risks and benefits - Will need to return for pre-op urine culture a week before procedure   Botox Depo  Conley Rolls as a scribe for Hollice Espy, MD.,have documented all relevant documentation on the behalf of Hollice Espy, MD,as directed by  Hollice Espy, MD while in the presence of Hollice Espy, [place] [address], [place], [place] [address] [phone]

## 2021-09-30 ENCOUNTER — Ambulatory Visit: Payer: Medicare Other | Admitting: Urology

## 2021-09-30 VITALS — BP 134/81 | HR 64 | Ht 65.5 in | Wt 132.0 lb

## 2021-09-30 DIAGNOSIS — N3941 Urge incontinence: Secondary | ICD-10-CM

## 2021-09-30 DIAGNOSIS — N3281 Overactive bladder: Secondary | ICD-10-CM

## 2021-09-30 LAB — BLADDER SCAN AMB NON-IMAGING: Scan Result: 91

## 2021-10-02 ENCOUNTER — Telehealth: Payer: Self-pay | Admitting: *Deleted

## 2021-10-02 NOTE — Telephone Encounter (Signed)
Botox PA approval 10/02/21-01/02/22 #[id number]

## 2021-10-05 ENCOUNTER — Other Ambulatory Visit: Payer: Self-pay | Admitting: Pharmacist

## 2021-10-27 ENCOUNTER — Other Ambulatory Visit: Payer: Medicare Other

## 2021-11-03 ENCOUNTER — Other Ambulatory Visit: Payer: Self-pay | Admitting: Urology

## 2021-11-04 ENCOUNTER — Other Ambulatory Visit: Payer: Medicare Other

## 2021-11-06 ENCOUNTER — Other Ambulatory Visit: Payer: Medicare Other

## 2021-11-11 ENCOUNTER — Other Ambulatory Visit: Payer: Medicare Other

## 2021-11-11 ENCOUNTER — Telehealth: Payer: Self-pay | Admitting: *Deleted

## 2021-11-11 DIAGNOSIS — N3281 Overactive bladder: Secondary | ICD-10-CM

## 2021-11-11 LAB — URINALYSIS, COMPLETE
Bilirubin, UA: NEGATIVE
Glucose, UA: NEGATIVE
Ketones, UA: NEGATIVE
Nitrite, UA: POSITIVE — AB
Protein,UA: NEGATIVE
RBC, UA: NEGATIVE
Specific Gravity, UA: 1.015 (ref 1.005–1.030)
Urobilinogen, Ur: 0.2 mg/dL (ref 0.2–1.0)
pH, UA: 5 (ref 5.0–7.5)

## 2021-11-11 LAB — MICROSCOPIC EXAMINATION

## 2021-11-11 MED ORDER — SULFAMETHOXAZOLE-TRIMETHOPRIM 800-160 MG PO TABS: 1.0000 | ORAL_TABLET | Freq: Two times a day (BID) | ORAL | 0 refills | Status: DC

## 2021-11-11 NOTE — Telephone Encounter (Signed)
Called patient to inform UA sample has many bacteria-was sent for culture. Per Debroah Loop, PA will treat with Bactrim BID x 5 days sent to [facility]. Voiced understanding.

## 2021-11-14 LAB — CULTURE, URINE COMPREHENSIVE

## 2021-11-17 ENCOUNTER — Telehealth: Payer: Self-pay | Admitting: *Deleted

## 2021-11-17 NOTE — Telephone Encounter (Addendum)
Patient has been taking Bactrim. Voiced understanding.    ----- Message from Hollice Espy, MD sent at 11/16/2021  7:38 AM EDT ----- Preprocedure urine culture grew E. coli.  Please treat with Bactrim DS twice daily for 5 days starting today  Hollice Espy, MD

## 2021-11-18 ENCOUNTER — Ambulatory Visit: Payer: Medicare Other | Admitting: Urology

## 2021-11-18 VITALS — BP 120/76 | HR 80 | Ht 65.5 in | Wt 132.0 lb

## 2021-11-18 DIAGNOSIS — N3281 Overactive bladder: Secondary | ICD-10-CM | POA: Diagnosis not present

## 2021-11-18 MED ORDER — SULFAMETHOXAZOLE-TRIMETHOPRIM 800-160 MG PO TABS
1.0000 | ORAL_TABLET | Freq: Once | ORAL | Status: AC
  Administered 2021-11-18: 1 via ORAL

## 2021-11-18 MED ORDER — ONABOTULINUMTOXINA 100 UNITS IJ SOLR
100.0000 [IU] | Freq: Once | INTRAMUSCULAR | Status: AC
  Administered 2021-11-18: 100 [IU] via INTRAMUSCULAR

## 2021-11-18 MED ORDER — LIDOCAINE HCL 2 % IJ SOLN
50.0000 mL | Freq: Once | INTRAMUSCULAR | Status: AC
  Administered 2021-11-18: 1000 mg

## 2021-11-18 NOTE — Progress Notes (Signed)
In and Out Catheterization  Patient is present today for a I & O catheterization due to botox. Patient was cleaned and prepped in a sterile fashion with betadine . A 16 FR cath was inserted no complications were noted , 100 ml of urine return was noted, urine was yellow in color. Bladder was drained  And catheter was removed with out difficulty.    Performed by: Lesli Albee  Follow up/ Additional notes: 3 months

## 2021-11-18 NOTE — Patient Instructions (Signed)
Botulinum Toxin Bladder Injection  A botulinum toxin bladder injection is a procedure to treat an overactive bladder. During the procedure, a drug called botulinum toxin is injected into the bladder through a long, thin needle. This drug relaxes the bladder muscles and reduces overactivity. You may need this procedure if your medicines are not working or you cannot take them. The procedure may be repeated as needed. The treatment is done once and it usually lasts for 6 months. Your health care provider will monitor you to see how well you respond. Tell a health care provider about: Any allergies you have. All medicines you are taking, including vitamins, herbs, eye drops, creams, and over-the-counter medicines. Any problems you or family members have had with anesthetic medicines. Any bleeding problems you have. Any surgeries you have had. Any medical conditions you have. Any previous reactions to a botulinum toxin injection. Any symptoms of urinary tract infection. These include chills, fever, a burning feeling when passing urine, and needing to pass urine often. Whether you are pregnant or may be pregnant. What are the risks? Generally this is a safe procedure. However, problems may occur, including: Not being able to pass urine. If this happens, you may need to have your bladder emptied with a thin tube (urinary catheter). Bleeding. Urinary tract infection. Allergic reaction to the botulinum toxin. Pain or burning when passing urine. Damage to nearby structures or organs. What happens before the procedure? When to stop eating and drinking Follow instructions from your health care provider about what you may eat and drink before your procedure. These may include: 8 hours before the procedure Stop eating most foods. Do not eat meat, fried foods, or fatty foods. Eat only light foods, such as toast or crackers. All liquids are okay except energy drinks and alcohol. 6 hours before the  procedure Stop eating. Drink only clear liquids, such as water, clear fruit juice, black coffee, plain tea, and sports drinks. Do not drink energy drinks or alcohol. 2 hours before the procedure Stop drinking all liquids. You may be allowed to take medicines with small sips of water. If you do not follow your health care provider's instructions, your procedure may be delayed or canceled. Medicines Ask your health care provider about: Changing or stopping your regular medicines. This is especially important if you are taking diabetes medicines or blood thinners. Taking medicines such as aspirin and ibuprofen. These medicines can thin your blood. Do not take these medicines unless your health care provider tells you to take them. Taking over-the-counter medicines, vitamins, herbs, and supplements. General instructions Ask your health care provider what steps will be taken to help prevent infection. These steps may include: Removing hair at the procedure site. Washing skin with a germ-killing soap. Taking antibiotic medicine. If you will be going home right after the procedure, plan to have a responsible adult: Take you home from the hospital or clinic. You will not be allowed to drive. Care for you for the time you are told. What happens during the procedure?  You will be asked to empty your bladder. An IV will be inserted into one of your veins. You will be given one or more of the following: A medicine to help you relax (sedative). A medicine to numb the area (local anesthetic). A medicine to make you fall asleep (general anesthetic). A long, thin scope called a cystoscope will be passed into your bladder through the part of the body that carries urine from your bladder (urethra). The cystoscope   will be used to fill your bladder with water. A long needle will be passed through the cystoscope and into the bladder. The botulinum toxin will be injected into your bladder. It may be  injected into multiple areas of your bladder. The cystoscope will be removed and your bladder will be emptied with a urinary catheter. The procedure may vary among health care providers and hospitals. What can I expect after the procedure? After your procedure, it is common to have: Blood-tinged urine. Burning or soreness when you pass urine. Follow these instructions at home: Medicines Take over-the-counter and prescription medicines only as told by your health care provider. If you were prescribed an antibiotic medicine, take it as told by your health care provider. Do not stop using the antibiotic even if you start to feel better. General instructions  If you were given a sedative during the procedure, it can affect you for several hours. Do not drive or operate machinery until your health care provider says that it is safe. Drink enough fluid to keep your urine pale yellow. Return to your normal activities as told by your health care provider. Ask your health care provider what activities are safe for you. Keep all follow-up visits. Contact a health care provider if you have: A fever or chills. Blood-tinged urine for more than one day after your procedure. Worsening pain or burning when you pass urine. Pain or burning when passing urine for more than two days after your procedure. Trouble emptying your bladder. Get help right away if you: Have bright red blood in your urine. Are unable to pass urine. Summary A botulinum toxin bladder injection is a procedure to treat an overactive bladder. This is generally a safe procedure. However, problems may occur, including not being able to pass urine, bleeding, infection, pain, and an allergic reaction to the botulinum toxin. You will be told when to stop eating and drinking, and what medicines to change or stop. Follow instructions carefully. After the procedure, it is common to have blood in your urine and to have soreness or burning when  passing urine. Contact a health care provider if you have a fever, blood in your urine for more than a few days, or trouble passing urine. Get help right away if you have bright red blood in your urine, or if you are unable to pass urine. This information is not intended to replace advice given to you by your health care provider. Make sure you discuss any questions you have with your health care provider. Document Revised: 10/10/2020 Document Reviewed: 10/10/2020 Elsevier Patient Education  2023 [employer]  

## 2021-11-18 NOTE — Progress Notes (Signed)
   CC:  Chief Complaint  Patient presents with   Botulinum Toxin Injection    HPI:  Tiffany Leon is a 71 y.o. female with a personal history of OAB on Botox and oxybutynin, right renal pelvic stone, and UTIs,  who presents today for an office Botox injection.     She has a past medical history of Waldenstrom macroglobulinemia, COPD, stage III chronic kidney disease, kidney stones, and congestive heart failure.   She is noted to have had previous procedures for a 6 mm stone in the right renal pelvis without hydronephrosis visualized on CT scan in 2020.    KUB on 05/06/2021 revealed right nephrolithiasis measuring up to approximately 1.3 cm.   She is s/p cytoscopy with intravesical Botox injection and right ureteroscopy on 05/25/2021. Intraoperative findings: Fairly unremarkable bladder.  Botox injected without difficulty.  Uncomplicated right ureteroscopy, stent left on tether.She removed her own stent.    RUS on 06/19/2021 visualized bilateral renal calculi measuring up to 1.5 cm.   Preoperative UA/urine culture was obtained.  She is currently on antibiotics.   She continues on oxybutynin at this point in time.  Patient was administered the appropriate periprocedural antibiotics if indicated.  Lidocaine was allowed to dwell in the bladder for 30 minutes prior to the procedure, see CMA note.  Consent was confirmed.  All questions answered.  Timeout was performed.  Vitals:   11/18/21 1119  BP: 120/76  Pulse: 80  NED. A&Ox3.   No respiratory distress   Abd soft, NT, ND Normal external genitalia with patent urethral meatus  Cystoscopy Procedure Note   Patient identification was confirmed, informed consent was obtained, and patient was prepped using Betadine solution.  Lidocaine jelly was administered per urethral meatus.     Procedure: - Flexible cystoscope introduced, without any difficulty.   - Thorough search of the bladder revealed:    normal urethral meatus    normal  urothelium    no stones    no ulcers     no tumors    no urethral polyps    no trabeculation   - Ureteral orifices were normal in position and appearance.   A Botox injection needle was used to inject a total of 100 units which was reconstituted in a total of 10 cc of saline.  A total of 2 rows of 5 injections (10 mL) was injected into the muscularis of the bladder.  This was well-tolerated.  There is slight oozing from a few of the injection sites but no diffuse bleeding.   Post-Procedure: - Patient tolerated the procedure well   Plan to have her continue oxybutynin along with Botox for the time being, wean oxybutynin as possible if symptoms improve  Follow-up with PA in 3 months to reassess urinary symptoms and whether or not she like to continue Botox   Conley Rolls as a scribe for Hollice Espy, MD.,have documented all relevant documentation on the behalf of Hollice Espy, MD,as directed by  Hollice Espy, MD while in the presence of Hollice Espy, MD.  I have reviewed the above documentation for accuracy and completeness, and I agree with the above.   Hollice Espy, MD

## 2021-11-27 ENCOUNTER — Other Ambulatory Visit: Payer: Self-pay | Admitting: Urology

## 2021-12-03 ENCOUNTER — Inpatient Hospital Stay: Payer: Medicare Other

## 2021-12-03 ENCOUNTER — Inpatient Hospital Stay: Payer: Medicare Other | Attending: Hematology & Oncology

## 2021-12-03 ENCOUNTER — Encounter: Payer: Self-pay | Admitting: Hematology & Oncology

## 2021-12-03 ENCOUNTER — Inpatient Hospital Stay: Payer: Medicare Other | Admitting: Hematology & Oncology

## 2021-12-03 VITALS — BP 124/60 | HR 66 | Temp 98.5°F | Resp 17 | Wt 133.1 lb

## 2021-12-03 DIAGNOSIS — C880 Waldenstrom macroglobulinemia: Secondary | ICD-10-CM

## 2021-12-03 DIAGNOSIS — D649 Anemia, unspecified: Secondary | ICD-10-CM | POA: Diagnosis not present

## 2021-12-03 DIAGNOSIS — N189 Chronic kidney disease, unspecified: Secondary | ICD-10-CM | POA: Diagnosis not present

## 2021-12-03 DIAGNOSIS — D631 Anemia in chronic kidney disease: Secondary | ICD-10-CM

## 2021-12-03 LAB — LACTATE DEHYDROGENASE: LDH: 166 U/L (ref 98–192)

## 2021-12-03 LAB — CBC WITH DIFFERENTIAL (CANCER CENTER ONLY)
Abs Immature Granulocytes: 0.11 10*3/uL — ABNORMAL HIGH (ref 0.00–0.07)
Basophils Absolute: 0 10*3/uL (ref 0.0–0.1)
Basophils Relative: 0 %
Eosinophils Absolute: 0 10*3/uL (ref 0.0–0.5)
Eosinophils Relative: 1 %
HCT: 35 % — ABNORMAL LOW (ref 36.0–46.0)
Hemoglobin: 11.7 g/dL — ABNORMAL LOW (ref 12.0–15.0)
Immature Granulocytes: 2 %
Lymphocytes Relative: 23 %
Lymphs Abs: 1.2 10*3/uL (ref 0.7–4.0)
MCH: 34.5 pg — ABNORMAL HIGH (ref 26.0–34.0)
MCHC: 33.4 g/dL (ref 30.0–36.0)
MCV: 103.2 fL — ABNORMAL HIGH (ref 80.0–100.0)
Monocytes Absolute: 0.3 10*3/uL (ref 0.1–1.0)
Monocytes Relative: 6 %
Neutro Abs: 3.4 10*3/uL (ref 1.7–7.7)
Neutrophils Relative %: 68 %
Platelet Count: 218 10*3/uL (ref 150–400)
RBC: 3.39 MIL/uL — ABNORMAL LOW (ref 3.87–5.11)
RDW: 13.5 % (ref 11.5–15.5)
WBC Count: 5.1 10*3/uL (ref 4.0–10.5)
nRBC: 0 % (ref 0.0–0.2)

## 2021-12-03 LAB — CMP (CANCER CENTER ONLY)
ALT: 11 U/L (ref 0–44)
AST: 16 U/L (ref 15–41)
Albumin: 4.3 g/dL (ref 3.5–5.0)
Alkaline Phosphatase: 56 U/L (ref 38–126)
Anion gap: 7 (ref 5–15)
BUN: 19 mg/dL (ref 8–23)
CO2: 26 mmol/L (ref 22–32)
Calcium: 9.7 mg/dL (ref 8.9–10.3)
Chloride: 109 mmol/L (ref 98–111)
Creatinine: 1.06 mg/dL — ABNORMAL HIGH (ref 0.44–1.00)
GFR, Estimated: 55 mL/min — ABNORMAL LOW (ref >60)
Glucose, Bld: 106 mg/dL — ABNORMAL HIGH (ref 70–99)
Potassium: 4 mmol/L (ref 3.5–5.1)
Sodium: 142 mmol/L (ref 135–145)
Total Bilirubin: 0.3 mg/dL (ref 0.3–1.2)
Total Protein: 6.3 g/dL — ABNORMAL LOW (ref 6.5–8.1)

## 2021-12-03 LAB — FERRITIN: Ferritin: 155 ng/mL (ref 11–307)

## 2021-12-03 LAB — IRON AND IRON BINDING CAPACITY (CC-WL,HP ONLY)
Iron: 98 ug/dL (ref 28–170)
Saturation Ratios: 35 % — ABNORMAL HIGH (ref 10.4–31.8)
TIBC: 283 ug/dL (ref 250–450)
UIBC: 185 ug/dL (ref 148–442)

## 2021-12-03 MED ORDER — HEPARIN SOD (PORK) LOCK FLUSH 100 UNIT/ML IV SOLN
500.0000 [IU] | Freq: Once | INTRAVENOUS | Status: AC
  Administered 2021-12-03: 500 [IU] via INTRAVENOUS

## 2021-12-03 MED ORDER — SODIUM CHLORIDE 0.9% FLUSH
10.0000 mL | Freq: Once | INTRAVENOUS | Status: AC
  Administered 2021-12-03: 10 mL via INTRAVENOUS

## 2021-12-03 NOTE — Progress Notes (Signed)
Hematology and Oncology Follow Up Visit  Tiffany Leon [id number] 05/16/1950 71 y.o. 12/03/2021   Principle Diagnosis:  Waldenstrom's macroglobulinemia Anemia, chronic renal insufficiency, probable rheumatoid arthritis  Current Therapy:   Rituxan/Cytoxan-- start cycle #1 on 11/10/2018 -- cytoxan d/c'ed on 12/01/2018 Rituxan '375mg'$ /m2 IV q 2 months -- maintanence - completed on 10/2020 Retacrit 40,000 units sq for Hgb < 11   Interim History:  Tiffany Leon is here today for follow-up.  We see Tiffany Leon every 6 months.  Since we last saw Tiffany Leon, Tiffany Leon has been getting Botox into the bladder.  This does sound somewhat uncomfortable.  Tiffany Leon gets this every 6 months.  Tiffany Leon says that it is working for Tiffany Leon overactive bladder.  Tiffany Leon has been down to [place].  Tiffany Leon goes down in the springtime.  Tiffany Leon has been to the [place] at [facility].  Tiffany Leon does look quite tanned.  Tiffany Leon has had no obvious cough or shortness of breath.  Tiffany Leon has had no problem with COVID.  Tiffany Leon has had no nausea or vomiting.  Tiffany Leon has had no diarrhea.  When we last saw Tiffany Leon, Tiffany Leon IgM level was 72 mg/dL.  Tiffany Leon has had no leg swelling.  There has been no rashes.  Overall, I would say performance status is probably ECOG 0.    Medications:  Allergies as of 12/03/2021       Reactions   Codeine Nausea And Vomiting        Medication List        Accurate as of December 03, 2021  9:44 AM. If you have any questions, ask your nurse or doctor.          Acetaminophen Extra Strength 500 MG Caps Take 1,000 mg by mouth every 6 (six) hours as needed for pain.   carvedilol 12.5 MG tablet Commonly known as: COREG TAKE 1 TABLET (12.5 MG TOTAL) BY MOUTH 2 (TWO) TIMES DAILY WITH A MEAL. What changed: when to take this   clonazePAM 0.5 MG tablet Commonly known as: KLONOPIN 1/2 tablet in AM and 1 tablet in PM and 1/2 tablet prn anxiety   DULoxetine 60 MG capsule Commonly known as: CYMBALTA Take 1 capsule (60 mg total) by mouth daily.    lidocaine-prilocaine cream Commonly known as: EMLA Apply 1 application topically as needed. Apply to [facility] 1 hour before access   oxybutynin 15 MG 24 hr tablet Commonly known as: DITROPAN XL TAKE 1 TABLET (15 MG TOTAL) BY MOUTH DAILY.   traZODone 100 MG tablet Commonly known as: DESYREL Take 1.5 tablets (150 mg total) by mouth at bedtime.   Trelegy Ellipta 200-62.5-25 MCG/ACT Aepb Generic drug: Fluticasone-Umeclidin-Vilant Inhale 1 puff into the lungs daily at 2 PM.   triamcinolone ointment 0.1 % Commonly known as: KENALOG Apply 1 application topically daily as needed (eczema).        Allergies:  Allergies  Allergen Reactions   Codeine Nausea And Vomiting    Past Medical History, Surgical history, Social history, and Family History were reviewed and updated.  Review of Systems: Review of Systems  Constitutional: Negative.   HENT: Negative.    Eyes: Negative.   Respiratory: Negative.    Cardiovascular: Negative.   Gastrointestinal: Negative.   Genitourinary: Negative.   Musculoskeletal:  Positive for joint pain.  Skin: Negative.   Neurological: Negative.   Endo/Heme/Allergies: Negative.   Psychiatric/Behavioral: Negative.       Physical Exam:  weight is 133 lb 1.3 oz (60.4 kg). Tiffany Leon oral temperature is 98.5 F (36.9  C). Tiffany Leon blood pressure is 124/60 and Tiffany Leon pulse is 66. Tiffany Leon respiration is 17 and oxygen saturation is 99%.   Wt Readings from Last 3 Encounters:  12/03/21 133 lb 1.3 oz (60.4 kg)  11/18/21 132 lb (59.9 kg)  09/30/21 132 lb (59.9 kg)    Physical Exam Vitals reviewed.  HENT:     Head: Normocephalic and atraumatic.  Eyes:     Pupils: Pupils are equal, round, and reactive to light.  Cardiovascular:     Rate and Rhythm: Normal rate and regular rhythm.     Heart sounds: Normal heart sounds.  Pulmonary:     Effort: Pulmonary effort is normal.     Breath sounds: Normal breath sounds.  Abdominal:     General: Bowel sounds are normal.      Palpations: Abdomen is soft.  Musculoskeletal:        General: No tenderness or deformity. Normal range of motion.     Cervical back: Normal range of motion.  Lymphadenopathy:     Cervical: No cervical adenopathy.  Skin:    General: Skin is warm and dry.     Findings: No erythema or rash.  Neurological:     Mental Status: Tiffany Leon is alert and oriented to person, place, and time.  Psychiatric:        Behavior: Behavior normal.        Thought Content: Thought content normal.        Judgment: Judgment normal.      Lab Results  Component Value Date   WBC 5.1 12/03/2021   HGB 11.7 (L) 12/03/2021   HCT 35.0 (L) 12/03/2021   MCV 103.2 (H) 12/03/2021   PLT 218 12/03/2021   Lab Results  Component Value Date   FERRITIN 261 12/25/2019   IRON 122 12/25/2019   TIBC 270 12/25/2019   UIBC 148 12/25/2019   IRONPCTSAT 45 12/25/2019   Lab Results  Component Value Date   RETICCTPCT 2.2 12/25/2019   RBC 3.39 (L) 12/03/2021   Lab Results  Component Value Date   KPAFRELGTCHN 15.3 06/05/2021   LAMBDASER 13.5 06/05/2021   KAPLAMBRATIO 1.13 06/05/2021   Lab Results  Component Value Date   IGGSERUM 243 (L) 06/05/2021   IGA 60 (L) 06/05/2021   IGMSERUM 72 06/05/2021   Lab Results  Component Value Date   TOTALPROTELP 5.4 (L) 06/05/2021   ALBUMINELP 2.9 06/05/2021   A1GS 0.3 06/05/2021   A2GS 1.0 06/05/2021   BETS 0.9 06/05/2021   GAMS 0.3 (L) 06/05/2021   MSPIKE Not Observed 06/05/2021   SPEI Comment 12/25/2019     Chemistry      Component Value Date/Time   NA 142 12/03/2021 0818   K 4.0 12/03/2021 0818   CL 109 12/03/2021 0818   CO2 26 12/03/2021 0818   BUN 19 12/03/2021 0818   CREATININE 1.06 (H) 12/03/2021 0818      Component Value Date/Time   CALCIUM 9.7 12/03/2021 0818   ALKPHOS 56 12/03/2021 0818   AST 16 12/03/2021 0818   ALT 11 12/03/2021 0818   BILITOT 0.3 12/03/2021 0818       Impression and Plan: Tiffany Leon is a very pleasant 71 yo caucasian female with  Waldenstrm's macroglobulinemia.  Tiffany Leon has responded very well to Rituxan.  We initially had Tiffany Leon on Rituxan with Cytoxan but Tiffany Leon could not  tolerate the Cytoxan with Tiffany Leon blood counts going down.  Tiffany Leon did well with the maintenance Rituxan.  So far, Tiffany Leon has been in  remission.  Tiffany Leon renal function is not that.  Tiffany Leon does not need any Aranesp.  Again I just feel bad about these bladder injections.  They do seem to work for Tiffany Leon.  Right now, we will still plan to get Tiffany Leon back in 6 months.   [name], MD 8/17/20239:44 AM

## 2021-12-03 NOTE — Patient Instructions (Signed)

## 2021-12-04 LAB — KAPPA/LAMBDA LIGHT CHAINS
Kappa free light chain: 15 mg/L (ref 3.3–19.4)
Kappa, lambda light chain ratio: 1.5 (ref 0.26–1.65)
Lambda free light chains: 10 mg/L (ref 5.7–26.3)

## 2021-12-04 LAB — ERYTHROPOIETIN: Erythropoietin: 17 m[IU]/mL (ref 2.6–18.5)

## 2021-12-05 LAB — IGG, IGA, IGM
IgA: 50 mg/dL — ABNORMAL LOW (ref 64–422)
IgG (Immunoglobin G), Serum: 271 mg/dL — ABNORMAL LOW (ref 586–1602)
IgM (Immunoglobulin M), Srm: 70 mg/dL (ref 26–217)

## 2021-12-07 LAB — PROTEIN ELECTROPHORESIS, SERUM, WITH REFLEX
A/G Ratio: 1.6 (ref 0.7–1.7)
Albumin ELP: 3.6 g/dL (ref 2.9–4.4)
Alpha-1-Globulin: 0.2 g/dL (ref 0.0–0.4)
Alpha-2-Globulin: 0.8 g/dL (ref 0.4–1.0)
Beta Globulin: 0.8 g/dL (ref 0.7–1.3)
Gamma Globulin: 0.3 g/dL — ABNORMAL LOW (ref 0.4–1.8)
Globulin, Total: 2.2 g/dL (ref 2.2–3.9)
Total Protein ELP: 5.8 g/dL — ABNORMAL LOW (ref 6.0–8.5)

## 2021-12-22 ENCOUNTER — Other Ambulatory Visit: Payer: Self-pay | Admitting: Urology

## 2022-02-03 ENCOUNTER — Inpatient Hospital Stay: Payer: Medicare Other | Attending: Hematology & Oncology

## 2022-02-03 DIAGNOSIS — Z452 Encounter for adjustment and management of vascular access device: Secondary | ICD-10-CM | POA: Diagnosis present

## 2022-02-03 DIAGNOSIS — C880 Waldenstrom macroglobulinemia: Secondary | ICD-10-CM | POA: Insufficient documentation

## 2022-02-03 MED ORDER — SODIUM CHLORIDE 0.9% FLUSH
10.0000 mL | INTRAVENOUS | Status: DC | PRN
  Administered 2022-02-03: 10 mL via INTRAVENOUS

## 2022-02-03 MED ORDER — HEPARIN SOD (PORK) LOCK FLUSH 100 UNIT/ML IV SOLN
500.0000 [IU] | Freq: Once | INTRAVENOUS | Status: AC
  Administered 2022-02-03: 500 [IU] via INTRAVENOUS

## 2022-02-05 ENCOUNTER — Other Ambulatory Visit: Payer: Self-pay | Admitting: Psychiatry

## 2022-02-05 DIAGNOSIS — F330 Major depressive disorder, recurrent, mild: Secondary | ICD-10-CM

## 2022-02-05 DIAGNOSIS — F4001 Agoraphobia with panic disorder: Secondary | ICD-10-CM

## 2022-02-08 ENCOUNTER — Encounter: Payer: Self-pay | Admitting: Psychiatry

## 2022-02-08 ENCOUNTER — Ambulatory Visit (INDEPENDENT_AMBULATORY_CARE_PROVIDER_SITE_OTHER): Payer: Medicare Other | Admitting: Psychiatry

## 2022-02-08 DIAGNOSIS — F4001 Agoraphobia with panic disorder: Secondary | ICD-10-CM

## 2022-02-08 DIAGNOSIS — F5105 Insomnia due to other mental disorder: Secondary | ICD-10-CM

## 2022-02-08 DIAGNOSIS — F330 Major depressive disorder, recurrent, mild: Secondary | ICD-10-CM | POA: Diagnosis not present

## 2022-02-08 MED ORDER — TRAZODONE HCL 150 MG PO TABS
150.0000 mg | ORAL_TABLET | Freq: Every day | ORAL | 1 refills | Status: DC
Start: 2022-02-08 — End: 2022-07-27

## 2022-02-08 MED ORDER — CLONAZEPAM 0.5 MG PO TABS: ORAL_TABLET | ORAL | 1 refills | Status: DC

## 2022-02-08 MED ORDER — DULOXETINE HCL 60 MG PO CPEP: 60.0000 mg | ORAL_CAPSULE | Freq: Every day | ORAL | 1 refills | Status: DC
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
## 2022-02-18 ENCOUNTER — Encounter: Payer: Self-pay | Admitting: Physician Assistant

## 2022-02-18 ENCOUNTER — Ambulatory Visit: Payer: Medicare Other | Admitting: Physician Assistant

## 2022-02-18 VITALS — BP 130/65 | HR 67 | Ht 65.0 in | Wt 133.2 lb

## 2022-02-18 DIAGNOSIS — N3281 Overactive bladder: Secondary | ICD-10-CM | POA: Diagnosis not present

## 2022-02-18 LAB — BLADDER SCAN AMB NON-IMAGING

## 2022-02-18 NOTE — Progress Notes (Signed)
02/18/2022 4:47 PM   Tiffany Leon 12-May-1950 [id number]  CC: Chief Complaint  Patient presents with   Over Active Bladder   HPI: Tiffany Leon is a 71 y.o. female with PMH OAB wet managed with intravesical Botox and oxybutynin, right renal pelvic stone, and UTIs who presents today for follow-up of OAB symptoms after intravesical Botox with Dr. Erlene Quan on 11/18/2021.   Today she reports she has only had 3 "gushes" in the past 3 months, all of which she attributes to stress.  Overall she is thrilled with her progress on intravesical Botox and is no longer wearing depends.  She states that she switched to regular underwear 1 month ago and has been doing well.  She has had some occasional burning with urination, but none currently.  She wishes to continue intravesical Botox.  PVR 47 mL.  PMH: Past Medical History:  Diagnosis Date   Acute kidney injury ([place])    a. 09/2018   Anxiety    Aortic atherosclerosis ([place])    a. 09/2018 noted on Chest CT.   Chronic combined systolic (congestive) and diastolic (congestive) heart failure ([place])    a.) 09/2018 Echo: EF 45-50%, diff HK. Triv to small circumfirential pericardial eff. b.) TTE 02/14/2020: EF 60-65%; no RWMAs; G1DD; GLS -23.3%   Chronic DOE (dyspnea on exertion)    Claudication (HCC)    a. Bilat hip claudication since ~ 2019.   COPD (chronic obstructive pulmonary disease) (HCC)    Coronary artery calcification seen on CT scan 09/20/2018   Depression    Dyspnea    Emphysema lung (HCC)    Exertional angina    a. Ex angina since ~ 2019.   GERD (gastroesophageal reflux disease)    Goals of care, counseling/discussion 11/03/2018   Hiatal hernia    History of 2019 novel coronavirus disease (COVID-19) 01/16/2021   History of bronchitis    History of kidney stones    Hypothyroidism    Pleural effusion    a. 09/2018 s/p thoracentesis-->963m.   Resting tremor    Tobacco abuse    [place]globulinemia ([place] 11/03/2018     Surgical History: Past Surgical History:  Procedure Laterality Date   BOTOX INJECTION N/A 05/25/2021   Procedure: BOTOX INJECTION;  Surgeon: BHollice Espy MD;  Location: [facility] ORS;  Service: Urology;  Laterality: N/A;   BREAST LUMPECTOMY WITH RADIOACTIVE SEED LOCALIZATION Left 12/28/2017   Procedure: BREAST LUMPECTOMY WITH RADIOACTIVE SEED LOCALIZATION;  Surgeon: TJovita Kussmaul MD;  [facility]  Service: General;  Laterality: Left;   BREAST SURGERY     CYSTOSCOPY/URETEROSCOPY/HOLMIUM LASER/STENT PLACEMENT Right 05/25/2021   Procedure: CYSTOSCOPY/URETEROSCOPY/HOLMIUM LASER/STENT PLACEMENT;  Surgeon: BHollice Espy MD;  Location: [facility] ORS;  Service: Urology;  Laterality: Right;   DG THUMB RIGHT HAND ([facility] HX)     IR IMAGING GUIDED PORT INSERTION  11/09/2018   IR THORACENTESIS ASP PLEURAL SPACE W/IMG GUIDE  09/20/2018   KIDNEY STONE SURGERY      Home Medications:  Allergies as of 02/18/2022       Reactions   Codeine Nausea And Vomiting        Medication List        Accurate as of February 18, 2022  4:47 PM. If you have any questions, ask your nurse or doctor.          Acetaminophen Extra Strength 500 MG Caps Take 1,000 mg by mouth every 6 (six) hours as needed for pain.   carvedilol 12.5 MG tablet Commonly known  as: COREG TAKE 1 TABLET (12.5 MG TOTAL) BY MOUTH 2 (TWO) TIMES DAILY WITH A MEAL. What changed: when to take this   clonazePAM 0.5 MG tablet Commonly known as: KLONOPIN 1/2 tablet in AM and 1 tablet in PM and 1/2 tablet prn anxiety   DULoxetine 60 MG capsule Commonly known as: CYMBALTA Take 1 capsule (60 mg total) by mouth daily.   lidocaine-prilocaine cream Commonly known as: EMLA Apply 1 application topically as needed. Apply to [facility] 1 hour before access   oxybutynin 15 MG 24 hr tablet Commonly known as: DITROPAN XL TAKE 1 TABLET (15 MG TOTAL) BY MOUTH DAILY.   traZODone 150 MG tablet Commonly known as: DESYREL Take 1 tablet (150 mg total)  by mouth at bedtime.   Trelegy Ellipta 200-62.5-25 MCG/ACT Aepb Generic drug: Fluticasone-Umeclidin-Vilant Inhale 1 puff into the lungs daily at 2 PM.   triamcinolone ointment 0.1 % Commonly known as: KENALOG Apply 1 application topically daily as needed (eczema).        Allergies:  Allergies  Allergen Reactions   Codeine Nausea And Vomiting    Family History: Family History  Problem Relation Age of Onset   Stroke Mother        Mini-strokes. Died @ 63.   Dementia Mother    Lung cancer Father        Died in his 30's   Addison's disease Sister    Hypertension Brother    CAD Brother    Hypertension Brother     Social History:   reports that she has been smoking cigarettes. She has a 20.00 pack-year smoking history. She has never used smokeless tobacco. She reports that she does not currently use alcohol. She reports that she does not currently use drugs.  Physical Exam: BP 130/65   Pulse 67   Ht '5\' 5"'$  (1.651 m)   Wt 133 lb 3.2 oz (60.4 kg)   BMI 22.17 kg/m   Constitutional:  Alert and oriented, no acute distress, nontoxic appearing HEENT: [place], AT Cardiovascular: No clubbing, cyanosis, or edema Respiratory: Normal respiratory effort, no increased work of breathing Skin: No rashes, bruises or suspicious lesions Neurologic: Grossly intact, no focal deficits, moving all 4 extremities Psychiatric: Normal mood and affect  Laboratory Data: Results for orders placed or performed in visit on 02/18/22  Bladder Scan (Post Void Residual) in office  Result Value Ref Range   Scan Result 34m    Assessment & Plan:   1. OAB (overactive bladder) Significant symptomatic improvement on intravesical Botox.  She is emptying appropriately today and wishes to continue Botox therapy about every 6 months.  She will be due in early February 2024, I have asked my colleague to arrange this and we will contact her to schedule appointments. - Bladder Scan (Post Void Residual) in  office  Return in about 3 months (around 05/21/2022) for repeat Botox, will call to schedule.  SDebroah Loop PA-C  [facility] [address] [place]montBLa Joy[place] [phone]

## 2022-03-05 ENCOUNTER — Encounter (HOSPITAL_BASED_OUTPATIENT_CLINIC_OR_DEPARTMENT_OTHER): Payer: Self-pay | Admitting: Pulmonary Disease

## 2022-03-05 ENCOUNTER — Other Ambulatory Visit: Payer: Self-pay | Admitting: Psychiatry

## 2022-03-05 ENCOUNTER — Ambulatory Visit (HOSPITAL_BASED_OUTPATIENT_CLINIC_OR_DEPARTMENT_OTHER): Payer: Medicare Other | Admitting: Pulmonary Disease

## 2022-03-05 VITALS — BP 120/70 | HR 81 | Ht 65.0 in | Wt 134.4 lb

## 2022-03-05 DIAGNOSIS — J432 Centrilobular emphysema: Secondary | ICD-10-CM

## 2022-03-05 DIAGNOSIS — F5105 Insomnia due to other mental disorder: Secondary | ICD-10-CM

## 2022-03-05 DIAGNOSIS — J42 Unspecified chronic bronchitis: Secondary | ICD-10-CM | POA: Diagnosis not present

## 2022-03-05 MED ORDER — TRELEGY ELLIPTA 200-62.5-25 MCG/ACT IN AEPB: 1.0000 | INHALATION_SPRAY | Freq: Every day | RESPIRATORY_TRACT | 5 refills | Status: DC

## 2022-03-05 NOTE — Patient Instructions (Signed)
Emphysema - improved but remains symptomatic, not in exacerbation --CONTINUE Trelegy 200 ONE puff ONCE a day --Encourage regular aerobic exercise with walking and stationary bike every other day --Consider pulmonary rehab in the future  Tobacco abuse Patient is an active smoker. We discussed smoking cessation for 3 minutes. We discussed triggers and stressors and ways to deal with them. We discussed barriers to continued smoking and benefits of smoking cessation. Provided patient with information cessation techniques and interventions including [place] quitline.  Follow-up with me in 6 months

## 2022-03-05 NOTE — Progress Notes (Signed)
Subjective:   PATIENT ID: Tiffany Leon GENDER: female DOB: [birth year], MRN: [id number]   HPI  Chief Complaint  Patient presents with   Follow-up    Feeling better     Reason for Visit: Follow-up  [name] is a 71 year old female former smoker with COPD, chronic systolic heart failure, hypertension, CKD stage III Waldenstrom's macroglobulinemia s/p cytoxan/rituxan (ended 10/2020), hypothyroidism who presents for follow-up.  Initial consult She was previously hospitalized in June 2020 for chest pain and shortness of breath.  Chest imaging at that time showed aortic atherosclerosis, CAD and bilateral pleural effusions.  Echo with EF 40 to 50% which normalized as an outpatient. She reports shortness of breath that has gradually worsened in the last three years. Attributes to this when she began having anemia around that time and decreased activity. Daily cough with clear sputum daily. Never wheezing. Walking will tire her out and will need breaks. Going to grocery is strenuous and needs pauses with carrying groceries. She had COVID in September 2022. Has completed chemo in July 2022. She is not currently on any inhalers but has used them when she was on COVID.   09/17/21 Since her last visit she was started on Stiolto she reports partial benefit. No nocturnal symptoms. Still feels fatigued. Limited by shortness of breath. Coughs daily which is related to her esophagus. On Prilosec and planning for dilation this summer. She is currently smoking 3/4 ppd. Has previously quit for 6 months in the past but restarted when her husband restarted. Has not exercised routinely but has a stationary bike that is available at home.  03/05/22 Since her last visit she was stepped up to Trelegy. Reports she has more stamina. She is having less shortness of breath and mucous. No wheezing. Currently smoking 1ppd. She plans to cut down gradually  Social History: Active smoker. 20 pack years. Currently  smoking 1/2 ppd   Past Medical History:  Diagnosis Date   Acute kidney injury ([place])    a. 09/2018   Anxiety    Aortic atherosclerosis ([place])    a. 09/2018 noted on Chest CT.   Chronic combined systolic (congestive) and diastolic (congestive) heart failure ([place])    a.) 09/2018 Echo: EF 45-50%, diff HK. Triv to small circumfirential pericardial eff. b.) TTE 02/14/2020: EF 60-65%; no RWMAs; G1DD; GLS -23.3%   Chronic DOE (dyspnea on exertion)    Claudication (HCC)    a. Bilat hip claudication since ~ 2019.   COPD (chronic obstructive pulmonary disease) (HCC)    Coronary artery calcification seen on CT scan 09/20/2018   Depression    Dyspnea    Emphysema lung (HCC)    Exertional angina    a. Ex angina since ~ 2019.   GERD (gastroesophageal reflux disease)    Goals of care, counseling/discussion 11/03/2018   Hiatal hernia    History of 2019 novel coronavirus disease (COVID-19) 01/16/2021   History of bronchitis    History of kidney stones    Hypothyroidism    Pleural effusion    a. 09/2018 s/p thoracentesis-->91m.   Resting tremor    Tobacco abuse    [place] 11/03/[place]     Family History  Problem Relation Age of Onset   Stroke Mother        Mini-strokes. Died @ 668   Dementia Mother    Lung cancer Father        Died in his 746's  Addison's disease Sister    Hypertension  Brother    CAD Brother    Hypertension Brother      Social History   Occupational History   Not on file  Tobacco Use   Smoking status: Every Day    Packs/day: 0.50    Years: 40.00    Total pack years: 20.00    Types: Cigarettes   Smokeless tobacco: Never  Vaping Use   Vaping Use: Never used  Substance and Sexual Activity   Alcohol use: Not Currently    Comment: 1-2 gl wine / night - none since 07/2018   Drug use: Not Currently    Comment: prev used drugs ~ 35 yrs ago.   Sexual activity: Not on file    Allergies  Allergen Reactions   Codeine Nausea And  Vomiting     Outpatient Medications Prior to Visit  Medication Sig Dispense Refill   Acetaminophen (ACETAMINOPHEN EXTRA STRENGTH) 500 MG capsule Take 1,000 mg by mouth every 6 (six) hours as needed for pain.     carvedilol (COREG) 12.5 MG tablet TAKE 1 TABLET (12.5 MG TOTAL) BY MOUTH 2 (TWO) TIMES DAILY WITH A MEAL. (Patient taking differently: Take 12.5 mg by mouth in the morning.) 180 tablet 3   clonazePAM (KLONOPIN) 0.5 MG tablet 1/2 tablet in AM and 1 tablet in PM and 1/2 tablet prn anxiety 180 tablet 1   DULoxetine (CYMBALTA) 60 MG capsule Take 1 capsule (60 mg total) by mouth daily. 90 capsule 1   Fluticasone-Umeclidin-Vilant (TRELEGY ELLIPTA) 200-62.5-25 MCG/ACT AEPB Inhale 1 puff into the lungs daily at 2 PM. 60 each 5   lidocaine-prilocaine (EMLA) cream Apply 1 application topically as needed. Apply to [facility] 1 hour before access 60 g 3   oxybutynin (DITROPAN XL) 15 MG 24 hr tablet TAKE 1 TABLET (15 MG TOTAL) BY MOUTH DAILY. 30 tablet 1   traZODone (DESYREL) 150 MG tablet Take 1 tablet (150 mg total) by mouth at bedtime. 90 tablet 1   triamcinolone ointment (KENALOG) 0.1 % Apply 1 application topically daily as needed (eczema).     Facility-Administered Medications Prior to Visit  Medication Dose Route Frequency Provider Last Rate Last Admin   sodium chloride flush (NS) 0.9 % injection 10 mL  10 mL Intravenous PRN [name], MD   10 mL at 02/03/21 1210    Review of Systems  Constitutional:  Negative for chills, diaphoresis, fever, malaise/fatigue and weight loss.  HENT:  Negative for congestion.   Respiratory:  Positive for sputum production and shortness of breath. Negative for cough, hemoptysis and wheezing.   Cardiovascular:  Negative for chest pain, palpitations and leg swelling.     Objective:   Vitals:   03/05/22 0833  Pulse: 81  SpO2: 97%  Weight: 134 lb 6.4 oz (61 kg)  Height: '5\' 5"'$  (1.651 m)   SpO2: 97 %  Physical Exam: General: Well-appearing, no acute  distress HENT: , AT Eyes: EOMI, no scleral icterus Respiratory: Clear to auscultation bilaterally.  No crackles, wheezing or rales Cardiovascular: RRR, -M/R/G, no JVD Extremities:-Edema,-tenderness Neuro: AAO x4, CNII-XII grossly intact Psych: Normal mood, normal affect  Data Reviewed:  Imaging: CT chest 09/20/2018-bilateral pleural effusions right >left.  Small pericardial effusion CT chest 10/18/2018-centrilobular emphysema.  Right lower lobe atelectasis with right pleural effusion CXR 01/16/2021- Right PAC, hyperinflation.  No infiltrate, effusion or edema.  PFT: 09/14/2013  FVC 2.96 (90%) FEV1 2.10 (83%) ratio 72 TLC 116% DLCO 72% Interpretation: Based on ATS guidelines, mild obstructive defect mildly reduced DLCO.  No significant bronchodilator response.  F-V consistent with obstructive defect possible emphysema however absence of overinflation is inconsistent with this.  Clinically correlate  Labs: CBC    Component Value Date/Time   WBC 5.1 12/03/2021 0818   WBC 3.9 (L) 05/15/2021 1047   RBC 3.39 (L) 12/03/2021 0818   HGB 11.7 (L) 12/03/2021 0818   HCT 35.0 (L) 12/03/2021 0818   PLT 218 12/03/2021 0818   MCV 103.2 (H) 12/03/2021 0818   MCH 34.5 (H) 12/03/2021 0818   MCHC 33.4 12/03/2021 0818   RDW 13.5 12/03/2021 0818   LYMPHSABS 1.2 12/03/2021 0818   MONOABS 0.3 12/03/2021 0818   EOSABS 0.0 12/03/2021 0818   BASOSABS 0.0 12/03/2021 0818   Absolute eosinophils 06/05/2021-100     Assessment & Plan:   Discussion: 70 year old active smoker with COPD, HTN, CKD stage III, Waldenstrom's macroglobulinemia s/p cytoxan/rituxan, hypothyroidism who presents for COPD follow-up. Improved symptoms on triple therapy. Discussed clinical course and management of COPD/asthma including bronchodilator regimen and action plan for exacerbation.  Emphysema - improved but remains symptomatic, not in exacerbation --CONTINUE Trelegy 200 ONE puff ONCE a day --Encourage regular aerobic  exercise with walking and stationary bike every other day --Consider pulmonary rehab in the future --Provided handicap  Tobacco abuse Patient is an active smoker. We discussed smoking cessation for 3 minutes. We discussed triggers and stressors and ways to deal with them. We discussed barriers to continued smoking and benefits of smoking cessation. Provided patient with information cessation techniques and interventions including [place] quitline.   Health Maintenance Immunization History  Administered Date(s) Administered   Influenza, High Dose Seasonal PF 02/27/2019   Moderna Sars-Covid-2 Vaccination 02/28/2020   PFIZER(Purple Top)SARS-COV-2 Vaccination 06/11/2019, 07/02/2019, 02/28/2020   Pneumococcal Conjugate-13 11/02/2017   Tdap 05/02/2012   CT Lung Screen - qualified. Discuss at next visit  No orders of the defined types were placed in this encounter.  Meds ordered this encounter  Medications   Fluticasone-Umeclidin-Vilant (TRELEGY ELLIPTA) 200-62.5-25 MCG/ACT AEPB    Sig: Inhale 1 puff into the lungs daily at 2 PM.    Dispense:  60 each    Refill:  5    Return in about 6 months (around 09/03/2022).  I have spent a total time of 31-minutes on the day of the appointment including chart review, data review, collecting history, coordinating care and discussing medical diagnosis and plan with the patient/family. Past medical history, allergies, medications were reviewed. Pertinent imaging, labs and tests included in this note have been reviewed and interpreted independently by me.  [place], MD [facility] 03/05/2022 8:40 AM  Office Number [phone]

## 2022-03-13 ENCOUNTER — Encounter (HOSPITAL_BASED_OUTPATIENT_CLINIC_OR_DEPARTMENT_OTHER): Payer: Self-pay | Admitting: Pulmonary Disease

## 2022-04-05 ENCOUNTER — Inpatient Hospital Stay: Payer: Medicare Other

## 2022-04-09 ENCOUNTER — Inpatient Hospital Stay: Payer: Medicare Other | Attending: Hematology & Oncology

## 2022-04-09 DIAGNOSIS — C880 Waldenstrom macroglobulinemia: Secondary | ICD-10-CM | POA: Insufficient documentation

## 2022-04-09 DIAGNOSIS — Z452 Encounter for adjustment and management of vascular access device: Secondary | ICD-10-CM | POA: Diagnosis present

## 2022-04-09 NOTE — Patient Instructions (Signed)

## 2022-05-18 ENCOUNTER — Telehealth: Payer: Self-pay | Admitting: Urology

## 2022-05-18 DIAGNOSIS — N3281 Overactive bladder: Secondary | ICD-10-CM

## 2022-05-18 NOTE — Telephone Encounter (Signed)
Pt is ready to have a Botox injection.  Needs appt the fist of February. Insurance info is the same.  Vanita Ingles [phone]

## 2022-05-20 NOTE — Addendum Note (Signed)
Addended by: Despina Hidden on: 05/20/2022 02:48 PM   Modules accepted: Orders

## 2022-05-20 NOTE — Telephone Encounter (Signed)
Benefits verification results scanned into chart  Per [facility] provider portal: Notification or Prior Authorization is not required for the requested services You are not required to submit a notification/prior authorization based on the information provided. If you have general questions about the prior authorization requirements, visit [email or web address] > Clinician Resources > Advance and Admission Notification Requirements. The number above acknowledges your notification. Please write this reference number down for future reference. If you would like to request an organization determination, please call us at [phone]. Decision ID #: [id number] The number above acknowledges your inquiry and our response. Please write this number down and refer to it for future inquiries. Coverage and payment for an item or service is governed by the member's benefit plan document, and, if applicable, the provider's participation agreement with the Health Plan.

## 2022-05-20 NOTE — Telephone Encounter (Signed)
Spoke with patient and scheduled lab appt for UA for Botox, once urine is clear can schedule botox.   Botox labeled in refrigerator.

## 2022-05-25 ENCOUNTER — Other Ambulatory Visit: Payer: Medicare Other

## 2022-05-25 DIAGNOSIS — N3281 Overactive bladder: Secondary | ICD-10-CM

## 2022-05-25 LAB — URINALYSIS, COMPLETE
Bilirubin, UA: NEGATIVE
Glucose, UA: NEGATIVE
Ketones, UA: NEGATIVE
Leukocytes,UA: NEGATIVE
Nitrite, UA: NEGATIVE
RBC, UA: NEGATIVE
Specific Gravity, UA: >1.03 — ABNORMAL HIGH (ref 1.005–1.030)
Urobilinogen, Ur: 0.2 mg/dL (ref 0.2–1.0)
pH, UA: 5.5 (ref 5.0–7.5)

## 2022-05-25 LAB — MICROSCOPIC EXAMINATION

## 2022-05-29 LAB — CULTURE, URINE COMPREHENSIVE

## 2022-06-02 ENCOUNTER — Inpatient Hospital Stay: Payer: Medicare Other

## 2022-06-02 ENCOUNTER — Inpatient Hospital Stay: Payer: Medicare Other | Admitting: Hematology & Oncology

## 2022-06-02 ENCOUNTER — Other Ambulatory Visit: Payer: Self-pay

## 2022-06-02 ENCOUNTER — Encounter: Payer: Self-pay | Admitting: Hematology & Oncology

## 2022-06-02 ENCOUNTER — Inpatient Hospital Stay: Payer: Medicare Other | Attending: Hematology & Oncology

## 2022-06-02 VITALS — BP 126/80 | HR 62 | Temp 97.7°F | Resp 18 | Ht 65.0 in | Wt 134.0 lb

## 2022-06-02 DIAGNOSIS — C880 Waldenstrom macroglobulinemia: Secondary | ICD-10-CM | POA: Diagnosis not present

## 2022-06-02 DIAGNOSIS — F17210 Nicotine dependence, cigarettes, uncomplicated: Secondary | ICD-10-CM | POA: Diagnosis not present

## 2022-06-02 DIAGNOSIS — D649 Anemia, unspecified: Secondary | ICD-10-CM | POA: Diagnosis not present

## 2022-06-02 DIAGNOSIS — N189 Chronic kidney disease, unspecified: Secondary | ICD-10-CM | POA: Diagnosis not present

## 2022-06-02 DIAGNOSIS — Z95828 Presence of other vascular implants and grafts: Secondary | ICD-10-CM

## 2022-06-02 LAB — CBC WITH DIFFERENTIAL (CANCER CENTER ONLY)
Abs Immature Granulocytes: 0.19 10*3/uL — ABNORMAL HIGH (ref 0.00–0.07)
Basophils Absolute: 0 10*3/uL (ref 0.0–0.1)
Basophils Relative: 1 %
Eosinophils Absolute: 0.1 10*3/uL (ref 0.0–0.5)
Eosinophils Relative: 2 %
HCT: 36.7 % (ref 36.0–46.0)
Hemoglobin: 12.1 g/dL (ref 12.0–15.0)
Immature Granulocytes: 5 %
Lymphocytes Relative: 30 %
Lymphs Abs: 1.1 10*3/uL (ref 0.7–4.0)
MCH: 33.6 pg (ref 26.0–34.0)
MCHC: 33 g/dL (ref 30.0–36.0)
MCV: 101.9 fL — ABNORMAL HIGH (ref 80.0–100.0)
Monocytes Absolute: 0.3 10*3/uL (ref 0.1–1.0)
Monocytes Relative: 9 %
Neutro Abs: 2 10*3/uL (ref 1.7–7.7)
Neutrophils Relative %: 53 %
Platelet Count: 230 10*3/uL (ref 150–400)
RBC: 3.6 MIL/uL — ABNORMAL LOW (ref 3.87–5.11)
RDW: 13.5 % (ref 11.5–15.5)
WBC Count: 3.7 10*3/uL — ABNORMAL LOW (ref 4.0–10.5)
nRBC: 0 % (ref 0.0–0.2)

## 2022-06-02 LAB — CMP (CANCER CENTER ONLY)
ALT: 12 U/L (ref 0–44)
AST: 17 U/L (ref 15–41)
Albumin: 4.6 g/dL (ref 3.5–5.0)
Alkaline Phosphatase: 57 U/L (ref 38–126)
Anion gap: 7 (ref 5–15)
BUN: 20 mg/dL (ref 8–23)
CO2: 27 mmol/L (ref 22–32)
Calcium: 9.4 mg/dL (ref 8.9–10.3)
Chloride: 103 mmol/L (ref 98–111)
Creatinine: 0.98 mg/dL (ref 0.44–1.00)
GFR, Estimated: >60 mL/min (ref >60)
Glucose, Bld: 102 mg/dL — ABNORMAL HIGH (ref 70–99)
Potassium: 3.9 mmol/L (ref 3.5–5.1)
Sodium: 137 mmol/L (ref 135–145)
Total Bilirubin: 0.5 mg/dL (ref 0.3–1.2)
Total Protein: 6.6 g/dL (ref 6.5–8.1)

## 2022-06-02 LAB — LACTATE DEHYDROGENASE: LDH: 152 U/L (ref 98–192)

## 2022-06-02 MED ORDER — SODIUM CHLORIDE 0.9% FLUSH
10.0000 mL | Freq: Once | INTRAVENOUS | Status: AC
  Administered 2022-06-02: 10 mL via INTRAVENOUS

## 2022-06-02 MED ORDER — HEPARIN SOD (PORK) LOCK FLUSH 100 UNIT/ML IV SOLN
500.0000 [IU] | Freq: Once | INTRAVENOUS | Status: AC
  Administered 2022-06-02: 500 [IU] via INTRAVENOUS

## 2022-06-02 NOTE — Progress Notes (Signed)
Hematology and Oncology Follow Up Visit  Tiffany Leon [id number] 07/13/1950 72 y.o. 06/02/2022   Principle Diagnosis:  Waldenstrom's macroglobulinemia Anemia, chronic renal insufficiency, probable rheumatoid arthritis  Current Therapy:   Rituxan/Cytoxan-- start cycle #1 on 11/10/2018 -- cytoxan d/c'ed on 12/01/2018 Rituxan 359m/m2 IV q 2 months -- maintanence - completed on 10/2020 Retacrit 40,000 units sq for Hgb < 11   Interim History:  Ms. KKottwitzis here today for follow-up.  We see her every 6 months.  Unfortunately, her brother passed away about a month and a half ago from brain cancer.  I am sorry to hear this.  [place]  [place] go down there every year.  As far as the Waldenstrom's is concerned, she is doing well with this.  When we last saw her back in August, her M spike was not found.  Her IgM level was 70 mg/dL.   She is still smoking.  Smokes 1 pack/day.  I talked her about this.  Hopefully, she will be able to cut back gradually.  She is having no problems going to the bathroom.  I know that she does have an overactive bladder.  She had been getting Botox injections.    Thankfully, there is been no problems with COVID.  Overall, I would have to say that her performance status is probably ECOG 1.   Medications:  Allergies as of 06/02/2022       Reactions   Codeine Nausea And Vomiting        Medication List        Accurate as of June 02, 2022  9:30 AM. If you have any questions, ask your nurse or doctor.          STOP taking these medications    oxybutynin 15 MG 24 hr tablet Commonly known as: DITROPAN XL Stopped by: PVolanda Napoleon Leon       TAKE these medications    Acetaminophen Extra Strength 500 MG Caps Take 1,000 mg by mouth every 6 (six) hours as needed for pain.   carvedilol 12.5 MG tablet Commonly known as: COREG TAKE 1 TABLET (12.5 MG TOTAL) BY MOUTH 2 (TWO) TIMES DAILY WITH A MEAL. What  changed: when to take this   clonazePAM 0.5 MG tablet Commonly known as: KLONOPIN 1/2 tablet in AM and 1 tablet in PM and 1/2 tablet prn anxiety   DULoxetine 60 MG capsule Commonly known as: CYMBALTA Take 1 capsule (60 mg total) by mouth daily.   lidocaine-prilocaine cream Commonly known as: EMLA Apply 1 application topically as needed. Apply to [facility] hour before access   traZODone 150 MG tablet Commonly known as: DESYREL Take 1 tablet (150 mg total) by mouth at bedtime.   Trelegy Ellipta 200-62.5-25 MCG/ACT Aepb Generic drug: Fluticasone-Umeclidin-Vilant Inhale 1 puff into the lungs daily at 2 PM.   triamcinolone ointment 0.1 % Commonly known as: KENALOG Apply 1 application topically daily as needed (eczema).        Allergies:  Allergies  Allergen Reactions   Codeine Nausea And Vomiting    Past Medical History, Surgical history, Social history, and Family History were reviewed and updated.  Review of Systems: Review of Systems  Constitutional: Negative.   HENT: Negative.    Eyes: Negative.   Respiratory: Negative.    Cardiovascular: Negative.   Gastrointestinal: Negative.   Genitourinary: Negative.   Musculoskeletal:  Positive for joint pain.  Skin: Negative.   Neurological: Negative.  Endo/Heme/Allergies: Negative.   Psychiatric/Behavioral: Negative.       Physical Exam:  height is 5' 5"$  (1.651 m) and weight is 134 lb (60.8 kg). Her oral temperature is 97.7 F (36.5 C). Her blood pressure is 126/80 and her pulse is 62. Her respiration is 18 and oxygen saturation is 99%.   Wt Readings from Last 3 Encounters:  06/02/22 134 lb (60.8 kg)  03/05/22 134 lb 6.4 oz (61 kg)  02/18/22 133 lb 3.2 oz (60.4 kg)    Physical Exam Vitals reviewed.  HENT:     Head: Normocephalic and atraumatic.  Eyes:     Pupils: Pupils are equal, round, and reactive to light.  Cardiovascular:     Rate and Rhythm: Normal rate and regular rhythm.     Heart sounds: Normal  heart sounds.  Pulmonary:     Effort: Pulmonary effort is normal.     Breath sounds: Normal breath sounds.  Abdominal:     General: Bowel sounds are normal.     Palpations: Abdomen is soft.  Musculoskeletal:        General: No tenderness or deformity. Normal range of motion.     Cervical back: Normal range of motion.  Lymphadenopathy:     Cervical: No cervical adenopathy.  Skin:    General: Skin is warm and dry.     Findings: No erythema or rash.  Neurological:     Mental Status: She is alert and oriented to person, place, and time.  Psychiatric:        Behavior: Behavior normal.        Thought Content: Thought content normal.        Judgment: Judgment normal.      Lab Results  Component Value Date   WBC 3.7 (L) 06/02/2022   HGB 12.1 06/02/2022   HCT 36.7 06/02/2022   MCV 101.9 (H) 06/02/2022   PLT 230 06/02/2022   Lab Results  Component Value Date   FERRITIN 155 12/03/2021   IRON 98 12/03/2021   TIBC 283 12/03/2021   UIBC 185 12/03/2021   IRONPCTSAT 35 (H) 12/03/2021   Lab Results  Component Value Date   RETICCTPCT 2.2 12/25/2019   RBC 3.60 (L) 06/02/2022   Lab Results  Component Value Date   KPAFRELGTCHN 15.0 12/03/2021   LAMBDASER 10.0 12/03/2021   KAPLAMBRATIO 1.50 12/03/2021   Lab Results  Component Value Date   IGGSERUM 271 (L) 12/03/2021   IGA 50 (L) 12/03/2021   IGMSERUM 70 12/03/2021   Lab Results  Component Value Date   TOTALPROTELP 5.8 (L) 12/03/2021   ALBUMINELP 3.6 12/03/2021   A1GS 0.2 12/03/2021   A2GS 0.8 12/03/2021   BETS 0.8 12/03/2021   GAMS 0.3 (L) 12/03/2021   MSPIKE Not Observed 12/03/2021   SPEI Comment 12/25/2019     Chemistry      Component Value Date/Time   NA 137 06/02/2022 0835   K 3.9 06/02/2022 0835   CL 103 06/02/2022 0835   CO2 27 06/02/2022 0835   BUN 20 06/02/2022 0835   CREATININE 0.98 06/02/2022 0835      Component Value Date/Time   CALCIUM 9.4 06/02/2022 0835   ALKPHOS 57 06/02/2022 0835   AST 17  06/02/2022 0835   ALT 12 06/02/2022 0835   BILITOT 0.5 06/02/2022 0835       Impression and Plan: Tiffany Leon is a very pleasant 72 yo caucasian female with Waldenstrm's macroglobulinemia.  She has responded very well to Rituxan.  We  initially had her on Rituxan with Cytoxan but she could not  tolerate the Cytoxan with her blood counts going down.  She did well with the maintenance Rituxan.  So far, she has been in remission.  Last Rituxan that she got was back in July 2022.  I am just happy that everything is looking good.  Hopefully, she will cut back on smoking.  Right now, we will still plan to get her back in 6 months.   Tiffany Napoleon, Leon 2/14/20249:30 AM

## 2022-06-02 NOTE — Patient Instructions (Signed)

## 2022-06-03 LAB — KAPPA/LAMBDA LIGHT CHAINS
Kappa free light chain: 11.8 mg/L (ref 3.3–19.4)
Kappa, lambda light chain ratio: 1.09 (ref 0.26–1.65)
Lambda free light chains: 10.8 mg/L (ref 5.7–26.3)

## 2022-06-03 LAB — IGG, IGA, IGM
IgA: 61 mg/dL — ABNORMAL LOW (ref 64–422)
IgG (Immunoglobin G), Serum: 308 mg/dL — ABNORMAL LOW (ref 586–1602)
IgM (Immunoglobulin M), Srm: 75 mg/dL (ref 26–217)

## 2022-06-07 LAB — PROTEIN ELECTROPHORESIS, SERUM, WITH REFLEX
A/G Ratio: 1.9 — ABNORMAL HIGH (ref 0.7–1.7)
Albumin ELP: 3.9 g/dL (ref 2.9–4.4)
Alpha-1-Globulin: 0.3 g/dL (ref 0.0–0.4)
Alpha-2-Globulin: 0.7 g/dL (ref 0.4–1.0)
Beta Globulin: 0.8 g/dL (ref 0.7–1.3)
Gamma Globulin: 0.3 g/dL — ABNORMAL LOW (ref 0.4–1.8)
Globulin, Total: 2.1 g/dL — ABNORMAL LOW (ref 2.2–3.9)
Total Protein ELP: 6 g/dL (ref 6.0–8.5)

## 2022-06-17 ENCOUNTER — Ambulatory Visit: Payer: Medicare Other | Admitting: Urology

## 2022-06-17 ENCOUNTER — Encounter: Payer: Self-pay | Admitting: Urology

## 2022-06-17 VITALS — BP 129/90 | HR 66 | Ht 65.0 in | Wt 134.0 lb

## 2022-06-17 DIAGNOSIS — N3281 Overactive bladder: Secondary | ICD-10-CM | POA: Diagnosis not present

## 2022-06-17 MED ORDER — ONABOTULINUMTOXINA 100 UNITS IJ SOLR
100.0000 [IU] | Freq: Once | INTRAMUSCULAR | Status: AC
  Administered 2022-06-17: 100 [IU] via INTRAMUSCULAR

## 2022-06-17 MED ORDER — CEPHALEXIN 250 MG PO CAPS
500.0000 mg | ORAL_CAPSULE | Freq: Once | ORAL | Status: AC
  Administered 2022-06-17: 500 mg via ORAL

## 2022-06-17 NOTE — Addendum Note (Signed)
Addended by: Kris Mouton on: 06/17/2022 10:39 AM   Modules accepted: Orders

## 2022-06-17 NOTE — Progress Notes (Signed)
   CC:  Chief Complaint  Patient presents with   Botulinum Toxin Injection    HPI:  Tiffany Leon is a 72 y.o. female with a personal history of OAB on Botox and oxybutynin, right renal pelvic stone, and UTIs,  who presents today for an office Botox injection.   She has a past medical history of Waldenstrom macroglobulinemia, COPD, stage III chronic kidney disease, kidney stones, and congestive heart failure.   She is noted to have had previous procedures for a 6 mm stone in the right renal pelvis without hydronephrosis visualized on CT scan in 2020.    KUB on 05/06/2021 revealed right nephrolithiasis measuring up to approximately 1.3 cm.   She is s/p cytoscopy with intravesical Botox injection and right ureteroscopy on 05/25/2021. Intraoperative findings: Fairly unremarkable bladder.  Botox injected without difficulty.  Uncomplicated right ureteroscopy, stent left on tether.She removed her own stent.    RUS on 06/19/2021 visualized bilateral renal calculi measuring up to 1.5 cm.   Preoperative UA/urine culture was obtained.    Overall, she has been doing extremely well on this treatment.  She reports NO accidents and is thrilled.  Patient was administered the appropriate periprocedural antibiotics if indicated.  Lidocaine was allowed to dwell in the bladder for 30 minutes prior to the procedure, see CMA note.  Consent was confirmed.  All questions answered.  Timeout was performed.  Vitals:   06/17/22 0917  BP: (!) 129/90  Pulse: 66  NED. A&Ox3.   No respiratory distress   Abd soft, NT, ND Normal external genitalia with patent urethral meatus  Cystoscopy Procedure Note   Patient identification was confirmed, informed consent was obtained, and patient was prepped using Betadine solution.  Lidocaine jelly was administered per urethral meatus.     Procedure: - Flexible cystoscope introduced, without any difficulty.   - Thorough search of the bladder revealed:    normal urethral  meatus    normal urothelium    no stones    no ulcers     no tumors    no urethral polyps    no trabeculation   - Ureteral orifices were normal in position and appearance.   A Botox injection needle was used to inject a total of 100 units which was reconstituted in a total of 10 cc of saline.  A total of 2 rows of 5 injections (10 mL) was injected into the muscularis of the bladder.  This was well-tolerated.  There is slight oozing from a few of the injection sites but no diffuse bleeding.   Post-Procedure: - Patient tolerated the procedure well   Return in 6 mo for next botox  Hollice Espy, MD

## 2022-06-17 NOTE — Addendum Note (Signed)
Addended by: Kris Mouton on: 06/17/2022 10:35 AM   Modules accepted: Orders

## 2022-06-25 ENCOUNTER — Other Ambulatory Visit: Payer: Self-pay | Admitting: Internal Medicine

## 2022-07-27 ENCOUNTER — Encounter: Payer: Self-pay | Admitting: Psychiatry

## 2022-07-27 ENCOUNTER — Ambulatory Visit: Payer: Medicare Other | Admitting: Psychiatry

## 2022-07-27 DIAGNOSIS — F330 Major depressive disorder, recurrent, mild: Secondary | ICD-10-CM | POA: Diagnosis not present

## 2022-07-27 DIAGNOSIS — F4001 Agoraphobia with panic disorder: Secondary | ICD-10-CM

## 2022-07-27 DIAGNOSIS — F5105 Insomnia due to other mental disorder: Secondary | ICD-10-CM | POA: Diagnosis not present

## 2022-07-27 MED ORDER — TRAZODONE HCL 150 MG PO TABS: 150.0000 mg | ORAL_TABLET | Freq: Every day | ORAL | 3 refills | Status: DC

## 2022-07-27 MED ORDER — DULOXETINE HCL 60 MG PO CPEP: 60.0000 mg | ORAL_CAPSULE | Freq: Every day | ORAL | 1 refills | Status: DC

## 2022-07-27 MED ORDER — CLONAZEPAM 0.5 MG PO TABS: ORAL_TABLET | ORAL | 1 refills | Status: DC
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
## 2022-08-02 ENCOUNTER — Inpatient Hospital Stay: Payer: Medicare Other

## 2022-08-18 ENCOUNTER — Inpatient Hospital Stay: Payer: Medicare Other | Attending: Hematology & Oncology

## 2022-08-18 VITALS — BP 132/63 | HR 64 | Temp 97.8°F | Resp 19

## 2022-08-18 DIAGNOSIS — Z452 Encounter for adjustment and management of vascular access device: Secondary | ICD-10-CM | POA: Diagnosis present

## 2022-08-18 DIAGNOSIS — C880 Waldenstrom macroglobulinemia: Secondary | ICD-10-CM | POA: Diagnosis present

## 2022-08-18 DIAGNOSIS — Z95828 Presence of other vascular implants and grafts: Secondary | ICD-10-CM

## 2022-08-18 MED ORDER — HEPARIN SOD (PORK) LOCK FLUSH 100 UNIT/ML IV SOLN
500.0000 [IU] | Freq: Once | INTRAVENOUS | Status: AC
  Administered 2022-08-18: 500 [IU] via INTRAVENOUS

## 2022-08-18 MED ORDER — SODIUM CHLORIDE 0.9% FLUSH
10.0000 mL | Freq: Once | INTRAVENOUS | Status: AC
  Administered 2022-08-18: 10 mL via INTRAVENOUS

## 2022-08-18 NOTE — Patient Instructions (Signed)

## 2022-09-01 ENCOUNTER — Ambulatory Visit (HOSPITAL_BASED_OUTPATIENT_CLINIC_OR_DEPARTMENT_OTHER): Payer: Medicare Other | Admitting: Pulmonary Disease

## 2022-09-08 ENCOUNTER — Ambulatory Visit (HOSPITAL_BASED_OUTPATIENT_CLINIC_OR_DEPARTMENT_OTHER): Payer: Medicare Other | Admitting: Pulmonary Disease

## 2022-09-08 ENCOUNTER — Encounter (HOSPITAL_BASED_OUTPATIENT_CLINIC_OR_DEPARTMENT_OTHER): Payer: Self-pay | Admitting: Pulmonary Disease

## 2022-09-08 VITALS — BP 128/80 | HR 76 | Temp 98.6°F | Ht 66.0 in | Wt 139.6 lb

## 2022-09-08 DIAGNOSIS — F17210 Nicotine dependence, cigarettes, uncomplicated: Secondary | ICD-10-CM

## 2022-09-08 DIAGNOSIS — J432 Centrilobular emphysema: Secondary | ICD-10-CM | POA: Diagnosis not present

## 2022-09-08 MED ORDER — TRELEGY ELLIPTA 200-62.5-25 MCG/ACT IN AEPB: 1.0000 | INHALATION_SPRAY | Freq: Every day | RESPIRATORY_TRACT | 5 refills | Status: DC

## 2022-09-08 NOTE — Patient Instructions (Signed)
  Emphysema - improved but remains symptomatic, not in exacerbation --CONTINUE Trelegy 200 ONE puff ONCE a day. REFILL --Encourage regular aerobic exercise with walking and stationary bike every other day --Provided handicap  Tobacco abuse Patient is an active smoker. We discussed smoking cessation for 5 minutes. We discussed triggers and stressors and ways to deal with them. We discussed barriers to continued smoking and benefits of smoking cessation. Provided patient with information cessation techniques and interventions including [place] quitline.

## 2022-09-08 NOTE — Progress Notes (Signed)
Subjective:   PATIENT ID: Tiffany Leon GENDER: female DOB: 1950-09-04, MRN: [id number]   HPI  Chief Complaint  Patient presents with   Follow-up    Follow up. Patient has no complaints.     Reason for Visit: Follow-up  [name] is a 72 year old female former smoker with COPD, chronic systolic heart failure, hypertension, CKD stage III Waldenstrom's macroglobulinemia s/p cytoxan/rituxan (ended 10/2020), hypothyroidism who presents for follow-up.  Initial consult She was previously hospitalized in June 2020 for chest pain and shortness of breath.  Chest imaging at that time showed aortic atherosclerosis, CAD and bilateral pleural effusions.  Echo with EF 40 to 50% which normalized as an outpatient. She reports shortness of breath that has gradually worsened in the last three years. Attributes to this when she began having anemia around that time and decreased activity. Daily cough with clear sputum daily. Never wheezing. Walking will tire her out and will need breaks. Going to grocery is strenuous and needs pauses with carrying groceries. She had COVID in September 2022. Has completed chemo in July 2022. She is not currently on any inhalers but has used them when she was on COVID.   09/17/21 Since her last visit she was started on Stiolto she reports partial benefit. No nocturnal symptoms. Still feels fatigued. Limited by shortness of breath. Coughs daily which is related to her esophagus. On Prilosec and planning for dilation this summer. She is currently smoking 3/4 ppd. Has previously quit for 6 months in the past but restarted when her husband restarted. Has not exercised routinely but has a stationary bike that is available at home.  03/05/22 Since her last visit she was stepped up to Trelegy. Reports she has more stamina. She is having less shortness of breath and mucous. No wheezing. Currently smoking 1ppd. She plans to cut down gradually  09/08/22 Since our last visit she is  walking daily for 15 min. Increased smoking from 1/2 ppd to 1 ppd. Recent stressors including her stepdaughter moved back in due to recent family issues since January. She is still coughing and noticing more phelgm with her new upper dentures. Reports some shortness of breath but not as bad as before. No wheezing. Compliant with Trelegy.  Social History: Active smoker. 20 pack years. 1 pack per day   Past Medical History:  Diagnosis Date   Acute kidney injury (HCC)    a. 09/2018   Anxiety    Aortic atherosclerosis (HCC)    a. 09/2018 noted on Chest CT.   Chronic combined systolic (congestive) and diastolic (congestive) heart failure (HCC)    a.) 09/2018 Echo: EF 45-50%, diff HK. Triv to small circumfirential pericardial eff. b.) TTE 02/14/2020: EF 60-65%; no RWMAs; G1DD; GLS -23.3%   Chronic DOE (dyspnea on exertion)    Claudication (HCC)    a. Bilat hip claudication since ~ 2019.   COPD (chronic obstructive pulmonary disease) (HCC)    Coronary artery calcification seen on CT scan 09/20/2018   Depression    Dyspnea    Emphysema lung (HCC)    Exertional angina    a. Ex angina since ~ 2019.   GERD (gastroesophageal reflux disease)    Goals of care, counseling/discussion 11/03/2018   Hiatal hernia    History of 2019 novel coronavirus disease (COVID-19) 01/16/2021   History of bronchitis    History of kidney stones    Hypothyroidism    Pleural effusion    a. 09/2018 s/p thoracentesis-->958ml.   Resting tremor  Tobacco abuse    Waldenstrom's macroglobulinemia (HCC) 11/03/2018     Family History  Problem Relation Age of Onset   Stroke Mother        Mini-strokes. Died @ 20.   Dementia Mother    Lung cancer Father        Died in his 30's   Addison's disease Sister    Hypertension Brother    CAD Brother    Hypertension Brother      Social History   Occupational History   Not on file  Tobacco Use   Smoking status: Every Day    Packs/day: 1.00    Years: 40.00     Additional pack years: 0.00    Total pack years: 40.00    Types: Cigarettes   Smokeless tobacco: Never  Vaping Use   Vaping Use: Never used  Substance and Sexual Activity   Alcohol use: Not Currently    Comment: 1-2 gl wine / night - none since 07/2018   Drug use: Not Currently    Comment: prev used drugs ~ 35 yrs ago.   Sexual activity: Not on file    Allergies  Allergen Reactions   Codeine Nausea And Vomiting     Outpatient Medications Prior to Visit  Medication Sig Dispense Refill   Acetaminophen (ACETAMINOPHEN EXTRA STRENGTH) 500 MG capsule Take 1,000 mg by mouth every 6 (six) hours as needed for pain.     carvedilol (COREG) 12.5 MG tablet TAKE 1 TABLET (12.5MG  TOTAL) BY MOUTH TWICE A DAY WITH MEALS 180 tablet 0   clonazePAM (KLONOPIN) 0.5 MG tablet 1/2 tablet in AM and 1 tablet in PM and 1/2 tablet prn anxiety 180 tablet 1   doxycycline (VIBRAMYCIN) 100 MG capsule 1 capsule Orally twice a day for 5 days     DULoxetine (CYMBALTA) 60 MG capsule Take 1 capsule (60 mg total) by mouth daily. 90 capsule 1   lidocaine-prilocaine (EMLA) cream Apply 1 application topically as needed. Apply to [facility] 1 hour before access 60 g 3   traZODone (DESYREL) 150 MG tablet Take 1 tablet (150 mg total) by mouth at bedtime. 90 tablet 3   triamcinolone ointment (KENALOG) 0.1 % Apply 1 application topically daily as needed (eczema).     Fluticasone-Umeclidin-Vilant (TRELEGY ELLIPTA) 200-62.5-25 MCG/ACT AEPB Inhale 1 puff into the lungs daily at 2 PM. 60 each 5   Facility-Administered Medications Prior to Visit  Medication Dose Route Frequency Provider Last Rate Last Admin   sodium chloride flush (NS) 0.9 % injection 10 mL  10 mL Intravenous PRN Josph Macho, MD   10 mL at 02/03/21 1210    Review of Systems  Constitutional:  Negative for chills, diaphoresis, fever, malaise/fatigue and weight loss.  HENT:  Negative for congestion.   Respiratory:  Positive for cough, sputum production and shortness  of breath. Negative for hemoptysis and wheezing.   Cardiovascular:  Negative for chest pain, palpitations and leg swelling.     Objective:   Vitals:   09/08/22 1517  BP: 128/80  Pulse: 76  Temp: 98.6 F (37 C)  TempSrc: Oral  SpO2: 97%  Weight: 139 lb 9.6 oz (63.3 kg)  Height: 5\' 6"  (1.676 m)   SpO2: 97 % O2 Device: None (Room air)  Physical Exam: General: Well-appearing, no acute distress HENT: , AT Eyes: EOMI, no scleral icterus Respiratory: Clear to auscultation bilaterally.  No crackles, wheezing or rales Cardiovascular: RRR, -M/R/G, no JVD Extremities:-Edema,-tenderness Neuro: AAO x4, CNII-XII grossly intact Psych:  Normal mood, normal affect  Data Reviewed:  Imaging: CT chest 09/20/2018-bilateral pleural effusions right >left.  Small pericardial effusion CT chest 10/18/2018-centrilobular emphysema.  Right lower lobe atelectasis with right pleural effusion CXR 01/16/2021- Right PAC, hyperinflation.  No infiltrate, effusion or edema.  PFT: 09/14/2013  FVC 2.96 (90%) FEV1 2.10 (83%) ratio 72 TLC 116% DLCO 72% Interpretation: Based on ATS guidelines, mild obstructive defect mildly reduced DLCO.  No significant bronchodilator response.  F-V consistent with obstructive defect possible emphysema however absence of overinflation is inconsistent with this.  Clinically correlate  Labs: CBC    Component Value Date/Time   WBC 3.7 (L) 06/02/2022 0835   WBC 3.9 (L) 05/15/2021 1047   RBC 3.60 (L) 06/02/2022 0835   HGB 12.1 06/02/2022 0835   HCT 36.7 06/02/2022 0835   PLT 230 06/02/2022 0835   MCV 101.9 (H) 06/02/2022 0835   MCH 33.6 06/02/2022 0835   MCHC 33.0 06/02/2022 0835   RDW 13.5 06/02/2022 0835   LYMPHSABS 1.1 06/02/2022 0835   MONOABS 0.3 06/02/2022 0835   EOSABS 0.1 06/02/2022 0835   BASOSABS 0.0 06/02/2022 0835   Absolute eosinophils 06/05/2021-100     Assessment & Plan:   Discussion: 72 year old active smoker with COPD, HTN, CKD stage III,  Waldenstrom's macroglobulinemia s/p cytoxan/rituxan, hypothyroidism who presents for COPD follow-up. Improved but remains symptomatic. Discussed clinical course and management of COPD including bronchodilator regimen, preventive care including vaccinations and action plan for exacerbation.  Emphysema - improved but remains symptomatic, not in exacerbation --CONTINUE Trelegy 200 ONE puff ONCE a day. REFILL --Encourage regular aerobic exercise with walking and stationary bike every other day --Provided handicap stick x 5 years  Tobacco abuse Patient is an active smoker. We discussed smoking cessation for 5 minutes. We discussed triggers and stressors and ways to deal with them. We discussed barriers to continued smoking and benefits of smoking cessation. Provided patient with information cessation techniques and interventions including [place] quitline.  Health Maintenance Immunization History  Administered Date(s) Administered   Influenza, High Dose Seasonal PF 02/27/2019   Moderna Sars-Covid-2 Vaccination 02/28/2020   PFIZER(Purple Top)SARS-COV-2 Vaccination 06/11/2019, 07/02/2019, 02/28/2020   Pneumococcal Conjugate-13 11/02/2017   Tdap 05/02/2012   CT Lung Screen - qualified. Discuss at next visit  No orders of the defined types were placed in this encounter.  Meds ordered this encounter  Medications   Fluticasone-Umeclidin-Vilant (TRELEGY ELLIPTA) 200-62.5-25 MCG/ACT AEPB    Sig: Inhale 1 puff into the lungs daily at 2 PM.    Dispense:  60 each    Refill:  5    Return in about 3 months (around 12/09/2022).  I have spent a total time of 32-minutes on the day of the appointment including chart review, data review, collecting history, coordinating care and discussing medical diagnosis and plan with the patient/family. Past medical history, allergies, medications were reviewed. Pertinent imaging, labs and tests included in this note have been reviewed and interpreted independently by  me.  Stormey Wilborn Mechele Collin, MD [facility] 09/08/2022 6:48 PM  Office Number [phone]

## 2022-09-21 ENCOUNTER — Other Ambulatory Visit: Payer: Self-pay | Admitting: Internal Medicine

## 2022-10-01 ENCOUNTER — Inpatient Hospital Stay: Payer: Medicare Other | Attending: Hematology & Oncology

## 2022-10-01 VITALS — BP 131/70 | HR 68 | Temp 97.9°F | Resp 18

## 2022-10-01 DIAGNOSIS — C880 Waldenstrom macroglobulinemia: Secondary | ICD-10-CM | POA: Insufficient documentation

## 2022-10-01 DIAGNOSIS — Z95828 Presence of other vascular implants and grafts: Secondary | ICD-10-CM

## 2022-10-01 DIAGNOSIS — Z452 Encounter for adjustment and management of vascular access device: Secondary | ICD-10-CM | POA: Insufficient documentation

## 2022-10-01 MED ORDER — HEPARIN SOD (PORK) LOCK FLUSH 100 UNIT/ML IV SOLN
500.0000 [IU] | Freq: Once | INTRAVENOUS | Status: AC
  Administered 2022-10-01: 500 [IU] via INTRAVENOUS

## 2022-10-01 MED ORDER — SODIUM CHLORIDE 0.9% FLUSH
10.0000 mL | Freq: Once | INTRAVENOUS | Status: AC
  Administered 2022-10-01: 10 mL via INTRAVENOUS

## 2022-11-12 ENCOUNTER — Telehealth: Payer: Self-pay

## 2022-11-12 DIAGNOSIS — N3281 Overactive bladder: Secondary | ICD-10-CM

## 2022-11-12 NOTE — Telephone Encounter (Signed)
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   Botox verification completed- Yes  Pa needed- Yes  Insurance contacted - Pa complete         Auth number:______[id number]_______________  Approval dates : ___until 12/31/2024_______________  ----- Message -----  From: Sueanne Margarita, CMA  Sent: 11/12/2022   9:20 AM EDT  To: Sueanne Margarita, CMA  Subject: RE: Botox 100 u                                Pt has medicare and is approved until 04/19/2023 decision id# [id number]   Botox scheduled:_____9/10/24_________________________________  U/A & Culture ordered and scheduled:__8/23/24__________________  Pt aware and instructions given.   JQ aware to order Botox-received.   Date: 11/12/22

## 2022-12-01 ENCOUNTER — Encounter: Payer: Self-pay | Admitting: Hematology & Oncology

## 2022-12-01 ENCOUNTER — Inpatient Hospital Stay: Payer: Medicare Other | Admitting: Hematology & Oncology

## 2022-12-01 ENCOUNTER — Inpatient Hospital Stay: Payer: Medicare Other

## 2022-12-01 ENCOUNTER — Inpatient Hospital Stay: Payer: Medicare Other | Attending: Hematology & Oncology

## 2022-12-01 ENCOUNTER — Other Ambulatory Visit: Payer: Self-pay

## 2022-12-01 VITALS — BP 137/76 | HR 63 | Temp 97.7°F | Resp 18 | Ht 66.0 in | Wt 143.8 lb

## 2022-12-01 DIAGNOSIS — C880 Waldenstrom macroglobulinemia: Secondary | ICD-10-CM

## 2022-12-01 DIAGNOSIS — F17200 Nicotine dependence, unspecified, uncomplicated: Secondary | ICD-10-CM | POA: Insufficient documentation

## 2022-12-01 DIAGNOSIS — D649 Anemia, unspecified: Secondary | ICD-10-CM | POA: Diagnosis not present

## 2022-12-01 DIAGNOSIS — Z95828 Presence of other vascular implants and grafts: Secondary | ICD-10-CM

## 2022-12-01 LAB — CBC WITH DIFFERENTIAL (CANCER CENTER ONLY)
Abs Immature Granulocytes: 0.26 10*3/uL — ABNORMAL HIGH (ref 0.00–0.07)
Basophils Absolute: 0 10*3/uL (ref 0.0–0.1)
Basophils Relative: 0 %
Eosinophils Absolute: 0 10*3/uL (ref 0.0–0.5)
Eosinophils Relative: 1 %
HCT: 37.2 % (ref 36.0–46.0)
Hemoglobin: 12.6 g/dL (ref 12.0–15.0)
Immature Granulocytes: 4 %
Lymphocytes Relative: 23 %
Lymphs Abs: 1.5 10*3/uL (ref 0.7–4.0)
MCH: 34.5 pg — ABNORMAL HIGH (ref 26.0–34.0)
MCHC: 33.9 g/dL (ref 30.0–36.0)
MCV: 101.9 fL — ABNORMAL HIGH (ref 80.0–100.0)
Monocytes Absolute: 0.5 10*3/uL (ref 0.1–1.0)
Monocytes Relative: 8 %
Neutro Abs: 4.1 10*3/uL (ref 1.7–7.7)
Neutrophils Relative %: 64 %
Platelet Count: 228 10*3/uL (ref 150–400)
RBC: 3.65 MIL/uL — ABNORMAL LOW (ref 3.87–5.11)
RDW: 12.9 % (ref 11.5–15.5)
WBC Count: 6.4 10*3/uL (ref 4.0–10.5)
nRBC: 0 % (ref 0.0–0.2)

## 2022-12-01 LAB — CMP (CANCER CENTER ONLY)
ALT: 11 U/L (ref 0–44)
AST: 16 U/L (ref 15–41)
Albumin: 4.3 g/dL (ref 3.5–5.0)
Alkaline Phosphatase: 59 U/L (ref 38–126)
Anion gap: 10 (ref 5–15)
BUN: 22 mg/dL (ref 8–23)
CO2: 23 mmol/L (ref 22–32)
Calcium: 9.2 mg/dL (ref 8.9–10.3)
Chloride: 107 mmol/L (ref 98–111)
Creatinine: 1.03 mg/dL — ABNORMAL HIGH (ref 0.44–1.00)
GFR, Estimated: 57 mL/min — ABNORMAL LOW (ref >60)
Glucose, Bld: 105 mg/dL — ABNORMAL HIGH (ref 70–99)
Potassium: 4.2 mmol/L (ref 3.5–5.1)
Sodium: 140 mmol/L (ref 135–145)
Total Bilirubin: 0.5 mg/dL (ref 0.3–1.2)
Total Protein: 6.4 g/dL — ABNORMAL LOW (ref 6.5–8.1)

## 2022-12-01 LAB — LACTATE DEHYDROGENASE: LDH: 164 U/L (ref 98–192)

## 2022-12-01 MED ORDER — SODIUM CHLORIDE 0.9% FLUSH
10.0000 mL | Freq: Once | INTRAVENOUS | Status: AC
  Administered 2022-12-01: 10 mL

## 2022-12-01 MED ORDER — HEPARIN SOD (PORK) LOCK FLUSH 100 UNIT/ML IV SOLN
500.0000 [IU] | Freq: Once | INTRAVENOUS | Status: AC
  Administered 2022-12-01: 500 [IU] via INTRAVENOUS

## 2022-12-01 NOTE — Progress Notes (Signed)
Hematology and Oncology Follow Up Visit  Tiffany Leon [id number] 18-Nov-1950 72 y.o. 12/01/2022   Principle Diagnosis:  Waldenstrom's macroglobulinemia Anemia, chronic renal insufficiency, probable rheumatoid arthritis  Current Therapy:   Rituxan/Cytoxan-- start cycle #1 on 11/10/2018 -- cytoxan d/c'ed on 12/01/2018 Rituxan 375mg /m2 IV q 2 months -- maintanence - completed on 10/2020 Retacrit 40,000 units sq for Hgb < 11   Interim History:  Tiffany Leon is here today for follow-up.  She is doing okay.  Unfortunately, she is still smoking.  I am sure some of this is from stress.  She has a lot going on with respect to her family.  I know that this is not easy for her.  I know she is doing a great job trying to maintain everything.  She and her husband will be going up to [place] for Labor Day weekend.  I know that they will have a great time.    Her last monoclonal studies did not show monoclonal spike in her blood.  Her IgM level was 75 mg/dL.    She has had no nausea or vomiting.  There is been no change in bowel or bladder habits.  She has had no leg swelling.  She has had no rashes.  There has been no bleeding.  Thankfully, there is been no problems with COVID.  Overall, I would have to say that her performance status is probably ECOG 1.   Medications:  Allergies as of 12/01/2022       Reactions   Codeine Nausea And Vomiting        Medication List        Accurate as of December 01, 2022  9:35 AM. If you have any questions, ask your nurse or doctor.          STOP taking these medications    doxycycline 100 MG capsule Commonly known as: VIBRAMYCIN Stopped by: Josph Macho       TAKE these medications    Acetaminophen Extra Strength 500 MG Caps Take 1,000 mg by mouth every 6 (six) hours as needed for pain.   carvedilol 12.5 MG tablet Commonly known as: COREG TAKE 1 TABLET (12.5MG  TOTAL) BY MOUTH TWICE A DAY WITH MEALS   clonazePAM 0.5 MG  tablet Commonly known as: KLONOPIN 1/2 tablet in AM and 1 tablet in PM and 1/2 tablet prn anxiety   DULoxetine 60 MG capsule Commonly known as: CYMBALTA Take 1 capsule (60 mg total) by mouth daily.   lidocaine-prilocaine cream Commonly known as: EMLA Apply 1 application topically as needed. Apply to [facility] 1 hour before access   traZODone 150 MG tablet Commonly known as: DESYREL Take 1 tablet (150 mg total) by mouth at bedtime.   Trelegy Ellipta 200-62.5-25 MCG/ACT Aepb Generic drug: Fluticasone-Umeclidin-Vilant Inhale 1 puff into the lungs daily at 2 PM.   triamcinolone ointment 0.1 % Commonly known as: KENALOG Apply 1 application topically daily as needed (eczema).        Allergies:  Allergies  Allergen Reactions   Codeine Nausea And Vomiting    Past Medical History, Surgical history, Social history, and Family History were reviewed and updated.  Review of Systems: Review of Systems  Constitutional: Negative.   HENT: Negative.    Eyes: Negative.   Respiratory: Negative.    Cardiovascular: Negative.   Gastrointestinal: Negative.   Genitourinary: Negative.   Musculoskeletal:  Positive for joint pain.  Skin: Negative.   Neurological: Negative.   Endo/Heme/Allergies: Negative.   Psychiatric/Behavioral: Negative.  Physical Exam:  height is 5\' 6"  (1.676 m) and weight is 143 lb 12.8 oz (65.2 kg). Her oral temperature is 97.7 F (36.5 C). Her blood pressure is 137/76 and her pulse is 63. Her respiration is 18 and oxygen saturation is 95%.   Wt Readings from Last 3 Encounters:  12/01/22 143 lb 12.8 oz (65.2 kg)  09/08/22 139 lb 9.6 oz (63.3 kg)  06/17/22 134 lb (60.8 kg)    Physical Exam Vitals reviewed.  HENT:     Head: Normocephalic and atraumatic.  Eyes:     Pupils: Pupils are equal, round, and reactive to light.  Cardiovascular:     Rate and Rhythm: Normal rate and regular rhythm.     Heart sounds: Normal heart sounds.  Pulmonary:     Effort:  Pulmonary effort is normal.     Breath sounds: Normal breath sounds.  Abdominal:     General: Bowel sounds are normal.     Palpations: Abdomen is soft.  Musculoskeletal:        General: No tenderness or deformity. Normal range of motion.     Cervical back: Normal range of motion.  Lymphadenopathy:     Cervical: No cervical adenopathy.  Skin:    General: Skin is warm and dry.     Findings: No erythema or rash.  Neurological:     Mental Status: She is alert and oriented to person, place, and time.  Psychiatric:        Behavior: Behavior normal.        Thought Content: Thought content normal.        Judgment: Judgment normal.      Lab Results  Component Value Date   WBC 6.4 12/01/2022   HGB 12.6 12/01/2022   HCT 37.2 12/01/2022   MCV 101.9 (H) 12/01/2022   PLT 228 12/01/2022   Lab Results  Component Value Date   FERRITIN 155 12/03/2021   IRON 98 12/03/2021   TIBC 283 12/03/2021   UIBC 185 12/03/2021   IRONPCTSAT 35 (H) 12/03/2021   Lab Results  Component Value Date   RETICCTPCT 2.2 12/25/2019   RBC 3.65 (L) 12/01/2022   Lab Results  Component Value Date   KPAFRELGTCHN 11.8 06/02/2022   LAMBDASER 10.8 06/02/2022   KAPLAMBRATIO 1.09 06/02/2022   Lab Results  Component Value Date   IGGSERUM 308 (L) 06/02/2022   IGA 61 (L) 06/02/2022   IGMSERUM 75 06/02/2022   Lab Results  Component Value Date   TOTALPROTELP 6.0 06/02/2022   ALBUMINELP 3.9 06/02/2022   A1GS 0.3 06/02/2022   A2GS 0.7 06/02/2022   BETS 0.8 06/02/2022   GAMS 0.3 (L) 06/02/2022   MSPIKE Not Observed 06/02/2022   SPEI Comment 12/25/2019     Chemistry      Component Value Date/Time   NA 140 12/01/2022 0835   K 4.2 12/01/2022 0835   CL 107 12/01/2022 0835   CO2 23 12/01/2022 0835   BUN 22 12/01/2022 0835   CREATININE 1.03 (H) 12/01/2022 0835      Component Value Date/Time   CALCIUM 9.2 12/01/2022 0835   ALKPHOS 59 12/01/2022 0835   AST 16 12/01/2022 0835   ALT 11 12/01/2022 0835    BILITOT 0.5 12/01/2022 0835       Impression and Plan: Tiffany Leon is a very pleasant 72 yo caucasian female with Waldenstrm's macroglobulinemia.  She has responded very well to Rituxan.  We initially had her on Rituxan with Cytoxan but she could not  tolerate the Cytoxan with her blood counts going down.  She did well with the maintenance Rituxan.  So far, she has been in remission.  Last Rituxan that she got was back in July 2022.  I am just happy that everything is looking good.  I hate that she is having problems in the family.  I know that she will do her best to try to help make things better for her family.  Hopefully, she will cut back on smoking.  Right now, we will still plan to get her back in 6 months.   Josph Macho, MD 8/14/20249:35 AM

## 2022-12-02 LAB — IGG, IGA, IGM
IgA: 63 mg/dL — ABNORMAL LOW (ref 64–422)
IgG (Immunoglobin G), Serum: 288 mg/dL — ABNORMAL LOW (ref 586–1602)
IgM (Immunoglobulin M), Srm: 67 mg/dL (ref 26–217)

## 2022-12-02 LAB — KAPPA/LAMBDA LIGHT CHAINS
Kappa free light chain: 9.9 mg/L (ref 3.3–19.4)
Kappa, lambda light chain ratio: 1.08 (ref 0.26–1.65)
Lambda free light chains: 9.2 mg/L (ref 5.7–26.3)

## 2022-12-03 LAB — PROTEIN ELECTROPHORESIS, SERUM, WITH REFLEX
A/G Ratio: 1.8 — ABNORMAL HIGH (ref 0.7–1.7)
Albumin ELP: 3.9 g/dL (ref 2.9–4.4)
Alpha-1-Globulin: 0.2 g/dL (ref 0.0–0.4)
Alpha-2-Globulin: 0.8 g/dL (ref 0.4–1.0)
Beta Globulin: 0.9 g/dL (ref 0.7–1.3)
Gamma Globulin: 0.3 g/dL — ABNORMAL LOW (ref 0.4–1.8)
Globulin, Total: 2.2 g/dL (ref 2.2–3.9)
Total Protein ELP: 6.1 g/dL (ref 6.0–8.5)

## 2022-12-10 ENCOUNTER — Other Ambulatory Visit: Payer: Medicare Other

## 2022-12-10 DIAGNOSIS — N3281 Overactive bladder: Secondary | ICD-10-CM

## 2022-12-10 LAB — URINALYSIS, COMPLETE
Bilirubin, UA: NEGATIVE
Glucose, UA: NEGATIVE
Ketones, UA: NEGATIVE
Nitrite, UA: NEGATIVE
Protein,UA: NEGATIVE
RBC, UA: NEGATIVE
Specific Gravity, UA: 1.015 (ref 1.005–1.030)
Urobilinogen, Ur: 0.2 mg/dL (ref 0.2–1.0)
pH, UA: 6 (ref 5.0–7.5)

## 2022-12-10 LAB — MICROSCOPIC EXAMINATION: WBC, UA: >30 /hpf — AB (ref 0–5)

## 2022-12-13 LAB — CULTURE, URINE COMPREHENSIVE

## 2022-12-22 ENCOUNTER — Other Ambulatory Visit: Payer: Self-pay | Admitting: Internal Medicine

## 2022-12-28 ENCOUNTER — Telehealth: Payer: Self-pay

## 2022-12-28 ENCOUNTER — Ambulatory Visit: Payer: Medicare Other | Admitting: Urology

## 2022-12-28 VITALS — BP 133/82 | HR 84 | Ht 60.5 in | Wt 142.2 lb

## 2022-12-28 DIAGNOSIS — N3281 Overactive bladder: Secondary | ICD-10-CM | POA: Diagnosis not present

## 2022-12-28 MED ORDER — CEPHALEXIN 250 MG PO CAPS
500.0000 mg | ORAL_CAPSULE | Freq: Once | ORAL | Status: AC
Start: 2022-12-28 — End: 2022-12-28
  Administered 2022-12-28: 500 mg via ORAL

## 2022-12-28 MED ORDER — ONABOTULINUMTOXINA 100 UNITS IJ SOLR
100.0000 [IU] | Freq: Once | INTRAMUSCULAR | Status: AC
Start: 2022-12-28 — End: 2022-12-28
  Administered 2022-12-28: 100 [IU] via INTRAMUSCULAR

## 2022-12-28 NOTE — Progress Notes (Signed)
Bladder Instillation  Due to Botox patient is present today for a Bladder Instillation of Lidocaine 2%. Patient was cleaned and prepped in a sterile fashion with betadine and lidocaine 2% jelly was instilled into the urethra.  A 14 FR catheter was inserted, urine return was noted 25 ml, urine was yellow in color.  60 ml was instilled into the bladder. The catheter was then removed. Patient tolerated well, no complications were noted. Patient held in bladder for 30 minutes prior to procedure starting.   Performed by: Hitesh Fouche H RMA    Follow up/ Additional notes: none

## 2022-12-28 NOTE — Progress Notes (Signed)
   12/28/22  CC:  Chief Complaint  Patient presents with   Botulinum Toxin Injection    HPI:  Tiffany Leon is a 72 y.o. female with a personal history of OAB on Botox and oxybutynin, right renal pelvic stone, and UTIs,  who presents today for an office Botox injection.   She has a past medical history of Waldenstrom macroglobulinemia, COPD, stage III chronic kidney disease, kidney stones, and congestive heart failure.   She is noted to have had previous procedures for a 6 mm stone in the right renal pelvis without hydronephrosis visualized on CT scan in 2020.    KUB on 05/06/2021 revealed right nephrolithiasis measuring up to approximately 1.3 cm.   She is s/p cytoscopy with intravesical Botox injection and right ureteroscopy on 05/25/2021. Intraoperative findings: Fairly unremarkable bladder.  Botox injected without difficulty.  Uncomplicated right ureteroscopy, stent left on tether.She removed her own stent.    RUS on 06/19/2021 visualized bilateral renal calculi measuring up to 1.5 cm.   Preoperative UA/urine culture was obtained.  Many WBC on UA but culture was negative on 12/10/22.  Overall, she has been doing extremely well on this treatment.  She reports NO accidents and is thrilled.  Patient was administered the appropriate periprocedural antibiotics if indicated.  Lidocaine was allowed to dwell in the bladder for 30 minutes prior to the procedure, see CMA note.  Consent was confirmed.  All questions answered.  Timeout was performed.  Vitals:   12/28/22 0947  BP: 133/82  Pulse: 84  NED. A&Ox3.   No respiratory distress   Abd soft, NT, ND Normal external genitalia with patent urethral meatus  Cystoscopy Procedure Note   Patient identification was confirmed, informed consent was obtained, and patient was prepped using Betadine solution.  Lidocaine jelly was administered per urethral meatus.     Procedure: - Flexible cystoscope introduced, without any difficulty.   -  Thorough search of the bladder revealed:    normal urethral meatus    normal urothelium    no stones    no ulcers     no tumors    no urethral polyps    no trabeculation   - Ureteral orifices were normal in position and appearance.   A Botox injection needle was used to inject a total of 100 units which was reconstituted in a total of 10 cc of saline.  A total of 2 rows of 5 injections (10 mL) was injected into the muscularis of the bladder.  This was well-tolerated.  There is slight oozing from a few of the injection sites but no diffuse bleeding.   Post-Procedure: - Patient tolerated the procedure well   Return in 6 mo for next botox  Vanna Scotland, MD

## 2022-12-28 NOTE — Telephone Encounter (Addendum)
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   Botox verification completed- Yes/No  Pa needed- Yes/No  Insurance contacted - Pa initiated/ complete         Auth number:_____________________  Approval dates : __________________   Denied:_________________________    Botox scheduled:______________________________________  U/A & Culture ordered and scheduled:____________________  Pt aware and instructions given.   JQ aware to order Botox.   Date:

## 2023-01-03 NOTE — Telephone Encounter (Signed)
Sent to [employer] for verification,  pt need prior auth, form filled out and put on Dr. Apolinar Junes desk 01/06/23.

## 2023-01-19 ENCOUNTER — Ambulatory Visit: Payer: Medicare Other | Attending: Internal Medicine | Admitting: Internal Medicine

## 2023-01-19 ENCOUNTER — Encounter: Payer: Self-pay | Admitting: Internal Medicine

## 2023-01-19 VITALS — BP 118/84 | HR 72 | Ht 65.0 in | Wt 140.0 lb

## 2023-01-19 DIAGNOSIS — I5022 Chronic systolic (congestive) heart failure: Secondary | ICD-10-CM | POA: Diagnosis not present

## 2023-01-19 DIAGNOSIS — I700 Atherosclerosis of aorta: Secondary | ICD-10-CM | POA: Diagnosis not present

## 2023-01-19 DIAGNOSIS — E785 Hyperlipidemia, unspecified: Secondary | ICD-10-CM

## 2023-01-19 DIAGNOSIS — I2510 Atherosclerotic heart disease of native coronary artery without angina pectoris: Secondary | ICD-10-CM

## 2023-01-19 NOTE — Progress Notes (Signed)
OFFICE NOTE  Chief Complaint:  Follow-up  Primary Care Physician: Chrystie Nose, MD  HPI:  Tiffany Leon is a 72 y.o. female with a past medial history significant for chronic combined systolic and diastolic congestive heart failure with recent echo in June 2020 showed EF 45 to 50% and diffuse hypokinesis with trivial to small pericardial effusion, aortic atherosclerosis and coronary artery calcifications noted on chest CT, acute kidney injury, pleural effusion status post thoracentesis, recent elevated sedimentation rate greater than 100 and other medical problems.  She was seen by me recently in the hospital for heart failure.  Initially she had acute renal failure and markedly elevated sedimentation rate.  She responded to diuresis with a plan for outpatient medication adjustment and ischemia work-up.  She was seen in a telemedicine visit by Tiffany Gavia, PA-C on 10/13/2018.  Blood pressure and still not been well controlled.  She was placed on BiDil in the hospital however this was cost effective and therefore was changed to hydralazine and Imdur.  Today her blood pressure remains elevated 168/88.  She is accompanied by her husband who notes that her blood pressures were quite high at home up to 200 systolic.  She says she feels poorly.  She was in a wheelchair and cannot walk up into the office today.  She gets chills but no fevers.  Recent COVID testing was negative in the hospital.  She has seen Dr. Myna Hidalgo with oncology who feels that she may have either hematologic malignancy and/or rheumatoid arthritis.  She did have a high RF test and given her high sedimentation rate this is a reasonable differential diagnosis.  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  Pan CT scans are pending as well as rheumatologic referral.  Heart failure wise she denies any worsening edema.  Her weight is much lower than dry weight by about 7 pounds today.  Appetite is decreased.  Remains on the  diuretic.  Multiple labs ordered by her oncologist about 4 days ago are pending.  01/29/2019  Tiffany Leon returns today for follow-up.  She apparently saw Dr. Nickola Major with rheumatology this morning, who felt that she did not have clinical joint symptoms consistent with RA however she was on treatment for her Tiffany Leon which is also treatment for RA.  Symptomatically she is improved somewhat.  Her oncologist feels that she could undergo ischemic evaluation.  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  08/31/2019  Tiffany Leon is seen today in follow-up.  Overall she says she is doing pretty well.  She denies any worsening shortness of breath or chest pain.  She is still undergoing some chemotherapy and has a low-dose CT coming up in May.  Weight is up about 4 pounds.  She did have some systolic heart failure with EF 45 to 50%.  She does not seem to recall this and when I told her today she seemed quite upset.  I explained it was only mildly reduced last year however the goal was to try to see if we could get her on optimal heart failure therapy.  That being said my plan was to try to see if I could get her off of the hydralazine and onto an ARB if her renal function improved.  Her creatinine most recently was 0.98 indicating that it has improved.  06/12/2020  Tiffany Leon is seen today in follow-up.  She seems to be  stably short of breath and fatigue.  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  Her last echo showed improvement in EF up to 60 to 65% in the fall.  Unfortunately she came off of valsartan due to hypotension.  She thinks a lot of her symptoms are due to seasonal depression which she struggled with.  She says she has about 3 more treatments left with her oncologist.  She denies any chest pain.  01/19/2023  Tiffany Leon returns today for follow-up.  She says she is doing fairly well.  No worsening  shortness of breath.  She continues to undergo follow-up for [facility] but she says she has completed therapy for that.  Blood pressure is well-controlled today.  She does have COPD without any recent exacerbation.  No signs or symptoms of heart failure.  PMHx:  Past Medical History:  Diagnosis Date   Acute kidney injury (HCC)    a. 09/2018   Anxiety    Aortic atherosclerosis (HCC)    a. 09/2018 noted on Chest CT.   Chronic combined systolic (congestive) and diastolic (congestive) heart failure (HCC)    a.) 09/2018 Echo: EF 45-50%, diff HK. Triv to small circumfirential pericardial eff. b.) TTE 02/14/2020: EF 60-65%; no RWMAs; G1DD; GLS -23.3%   Chronic DOE (dyspnea on exertion)    Claudication (HCC)    a. Bilat hip claudication since ~ 2019.   COPD (chronic obstructive pulmonary disease) (HCC)    Coronary artery calcification seen on CT scan 09/20/2018   Depression    Dyspnea    Emphysema lung (HCC)    Exertional angina (HCC)    a. Ex angina since ~ 2019.   GERD (gastroesophageal reflux disease)    Goals of care, counseling/discussion 11/03/2018   Hiatal hernia    History of 2019 novel coronavirus disease (COVID-19) 01/16/2021   History of bronchitis    History of kidney stones    Hypothyroidism    Pleural effusion    a. 09/2018 s/p thoracentesis-->974ml.   Resting tremor    Tobacco abuse    Waldenstrom's macroglobulinemia 11/03/2018    Past Surgical History:  Procedure Laterality Date   BOTOX INJECTION N/A 05/25/2021   Procedure: BOTOX INJECTION;  Surgeon: Tiffany Scotland, MD;  Location: [facility] ORS;  Service: Urology;  Laterality: N/A;   BREAST LUMPECTOMY WITH RADIOACTIVE SEED LOCALIZATION Left 12/28/2017   Procedure: BREAST LUMPECTOMY WITH RADIOACTIVE SEED LOCALIZATION;  Surgeon: Tiffany Miner, MD;  Location: [facility] OR;  Service: General;  Laterality: Left;   BREAST SURGERY     CYSTOSCOPY/URETEROSCOPY/HOLMIUM LASER/STENT PLACEMENT Right 05/25/2021   Procedure:  CYSTOSCOPY/URETEROSCOPY/HOLMIUM LASER/STENT PLACEMENT;  Surgeon: Tiffany Scotland, MD;  Location: [facility] ORS;  Service: Urology;  Laterality: Right;   DG THUMB RIGHT HAND ([facility] HX)     IR IMAGING GUIDED PORT INSERTION  11/09/2018   IR THORACENTESIS ASP PLEURAL SPACE W/IMG GUIDE  09/20/2018   KIDNEY STONE SURGERY      FAMHx:  Family History  Problem Relation Age of Onset   Stroke Mother        Mini-strokes. Died @ 86.   Dementia Mother    Lung cancer Father        Died in his 67's   Addison's disease Sister    Hypertension Brother    CAD Brother    Hypertension Brother     SOCHx:   reports that she has been smoking cigarettes. She has a 40 pack-year smoking history. She has never used smokeless tobacco. She  reports that she does not currently use alcohol. She reports that she does not currently use drugs.  ALLERGIES:  Allergies  Allergen Reactions   Codeine Nausea And Vomiting    ROS: Pertinent items noted in HPI and remainder of comprehensive ROS otherwise negative.  HOME MEDS: Current Outpatient Medications on File Prior to Visit  Medication Sig Dispense Refill   Acetaminophen (ACETAMINOPHEN EXTRA STRENGTH) 500 MG capsule Take 1,000 mg by mouth every 6 (six) hours as needed for pain.     carvedilol (COREG) 12.5 MG tablet TAKE 1 TABLET (12.5MG  TOTAL) BY MOUTH TWICE A DAY WITH MEALS 180 tablet 0   clonazePAM (KLONOPIN) 0.5 MG tablet 1/2 tablet in AM and 1 tablet in PM and 1/2 tablet prn anxiety 180 tablet 1   DULoxetine (CYMBALTA) 60 MG capsule Take 1 capsule (60 mg total) by mouth daily. 90 capsule 1   Fluticasone-Umeclidin-Vilant (TRELEGY ELLIPTA) 200-62.5-25 MCG/ACT AEPB Inhale 1 puff into the lungs daily at 2 PM. 60 each 5   lidocaine-prilocaine (EMLA) cream Apply 1 application topically as needed. Apply to [facility] 1 hour before access 60 g 3   traZODone (DESYREL) 150 MG tablet Take 1 tablet (150 mg total) by mouth at bedtime. 90 tablet 3   triamcinolone ointment (KENALOG) 0.1  % Apply 1 application topically daily as needed (eczema).     Current Facility-Administered Medications on File Prior to Visit  Medication Dose Route Frequency Provider Last Rate Last Admin   sodium chloride flush (NS) 0.9 % injection 10 mL  10 mL Intravenous PRN Josph Macho, MD   10 mL at 02/03/21 1210    LABS/IMAGING: No results found for this or any previous visit (from the past 48 hour(s)). No results found.  LIPID PANEL:    Component Value Date/Time   CHOL 121 09/28/2018 0328   TRIG 102 09/28/2018 0328   HDL 29 (L) 09/28/2018 0328   CHOLHDL 4.2 09/28/2018 0328   VLDL 20 09/28/2018 0328   LDLCALC 72 09/28/2018 0328     WEIGHTS: Wt Readings from Last 3 Encounters:  01/19/23 140 lb (63.5 kg)  12/28/22 142 lb 3 oz (64.5 kg)  12/01/22 143 lb 12.8 oz (65.2 kg)    VITALS: BP 118/84 (BP Location: Right Arm, Patient Position: Sitting, Cuff Size: Normal)   Pulse 72   Ht 5\' 5"  (1.651 m)   Wt 140 lb (63.5 kg)   SpO2 93%   BMI 23.30 kg/m   EXAM: General appearance: alert and no distress Lungs: diminished breath sounds bilaterally Heart: regular rate and rhythm Extremities: extremities normal, atraumatic, no cyanosis or edema Neurologic: Grossly normal  EKG: EKG Interpretation Date/Time:  Wednesday January 19 2023 10:01:21 EDT Ventricular Rate:  72 PR Interval:  144 QRS Duration:  86 QT Interval:  370 QTC Calculation: 405 R Axis:   14  Text Interpretation: Normal sinus rhythm Normal ECG When compared with ECG of 15-May-2021 10:43, Questionable change in QRS axis Nonspecific T wave abnormality no longer evident in Inferior leads Nonspecific T wave abnormality, improved in Lateral leads Confirmed by Tiffany Leon [phone]) on 01/19/2023 10:25:06 AM    ASSESSMENT: Chronic systolic congestive heart failure-LVEF 45-50% - improved to 60-65% (01/2020) Multivessel coronary artery calcification Waldenstrom's macroglobulinemia Recent acute kidney  injury COPD  PLAN: 1.   Tiffany Leon has no signs or symptoms of heart failure.  Her last echo in 2021 showed normal LV function.  She does have multivessel coronary calcification has had a low LDL and is  not on a statin.  We discussed this today.  She says she has finished treatment for Waldenstrm's.  Blood pressure appears well-controlled.  No changes to her meds today.  Plan follow-up with [place] annually or sooner as necessary.  Chrystie Nose, MD, [facility], FACP  [place]  [facility]  Medical Director of the Advanced Lipid Disorders &  Cardiovascular Risk Reduction Clinic Diplomate of the American Board of Clinical Lipidology Attending Cardiologist  Direct Dial: [phone]  Fax: [phone]  Website:  [email or web address] Nicely Zanaya Baize 01/19/2023, 10:25 AM

## 2023-01-19 NOTE — Patient Instructions (Signed)
Medication Instructions:  NO CHANGES  *If you need a refill on your cardiac medications before your next appointment, please call your pharmacy*   Follow-Up: At [facility], you and your health needs are our priority.  As part of our continuing mission to provide you with exceptional heart care, we have created designated Provider Care Teams.  These Care Teams include your primary Cardiologist (physician) and Advanced Practice Providers (APPs -  Physician Assistants and Nurse Practitioners) who all work together to provide you with the care you need, when you need it.  We recommend signing up for the patient portal called "MyChart".  Sign up information is provided on this After Visit Summary.  MyChart is used to connect with patients for Virtual Visits (Telemedicine).  Patients are able to view lab/test results, encounter notes, upcoming appointments, etc.  Non-urgent messages can be sent to your provider as well.   To learn more about what you can do with MyChart, go to [email or web address].    Your next appointment:    12 months with Dr. Hilty  

## 2023-01-21 ENCOUNTER — Other Ambulatory Visit: Payer: Self-pay | Admitting: Psychiatry

## 2023-01-21 DIAGNOSIS — F4001 Agoraphobia with panic disorder: Secondary | ICD-10-CM

## 2023-01-21 DIAGNOSIS — F330 Major depressive disorder, recurrent, mild: Secondary | ICD-10-CM

## 2023-01-21 NOTE — Telephone Encounter (Signed)
LF 10/26/22- 1 rf attached; lv 07/27/22; nv 01/27/23- rf not appropriate dt pt having 1 rf attached to last rx (unless the pharmacy is trying to stay ahead?)

## 2023-01-27 ENCOUNTER — Ambulatory Visit: Payer: Medicare Other | Admitting: Psychiatry

## 2023-01-28 ENCOUNTER — Other Ambulatory Visit: Payer: Self-pay | Admitting: Family Medicine

## 2023-01-28 DIAGNOSIS — F17200 Nicotine dependence, unspecified, uncomplicated: Secondary | ICD-10-CM

## 2023-02-02 ENCOUNTER — Inpatient Hospital Stay: Payer: Medicare Other | Attending: Hematology & Oncology

## 2023-02-02 VITALS — BP 130/64 | HR 71 | Temp 97.8°F | Resp 20

## 2023-02-02 DIAGNOSIS — C8800 Waldenstrom macroglobulinemia not having achieved remission: Secondary | ICD-10-CM | POA: Insufficient documentation

## 2023-02-02 DIAGNOSIS — Z452 Encounter for adjustment and management of vascular access device: Secondary | ICD-10-CM | POA: Insufficient documentation

## 2023-02-02 NOTE — Patient Instructions (Signed)

## 2023-02-10 ENCOUNTER — Ambulatory Visit
Admission: RE | Admit: 2023-02-10 | Discharge: 2023-02-10 | Disposition: A | Discharge status: Home / Self Care | Payer: Medicare Other | Source: Ambulatory Visit | Attending: Family Medicine | Admitting: Family Medicine

## 2023-02-10 DIAGNOSIS — F17200 Nicotine dependence, unspecified, uncomplicated: Secondary | ICD-10-CM

## 2023-02-11 ENCOUNTER — Telehealth: Payer: Self-pay | Admitting: Psychiatry

## 2023-02-11 ENCOUNTER — Other Ambulatory Visit: Payer: Self-pay | Admitting: Psychiatry

## 2023-02-11 DIAGNOSIS — F4001 Agoraphobia with panic disorder: Secondary | ICD-10-CM

## 2023-02-14 NOTE — Telephone Encounter (Signed)
Error

## 2023-03-16 ENCOUNTER — Encounter: Payer: Self-pay | Admitting: Psychiatry

## 2023-03-16 ENCOUNTER — Ambulatory Visit: Payer: Medicare Other | Admitting: Psychiatry

## 2023-03-16 DIAGNOSIS — F5105 Insomnia due to other mental disorder: Secondary | ICD-10-CM | POA: Diagnosis not present

## 2023-03-16 DIAGNOSIS — F330 Major depressive disorder, recurrent, mild: Secondary | ICD-10-CM

## 2023-03-16 DIAGNOSIS — F4001 Agoraphobia with panic disorder: Secondary | ICD-10-CM

## 2023-03-16 MED ORDER — CLONAZEPAM 0.5 MG PO TABS: ORAL_TABLET | ORAL | 0 refills | Status: DC

## 2023-03-16 MED ORDER — DULOXETINE HCL 30 MG PO CPEP: 90.0000 mg | ORAL_CAPSULE | Freq: Every day | ORAL | 0 refills | Status: DC
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
## 2023-03-31 ENCOUNTER — Other Ambulatory Visit: Payer: Self-pay | Admitting: Internal Medicine

## 2023-04-04 ENCOUNTER — Inpatient Hospital Stay: Payer: Medicare Other | Attending: Hematology & Oncology

## 2023-04-04 DIAGNOSIS — Z452 Encounter for adjustment and management of vascular access device: Secondary | ICD-10-CM | POA: Insufficient documentation

## 2023-04-04 DIAGNOSIS — C8800 Waldenstrom macroglobulinemia not having achieved remission: Secondary | ICD-10-CM | POA: Insufficient documentation

## 2023-04-05 ENCOUNTER — Other Ambulatory Visit: Payer: Self-pay | Admitting: Family Medicine

## 2023-04-05 DIAGNOSIS — R55 Syncope and collapse: Secondary | ICD-10-CM

## 2023-04-06 ENCOUNTER — Inpatient Hospital Stay: Payer: Medicare Other

## 2023-04-06 DIAGNOSIS — C8800 Waldenstrom macroglobulinemia not having achieved remission: Secondary | ICD-10-CM | POA: Diagnosis not present

## 2023-04-06 DIAGNOSIS — Z452 Encounter for adjustment and management of vascular access device: Secondary | ICD-10-CM | POA: Diagnosis present

## 2023-04-06 NOTE — Patient Instructions (Signed)

## 2023-04-20 ENCOUNTER — Other Ambulatory Visit: Payer: Self-pay | Admitting: Psychiatry

## 2023-04-20 DIAGNOSIS — F4001 Agoraphobia with panic disorder: Secondary | ICD-10-CM

## 2023-04-20 DIAGNOSIS — F330 Major depressive disorder, recurrent, mild: Secondary | ICD-10-CM

## 2023-04-22 ENCOUNTER — Ambulatory Visit
Admission: RE | Admit: 2023-04-22 | Discharge: 2023-04-22 | Disposition: A | Discharge status: Home / Self Care | Payer: Medicare Other | Source: Ambulatory Visit | Attending: Family Medicine | Admitting: Family Medicine

## 2023-04-22 DIAGNOSIS — R55 Syncope and collapse: Secondary | ICD-10-CM

## 2023-05-07 ENCOUNTER — Encounter: Payer: Self-pay | Admitting: Hematology & Oncology

## 2023-05-23 ENCOUNTER — Ambulatory Visit: Payer: Medicare Other | Admitting: Psychiatry

## 2023-06-01 ENCOUNTER — Inpatient Hospital Stay: Payer: Medicare Other

## 2023-06-01 ENCOUNTER — Inpatient Hospital Stay: Payer: Medicare Other | Admitting: Family

## 2023-06-01 ENCOUNTER — Inpatient Hospital Stay: Payer: Medicare Other | Attending: Hematology & Oncology

## 2023-06-01 ENCOUNTER — Encounter: Payer: Self-pay | Admitting: Family

## 2023-06-01 VITALS — BP 128/80 | HR 88 | Temp 97.9°F | Resp 17 | Wt 143.0 lb

## 2023-06-01 DIAGNOSIS — C8800 Waldenstrom macroglobulinemia not having achieved remission: Secondary | ICD-10-CM | POA: Insufficient documentation

## 2023-06-01 DIAGNOSIS — D631 Anemia in chronic kidney disease: Secondary | ICD-10-CM

## 2023-06-01 DIAGNOSIS — Z95828 Presence of other vascular implants and grafts: Secondary | ICD-10-CM

## 2023-06-01 DIAGNOSIS — D509 Iron deficiency anemia, unspecified: Secondary | ICD-10-CM

## 2023-06-01 DIAGNOSIS — N189 Chronic kidney disease, unspecified: Secondary | ICD-10-CM | POA: Diagnosis not present

## 2023-06-01 DIAGNOSIS — N1831 Chronic kidney disease, stage 3a: Secondary | ICD-10-CM | POA: Diagnosis not present

## 2023-06-01 LAB — IRON AND IRON BINDING CAPACITY (CC-WL,HP ONLY)
Iron: 134 ug/dL (ref 28–170)
Saturation Ratios: 35 % — ABNORMAL HIGH (ref 10.4–31.8)
TIBC: 381 ug/dL (ref 250–450)
UIBC: 247 ug/dL (ref 148–442)

## 2023-06-01 LAB — CBC WITH DIFFERENTIAL (CANCER CENTER ONLY)
Abs Immature Granulocytes: 0.08 10*3/uL — ABNORMAL HIGH (ref 0.00–0.07)
Basophils Absolute: 0 10*3/uL (ref 0.0–0.1)
Basophils Relative: 0 %
Eosinophils Absolute: 0 10*3/uL (ref 0.0–0.5)
Eosinophils Relative: 0 %
HCT: 39.3 % (ref 36.0–46.0)
Hemoglobin: 13.1 g/dL (ref 12.0–15.0)
Immature Granulocytes: 2 %
Lymphocytes Relative: 28 %
Lymphs Abs: 1.3 10*3/uL (ref 0.7–4.0)
MCH: 33.6 pg (ref 26.0–34.0)
MCHC: 33.3 g/dL (ref 30.0–36.0)
MCV: 100.8 fL — ABNORMAL HIGH (ref 80.0–100.0)
Monocytes Absolute: 0.4 10*3/uL (ref 0.1–1.0)
Monocytes Relative: 8 %
Neutro Abs: 2.9 10*3/uL (ref 1.7–7.7)
Neutrophils Relative %: 62 %
Platelet Count: 265 10*3/uL (ref 150–400)
RBC: 3.9 MIL/uL (ref 3.87–5.11)
RDW: 13.6 % (ref 11.5–15.5)
WBC Count: 4.7 10*3/uL (ref 4.0–10.5)
nRBC: 0 % (ref 0.0–0.2)

## 2023-06-01 LAB — CMP (CANCER CENTER ONLY)
ALT: 11 U/L (ref 0–44)
AST: 16 U/L (ref 15–41)
Albumin: 4.3 g/dL (ref 3.5–5.0)
Alkaline Phosphatase: 57 U/L (ref 38–126)
Anion gap: 9 (ref 5–15)
BUN: 20 mg/dL (ref 8–23)
CO2: 26 mmol/L (ref 22–32)
Calcium: 9.4 mg/dL (ref 8.9–10.3)
Chloride: 107 mmol/L (ref 98–111)
Creatinine: 1.05 mg/dL — ABNORMAL HIGH (ref 0.44–1.00)
GFR, Estimated: 56 mL/min — ABNORMAL LOW (ref >60)
Glucose, Bld: 92 mg/dL (ref 70–99)
Potassium: 3.9 mmol/L (ref 3.5–5.1)
Sodium: 142 mmol/L (ref 135–145)
Total Bilirubin: 0.4 mg/dL (ref 0.0–1.2)
Total Protein: 5.9 g/dL — ABNORMAL LOW (ref 6.5–8.1)

## 2023-06-01 LAB — FERRITIN: Ferritin: 68 ng/mL (ref 11–307)

## 2023-06-01 LAB — LACTATE DEHYDROGENASE: LDH: 154 U/L (ref 98–192)

## 2023-06-01 MED ORDER — ALTEPLASE 2 MG IJ SOLR
2.0000 mg | Freq: Once | INTRAMUSCULAR | Status: AC
  Administered 2023-06-01: 2 mg
  Filled 2023-06-01: qty 2

## 2023-06-01 MED ORDER — SODIUM CHLORIDE 0.9% FLUSH
10.0000 mL | Freq: Once | INTRAVENOUS | Status: AC
  Administered 2023-06-01: 10 mL via INTRAVENOUS

## 2023-06-01 MED ORDER — HEPARIN SOD (PORK) LOCK FLUSH 100 UNIT/ML IV SOLN
500.0000 [IU] | Freq: Once | INTRAVENOUS | Status: AC
  Administered 2023-06-01: 500 [IU] via INTRAVENOUS

## 2023-06-01 NOTE — Progress Notes (Signed)
Hematology and Oncology Follow Up Visit  Lianette Broussard [id number] [birth year] 73 y.o. 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 Toppins is here today for follow-up. She is doing well but has noted some fatigue at times.  She has SOB with over exertion and states that her inhalers are effective in treating her symptoms.  No M-spike was noted at last visit and IgM level was 67 mg/dL.  No fever, chills, n/v, cough, rash, dizziness, chest pain, palpitations, abdominal pain or changes in bowel or bladder habits.  No swelling, numbness or tingling in her extremities at this time.  She has had several falls recently and thankfully states that she was not seriously injured. She states that her work up so far has been negative.  Appetite and hydration are good. Weight is 143 lbs.   ECOG Performance Status: 0 - Asymptomatic  Medications:  Allergies as of 06/01/2023       Reactions   Codeine Nausea And Vomiting        Medication List        Accurate as of June 01, 2023 10:28 AM. If you have any questions, ask your nurse or doctor.          Acetaminophen Extra Strength 500 MG Caps Take 1,000 mg by mouth every 6 (six) hours as needed for pain.   albuterol 108 (90 Base) MCG/ACT inhaler Commonly known as: VENTOLIN HFA Inhale 1-2 puffs into the lungs every 4 (four) hours as needed.   carvedilol 12.5 MG tablet Commonly known as: COREG TAKE 1 TABLET (12.5MG  TOTAL) BY MOUTH TWICE A DAY WITH MEALS   clonazePAM 0.5 MG tablet Commonly known as: KLONOPIN 1/2 TABLET IN AM AND 1 TABLET IN PM AND 1/2 TABLET AS NEEDED FOR ANXIETY   DULoxetine 30 MG  capsule Commonly known as: CYMBALTA Take 3 capsules (90 mg total) by mouth daily.   lidocaine-prilocaine cream Commonly known as: EMLA Apply 1 application topically as needed. Apply to [facility] 1 hour before access   omeprazole 10 MG capsule Commonly known as: PRILOSEC Take 10 mg by mouth daily.   traZODone 150 MG tablet Commonly known as: DESYREL Take 1 tablet (150 mg total) by mouth at bedtime.   Trelegy Ellipta 200-62.5-25 MCG/ACT Aepb Generic drug: Fluticasone-Umeclidin-Vilant Inhale 1 puff into the lungs daily at 2 PM.   triamcinolone ointment 0.1 % Commonly known as: KENALOG Apply 1 application topically daily as needed (eczema).   varenicline 1 MG tablet Commonly known as: CHANTIX Take 1 mg by mouth 2 (two) times daily.        Allergies:  Allergies  Allergen Reactions   Codeine Nausea And Vomiting    Past Medical History, Surgical history, Social history, and Family History were reviewed and updated.  Review of Systems: All other 10 point review of systems is negative.   Physical Exam:  weight is 143 lb (64.9 kg). Her oral temperature is 97.9 F (36.6 C). Her blood pressure is 128/80 and her pulse is 88. Her respiration is 17 and oxygen saturation is 99%.   Wt Readings from Last 3 Encounters:  06/01/23 143 lb (64.9 kg)  01/19/23 140 lb (63.5 kg)  12/28/22  142 lb 3 oz (64.5 kg)    Ocular: Sclerae unicteric, pupils equal, round and reactive to light Ear-nose-throat: Oropharynx clear, dentition fair Lymphatic: No cervical or supraclavicular adenopathy Lungs no rales or rhonchi, good excursion bilaterally Heart regular rate and rhythm, no murmur appreciated Abd soft, nontender, positive bowel sounds MSK no focal spinal tenderness, no joint edema Neuro: non-focal, well-oriented, appropriate affect Breasts: Deferred   Lab Results  Component Value Date   WBC 4.7 06/01/2023   HGB 13.1 06/01/2023   HCT 39.3 06/01/2023   MCV 100.8 (H) 06/01/2023   PLT 265  06/01/2023   Lab Results  Component Value Date   FERRITIN 155 12/03/2021   IRON 98 12/03/2021   TIBC 283 12/03/2021   UIBC 185 12/03/2021   IRONPCTSAT 35 (H) 12/03/2021   Lab Results  Component Value Date   RETICCTPCT 2.2 12/25/2019   RBC 3.90 06/01/2023   Lab Results  Component Value Date   KPAFRELGTCHN 9.9 12/01/2022   LAMBDASER 9.2 12/01/2022   KAPLAMBRATIO 1.08 12/01/2022   Lab Results  Component Value Date   IGGSERUM 288 (L) 12/01/2022   IGA 63 (L) 12/01/2022   IGMSERUM 67 12/01/2022   Lab Results  Component Value Date   TOTALPROTELP 6.1 12/01/2022   ALBUMINELP 3.9 12/01/2022   A1GS 0.2 12/01/2022   A2GS 0.8 12/01/2022   BETS 0.9 12/01/2022   GAMS 0.3 (L) 12/01/2022   MSPIKE Not Observed 12/01/2022   SPEI Comment 12/25/2019     Chemistry      Component Value Date/Time   NA 140 12/01/2022 0835   K 4.2 12/01/2022 0835   CL 107 12/01/2022 0835   CO2 23 12/01/2022 0835   BUN 22 12/01/2022 0835   CREATININE 1.03 (H) 12/01/2022 0835      Component Value Date/Time   CALCIUM 9.2 12/01/2022 0835   ALKPHOS 59 12/01/2022 0835   AST 16 12/01/2022 0835   ALT 11 12/01/2022 0835   BILITOT 0.5 12/01/2022 0835       Impression and Plan: Ms. Fisk is a very pleasant 73 yo caucasian female with Waldenstrm's macroglobulinemia. She responded well to Rituxan. Initially she was on Rituxan with Cytoxan but she could not tolerate the Cytoxan with her blood counts going down.  She did well with the maintenance Rituxan completed in July 2022.   So far she continues to do well and has stayed in remission.  Protein and iron studies are pending.  Port flush every 8 weeks and follow-up in 6 months.   Eileen Stanford, NP 2/12/202510:28 AM

## 2023-06-01 NOTE — Patient Instructions (Signed)

## 2023-06-02 LAB — KAPPA/LAMBDA LIGHT CHAINS
Kappa free light chain: 12.1 mg/L (ref 3.3–19.4)
Kappa, lambda light chain ratio: 1.21 (ref 0.26–1.65)
Lambda free light chains: 10 mg/L (ref 5.7–26.3)

## 2023-06-03 ENCOUNTER — Telehealth: Payer: Self-pay | Admitting: Pharmacy Technician

## 2023-06-03 LAB — IGG, IGA, IGM
IgA: 61 mg/dL — ABNORMAL LOW (ref 64–422)
IgG (Immunoglobin G), Serum: 264 mg/dL — ABNORMAL LOW (ref 586–1602)
IgM (Immunoglobulin M), Srm: 67 mg/dL (ref 26–217)

## 2023-06-03 NOTE — Telephone Encounter (Signed)
Auth Submission: APPROVED Site of care: [facility] Payer: [employer] MEDICARE Medication & CPT/J Code(s) submitted: YQMVH Q4696 Route of submission (phone, fax, portal): PORTAL Phone # Fax # Auth type: Buy/Bill PB Units/visits requested: 100 UNITS Reference number: [id number] Approval from: 06/03/23 to 06/03/23     Approval scanned to media tab

## 2023-06-06 LAB — PROTEIN ELECTROPHORESIS, SERUM, WITH REFLEX
A/G Ratio: 1.6 (ref 0.7–1.7)
Albumin ELP: 3.6 g/dL (ref 2.9–4.4)
Alpha-1-Globulin: 0.2 g/dL (ref 0.0–0.4)
Alpha-2-Globulin: 0.7 g/dL (ref 0.4–1.0)
Beta Globulin: 1 g/dL (ref 0.7–1.3)
Gamma Globulin: 0.2 g/dL — ABNORMAL LOW (ref 0.4–1.8)
Globulin, Total: 2.2 g/dL (ref 2.2–3.9)
Total Protein ELP: 5.8 g/dL — ABNORMAL LOW (ref 6.0–8.5)

## 2023-06-10 ENCOUNTER — Other Ambulatory Visit: Payer: Self-pay | Admitting: Psychiatry

## 2023-06-10 DIAGNOSIS — F330 Major depressive disorder, recurrent, mild: Secondary | ICD-10-CM

## 2023-06-10 DIAGNOSIS — F4001 Agoraphobia with panic disorder: Secondary | ICD-10-CM

## 2023-06-10 NOTE — Telephone Encounter (Signed)
Pt called and scheduled appt for 5/1

## 2023-06-10 NOTE — Telephone Encounter (Signed)
Please schedule pt an appt. LV 11/217, NS 2/3.

## 2023-06-10 NOTE — Telephone Encounter (Signed)
 LVM to schedule f/u

## 2023-06-13 NOTE — Addendum Note (Signed)
 Addended by: Consuella Lose on: 06/13/2023 03:23 PM   Modules accepted: Orders

## 2023-06-13 NOTE — Telephone Encounter (Signed)
 Approved 2.14.25 - 2.14.26 Auth# [id number]  Left message for patient to call back to schedule for Botox 100 u and lab visti for UA and Urine culture

## 2023-06-30 ENCOUNTER — Other Ambulatory Visit: Payer: Self-pay | Admitting: Internal Medicine

## 2023-07-05 ENCOUNTER — Telehealth: Payer: Self-pay | Admitting: Urology

## 2023-07-05 NOTE — Telephone Encounter (Signed)
 Pt called asking about when her next Botox should be scheduled.

## 2023-07-05 NOTE — Telephone Encounter (Signed)
Spoke with patient and appointments scheduled.

## 2023-07-10 ENCOUNTER — Other Ambulatory Visit: Payer: Self-pay | Admitting: Psychiatry

## 2023-07-10 DIAGNOSIS — F4001 Agoraphobia with panic disorder: Secondary | ICD-10-CM

## 2023-07-10 DIAGNOSIS — F330 Major depressive disorder, recurrent, mild: Secondary | ICD-10-CM

## 2023-07-27 ENCOUNTER — Inpatient Hospital Stay: Payer: Medicare Other | Attending: Hematology & Oncology

## 2023-07-27 VITALS — BP 136/78 | HR 88 | Temp 97.7°F | Resp 16

## 2023-07-27 DIAGNOSIS — Z452 Encounter for adjustment and management of vascular access device: Secondary | ICD-10-CM | POA: Insufficient documentation

## 2023-07-27 DIAGNOSIS — C8800 Waldenstrom macroglobulinemia not having achieved remission: Secondary | ICD-10-CM | POA: Diagnosis present

## 2023-07-27 DIAGNOSIS — D509 Iron deficiency anemia, unspecified: Secondary | ICD-10-CM

## 2023-07-27 MED ORDER — SODIUM CHLORIDE 0.9% FLUSH
10.0000 mL | INTRAVENOUS | Status: DC | PRN
  Administered 2023-07-27: 10 mL via INTRAVENOUS

## 2023-07-27 MED ORDER — HEPARIN SOD (PORK) LOCK FLUSH 100 UNIT/ML IV SOLN
500.0000 [IU] | Freq: Once | INTRAVENOUS | Status: AC
  Administered 2023-07-27: 500 [IU] via INTRAVENOUS

## 2023-07-27 NOTE — Patient Instructions (Signed)

## 2023-07-29 ENCOUNTER — Other Ambulatory Visit

## 2023-07-29 DIAGNOSIS — N3281 Overactive bladder: Secondary | ICD-10-CM

## 2023-07-29 LAB — URINALYSIS, COMPLETE
Bilirubin, UA: NEGATIVE
Glucose, UA: NEGATIVE
Ketones, UA: NEGATIVE
Nitrite, UA: POSITIVE — AB
Protein,UA: NEGATIVE
RBC, UA: NEGATIVE
Specific Gravity, UA: 1.015 (ref 1.005–1.030)
Urobilinogen, Ur: 0.2 mg/dL (ref 0.2–1.0)
pH, UA: 6 (ref 5.0–7.5)

## 2023-07-29 LAB — MICROSCOPIC EXAMINATION
Epithelial Cells (non renal): >10 /HPF — AB (ref 0–10)
WBC, UA: >30 /HPF — AB (ref 0–5)

## 2023-07-31 ENCOUNTER — Other Ambulatory Visit: Payer: Self-pay | Admitting: Psychiatry

## 2023-07-31 DIAGNOSIS — F5105 Insomnia due to other mental disorder: Secondary | ICD-10-CM

## 2023-08-02 ENCOUNTER — Telehealth: Payer: Self-pay

## 2023-08-02 LAB — CULTURE, URINE COMPREHENSIVE

## 2023-08-02 NOTE — Telephone Encounter (Signed)
 Left message for patient to call back to discuss results.  Also advised her to review mychart.  Script has not been sent.

## 2023-08-02 NOTE — Telephone Encounter (Signed)
-----   Message from Dustin Gimenez sent at 08/02/2023  7:55 AM EDT ----- Preprocedure urine culture is positive.  If she is not having symptoms, have her hold therapy until 5 days before the procedure.  I'd like her to take Keflex 500 mg 3 times a day starting 5 days before the procedure for total 7 days.  Dustin Gimenez, MD

## 2023-08-03 MED ORDER — CEPHALEXIN 500 MG PO CAPS: 500.0000 mg | ORAL_CAPSULE | Freq: Three times a day (TID) | ORAL | 0 refills | Status: DC

## 2023-08-03 NOTE — Telephone Encounter (Signed)
 Patient aware of results.  No symptoms at this time.  Script sent.  Patient understands.

## 2023-08-08 ENCOUNTER — Other Ambulatory Visit: Payer: Self-pay | Admitting: Psychiatry

## 2023-08-08 DIAGNOSIS — F4001 Agoraphobia with panic disorder: Secondary | ICD-10-CM

## 2023-08-08 NOTE — Telephone Encounter (Signed)
 Pt is requesting refill of Clonazepam  to   [id number] [place], [place] - [address] [place] [address] Phone: [phone]  Fax: [phone]   Next appt 7/1

## 2023-08-18 ENCOUNTER — Ambulatory Visit: Payer: Medicare Other | Admitting: Psychiatry

## 2023-08-24 ENCOUNTER — Ambulatory Visit: Admitting: Urology

## 2023-08-24 ENCOUNTER — Other Ambulatory Visit: Payer: Self-pay | Admitting: Psychiatry

## 2023-08-24 VITALS — BP 142/87 | HR 73 | Ht 65.0 in | Wt 142.2 lb

## 2023-08-24 DIAGNOSIS — N3281 Overactive bladder: Secondary | ICD-10-CM

## 2023-08-24 DIAGNOSIS — Z792 Long term (current) use of antibiotics: Secondary | ICD-10-CM | POA: Diagnosis not present

## 2023-08-24 DIAGNOSIS — F5105 Insomnia due to other mental disorder: Secondary | ICD-10-CM

## 2023-08-24 MED ORDER — ONABOTULINUMTOXINA 100 UNITS IJ SOLR
100.0000 [IU] | Freq: Once | INTRAMUSCULAR | Status: AC
  Administered 2023-08-24: 100 [IU] via INTRAMUSCULAR

## 2023-08-24 MED ORDER — CEPHALEXIN 250 MG PO CAPS
500.0000 mg | ORAL_CAPSULE | Freq: Once | ORAL | Status: AC
  Administered 2023-08-24: 500 mg via ORAL

## 2023-08-24 NOTE — Progress Notes (Signed)
 Bladder Instillation  Due to Botox  patient is present today for a Bladder Instillation of Lidocaine  2%. Patient was cleaned and prepped in a sterile fashion with betadine and lidocaine  2% jelly was instilled into the urethra.  A 14 FR catheter was inserted, urine return was noted 20 ml, urine was yellow in color.  60 ml was instilled into the bladder. The catheter was then removed. Patient tolerated well, no complications were noted. Patient held in bladder for 30 minutes prior to procedure starting.   Performed by: Gisella Alwine H RMA  Follow up/ Additional notes: 6 months

## 2023-08-24 NOTE — Progress Notes (Signed)
   08/24/23  CC:  Chief Complaint  Patient presents with   Botulinum Toxin Injection    HPI:  Ellajean Kiehne is a 73 y.o. female with a personal history of OAB on Botox  and oxybutynin , right renal pelvic stone, and UTIs,  who presents today for an office Botox  injection.   She has a past medical history of Waldenstrom macroglobulinemia, COPD, stage III chronic kidney disease, kidney stones, and congestive heart failure.   She is noted to have had previous procedures for a 6 mm stone in the right renal pelvis without hydronephrosis visualized on CT scan in 2020.    KUB on 05/06/2021 revealed right nephrolithiasis measuring up to approximately 1.3 cm.   She is s/p cytoscopy with intravesical Botox  injection and right ureteroscopy on 05/25/2021. Intraoperative findings: Fairly unremarkable bladder.  Botox  injected without difficulty.  Uncomplicated right ureteroscopy, stent left on tether.She removed her own stent.    RUS on 06/19/2021 visualized bilateral renal calculi measuring up to 1.5 cm.   Preoperative UA/urine culture was obtained.    Overall, she has been doing extremely well on this treatment.  Returns today for injection.  Patient was administered the appropriate periprocedural antibiotics if indicated.  Lidocaine  was allowed to dwell in the bladder for 30 minutes prior to the procedure, see CMA note.  Consent was confirmed.  All questions answered.  Timeout was performed.  Vitals:   08/24/23 1107  BP: (!) 142/87  Pulse: 73  NED. A&Ox3.   No respiratory distress   Abd soft, NT, ND Normal external genitalia with patent urethral meatus  Cystoscopy Procedure Note   Patient identification was confirmed, informed consent was obtained, and patient was prepped using Betadine solution.  Lidocaine  jelly was administered per urethral meatus.     Procedure: - Flexible cystoscope introduced, without any difficulty.   - Thorough search of the bladder revealed:    normal urethral  meatus    normal urothelium    no stones    no ulcers     no tumors    no urethral polyps    no trabeculation   - Ureteral orifices were normal in position and appearance.   A Botox  injection needle was used to inject a total of 100 units which was reconstituted in a total of 10 cc of saline.  A total of 2 rows of 5 injections (10 mL) was injected into the muscularis of the bladder.  This was well-tolerated.    There was pulsatile bleeding from one single injection site.  At this point, bladder was drained using red rubber catheter,.  Cystoscopic fluids exchanged to water and the scope was reinserted.  The site of bleeding was identified and fulgurated using Bugbee electrocautery on the setting of 30.  This was well-tolerated in the bleeding stopped.     Post-Procedure: - Patient tolerated the procedure well   Return in 6 mo for next botox   Dustin Gimenez, MD

## 2023-09-03 ENCOUNTER — Other Ambulatory Visit: Payer: Self-pay | Admitting: Psychiatry

## 2023-09-03 DIAGNOSIS — F4001 Agoraphobia with panic disorder: Secondary | ICD-10-CM

## 2023-09-03 DIAGNOSIS — F330 Major depressive disorder, recurrent, mild: Secondary | ICD-10-CM

## 2023-09-21 ENCOUNTER — Inpatient Hospital Stay: Payer: Medicare Other | Attending: Hematology & Oncology

## 2023-09-21 VITALS — BP 145/76 | HR 99 | Temp 98.4°F | Resp 20

## 2023-09-21 DIAGNOSIS — Z452 Encounter for adjustment and management of vascular access device: Secondary | ICD-10-CM | POA: Diagnosis present

## 2023-09-21 DIAGNOSIS — D509 Iron deficiency anemia, unspecified: Secondary | ICD-10-CM

## 2023-09-21 DIAGNOSIS — C8800 Waldenstrom macroglobulinemia not having achieved remission: Secondary | ICD-10-CM | POA: Insufficient documentation

## 2023-09-21 MED ORDER — SODIUM CHLORIDE 0.9% FLUSH
10.0000 mL | INTRAVENOUS | Status: DC | PRN
  Administered 2023-09-21: 10 mL via INTRAVENOUS

## 2023-09-21 MED ORDER — HEPARIN SOD (PORK) LOCK FLUSH 100 UNIT/ML IV SOLN
500.0000 [IU] | Freq: Once | INTRAVENOUS | Status: AC
  Administered 2023-09-21: 500 [IU] via INTRAVENOUS

## 2023-09-21 NOTE — Patient Instructions (Signed)

## 2023-10-18 ENCOUNTER — Ambulatory Visit: Admitting: Psychiatry

## 2023-10-18 ENCOUNTER — Encounter: Payer: Self-pay | Admitting: Psychiatry

## 2023-10-18 DIAGNOSIS — F330 Major depressive disorder, recurrent, mild: Secondary | ICD-10-CM

## 2023-10-18 DIAGNOSIS — F4001 Agoraphobia with panic disorder: Secondary | ICD-10-CM

## 2023-10-18 DIAGNOSIS — F5105 Insomnia due to other mental disorder: Secondary | ICD-10-CM

## 2023-10-18 MED ORDER — TRAZODONE HCL 150 MG PO TABS: 150.0000 mg | ORAL_TABLET | Freq: Every day | ORAL | 1 refills | Status: DC

## 2023-10-18 MED ORDER — DULOXETINE HCL 60 MG PO CPEP: 120.0000 mg | ORAL_CAPSULE | Freq: Every day | ORAL | 1 refills | Status: DC

## 2023-10-18 MED ORDER — CLONAZEPAM 0.5 MG PO TABS: 0.5000 mg | ORAL_TABLET | Freq: Two times a day (BID) | ORAL | 1 refills | Status: DC
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
## 2023-11-08 ENCOUNTER — Other Ambulatory Visit: Payer: Self-pay | Admitting: Psychiatry

## 2023-11-08 DIAGNOSIS — F330 Major depressive disorder, recurrent, mild: Secondary | ICD-10-CM

## 2023-11-08 DIAGNOSIS — F4001 Agoraphobia with panic disorder: Secondary | ICD-10-CM

## 2023-11-22 ENCOUNTER — Encounter: Payer: Self-pay | Admitting: Hematology & Oncology

## 2023-11-22 ENCOUNTER — Inpatient Hospital Stay: Payer: Medicare Other

## 2023-11-22 ENCOUNTER — Other Ambulatory Visit: Payer: Self-pay

## 2023-11-22 ENCOUNTER — Inpatient Hospital Stay: Payer: Medicare Other | Admitting: Hematology & Oncology

## 2023-11-22 ENCOUNTER — Inpatient Hospital Stay: Payer: Medicare Other | Attending: Hematology & Oncology

## 2023-11-22 VITALS — BP 104/85 | HR 67 | Temp 98.1°F | Resp 18 | Ht 66.0 in | Wt 147.0 lb

## 2023-11-22 DIAGNOSIS — Z800 Family history of malignant neoplasm of digestive organs: Secondary | ICD-10-CM | POA: Insufficient documentation

## 2023-11-22 DIAGNOSIS — Z7189 Other specified counseling: Secondary | ICD-10-CM | POA: Diagnosis not present

## 2023-11-22 DIAGNOSIS — F17210 Nicotine dependence, cigarettes, uncomplicated: Secondary | ICD-10-CM | POA: Diagnosis not present

## 2023-11-22 DIAGNOSIS — D631 Anemia in chronic kidney disease: Secondary | ICD-10-CM | POA: Diagnosis not present

## 2023-11-22 DIAGNOSIS — C8800 Waldenstrom macroglobulinemia not having achieved remission: Secondary | ICD-10-CM | POA: Diagnosis present

## 2023-11-22 DIAGNOSIS — N189 Chronic kidney disease, unspecified: Secondary | ICD-10-CM | POA: Diagnosis not present

## 2023-11-22 LAB — CBC WITH DIFFERENTIAL (CANCER CENTER ONLY)
Abs Immature Granulocytes: 0.21 K/uL — ABNORMAL HIGH (ref 0.00–0.07)
Basophils Absolute: 0 K/uL (ref 0.0–0.1)
Basophils Relative: 0 %
Eosinophils Absolute: 0 K/uL (ref 0.0–0.5)
Eosinophils Relative: 0 %
HCT: 37.7 % (ref 36.0–46.0)
Hemoglobin: 12.5 g/dL (ref 12.0–15.0)
Immature Granulocytes: 4 %
Lymphocytes Relative: 30 %
Lymphs Abs: 1.6 K/uL (ref 0.7–4.0)
MCH: 33.1 pg (ref 26.0–34.0)
MCHC: 33.2 g/dL (ref 30.0–36.0)
MCV: 99.7 fL (ref 80.0–100.0)
Monocytes Absolute: 0.4 K/uL (ref 0.1–1.0)
Monocytes Relative: 7 %
Neutro Abs: 3.1 K/uL (ref 1.7–7.7)
Neutrophils Relative %: 59 %
Platelet Count: 230 K/uL (ref 150–400)
RBC: 3.78 MIL/uL — ABNORMAL LOW (ref 3.87–5.11)
RDW: 13.2 % (ref 11.5–15.5)
WBC Count: 5.3 K/uL (ref 4.0–10.5)
nRBC: 0 % (ref 0.0–0.2)

## 2023-11-22 LAB — CMP (CANCER CENTER ONLY)
ALT: 11 U/L (ref 0–44)
AST: 18 U/L (ref 15–41)
Albumin: 4.4 g/dL (ref 3.5–5.0)
Alkaline Phosphatase: 70 U/L (ref 38–126)
Anion gap: 11 (ref 5–15)
BUN: 18 mg/dL (ref 8–23)
CO2: 21 mmol/L — ABNORMAL LOW (ref 22–32)
Calcium: 9.5 mg/dL (ref 8.9–10.3)
Chloride: 109 mmol/L (ref 98–111)
Creatinine: 1.04 mg/dL — ABNORMAL HIGH (ref 0.44–1.00)
GFR, Estimated: 56 mL/min — ABNORMAL LOW (ref >60)
Glucose, Bld: 97 mg/dL (ref 70–99)
Potassium: 4.1 mmol/L (ref 3.5–5.1)
Sodium: 142 mmol/L (ref 135–145)
Total Bilirubin: 0.2 mg/dL (ref 0.0–1.2)
Total Protein: 6 g/dL — ABNORMAL LOW (ref 6.5–8.1)

## 2023-11-22 LAB — LACTATE DEHYDROGENASE: LDH: 191 U/L (ref 98–192)

## 2023-11-22 NOTE — Progress Notes (Signed)
 Hematology and Oncology Follow Up Visit  Mikisha Rumery [id number] 1951-04-08 73 y.o. 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 Orban is here today for follow-up.  6 months ago.  Since then, she been doing pretty well.  As always, she goes down to [place]  I think in April.  She had a good time down in [place] .  Her older sister was recent diagnosed with pancreatic cancer.  She lives in [place].  As such, Ms. Deupree Sain in the area to try to help her out.  She has had no problem with infections.  She is still smoking.  She smokes about half pack a day.  As far as her monoclonal studies go, there is no monoclonal spike in her blood.  Her last IgM level was 67 mg/dL.  Overall, I would have to say that her performance status about ECOG 1.    Medications:  Allergies as of 11/22/2023       Reactions   Codeine Nausea And Vomiting        Medication List        Accurate as of November 22, 2023  9:46 AM. If you have any questions, ask your nurse or doctor.          Acetaminophen  Extra Strength 500 MG Caps Take 1,000 mg by mouth every 6 (six) hours as needed for pain.   albuterol  108 (90 Base) MCG/ACT inhaler Commonly known as: VENTOLIN  HFA Inhale 1-2 puffs into the lungs every 4 (four) hours as needed.   carvedilol  12.5 MG tablet Commonly known as: COREG  TAKE 1 TABLET (12.5MG  TOTAL) BY MOUTH TWICE A DAY WITH MEALS   clonazePAM  0.5 MG tablet Commonly known as: KLONOPIN  Take 1 tablet (0.5 mg total) by mouth 2 (two) times daily.   DULoxetine  60 MG capsule Commonly known as: CYMBALTA  Take 2 capsules (120 mg total) by mouth daily.    lidocaine -prilocaine  cream Commonly known as: EMLA  Apply 1 application topically as needed. Apply to [facility] 1 hour before access   omeprazole 10 MG capsule Commonly known as: PRILOSEC Take 10 mg by mouth daily.   traZODone  150 MG tablet Commonly known as: DESYREL  Take 1 tablet (150 mg total) by mouth at bedtime.   Trelegy Ellipta  200-62.5-25 MCG/ACT Aepb Generic drug: Fluticasone-Umeclidin-Vilant Inhale 1 puff into the lungs daily at 2 PM.   triamcinolone ointment 0.1 % Commonly known as: KENALOG Apply 1 application topically daily as needed (eczema).   varenicline 1 MG tablet Commonly known as: CHANTIX Take 1 mg by mouth 2 (two) times daily.        Allergies:  Allergies  Allergen Reactions   Codeine Nausea And Vomiting    Past Medical History, Surgical history, Social history, and Family History were reviewed and updated.  Review of Systems: Review of Systems  Constitutional: Negative.   HENT: Negative.    Eyes: Negative.   Respiratory: Negative.    Cardiovascular: Negative.   Gastrointestinal: Negative.  Negative for melena.  Genitourinary: Negative.   Musculoskeletal: Negative.   Skin: Negative.   Neurological: Negative.   Endo/Heme/Allergies: Negative.   Psychiatric/Behavioral: Negative.       Physical Exam:  height is 5' 6 (  1.676 m) and weight is 147 lb (66.7 kg). Her oral temperature is 98.1 F (36.7 C). Her blood pressure is 104/85 and her pulse is 67. Her respiration is 18 and oxygen saturation is 100%.   Wt Readings from Last 3 Encounters:  11/22/23 147 lb (66.7 kg)  08/24/23 142 lb 4 oz (64.5 kg)  06/01/23 143 lb (64.9 kg)    Physical Exam Vitals reviewed.  HENT:     Head: Normocephalic and atraumatic.  Eyes:     Pupils: Pupils are equal, round, and reactive to light.  Cardiovascular:     Rate and Rhythm: Normal rate and regular rhythm.     Heart sounds: Normal heart sounds.  Pulmonary:     Effort: Pulmonary effort is normal.      Breath sounds: Normal breath sounds.  Abdominal:     General: Bowel sounds are normal.     Palpations: Abdomen is soft.  Musculoskeletal:        General: No tenderness or deformity. Normal range of motion.     Cervical back: Normal range of motion.  Lymphadenopathy:     Cervical: No cervical adenopathy.  Skin:    General: Skin is warm and dry.     Findings: No erythema or rash.  Neurological:     Mental Status: She is alert and oriented to person, place, and time.  Psychiatric:        Behavior: Behavior normal.        Thought Content: Thought content normal.        Judgment: Judgment normal.      Lab Results  Component Value Date   WBC 5.3 11/22/2023   HGB 12.5 11/22/2023   HCT 37.7 11/22/2023   MCV 99.7 11/22/2023   PLT 230 11/22/2023   Lab Results  Component Value Date   FERRITIN 68 06/01/2023   IRON 134 06/01/2023   TIBC 381 06/01/2023   UIBC 247 06/01/2023   IRONPCTSAT 35 (H) 06/01/2023   Lab Results  Component Value Date   RETICCTPCT 2.2 12/25/2019   RBC 3.78 (L) 11/22/2023   Lab Results  Component Value Date   KPAFRELGTCHN 12.1 06/01/2023   LAMBDASER 10.0 06/01/2023   KAPLAMBRATIO 1.21 06/01/2023   Lab Results  Component Value Date   IGGSERUM 264 (L) 06/01/2023   IGA 61 (L) 06/01/2023   IGMSERUM 67 06/01/2023   Lab Results  Component Value Date   TOTALPROTELP 5.8 (L) 06/01/2023   ALBUMINELP 3.6 06/01/2023   A1GS 0.2 06/01/2023   A2GS 0.7 06/01/2023   BETS 1.0 06/01/2023   GAMS 0.2 (L) 06/01/2023   MSPIKE Not Observed 06/01/2023   SPEI Comment 12/25/2019     Chemistry      Component Value Date/Time   NA 142 06/01/2023 1022   K 3.9 06/01/2023 1022   CL 107 06/01/2023 1022   CO2 26 06/01/2023 1022   BUN 20 06/01/2023 1022   CREATININE 1.05 (H) 06/01/2023 1022      Component Value Date/Time   CALCIUM  9.4 06/01/2023 1022   ALKPHOS 57 06/01/2023 1022   AST 16 06/01/2023 1022   ALT 11 06/01/2023 1022   BILITOT 0.4 06/01/2023 1022        Impression and Plan: Ms. Berenguer is a very pleasant 73yo caucasian female with Waldenstrm's macroglobulinemia. She responded well to Rituxan . Initially she was on Rituxan  with Cytoxan  but she could not tolerate the Cytoxan  with her blood counts going down.    Everything looks quite well.  Is now been 3 years since she had completed the maintenance Rituxan .  Of note, she has not had a mammogram for over a year.  She really needs to have one done.  As always, we will continue to follow her along.  Will plan to see her back in 6 months.    Maude JONELLE Crease, MD 8/5/20259:46 AM

## 2023-11-23 LAB — IGG, IGA, IGM
IgA: 64 mg/dL (ref 64–422)
IgG (Immunoglobin G), Serum: 282 mg/dL — ABNORMAL LOW (ref 586–1602)
IgM (Immunoglobulin M), Srm: 71 mg/dL (ref 26–217)

## 2023-11-23 LAB — KAPPA/LAMBDA LIGHT CHAINS
Kappa free light chain: 12.1 mg/L (ref 3.3–19.4)
Kappa, lambda light chain ratio: 1 (ref 0.26–1.65)
Lambda free light chains: 12.1 mg/L (ref 5.7–26.3)

## 2023-11-24 LAB — PROTEIN ELECTROPHORESIS, SERUM
A/G Ratio: 1.9 — ABNORMAL HIGH (ref 0.7–1.7)
Albumin ELP: 3.7 g/dL (ref 2.9–4.4)
Alpha-1-Globulin: 0.2 g/dL (ref 0.0–0.4)
Alpha-2-Globulin: 0.8 g/dL (ref 0.4–1.0)
Beta Globulin: 0.7 g/dL (ref 0.7–1.3)
Gamma Globulin: 0.2 g/dL — ABNORMAL LOW (ref 0.4–1.8)
Globulin, Total: 1.9 g/dL — ABNORMAL LOW (ref 2.2–3.9)
Total Protein ELP: 5.6 g/dL — ABNORMAL LOW (ref 6.0–8.5)

## 2023-12-12 ENCOUNTER — Ambulatory Visit (HOSPITAL_BASED_OUTPATIENT_CLINIC_OR_DEPARTMENT_OTHER)

## 2023-12-26 ENCOUNTER — Ambulatory Visit (HOSPITAL_BASED_OUTPATIENT_CLINIC_OR_DEPARTMENT_OTHER)
Admission: RE | Admit: 2023-12-26 | Discharge: 2023-12-26 | Disposition: A | Discharge status: Home / Self Care | Source: Ambulatory Visit | Attending: Hematology & Oncology | Admitting: Hematology & Oncology

## 2023-12-26 ENCOUNTER — Encounter (HOSPITAL_BASED_OUTPATIENT_CLINIC_OR_DEPARTMENT_OTHER): Payer: Self-pay

## 2023-12-26 DIAGNOSIS — Z7189 Other specified counseling: Secondary | ICD-10-CM | POA: Diagnosis present

## 2023-12-26 DIAGNOSIS — Z1231 Encounter for screening mammogram for malignant neoplasm of breast: Secondary | ICD-10-CM | POA: Diagnosis present

## 2024-02-20 ENCOUNTER — Other Ambulatory Visit

## 2024-02-21 ENCOUNTER — Ambulatory Visit: Admitting: Psychiatry

## 2024-02-22 ENCOUNTER — Other Ambulatory Visit

## 2024-02-22 DIAGNOSIS — N3281 Overactive bladder: Secondary | ICD-10-CM

## 2024-02-22 LAB — MICROSCOPIC EXAMINATION: WBC, UA: >30 /HPF — AB (ref 0–5)

## 2024-02-22 LAB — URINALYSIS, COMPLETE
Bilirubin, UA: NEGATIVE
Glucose, UA: NEGATIVE
Ketones, UA: NEGATIVE
Nitrite, UA: NEGATIVE
Protein,UA: NEGATIVE
RBC, UA: NEGATIVE
Specific Gravity, UA: 1.01 (ref 1.005–1.030)
Urobilinogen, Ur: 0.2 mg/dL (ref 0.2–1.0)
pH, UA: 6.5 (ref 5.0–7.5)

## 2024-03-02 ENCOUNTER — Telehealth: Payer: Self-pay

## 2024-03-02 DIAGNOSIS — N3281 Overactive bladder: Secondary | ICD-10-CM

## 2024-03-02 DIAGNOSIS — Z792 Long term (current) use of antibiotics: Secondary | ICD-10-CM

## 2024-03-02 MED ORDER — CIPROFLOXACIN HCL 500 MG PO TABS: 500.0000 mg | ORAL_TABLET | Freq: Every day | ORAL | 0 refills | Status: DC

## 2024-03-02 NOTE — Telephone Encounter (Signed)
 Called pt to let them know that Cipro  will be sent to their pharmacy, this is to be taken, the day before the procedure, the day of the procedure and the day after the procedure. Also, to inform them to please come 30 minutes early to their appointment so that we can numb their bladder with a local anesthetic and wait for it to take effect. Pt voiced understanding.

## 2024-03-03 LAB — CULTURE, URINE COMPREHENSIVE

## 2024-03-05 ENCOUNTER — Ambulatory Visit: Payer: Self-pay

## 2024-03-05 ENCOUNTER — Ambulatory Visit: Admitting: Urology

## 2024-03-05 MED ORDER — CIPROFLOXACIN HCL 250 MG PO TABS: 250.0000 mg | ORAL_TABLET | Freq: Two times a day (BID) | ORAL | 0 refills | Status: AC

## 2024-03-05 NOTE — Addendum Note (Signed)
 Addended byBETHA CORIE PLATER on: 03/05/2024 10:48 AM   Modules accepted: Orders

## 2024-03-12 ENCOUNTER — Ambulatory Visit: Admitting: Urology

## 2024-03-12 VITALS — BP 126/80 | HR 84 | Ht 65.0 in | Wt 145.0 lb

## 2024-03-12 DIAGNOSIS — N3281 Overactive bladder: Secondary | ICD-10-CM

## 2024-03-12 MED ORDER — ONABOTULINUMTOXINA 100 UNITS IJ SOLR
100.0000 [IU] | Freq: Once | INTRAMUSCULAR | Status: AC
  Administered 2024-03-12: 100 [IU] via INTRAMUSCULAR

## 2024-03-12 NOTE — Progress Notes (Signed)
 Bladder Instillation  Due to Botox  Injection patient is present today for a Bladder Instillation of Botox . Patient was cleaned and prepped in a sterile fashion with betadine and lidocaine  2% was instilled into the urethra.  A 14FR catheter was inserted, urine return was noted 20ml, urine was yellow in color.  60 ml was instilled into the bladder. The catheter was then removed. Patient tolerated well, no complications were noted. Patient held in bladder for 30 minutes prior to procedure starting.   Performed by: Beauford Browner, CCMA  Follow up/ Additional notes: 2 week follow-up with PA

## 2024-03-12 NOTE — Progress Notes (Signed)
 03/12/2024 10:23 AM   Tiffany Leon Jun 11, 1950 [id number]  Referring provider: Mona Vinie BROCKS, MD [address] [place],  [place] [address]  Chief Complaint  Patient presents with   Follow-up    HPI: Patient of Dr. Bjorn was get regular Botox .  She is on oxybutynin .  She has chronic renal insufficiency.  She has a history of congestive heart failure and COPD she is known to have a 6 mm stone in the right kidney.  Urine culture was positive but treated prior to the procedure  At baseline patient wears 3 pads a day that can be soaked.  She says with Botox  she becomes almost completely dry.  She was not having symptoms when she had a positive culture.  Clinically not infected today  Patient underwent Botox .  Bladder mucosa and trigone were normal.  No cystitis.  No carcinoma.  I used my modified template with 100s of Botox  and 10 cc of normal saline.  She was bit tender but overall did well.  She tends to be tender she said but can tolerate it.  Scope was little bit tender with insertion and she was a bit nervous but did very well  She would like to follow-up in [facility]   PMH: Past Medical History:  Diagnosis Date   Acute kidney injury    a. 09/2018   Anxiety    Aortic atherosclerosis    a. 09/2018 noted on Chest CT.   Chronic combined systolic (congestive) and diastolic (congestive) heart failure (HCC)    a.) 09/2018 Echo: EF 45-50%, diff HK. Triv to small circumfirential pericardial eff. b.) TTE 02/14/2020: EF 60-65%; no RWMAs; G1DD; GLS -23.3%   Chronic DOE (dyspnea on exertion)    Claudication    a. Bilat hip claudication since ~ 2019.   COPD (chronic obstructive pulmonary disease) (HCC)    Coronary artery calcification seen on CT scan 09/20/2018   Depression    Dyspnea    Emphysema lung (HCC)    Exertional angina    a. Ex angina since ~ 2019.   GERD (gastroesophageal reflux disease)    Goals of care, counseling/discussion 11/03/2018   Hiatal hernia    History  of 2019 novel coronavirus disease (COVID-19) 01/16/2021   History of bronchitis    History of kidney stones    Hypothyroidism    Pleural effusion    a. 09/2018 s/p thoracentesis-->944ml.   Resting tremor    Tobacco abuse    Waldenstrom's macroglobulinemia 11/03/2018    Surgical History: Past Surgical History:  Procedure Laterality Date   BOTOX  INJECTION N/A 05/25/2021   Procedure: BOTOX  INJECTION;  Surgeon: Penne Knee, MD;  Location: [facility] ORS;  Service: Urology;  Laterality: N/A;   BREAST LUMPECTOMY WITH RADIOACTIVE SEED LOCALIZATION Left 12/28/2017   Procedure: BREAST LUMPECTOMY WITH RADIOACTIVE SEED LOCALIZATION;  Surgeon: Curvin Deward MOULD, MD;  Location: [facility] OR;  Service: General;  Laterality: Left;   BREAST SURGERY     CYSTOSCOPY/URETEROSCOPY/HOLMIUM LASER/STENT PLACEMENT Right 05/25/2021   Procedure: CYSTOSCOPY/URETEROSCOPY/HOLMIUM LASER/STENT PLACEMENT;  Surgeon: Penne Knee, MD;  Location: [facility] ORS;  Service: Urology;  Laterality: Right;   DG THUMB RIGHT HAND ([facility] HX)     IR IMAGING GUIDED PORT INSERTION  11/09/2018   IR THORACENTESIS ASP PLEURAL SPACE W/IMG GUIDE  09/20/2018   KIDNEY STONE SURGERY      Home Medications:  Allergies as of 03/12/2024       Reactions   Codeine Nausea And Vomiting  Medication List        Accurate as of March 12, 2024 10:23 AM. If you have any questions, ask your nurse or doctor.          STOP taking these medications    Trelegy Ellipta  200-62.5-25 MCG/ACT Aepb Generic drug: Fluticasone-Umeclidin-Vilant   varenicline 1 MG tablet Commonly known as: CHANTIX       TAKE these medications    Acetaminophen  Extra Strength 500 MG Caps Take 1,000 mg by mouth every 6 (six) hours as needed for pain.   albuterol  108 (90 Base) MCG/ACT inhaler Commonly known as: VENTOLIN  HFA Inhale 1-2 puffs into the lungs every 4 (four) hours as needed.   carvedilol  12.5 MG tablet Commonly known as: COREG  TAKE 1 TABLET (12.5MG   TOTAL) BY MOUTH TWICE A DAY WITH MEALS   ciprofloxacin  250 MG tablet Commonly known as: Cipro  Take 1 tablet (250 mg total) by mouth 2 (two) times daily for 7 days.   clonazePAM  0.5 MG tablet Commonly known as: KLONOPIN  Take 1 tablet (0.5 mg total) by mouth 2 (two) times daily.   DULoxetine  60 MG capsule Commonly known as: CYMBALTA  Take 2 capsules (120 mg total) by mouth daily.   lidocaine -prilocaine  cream Commonly known as: EMLA  Apply 1 application topically as needed. Apply to [facility] 1 hour before access   omeprazole 10 MG capsule Commonly known as: PRILOSEC Take 10 mg by mouth daily.   traZODone  150 MG tablet Commonly known as: DESYREL  Take 1 tablet (150 mg total) by mouth at bedtime.   triamcinolone ointment 0.1 % Commonly known as: KENALOG Apply 1 application topically daily as needed (eczema).        Allergies:  Allergies  Allergen Reactions   Codeine Nausea And Vomiting    Family History: Family History  Problem Relation Age of Onset   Stroke Mother        Mini-strokes. Died @ 72.   Dementia Mother    Lung cancer Father        Died in his 8's   Addison's disease Sister    Hypertension Brother    CAD Brother    Hypertension Brother     Social History:  reports that she has been smoking cigarettes. She has a 40 pack-year smoking history. She has never used smokeless tobacco. She reports that she does not currently use alcohol. She reports that she does not currently use drugs.  ROS:                                        Physical Exam: BP 126/80 (BP Location: Left Arm, Patient Position: Sitting, Cuff Size: Normal)   Pulse 84   Ht 5' 5 (1.651 m)   Wt 65.8 kg   SpO2 94%   BMI 24.13 kg/m   Constitutional:  Alert and oriented, No acute distress. HEENT: [place] AT, moist mucus membranes.  Trachea midline, no masses.  Laboratory Data: Lab Results  Component Value Date   WBC 5.3 11/22/2023   HGB 12.5 11/22/2023   HCT 37.7  11/22/2023   MCV 99.7 11/22/2023   PLT 230 11/22/2023    Lab Results  Component Value Date   CREATININE 1.04 (H) 11/22/2023    No results found for: PSA  No results found for: TESTOSTERONE  Lab Results  Component Value Date   HGBA1C 4.6 (L) 09/28/2018    Urinalysis    Component  Value Date/Time   COLORURINE AMBER (A) 01/16/2021 2121   APPEARANCEUR Cloudy (A) 02/22/2024 0822   LABSPEC 1.026 01/16/2021 2121   PHURINE 5.0 01/16/2021 2121   GLUCOSEU Negative 02/22/2024 0822   HGBUR SMALL (A) 01/16/2021 2121   BILIRUBINUR Negative 02/22/2024 0822   KETONESUR NEGATIVE 01/16/2021 2121   PROTEINUR Negative 02/22/2024 0822   PROTEINUR 100 (A) 01/16/2021 2121   NITRITE Negative 02/22/2024 0822   NITRITE POSITIVE (A) 01/16/2021 2121   LEUKOCYTESUR 2+ (A) 02/22/2024 0822   LEUKOCYTESUR LARGE (A) 01/16/2021 2121    Pertinent Imaging:   Assessment & Plan: Patient will call in a few months and I will see her in [place] to establish care and then we can continue with Botox .  This is her 3rd or 4th treatment.  1 did not work in the past but it may have been due to a kidney stone  1. OAB (overactive bladder) (Primary)  - botulinum toxin Type A  (BOTOX ) injection 100 Units   No follow-ups on file.  Glendia DELENA Elizabeth, MD  [facility] [address], [address] [place], [place] [address] [phone]

## 2024-03-27 ENCOUNTER — Ambulatory Visit: Admitting: Physician Assistant

## 2024-04-03 ENCOUNTER — Ambulatory Visit: Admitting: Physician Assistant

## 2024-04-03 VITALS — BP 120/82 | HR 100 | Ht 61.0 in | Wt 150.0 lb

## 2024-04-03 DIAGNOSIS — N3281 Overactive bladder: Secondary | ICD-10-CM

## 2024-04-03 LAB — URINALYSIS, COMPLETE
Bilirubin, UA: NEGATIVE
Glucose, UA: NEGATIVE
Ketones, UA: NEGATIVE
Nitrite, UA: NEGATIVE
RBC, UA: NEGATIVE
Specific Gravity, UA: 1.02 (ref 1.005–1.030)
Urobilinogen, Ur: 0.2 mg/dL (ref 0.2–1.0)
pH, UA: 6 (ref 5.0–7.5)

## 2024-04-03 LAB — MICROSCOPIC EXAMINATION: Epithelial Cells (non renal): >10 /HPF — AB (ref 0–10)

## 2024-04-03 LAB — BLADDER SCAN AMB NON-IMAGING

## 2024-04-03 NOTE — Progress Notes (Signed)
 04/03/2024 11:00 AM   Tiffany Leon Aug 30, 1950 [id number]  CC: Chief Complaint  Patient presents with   Follow-up   Over Active Bladder   HPI: Tiffany Leon is a 73 y.o. female with PMH OAB wet managed with intravesical Botox , right renal pelvic stone, and UTIs who underwent intravesical Botox  with Dr. Gaston 3 weeks ago who presents today for follow-up.   Today she reports she is dry with intravesical Botox , having previously used overnight depends 24/7.  She is extremely pleased and wishes to continue treatments every 6 months.  She denies dysuria.  [facility] [place], but on further reflection would like to stay with our practice at least for now.  In-office UA today positive for trace protein and 1+ leukocytes; urine microscopy with 11-30 WBCs/HPF, >10 epithelial cells/hpf, and many bacteria.  PVR 37 mL.   PMH: Past Medical History:  Diagnosis Date   Acute kidney injury    a. 09/2018   Anxiety    Aortic atherosclerosis    a. 09/2018 noted on Chest CT.   Chronic combined systolic (congestive) and diastolic (congestive) heart failure (HCC)    a.) 09/2018 Echo: EF 45-50%, diff HK. Triv to small circumfirential pericardial eff. b.) TTE 02/14/2020: EF 60-65%; no RWMAs; G1DD; GLS -23.3%   Chronic DOE (dyspnea on exertion)    Claudication    a. Bilat hip claudication since ~ 2019.   COPD (chronic obstructive pulmonary disease) (HCC)    Coronary artery calcification seen on CT scan 09/20/2018   Depression    Dyspnea    Emphysema lung (HCC)    Exertional angina    a. Ex angina since ~ 2019.   GERD (gastroesophageal reflux disease)    Goals of care, counseling/discussion 11/03/2018   Hiatal hernia    History of 2019 novel coronavirus disease (COVID-19) 01/16/2021   History of bronchitis    History of kidney stones    Hypothyroidism    Pleural effusion    a. 09/2018 s/p thoracentesis-->966ml.   Resting tremor    Tobacco  abuse    Waldenstrom's macroglobulinemia 11/03/2018    Surgical History: Past Surgical History:  Procedure Laterality Date   BOTOX  INJECTION N/A 05/25/2021   Procedure: BOTOX  INJECTION;  Surgeon: Penne Knee, MD;  Location: [facility] ORS;  Service: Urology;  Laterality: N/A;   BREAST LUMPECTOMY WITH RADIOACTIVE SEED LOCALIZATION Left 12/28/2017   Procedure: BREAST LUMPECTOMY WITH RADIOACTIVE SEED LOCALIZATION;  Surgeon: Curvin Deward MOULD, MD;  Location: [facility] OR;  Service: General;  Laterality: Left;   BREAST SURGERY     CYSTOSCOPY/URETEROSCOPY/HOLMIUM LASER/STENT PLACEMENT Right 05/25/2021   Procedure: CYSTOSCOPY/URETEROSCOPY/HOLMIUM LASER/STENT PLACEMENT;  Surgeon: Penne Knee, MD;  Location: [facility] ORS;  Service: Urology;  Laterality: Right;   DG THUMB RIGHT HAND ([facility] HX)     IR IMAGING GUIDED PORT INSERTION  11/09/2018   IR THORACENTESIS ASP PLEURAL SPACE W/IMG GUIDE  09/20/2018   KIDNEY STONE SURGERY      Home Medications:  Allergies as of 04/03/2024       Reactions   Codeine Nausea And Vomiting        Medication List        Accurate as of April 03, 2024 11:00 AM. If you have any questions, ask your nurse or doctor.          Acetaminophen  Extra Strength 500 MG Caps Take 1,000 mg by mouth every 6 (six) hours as needed for pain.   albuterol  108 (90 Base) MCG/ACT  inhaler Commonly known as: VENTOLIN  HFA Inhale 1-2 puffs into the lungs every 4 (four) hours as needed.   carvedilol  12.5 MG tablet Commonly known as: COREG  TAKE 1 TABLET (12.5MG  TOTAL) BY MOUTH TWICE A DAY WITH MEALS   clonazePAM  0.5 MG tablet Commonly known as: KLONOPIN  Take 1 tablet (0.5 mg total) by mouth 2 (two) times daily.   DULoxetine  60 MG capsule Commonly known as: CYMBALTA  Take 2 capsules (120 mg total) by mouth daily.   lidocaine -prilocaine  cream Commonly known as: EMLA  Apply 1 application topically as needed. Apply to [facility] 1 hour before access   omeprazole 10 MG capsule Commonly known  as: PRILOSEC Take 10 mg by mouth daily.   traZODone  150 MG tablet Commonly known as: DESYREL  Take 1 tablet (150 mg total) by mouth at bedtime.   triamcinolone ointment 0.1 % Commonly known as: KENALOG Apply 1 application topically daily as needed (eczema).        Allergies:  Allergies[1]  Family History: Family History  Problem Relation Age of Onset   Stroke Mother        Mini-strokes. Died @ 50.   Dementia Mother    Lung cancer Father        Died in his 74's   Addison's disease Sister    Hypertension Brother    CAD Brother    Hypertension Brother     Social History:   reports that she has been smoking cigarettes. She has a 40 pack-year smoking history. She has never used smokeless tobacco. She reports that she does not currently use alcohol. She reports that she does not currently use drugs.  Physical Exam: BP 120/82 (BP Location: Left Arm, Patient Position: Sitting, Cuff Size: Large)   Pulse 100   Ht 5' 1 (1.549 m)   Wt 150 lb (68 kg)   SpO2 96%   BMI 28.34 kg/m   Constitutional:  Alert and oriented, no acute distress, nontoxic appearing HEENT: [place], AT Cardiovascular: No clubbing, cyanosis, or edema Respiratory: Normal respiratory effort, no increased work of breathing Skin: No rashes, bruises or suspicious lesions Neurologic: Grossly intact, no focal deficits, moving all 4 extremities Psychiatric: Normal mood and affect  Laboratory Data: Results for orders placed or performed in visit on 04/03/24  Microscopic Examination   Collection Time: 04/03/24 10:24 AM   Urine  Result Value Ref Range   WBC, UA WILL FOLLOW    RBC, Urine WILL FOLLOW    Epithelial Cells (non renal) WILL FOLLOW    Renal Epithel, UA WILL FOLLOW    Casts WILL FOLLOW    Cast Type WILL FOLLOW    Crystals WILL FOLLOW    Crystal Type WILL FOLLOW    Mucus, UA WILL FOLLOW    Bacteria, UA WILL FOLLOW    Yeast, UA WILL FOLLOW    Trichomonas, UA WILL FOLLOW    Urinalysis Comments WILL  FOLLOW   Urinalysis, Complete   Collection Time: 04/03/24 10:24 AM  Result Value Ref Range   Specific Gravity, UA 1.020 1.005 - 1.030   pH, UA 6.0 5.0 - 7.5   Color, UA Yellow Yellow   Appearance Ur Slightly cloudy Clear   Leukocytes,UA 1+ (A) Negative   Protein,UA Trace Negative/Trace   Glucose, UA Negative Negative   Ketones, UA Negative Negative   RBC, UA Negative Negative   Bilirubin, UA Negative Negative   Urobilinogen, Ur 0.2 0.2 - 1.0 mg/dL   Nitrite, UA Negative Negative   Microscopic Examination See below:  Bladder Scan (Post Void Residual) in office   Collection Time: 04/03/24 10:29 AM  Result Value Ref Range   Scan Result 37ml    Assessment & Plan:   1. OAB (overactive bladder) (Primary) Try with intravesical Botox .  UA is contaminated and she is not clinically infected today.  Will send for culture in case she develops symptoms.  She is emptying appropriately.  Will get her set up for her next treatment in 6 months here in [place]. - Bladder Scan (Post Void Residual) in office - Urinalysis, Complete - CULTURE, URINE COMPREHENSIVE   Return for Will call to schedule Botox .  Koby Hartfield, PA-C  [facility] [address] [place], [place] [address] [phone]     [1]  Allergies Allergen Reactions   Codeine Nausea And Vomiting

## 2024-04-06 ENCOUNTER — Ambulatory Visit: Payer: Self-pay | Admitting: Physician Assistant

## 2024-04-06 LAB — CULTURE, URINE COMPREHENSIVE

## 2024-04-13 ENCOUNTER — Other Ambulatory Visit: Payer: Self-pay | Admitting: Internal Medicine

## 2024-04-13 ENCOUNTER — Other Ambulatory Visit: Payer: Self-pay | Admitting: Psychiatry

## 2024-04-13 DIAGNOSIS — F5105 Insomnia due to other mental disorder: Secondary | ICD-10-CM

## 2024-04-13 DIAGNOSIS — F330 Major depressive disorder, recurrent, mild: Secondary | ICD-10-CM

## 2024-04-13 DIAGNOSIS — F4001 Agoraphobia with panic disorder: Secondary | ICD-10-CM

## 2024-04-17 ENCOUNTER — Ambulatory Visit: Admitting: Psychiatry

## 2024-04-23 ENCOUNTER — Encounter (HOSPITAL_COMMUNITY): Payer: Self-pay | Admitting: Emergency Medicine

## 2024-04-23 ENCOUNTER — Emergency Department (HOSPITAL_COMMUNITY)
Admission: EM | Admit: 2024-04-23 | Discharge: 2024-04-24 | Disposition: A | Discharge status: Home / Self Care | Source: Ambulatory Visit | Attending: Emergency Medicine | Admitting: Emergency Medicine

## 2024-04-23 ENCOUNTER — Emergency Department (HOSPITAL_COMMUNITY)

## 2024-04-23 ENCOUNTER — Other Ambulatory Visit: Payer: Self-pay

## 2024-04-23 DIAGNOSIS — E039 Hypothyroidism, unspecified: Secondary | ICD-10-CM | POA: Insufficient documentation

## 2024-04-23 DIAGNOSIS — F17200 Nicotine dependence, unspecified, uncomplicated: Secondary | ICD-10-CM | POA: Diagnosis not present

## 2024-04-23 DIAGNOSIS — J449 Chronic obstructive pulmonary disease, unspecified: Secondary | ICD-10-CM | POA: Diagnosis not present

## 2024-04-23 DIAGNOSIS — Z8616 Personal history of COVID-19: Secondary | ICD-10-CM | POA: Diagnosis not present

## 2024-04-23 DIAGNOSIS — R079 Chest pain, unspecified: Secondary | ICD-10-CM | POA: Diagnosis present

## 2024-04-23 DIAGNOSIS — I5042 Chronic combined systolic (congestive) and diastolic (congestive) heart failure: Secondary | ICD-10-CM | POA: Diagnosis not present

## 2024-04-23 DIAGNOSIS — I2693 Single subsegmental pulmonary embolism without acute cor pulmonale: Secondary | ICD-10-CM | POA: Diagnosis not present

## 2024-04-23 LAB — BASIC METABOLIC PANEL WITH GFR
Anion gap: 16 — ABNORMAL HIGH (ref 5–15)
BUN: 19 mg/dL (ref 8–23)
CO2: 17 mmol/L — ABNORMAL LOW (ref 22–32)
Calcium: 9.1 mg/dL (ref 8.9–10.3)
Chloride: 104 mmol/L (ref 98–111)
Creatinine, Ser: 0.9 mg/dL (ref 0.44–1.00)
GFR, Estimated: >60 mL/min
Glucose, Bld: 109 mg/dL — ABNORMAL HIGH (ref 70–99)
Potassium: 3.9 mmol/L (ref 3.5–5.1)
Sodium: 137 mmol/L (ref 135–145)

## 2024-04-23 LAB — D-DIMER, QUANTITATIVE: D-Dimer, Quant: 1.54 ug{FEU}/mL — ABNORMAL HIGH (ref 0.00–0.50)

## 2024-04-23 LAB — CBC
HCT: 39 % (ref 36.0–46.0)
Hemoglobin: 12.8 g/dL (ref 12.0–15.0)
MCH: 33.7 pg (ref 26.0–34.0)
MCHC: 32.8 g/dL (ref 30.0–36.0)
MCV: 102.6 fL — ABNORMAL HIGH (ref 80.0–100.0)
Platelets: 205 K/uL (ref 150–400)
RBC: 3.8 MIL/uL — ABNORMAL LOW (ref 3.87–5.11)
RDW: 13 % (ref 11.5–15.5)
WBC: 8.8 K/uL (ref 4.0–10.5)
nRBC: 0 % (ref 0.0–0.2)

## 2024-04-23 LAB — TROPONIN T, HIGH SENSITIVITY
Troponin T High Sensitivity: <15 ng/L (ref 0–19)
Troponin T High Sensitivity: <15 ng/L (ref 0–19)

## 2024-04-23 LAB — PROTIME-INR
INR: 0.9 (ref 0.8–1.2)
Prothrombin Time: 12.7 s (ref 11.4–15.2)

## 2024-04-23 MED ORDER — MORPHINE SULFATE (PF) 2 MG/ML IV SOLN
2.0000 mg | Freq: Once | INTRAVENOUS | Status: AC
  Administered 2024-04-23: 2 mg via INTRAVENOUS
  Filled 2024-04-23: qty 1

## 2024-04-23 MED ORDER — LIDOCAINE 5 % EX PTCH
1.0000 | MEDICATED_PATCH | Freq: Once | CUTANEOUS | Status: DC
  Administered 2024-04-23: 1 via TRANSDERMAL
  Filled 2024-04-23: qty 1

## 2024-04-23 MED ORDER — CYCLOBENZAPRINE HCL 10 MG PO TABS
5.0000 mg | ORAL_TABLET | Freq: Once | ORAL | Status: AC
  Administered 2024-04-23: 5 mg via ORAL
  Filled 2024-04-23: qty 1

## 2024-04-23 MED ORDER — ONDANSETRON HCL 4 MG/2ML IJ SOLN
4.0000 mg | Freq: Once | INTRAMUSCULAR | Status: AC
  Administered 2024-04-23: 4 mg via INTRAVENOUS
  Filled 2024-04-23: qty 2

## 2024-04-23 MED ORDER — IOHEXOL 350 MG/ML SOLN
75.0000 mL | Freq: Once | INTRAVENOUS | Status: AC | PRN
  Administered 2024-04-23: 75 mL via INTRAVENOUS

## 2024-04-23 MED ORDER — SODIUM CHLORIDE 0.9 % IV BOLUS
500.0000 mL | Freq: Once | INTRAVENOUS | Status: AC
  Administered 2024-04-23: 500 mL via INTRAVENOUS

## 2024-04-23 NOTE — ED Triage Notes (Signed)
 Pt c.o left sided shoulder pain that radiates to her left side of her chest since Friday night. Pt seen by here PCP and had a d dimer checked that was elevated at 1.37 so she was sent here to rule out a blood clot. Pt does c.o sob.

## 2024-04-23 NOTE — ED Notes (Signed)
 Patient transported to CT

## 2024-04-23 NOTE — Progress Notes (Signed)
 PHARMACY - ANTICOAGULATION CONSULT NOTE  Pharmacy Consult for eliquis  Indication: pulmonary embolus  Allergies[1]  Patient Measurements: Height: 5' 1 (154.9 cm) Weight: 69 kg (152 lb 1.9 oz) IBW/kg (Calculated) : 47.8 HEPARIN  DW (KG): 62.5  Vital Signs: Temp: 97.5 F (36.4 C) (01/05 1950) Temp Source: Oral (01/05 1950) BP: 116/73 (01/05 2315) Pulse Rate: 77 (01/05 2315)  Labs: Recent Labs    04/23/24 2021  HGB 12.8  HCT 39.0  PLT 205  LABPROT 12.7  INR 0.9  CREATININE 0.90    Estimated Creatinine Clearance: 48 mL/min (by C-G formula based on SCr of 0.9 mg/dL).   Medical History: Past Medical History:  Diagnosis Date   Acute kidney injury    a. 09/2018   Anxiety    Aortic atherosclerosis    a. 09/2018 noted on Chest CT.   Chronic combined systolic (congestive) and diastolic (congestive) heart failure (HCC)    a.) 09/2018 Echo: EF 45-50%, diff HK. Triv to small circumfirential pericardial eff. b.) TTE 02/14/2020: EF 60-65%; no RWMAs; G1DD; GLS -23.3%   Chronic DOE (dyspnea on exertion)    Claudication    a. Bilat hip claudication since ~ 2019.   COPD (chronic obstructive pulmonary disease) (HCC)    Coronary artery calcification seen on CT scan 09/20/2018   Depression    Dyspnea    Emphysema lung (HCC)    Exertional angina    a. Ex angina since ~ 2019.   GERD (gastroesophageal reflux disease)    Goals of care, counseling/discussion 11/03/2018   Hiatal hernia    History of 2019 novel coronavirus disease (COVID-19) 01/16/2021   History of bronchitis    History of kidney stones    Hypothyroidism    Pleural effusion    a. 09/2018 s/p thoracentesis-->93ml.   Resting tremor    Tobacco abuse    Waldenstrom's macroglobulinemia (HCC) 11/03/2018    Assessment: 42 yoF presented with left shoulder pain and abnormal d-dimer. Pharmacy consulted to dose eliquis  for new PE.  -CT: acute PE in LLL and subsegmental pulmonary arteries W/O RHS -d-dimer 1.54, CBC  WNL -No prior oral anticoagulation   Goal of Therapy:  Monitor platelets by anticoagulation protocol: Yes   Plan:  -Start eliquis  10mg  PO BID x7 days, then 5mg  PO BID -Follow up cost and education prior to patient discharge   Lynwood Poplar, PharmD, BCPS Clinical Pharmacist 04/24/2024 12:00 AM        [1]  Allergies Allergen Reactions   Codeine Nausea And Vomiting

## 2024-04-23 NOTE — ED Provider Notes (Signed)
 Care assumed at 2300.  Patient here with left-sided chest pain.  Care assumed pending CTA PE study.  CTA demonstrates a subsegmental left-sided pulmonary embolism.  Based off of COPD as she is higher risk for decompensation due to history of congestive heart failure, COPD, isolated vital sign measurements of blood pressure in the 90s and respirations in the 30s.  At bedside she is comfortable appearing and well-perfused.  She does have chronic shortness of breath and feels like this is at her baseline.  Discussed with patient option for admission for observation and patient prefers discharge home.  Discussed bleeding precautions for anticoagulation in the setting of pulmonary embolism as well as return precautions for progressive or new concerning symptoms.  Husband was at the bedside for these discussions.  Feel patient is stable for discharge with close outpatient follow-up as well as return precautions.   Griselda Norris, MD 04/24/24 [phone]

## 2024-04-23 NOTE — ED Provider Notes (Signed)
 " [place] [facility] Provider Note  CSN: [id number] Arrival date & time: 04/23/24 1945  Chief Complaint(s) Chest Pain  HPI Tiffany Leon is a 74 y.o. female with past medical history as below, significant for CHF, COPD, emphysema, who presents to the ED with complaint of left shoulder pain, abnormal D-dimer  Patient reports left shoulder pain that began a day ago, initially did improve with a heating pad.  Pain returned again this morning.  She went to see her primary care who ordered a D-dimer which resulted positive.  She came here for further evaluation.  Swelling some ongoing left shoulder pain.  Denies any sort of injury or fall recently.  No significant dyspnea.  Pain worsened with movement of her arm or torso.  No syncope, no nausea or vomiting, no change in bowel or bladder function, no change in p.o. intake.  No fevers.  History COPD, cough and breathing are essentially unchanged at this time per patient  Past Medical History Past Medical History:  Diagnosis Date   Acute kidney injury    a. 09/2018   Anxiety    Aortic atherosclerosis    a. 09/2018 noted on Chest CT.   Chronic combined systolic (congestive) and diastolic (congestive) heart failure (HCC)    a.) 09/2018 Echo: EF 45-50%, diff HK. Triv to small circumfirential pericardial eff. b.) TTE 02/14/2020: EF 60-65%; no RWMAs; G1DD; GLS -23.3%   Chronic DOE (dyspnea on exertion)    Claudication    a. Bilat hip claudication since ~ 2019.   COPD (chronic obstructive pulmonary disease) (HCC)    Coronary artery calcification seen on CT scan 09/20/2018   Depression    Dyspnea    Emphysema lung (HCC)    Exertional angina    a. Ex angina since ~ 2019.   GERD (gastroesophageal reflux disease)    Goals of care, counseling/discussion 11/03/2018   Hiatal hernia    History of 2019 novel coronavirus disease (COVID-19) 01/16/2021   History of bronchitis    History of kidney stones    Hypothyroidism     Pleural effusion    a. 09/2018 s/p thoracentesis-->942ml.   Resting tremor    Tobacco abuse    Waldenstrom's macroglobulinemia (HCC) 11/03/2018   Patient Active Problem List   Diagnosis Date Noted   Anemia, chronic renal failure 11/06/2018   Goals of care, counseling/discussion 11/03/2018   Waldenstrom's macroglobulinemia (HCC) 11/03/2018   Abdominal pain 09/28/2018   HLD (hyperlipidemia) 09/28/2018   Depression 09/28/2018   Chronic systolic CHF (congestive heart failure) (HCC) 09/28/2018   Iron deficiency 09/28/2018   SOB (shortness of breath) 09/20/2018   CKD (chronic kidney disease), stage III (HCC) 09/20/2018   Hypothyroidism 09/20/2018   Tobacco abuse 09/20/2018   Tinea pedis 09/20/2018   COPD (chronic obstructive pulmonary disease) (HCC) 09/20/2018   Nephrolithiasis 09/20/2018   Panic disorder with agoraphobia 05/09/2018   Home Medication(s) Prior to Admission medications  Medication Sig Start Date End Date Taking? Authorizing Provider  DULoxetine  (CYMBALTA ) 60 MG capsule TAKE 2 CAPSULES BY MOUTH DAILY 04/15/24   Cottle, Lorene KANDICE Raddle., MD  traZODone  (DESYREL ) 150 MG tablet TAKE 1 TABLET BY MOUTH AT BEDTIME. 04/15/24   Cottle, Lorene KANDICE Raddle., MD  Acetaminophen  (ACETAMINOPHEN  EXTRA STRENGTH) 500 MG capsule Take 1,000 mg by mouth every 6 (six) hours as needed for pain.    [provider]  albuterol  (VENTOLIN  HFA) 108 (90 Base) MCG/ACT inhaler Inhale 1-2 puffs into the lungs every 4 (  four) hours as needed. 05/01/23   [provider]  carvedilol  (COREG ) 12.5 MG tablet TAKE 1 TABLET (12.5MG  TOTAL) BY MOUTH TWICE A DAY WITH MEALS 04/13/24   Hilty, Vinie BROCKS, MD  clonazePAM  (KLONOPIN ) 0.5 MG tablet Take 1 tablet (0.5 mg total) by mouth 2 (two) times daily. 10/18/23   Cottle, Lorene KANDICE Raddle., MD  lidocaine -prilocaine  (EMLA ) cream Apply 1 application topically as needed. Apply to [facility] 1 hour before access 06/05/21   Timmy Maude SAUNDERS, MD  omeprazole (PRILOSEC) 10 MG capsule Take  10 mg by mouth daily.    [provider]  triamcinolone ointment (KENALOG) 0.1 % Apply 1 application topically daily as needed (eczema). 04/30/21   [provider]                                                                                                                                    Past Surgical History Past Surgical History:  Procedure Laterality Date   BOTOX  INJECTION N/A 05/25/2021   Procedure: BOTOX  INJECTION;  Surgeon: Penne Knee, MD;  Location: [facility] ORS;  Service: Urology;  Laterality: N/A;   BREAST LUMPECTOMY WITH RADIOACTIVE SEED LOCALIZATION Left 12/28/2017   Procedure: BREAST LUMPECTOMY WITH RADIOACTIVE SEED LOCALIZATION;  Surgeon: Curvin Deward MOULD, MD;  Location: [facility] OR;  Service: General;  Laterality: Left;   BREAST SURGERY     CYSTOSCOPY/URETEROSCOPY/HOLMIUM LASER/STENT PLACEMENT Right 05/25/2021   Procedure: CYSTOSCOPY/URETEROSCOPY/HOLMIUM LASER/STENT PLACEMENT;  Surgeon: Penne Knee, MD;  Location: [facility] ORS;  Service: Urology;  Laterality: Right;   DG THUMB RIGHT HAND ([facility] HX)     IR IMAGING GUIDED PORT INSERTION  11/09/2018   IR THORACENTESIS ASP PLEURAL SPACE W/IMG GUIDE  09/20/2018   KIDNEY STONE SURGERY     Family History Family History  Problem Relation Age of Onset   Stroke Mother        Mini-strokes. Died @ 60.   Dementia Mother    Lung cancer Father        Died in his 5's   Addison's disease Sister    Hypertension Brother    CAD Brother    Hypertension Brother     Social History Social History[1] Allergies Codeine  Review of Systems A thorough review of systems was obtained and all systems are negative except as noted in the HPI and PMH.   Physical Exam Vital Signs  I have reviewed the triage vital signs BP 116/73   Pulse 77   Temp (!) 97.5 F (36.4 C) (Oral)   Resp 20   Ht 5' 1 (1.549 m)   Wt 69 kg   SpO2 97%   BMI 28.74 kg/m  Physical Exam Vitals and nursing note reviewed.  Constitutional:      General:  She is not in acute distress.    Appearance: Normal appearance.  HENT:     Head: Normocephalic and atraumatic.     Right Ear: External ear  normal.     Left Ear: External ear normal.     Nose: Nose normal.     Mouth/Throat:     Mouth: Mucous membranes are moist.  Eyes:     General: No scleral icterus.       Right eye: No discharge.        Left eye: No discharge.  Cardiovascular:     Rate and Rhythm: Normal rate and regular rhythm.     Pulses: Normal pulses.     Heart sounds: Normal heart sounds.  Pulmonary:     Effort: Pulmonary effort is normal. No respiratory distress.     Breath sounds: Normal breath sounds. No stridor.  Abdominal:     General: Abdomen is flat. There is no distension.     Palpations: Abdomen is soft.     Tenderness: There is no abdominal tenderness.  Musculoskeletal:       Arms:     Cervical back: No rigidity.     Right lower leg: No edema.     Left lower leg: No edema.     Comments: upper extremities are NVI.  No midline C-spine pain  Skin:    General: Skin is warm and dry.     Capillary Refill: Capillary refill takes less than 2 seconds.  Neurological:     Mental Status: She is alert.  Psychiatric:        Mood and Affect: Mood normal.        Behavior: Behavior normal. Behavior is cooperative.     ED Results and Treatments Labs (all labs ordered are listed, but only abnormal results are displayed) Labs Reviewed  BASIC METABOLIC PANEL WITH GFR - Abnormal; Notable for the following components:      Result Value   CO2 17 (*)    Glucose, Bld 109 (*)    Anion gap 16 (*)    All other components within normal limits  CBC - Abnormal; Notable for the following components:   RBC 3.80 (*)    MCV 102.6 (*)    All other components within normal limits  D-DIMER, QUANTITATIVE - Abnormal; Notable for the following components:   D-Dimer, Quant 1.54 (*)    All other components within normal limits  PROTIME-INR  TROPONIN T, HIGH SENSITIVITY  TROPONIN T,  HIGH SENSITIVITY                                                                                                                          Radiology CT Angio Chest PE W and/or Wo Contrast Result Date: 04/23/2024 EXAM: CTA CHEST 04/23/2024 11:11:50 PM TECHNIQUE: CTA of the chest was performed without and with the administration of 75 mL of intravenous contrast (iohexol  (OMNIPAQUE ) 350 MG/ML injection 75 mL IOHEXOL  350 MG/ML SOLN). Multiplanar reformatted images are provided for review. MIP images are provided for review. Mild motion artifact. Automated exposure control, iterative reconstruction, and/or weight based adjustment of the mA/kV was utilized to reduce the radiation dose  to as low as reasonably achievable. COMPARISON: Comparison with chest radiograph 04/23/2024 and CT chest 02/10/2023. CLINICAL HISTORY: Pulmonary embolism (PE) suspected, low to intermediate prob, positive D-dimer. FINDINGS: PULMONARY ARTERIES: Good opacification of the central and segmental pulmonary arteries. Linear filling defects in the left lower lobe and subsegmental pulmonary arteries consistent with acute pulmonary embolus. No significant emboli on the right. Main pulmonary artery is normal in caliber. MEDIASTINUM: Heart: Normal heart size. RV to LV ratio measures 0.76. No pericardial effusions. Calcification of coronary arteries. Aorta: Normal caliber thoracic aorta. Calcification of the aorta. No aortic dissection. Esophagus: Esophagus is decompressed. Small esophageal hiatal hernia. Thyroid: Thyroid gland is unremarkable. Central venous catheter: Tip in the superior vena cava. LYMPH NODES: No significant lymphadenopathy. LUNGS AND PLEURA: Scarring in the lung apices. Emphysematous changes in the lungs. Infiltration or atelectasis in the lung bases. No pleural effusion or pneumothorax. UPPER ABDOMEN: No acute abnormalities demonstrated in the upper abdomen. SOFT TISSUES AND BONES: No acute bone or soft tissue abnormality.  Critical results/urgent findings were called at 11:23 PM on 04/23/2024 by radiologist W. Okey Gravely, MD to Dr. Elnor. IMPRESSION: 1. Acute pulmonary embolus in the left lower lobe and subsegmental pulmonary arteries, without significant right-sided emboli. 2. No evidence of right heart strain. Electronically signed by: Elsie Gravely MD 04/23/2024 11:27 PM EST RP Workstation: [id number]   DG Chest 2 View Result Date: 04/23/2024 EXAM: 2 VIEW(S) XRAY OF THE CHEST 04/23/2024 08:51:00 PM COMPARISON: 04/23/2024 CLINICAL HISTORY: cp/sob cp/sob FINDINGS: LINES, TUBES AND DEVICES: Right port-a-cath remains in place, unchanged. LUNGS AND PLEURA: Predominantly linear scarring or atelectasis in the lung bases. No pleural effusion. No pneumothorax. HEART AND MEDIASTINUM: No acute abnormality of the cardiac and mediastinal silhouettes. BONES AND SOFT TISSUES: No acute osseous abnormality. IMPRESSION: 1. No acute process. Electronically signed by: Franky Crease MD 04/23/2024 08:58 PM EST RP Workstation: [id number]    Pertinent labs & imaging results that were available during my care of the patient were reviewed by me and considered in my medical decision making (see MDM for details).  Medications Ordered in ED Medications  lidocaine  (LIDODERM ) 5 % 1 patch (1 patch Transdermal Patch Applied 04/23/24 2233)  morphine  (PF) 2 MG/ML injection 2 mg (2 mg Intravenous Given 04/23/24 2227)  ondansetron  (ZOFRAN ) injection 4 mg (4 mg Intravenous Given 04/23/24 2225)  sodium chloride  0.9 % bolus 500 mL (500 mLs Intravenous New Bag/Given 04/23/24 2238)  cyclobenzaprine  (FLEXERIL ) tablet 5 mg (5 mg Oral Given 04/23/24 2229)  iohexol  (OMNIPAQUE ) 350 MG/ML injection 75 mL (75 mLs Intravenous Contrast Given 04/23/24 2313)                                                                                                                                     Procedures .Critical Care  Performed by: Elnor Jayson LABOR, DO Authorized by: Elnor Jayson LABOR, DO   Critical care provider statement:  Critical care time (minutes):  30   Critical care time was exclusive of:  Separately billable procedures and treating other patients   Critical care was necessary to treat or prevent imminent or life-threatening deterioration of the following conditions:  Circulatory failure   Critical care was time spent personally by me on the following activities:  Development of treatment plan with patient or surrogate, discussions with consultants, evaluation of patient's response to treatment, examination of patient, ordering and review of laboratory studies, ordering and review of radiographic studies, ordering and performing treatments and interventions, pulse oximetry, re-evaluation of patient's condition, review of old charts and obtaining history from patient or surrogate   (including critical care time)  Medical Decision Making / ED Course    Medical Decision Making:    Tiffany Leon is a 74 y.o. female with past medical history as below, significant for CHF, COPD, emphysema, who presents to the ED with complaint of left shoulder pain, abnormal D-dimer. The complaint involves an extensive differential diagnosis and also carries with it a high risk of complications and morbidity.  Serious etiology was considered. Ddx includes but is not limited to: Differential includes all life-threatening causes for chest pain. This includes but is not exclusive to acute coronary syndrome, aortic dissection, pulmonary embolism, cardiac tamponade, community-acquired pneumonia, pericarditis, musculoskeletal chest wall pain, etc.   Complete initial physical exam performed, notably the patient was in no acute distress, no hypoxia.    Reviewed and confirmed nursing documentation for past medical history, family history, social history.  Vital signs reviewed.    Chest/shoulder pain PE > - Pain to her left shoulder, trapezius.  Reproducible on palpation.  Also having pain  around her left breast over last 2 days. - No dyspnea reported, having ongoing chest pain during my exam - Positive D-dimer in office, D-dimer also positive here, get CT angio - Troponin negative x1 - CTA shows PE, start [facility] and plan for admit   Dr Griselda taking over care at shift change                     Additional history obtained: -Additional history obtained from family -External records from outside source obtained and reviewed including: Chart review including previous notes, labs, imaging, consultation notes including  Prior labs Home meds    Lab Tests: -I ordered, reviewed, and interpreted labs.   The pertinent results include:   Labs Reviewed  BASIC METABOLIC PANEL WITH GFR - Abnormal; Notable for the following components:      Result Value   CO2 17 (*)    Glucose, Bld 109 (*)    Anion gap 16 (*)    All other components within normal limits  CBC - Abnormal; Notable for the following components:   RBC 3.80 (*)    MCV 102.6 (*)    All other components within normal limits  D-DIMER, QUANTITATIVE - Abnormal; Notable for the following components:   D-Dimer, Quant 1.54 (*)    All other components within normal limits  PROTIME-INR  TROPONIN T, HIGH SENSITIVITY  TROPONIN T, HIGH SENSITIVITY    Notable for dimer +tive  EKG   EKG Interpretation Date/Time:    Ventricular Rate:    PR Interval:    QRS Duration:    QT Interval:    QTC Calculation:   R Axis:      Text Interpretation:           Imaging Studies ordered: I ordered imaging studies including CXR  CTA I independently visualized the following imaging with scope of interpretation limited to determining acute life threatening conditions related to emergency care; findings noted above I agree with the radiologist interpretation If any imaging was obtained with contrast I closely monitored patient for any possible adverse reaction a/w contrast administration in the emergency  department   Medicines ordered and prescription drug management: Meds ordered this encounter  Medications   lidocaine  (LIDODERM ) 5 % 1 patch   morphine  (PF) 2 MG/ML injection 2 mg   ondansetron  (ZOFRAN ) injection 4 mg   sodium chloride  0.9 % bolus 500 mL   cyclobenzaprine  (FLEXERIL ) tablet 5 mg   iohexol  (OMNIPAQUE ) 350 MG/ML injection 75 mL    -I have reviewed the patients home medicines and have made adjustments as needed   Consultations Obtained: na   Cardiac Monitoring: The patient was maintained on a cardiac monitor.  I personally viewed and interpreted the cardiac monitored which showed an underlying rhythm of: nsr Continuous pulse oximetry interpreted by myself, 98% on RA.    Social Determinants of Health:  Diagnosis or treatment significantly limited by social determinants of health: current smoker   Reevaluation: After the interventions noted above, I reevaluated the patient and found that they have improved  Co morbidities that complicate the patient evaluation  Past Medical History:  Diagnosis Date   Acute kidney injury    a. 09/2018   Anxiety    Aortic atherosclerosis    a. 09/2018 noted on Chest CT.   Chronic combined systolic (congestive) and diastolic (congestive) heart failure (HCC)    a.) 09/2018 Echo: EF 45-50%, diff HK. Triv to small circumfirential pericardial eff. b.) TTE 02/14/2020: EF 60-65%; no RWMAs; G1DD; GLS -23.3%   Chronic DOE (dyspnea on exertion)    Claudication    a. Bilat hip claudication since ~ 2019.   COPD (chronic obstructive pulmonary disease) (HCC)    Coronary artery calcification seen on CT scan 09/20/2018   Depression    Dyspnea    Emphysema lung (HCC)    Exertional angina    a. Ex angina since ~ 2019.   GERD (gastroesophageal reflux disease)    Goals of care, counseling/discussion 11/03/2018   Hiatal hernia    History of 2019 novel coronavirus disease (COVID-19) 01/16/2021   History of bronchitis    History of kidney  stones    Hypothyroidism    Pleural effusion    a. 09/2018 s/p thoracentesis-->940ml.   Resting tremor    Tobacco abuse    Waldenstrom's macroglobulinemia (HCC) 11/03/2018      Dispostion: Disposition decision including need for hospitalization was considered, and patient disposition pending at time of sign out.    Final Clinical Impression(s) / ED Diagnoses Final diagnoses:  Single subsegmental pulmonary embolism without acute cor pulmonale (HCC)  Chest pain, unspecified type         [1]  Social History Tobacco Use   Smoking status: Every Day    Current packs/day: 1.00    Average packs/day: 1 pack/day for 40.0 years (40.0 ttl pk-yrs)    Types: Cigarettes   Smokeless tobacco: Never  Vaping Use   Vaping status: Never Used  Substance Use Topics   Alcohol use: Not Currently    Comment: 1-2 gl wine / night - none since 07/2018   Drug use: Not Currently    Comment: prev used drugs ~ 35 yrs ago.     Elnor Jayson LABOR, DO 04/23/24 2338  "

## 2024-04-24 ENCOUNTER — Encounter: Payer: Self-pay | Admitting: Internal Medicine

## 2024-04-24 MED ORDER — APIXABAN 5 MG PO TABS: 5.0000 mg | ORAL_TABLET | Freq: Two times a day (BID) | ORAL | Status: DC

## 2024-04-24 MED ORDER — LIDOCAINE 5 % EX PTCH: 1.0000 | MEDICATED_PATCH | CUTANEOUS | 0 refills | Status: AC

## 2024-04-24 MED ORDER — APIXABAN (ELIQUIS) VTE STARTER PACK (10MG AND 5MG): ORAL_TABLET | ORAL | 0 refills | Status: AC

## 2024-04-24 MED ORDER — APIXABAN 5 MG PO TABS
10.0000 mg | ORAL_TABLET | Freq: Two times a day (BID) | ORAL | Status: DC
  Administered 2024-04-24: 10 mg via ORAL
  Filled 2024-04-24: qty 2

## 2024-05-05 NOTE — Progress Notes (Unsigned)
 Proximal motor 1 stool is formed Cardiology Office Note:    Date:  05/07/2024   ID:  Tiffany Leon, DOB 07-22-1950, MRN [id number]  PCP:  [facility] Providers Cardiologist:  Vinie JAYSON Maxcy, MD     Referring MD: [facility] @ [place]   Chief complaint: Follow-up ED visit new pulmonary embolism     History of Present Illness:   Tiffany Leon is a 74 y.o. female with a hx of CHF, pleural effusion s/p thoracentesis, Waldenstrm's macroglobulinemia, exertional angina, hypothyroidism, presenting today for follow-up of recent ED visit demonstrating new pulmonary embolism.  No prior cardiac history until 2020 when patient was newly diagnosed with heart failure.  March 2025 presented to the ED following 2-3 weeks of worsening dyspnea, LE edema, orthopnea, CT scan demonstrating pleural effusion for which she underwent a thoracentesis with removal of 900 cc of transudative fluid.  EF 40-45%, diffuse hypokinesis, trivial small pericardial effusion.  She reported a 1 year history of exertional chest tightness, diuresed, hypertensive in the 150s-180s.  Beta-blocker and BiDil  added to her medical therapy.  Lexiscan  Myoview  in October 2020 showed LVEF was mildly decreased (45-54%), no ST deviation, low risk study, mild global hypokinesis present, no ischemia or infarction.  She was following with oncology for chemotherapy for Waldenstrm's macroglobulinemia, which she completed treatment for in 2022.  Most recently seen in the office with Dr. Maxcy in October 2024, she was asymptomatic and doing well at the time.  Coronary artery calcifications present on screening chest CT, with mild atherosclerotic calcification in the wall of the thoracic aorta.  On 04/23/2024 patient presented to the emergency department with complaints of left shoulder pain and abnormal D-dimer at that resulted from her PCPs office.  No significant shortness of breath,  pain worsened with movement of upper extremity.  D-dimer 1.54 in the ED.  Labs otherwise unremarkable, including negative troponin.  CXR demonstrating no acute process.  CTA PE demonstrated acute pulmonary emboli of the left lower lobe and subsegmental pulmonary arteries, without significant right sided emboli, no evidence of right heart strain.  Patient was started on appropriate dose of Eliquis  and discharged.  Presents with her husband Tiffany Leon to clinic, appears stable from a cardiovascular standpoint.  Denies chest pain, orthopnea, PND, palpitations, dark/tarry/bloody stools, weight gain, edema.  She does report that she is more easily fatigued following her recent pulmonary embolism.  States she had itching while on Eliquis , her PCP switched her to Xarelto, which she is still experiencing minor itchiness while taking.  Reports she does have SOB at baseline 2/2 her COPD, she reports this is unchanged.  States that she did have 1 episode in the last week where she felt dizzy and nauseated, no other associated symptoms, but improved with rest, has not recurred.  States she has a history of falls, but has not had any recently, uses a cane to ambulate.  States she had some hematuria, with pink-tinged urine, on arrival to ED 2 weeks prior.  States this improved following antibiotic use.  Has since finished the antibiotics, states the pink-tinged urine did restart yesterday, no fever or dysuria.  She does have follow-up with her PCP for continued management of her OAC/UTI on Wednesday this week.  She denies any recent chemotherapy, long trips, surgeries, hormone replacement therapy.  ROS:   Please see the history of present illness.    All other systems reviewed and are negative.  Past Medical History:  Diagnosis Date   Acute kidney injury    a. 09/2018   Anxiety    Aortic atherosclerosis    a. 09/2018 noted on Chest CT.   Chronic combined systolic (congestive) and diastolic (congestive) heart  failure (HCC)    a.) 09/2018 Echo: EF 45-50%, diff HK. Triv to small circumfirential pericardial eff. b.) TTE 02/14/2020: EF 60-65%; no RWMAs; G1DD; GLS -23.3%   Chronic DOE (dyspnea on exertion)    Claudication    a. Bilat hip claudication since ~ 2019.   COPD (chronic obstructive pulmonary disease) (HCC)    Coronary artery calcification seen on CT scan 09/20/2018   Depression    Dyspnea    Emphysema lung (HCC)    Exertional angina    a. Ex angina since ~ 2019.   GERD (gastroesophageal reflux disease)    Goals of care, counseling/discussion 11/03/2018   Hiatal hernia    History of 2019 novel coronavirus disease (COVID-19) 01/16/2021   History of bronchitis    History of kidney stones    Hypothyroidism    Pleural effusion    a. 09/2018 s/p thoracentesis-->94ml.   Resting tremor    Tobacco abuse    Waldenstrom's macroglobulinemia (HCC) 11/03/2018    Past Surgical History:  Procedure Laterality Date   BOTOX  INJECTION N/A 05/25/2021   Procedure: BOTOX  INJECTION;  Surgeon: Penne Knee, MD;  Location: [facility] ORS;  Service: Urology;  Laterality: N/A;   BREAST LUMPECTOMY WITH RADIOACTIVE SEED LOCALIZATION Left 12/28/2017   Procedure: BREAST LUMPECTOMY WITH RADIOACTIVE SEED LOCALIZATION;  Surgeon: Curvin Deward MOULD, MD;  Location[facility];  Service: General;  Laterality: Left;   BREAST SURGERY     CYSTOSCOPY/URETEROSCOPY/HOLMIUM LASER/STENT PLACEMENT Right 05/25/2021   Procedure: CYSTOSCOPY/URETEROSCOPY/HOLMIUM LASER/STENT PLACEMENT;  Surgeon: Penne Knee, MD;  Location: [facility] ORS;  Service: Urology;  Laterality: Right;   DG THUMB RIGHT HAND ([facility] HX)     IR IMAGING GUIDED PORT INSERTION  11/09/2018   IR THORACENTESIS RIGHT ASP PLEURAL SPACE W/IMG GUIDE  09/20/2018   KIDNEY STONE SURGERY      Current Medications: Active Medications[1]   Allergies:   Codeine   Social History   Socioeconomic History   Marital status: Married    Spouse name: Not on file   Number of children: Not on  file   Years of education: Not on file   Highest education level: Not on file  Occupational History   Not on file  Tobacco Use   Smoking status: Every Day    Current packs/day: 1.00    Average packs/day: 1 pack/day for 40.0 years (40.0 ttl pk-yrs)    Types: Cigarettes   Smokeless tobacco: Never  Vaping Use   Vaping status: Never Used  Substance and Sexual Activity   Alcohol use: Not Currently    Comment: 1-2 gl wine / night - none since 07/2018   Drug use: Not Currently    Comment: prev used drugs ~ 35 yrs ago.   Sexual activity: Not on file  Other Topics Concern   Not on file  Social History Narrative   Lives in [place] with her husband.  Retired catering manager.  Does not routinely exercise.   Social Drivers of Health   Tobacco Use: High Risk (05/07/2024)   Patient History    Smoking Tobacco Use: Every Day    Smokeless Tobacco Use: Never    Passive Exposure: Not on file  Financial Resource Strain: Not on file  Food Insecurity: Not on file  Transportation Needs: Not on file  Physical Activity: Not on file  Stress: Not on file  Social Connections: Not on file  Depression (PHQ2-9): Low Risk (11/22/2023)   Depression (PHQ2-9)    PHQ-2 Score: 0  Alcohol Screen: Not on file  Housing: Not on file  Utilities: Not on file  Health Literacy: Not on file     Family History: The patient's family history includes Addison's disease in her sister; CAD in her brother; Dementia in her mother; Hypertension in her brother and brother; Lung cancer in her father; Stroke in her mother.  EKGs/Labs/Other Studies Reviewed:    The following studies were reviewed today:       Recent Labs: 11/22/2023: ALT 11 04/23/2024: BUN 19; Creatinine, Ser 0.90; Hemoglobin 12.8; Platelets 205; Potassium 3.9; Sodium 137  Recent Lipid Panel    Component Value Date/Time   CHOL 121 09/28/2018 0328   TRIG 102 09/28/2018 0328   HDL 29 (L) 09/28/2018 0328   CHOLHDL 4.2 09/28/2018 0328   VLDL 20 09/28/2018 0328    LDLCALC 72 09/28/2018 0328     Risk Assessment/Calculations:           STOP-Bang Score:          Physical Exam:    VS:  BP 110/62   Pulse 78   Ht 5' 5.5 (1.664 m)   Wt 142 lb (64.4 kg)   SpO2 95%   BMI 23.27 kg/m        Wt Readings from Last 3 Encounters:  05/07/24 142 lb (64.4 kg)  04/23/24 152 lb 1.9 oz (69 kg)  04/03/24 150 lb (68 kg)     GEN:  Well nourished, well developed in no acute distress HEENT: Normal NECK:  No carotid bruits CARDIAC:  S1-S2 normal, RRR, no murmurs, rubs, gallops RESPIRATORY:  Clear to auscultation without rales, wheezing or rhonchi  MUSCULOSKELETAL:  No edema; No deformity  SKIN: Warm and dry NEUROLOGIC:  Alert and oriented x 3 PSYCHIATRIC:  Normal affect       Assessment & Plan Single subsegmental pulmonary embolism without acute cor pulmonale (HCC) Other fatigue 04/23/2024: CTA PE demonstrated acute pulmonary emboli of the left lower lobe and subsegmental pulmonary arteries, without significant right sided emboli, no evidence of right heart strain. No identifiable causative factor. Currently on loading dose of Xarelto as managed by PCP, reports mild itching with this, no hives, GI upset, or breathing difficulties.  Has follow-up with PCP on Wednesday to discuss possibly switching to warfarin given ongoing itching with OAC. She does report new fatigue since leaving the emergency department following the PE.  Will plan to repeat echo today to evaluate for new evidence of right heart strain. Otherwise reports compliance with OAC, denies significant bleeding/bruising Coronary artery disease involving native coronary artery of native heart without angina pectoris Coronary artery calcifications noted on chest CTs in the past.  Asymptomatic, denies chest pain, changes to baseline SOB, palpitations, edema. Reports new fatigue following pulmonary embolism, troponins negative at recent ED visit Continue to monitor for new signs or  symptoms Continue Coreg  12.5 mg twice daily Chronic systolic CHF (congestive heart failure) (HCC) Echo 09/2018: LVEF 45-50%, indeterminate diastolic parameters Echo 01/2020: LVEF 60-65%, no RWMA, G1 DD. Reports chronic SOB 2/2 COPD, stable, unchanged. Denies orthopnea, PND, edema, weight changes Continue Coreg  12.5 mg twice daily Chronic bronchitis, unspecified chronic bronchitis type (HCC) Chronic SOB/DOE at baseline, stable, unchanged Followed by pulmonology Waldenstrom's macroglobulinemia [facility]) Was treated for this with chemotherapy, completed  chemo around 2022  Disposition: Follow-up with primary cardiologist in 3-4 months, or sooner if needed.  Proceed to the emergency department with any new or worsening symptoms.            Medication Adjustments/Labs and Tests Ordered: Current medicines are reviewed at length with the patient today.  Concerns regarding medicines are outlined above.  Orders Placed This Encounter  Procedures   ECHOCARDIOGRAM COMPLETE   No orders of the defined types were placed in this encounter.   Patient Instructions  Medication Instructions:   Your physician recommends that you continue on your current medications as directed. Please refer to the Current Medication list given to you today.   *If you need a refill on your cardiac medications before your next appointment, please call your pharmacy*   Lab Work: NONE ORDERED  TODAY    If you have labs (blood work) drawn today and your tests are completely normal, you will receive your results only by: MyChart Message (if you have MyChart) OR A paper copy in the mail If you have any lab test that is abnormal or we need to change your treatment, we will call you to review the results.    Testing/Procedures:  Your physician has requested that you have an echocardiogram. Echocardiography is a painless test that uses sound waves to create images of your heart. It provides your doctor with information  about the size and shape of your heart and how well your hearts chambers and valves are working. This procedure takes approximately one hour. There are no restrictions for this procedure. Please do NOT wear cologne, perfume, aftershave, or lotions (deodorant is allowed). Please arrive 15 minutes prior to your appointment time.  Please note: We ask at that you not bring children with you during ultrasound (echo/ vascular) testing. Due to room size and safety concerns, children are not allowed in the ultrasound rooms during exams. Our front office staff cannot provide observation of children in our lobby area while testing is being conducted. An adult accompanying a patient to their appointment will only be allowed in the ultrasound room at the discretion of the ultrasound technician under special circumstances. We apologize for any inconvenience.     Follow-Up: At [facility], you and your health needs are our priority.  As part of our continuing mission to provide you with exceptional heart care, our providers are all part of one team.  This team includes your primary Cardiologist (physician) and Advanced Practice Providers or APPs (Physician Assistants and Nurse Practitioners) who all work together to provide you with the care you need, when you need it.   Your next appointment:   3-4  month(s)   Provider:    Vinie JAYSON Maxcy, MD    We recommend signing up for the patient portal called MyChart.  Sign up information is provided on this After Visit Summary.  MyChart is used to connect with patients for Virtual Visits (Telemedicine).  Patients are able to view lab/test results, encounter notes, upcoming appointments, etc.  Non-urgent messages can be sent to your provider as well.   To learn more about what you can do with MyChart, go to [email or web address].    Other Instructions            Signed, Miriam FORBES Shams, NP  05/07/2024 1:41 PM    [facility]      [1]  Current Meds  Medication Sig   Acetaminophen  (ACETAMINOPHEN  EXTRA STRENGTH) 500 MG capsule Take 1,000  mg by mouth every 6 (six) hours as needed for pain.   albuterol  (VENTOLIN  HFA) 108 (90 Base) MCG/ACT inhaler Inhale 1-2 puffs into the lungs every 4 (four) hours as needed.   APIXABAN  (ELIQUIS ) VTE STARTER PACK (10MG  AND 5MG ) Take as directed on package: start with two-5mg  tablets twice daily for 7 days. On day 8, switch to one-5mg  tablet twice daily.   carvedilol  (COREG ) 12.5 MG tablet TAKE 1 TABLET (12.5MG  TOTAL) BY MOUTH TWICE A DAY WITH MEALS   clonazePAM  (KLONOPIN ) 0.5 MG tablet Take 1 tablet (0.5 mg total) by mouth 2 (two) times daily.   DULoxetine  (CYMBALTA ) 60 MG capsule TAKE 2 CAPSULES BY MOUTH DAILY   lidocaine  (LIDODERM ) 5 % Place 1 patch onto the skin daily. Remove & Discard patch within 12 hours or as directed by MD   lidocaine -prilocaine  (EMLA ) cream Apply 1 application topically as needed. Apply to [facility] 1 hour before access   omeprazole (PRILOSEC) 10 MG capsule Take 10 mg by mouth daily.   traZODone  (DESYREL ) 150 MG tablet TAKE 1 TABLET BY MOUTH AT BEDTIME.   triamcinolone ointment (KENALOG) 0.1 % Apply 1 application topically daily as needed (eczema).

## 2024-05-07 ENCOUNTER — Encounter: Payer: Self-pay | Admitting: Emergency Medicine

## 2024-05-07 ENCOUNTER — Other Ambulatory Visit: Payer: Self-pay

## 2024-05-07 ENCOUNTER — Telehealth: Payer: Self-pay | Admitting: Psychiatry

## 2024-05-07 ENCOUNTER — Ambulatory Visit: Admitting: Emergency Medicine

## 2024-05-07 ENCOUNTER — Ambulatory Visit

## 2024-05-07 VITALS — BP 110/62 | HR 78 | Ht 65.5 in | Wt 142.0 lb

## 2024-05-07 DIAGNOSIS — I5022 Chronic systolic (congestive) heart failure: Secondary | ICD-10-CM | POA: Diagnosis not present

## 2024-05-07 DIAGNOSIS — I2693 Single subsegmental pulmonary embolism without acute cor pulmonale: Secondary | ICD-10-CM

## 2024-05-07 DIAGNOSIS — R5383 Other fatigue: Secondary | ICD-10-CM | POA: Diagnosis not present

## 2024-05-07 DIAGNOSIS — J42 Unspecified chronic bronchitis: Secondary | ICD-10-CM | POA: Diagnosis not present

## 2024-05-07 DIAGNOSIS — C8800 Waldenstrom macroglobulinemia not having achieved remission: Secondary | ICD-10-CM

## 2024-05-07 DIAGNOSIS — I2510 Atherosclerotic heart disease of native coronary artery without angina pectoris: Secondary | ICD-10-CM | POA: Diagnosis not present

## 2024-05-07 DIAGNOSIS — F4001 Agoraphobia with panic disorder: Secondary | ICD-10-CM

## 2024-05-07 MED ORDER — CLONAZEPAM 0.5 MG PO TABS: 0.5000 mg | ORAL_TABLET | Freq: Two times a day (BID) | ORAL | 0 refills | Status: DC

## 2024-05-07 NOTE — Assessment & Plan Note (Signed)
 Was treated for this with chemotherapy, completed chemo around 2022

## 2024-05-07 NOTE — Assessment & Plan Note (Signed)
 Echo 09/2018: LVEF 45-50%, indeterminate diastolic parameters Echo 01/2020: LVEF 60-65%, no RWMA, G1 DD. Reports chronic SOB 2/2 COPD, stable, unchanged. Denies orthopnea, PND, edema, weight changes Continue Coreg  12.5 mg twice daily

## 2024-05-07 NOTE — Telephone Encounter (Signed)
 Pended

## 2024-05-07 NOTE — Telephone Encounter (Signed)
 Next visit is 05/22/24. Requesting refill for Clonazepam  0.5 mg called to:   Pharmacy is:   [id number] - [place], [place] - [address]      Phone: [phone]  Fax: [phone]

## 2024-05-07 NOTE — Patient Instructions (Signed)
 Medication Instructions:   Your physician recommends that you continue on your current medications as directed. Please refer to the Current Medication list given to you today.   *If you need a refill on your cardiac medications before your next appointment, please call your pharmacy*   Lab Work: NONE ORDERED  TODAY    If you have labs (blood work) drawn today and your tests are completely normal, you will receive your results only by: MyChart Message (if you have MyChart) OR A paper copy in the mail If you have any lab test that is abnormal or we need to change your treatment, we will call you to review the results.    Testing/Procedures:  Your physician has requested that you have an echocardiogram. Echocardiography is a painless test that uses sound waves to create images of your heart. It provides your doctor with information about the size and shape of your heart and how well your hearts chambers and valves are working. This procedure takes approximately one hour. There are no restrictions for this procedure. Please do NOT wear cologne, perfume, aftershave, or lotions (deodorant is allowed). Please arrive 15 minutes prior to your appointment time.  Please note: We ask at that you not bring children with you during ultrasound (echo/ vascular) testing. Due to room size and safety concerns, children are not allowed in the ultrasound rooms during exams. Our front office staff cannot provide observation of children in our lobby area while testing is being conducted. An adult accompanying a patient to their appointment will only be allowed in the ultrasound room at the discretion of the ultrasound technician under special circumstances. We apologize for any inconvenience.     Follow-Up: At [facility], you and your health needs are our priority.  As part of our continuing mission to provide you with exceptional heart care, our providers are all part of one team.  This team  includes your primary Cardiologist (physician) and Advanced Practice Providers or APPs (Physician Assistants and Nurse Practitioners) who all work together to provide you with the care you need, when you need it.   Your next appointment:   3-4  month(s)   Provider:    Vinie JAYSON Maxcy, MD    We recommend signing up for the patient portal called MyChart.  Sign up information is provided on this After Visit Summary.  MyChart is used to connect with patients for Virtual Visits (Telemedicine).  Patients are able to view lab/test results, encounter notes, upcoming appointments, etc.  Non-urgent messages can be sent to your provider as well.   To learn more about what you can do with MyChart, go to [email or web address].    Other Instructions

## 2024-05-07 NOTE — Assessment & Plan Note (Signed)
 Chronic SOB/DOE at baseline, stable, unchanged Followed by pulmonology

## 2024-05-07 NOTE — Progress Notes (Unsigned)
Enrolled patient for a 7 day Zio XT monitor to be mailed to patients home   Hilty to read

## 2024-05-10 ENCOUNTER — Other Ambulatory Visit: Payer: Self-pay | Admitting: Internal Medicine

## 2024-05-22 ENCOUNTER — Ambulatory Visit: Admitting: Psychiatry

## 2024-05-22 ENCOUNTER — Encounter: Payer: Self-pay | Admitting: Psychiatry

## 2024-05-22 DIAGNOSIS — F330 Major depressive disorder, recurrent, mild: Secondary | ICD-10-CM

## 2024-05-22 DIAGNOSIS — F4001 Agoraphobia with panic disorder: Secondary | ICD-10-CM

## 2024-05-22 DIAGNOSIS — L2989 Other pruritus: Secondary | ICD-10-CM

## 2024-05-22 DIAGNOSIS — F5105 Insomnia due to other mental disorder: Secondary | ICD-10-CM

## 2024-05-22 MED ORDER — DULOXETINE HCL 60 MG PO CPEP: 120.0000 mg | ORAL_CAPSULE | Freq: Every day | ORAL | 1 refills | Status: AC

## 2024-05-22 MED ORDER — CLONAZEPAM 0.5 MG PO TABS: 0.5000 mg | ORAL_TABLET | Freq: Two times a day (BID) | ORAL | 5 refills | Status: AC

## 2024-05-22 MED ORDER — HYDROXYZINE HCL 10 MG PO TABS: 10.0000 mg | ORAL_TABLET | Freq: Three times a day (TID) | ORAL | 1 refills | Status: AC | PRN

## 2024-05-22 MED ORDER — TRAZODONE HCL 150 MG PO TABS: 150.0000 mg | ORAL_TABLET | Freq: Every day | ORAL | 1 refills | Status: AC

## 2024-05-23 ENCOUNTER — Other Ambulatory Visit: Payer: Self-pay | Admitting: Medical Oncology

## 2024-05-23 ENCOUNTER — Inpatient Hospital Stay

## 2024-05-23 ENCOUNTER — Encounter: Payer: Self-pay | Admitting: Medical Oncology

## 2024-05-23 ENCOUNTER — Inpatient Hospital Stay: Attending: Hematology & Oncology

## 2024-05-23 ENCOUNTER — Other Ambulatory Visit: Payer: Self-pay

## 2024-05-23 ENCOUNTER — Inpatient Hospital Stay: Admitting: Medical Oncology

## 2024-05-23 VITALS — BP 131/84 | HR 102 | Temp 97.9°F | Resp 18 | Ht 65.5 in | Wt 139.8 lb

## 2024-05-23 DIAGNOSIS — C8800 Waldenstrom macroglobulinemia not having achieved remission: Secondary | ICD-10-CM

## 2024-05-23 DIAGNOSIS — D509 Iron deficiency anemia, unspecified: Secondary | ICD-10-CM | POA: Diagnosis not present

## 2024-05-23 DIAGNOSIS — Z95828 Presence of other vascular implants and grafts: Secondary | ICD-10-CM

## 2024-05-23 DIAGNOSIS — N1831 Chronic kidney disease, stage 3a: Secondary | ICD-10-CM | POA: Diagnosis not present

## 2024-05-23 DIAGNOSIS — D631 Anemia in chronic kidney disease: Secondary | ICD-10-CM

## 2024-05-23 LAB — CBC WITH DIFFERENTIAL (CANCER CENTER ONLY)
Abs Immature Granulocytes: 0.35 10*3/uL — ABNORMAL HIGH (ref 0.00–0.07)
Basophils Absolute: 0 10*3/uL (ref 0.0–0.1)
Basophils Relative: 1 %
Eosinophils Absolute: 0.1 10*3/uL (ref 0.0–0.5)
Eosinophils Relative: 1 %
HCT: 32.8 % — ABNORMAL LOW (ref 36.0–46.0)
Hemoglobin: 10.5 g/dL — ABNORMAL LOW (ref 12.0–15.0)
Immature Granulocytes: 5 %
Lymphocytes Relative: 22 %
Lymphs Abs: 1.5 10*3/uL (ref 0.7–4.0)
MCH: 32.5 pg (ref 26.0–34.0)
MCHC: 32 g/dL (ref 30.0–36.0)
MCV: 101.5 fL — ABNORMAL HIGH (ref 80.0–100.0)
Monocytes Absolute: 0.5 10*3/uL (ref 0.1–1.0)
Monocytes Relative: 8 %
Neutro Abs: 4.1 10*3/uL (ref 1.7–7.7)
Neutrophils Relative %: 63 %
Platelet Count: 248 10*3/uL (ref 150–400)
RBC: 3.23 MIL/uL — ABNORMAL LOW (ref 3.87–5.11)
RDW: 13.9 % (ref 11.5–15.5)
WBC Count: 6.5 10*3/uL (ref 4.0–10.5)
nRBC: 0 % (ref 0.0–0.2)

## 2024-05-23 LAB — CMP (CANCER CENTER ONLY)
ALT: 12 U/L (ref 0–44)
AST: 16 U/L (ref 15–41)
Albumin: 4 g/dL (ref 3.5–5.0)
Alkaline Phosphatase: 83 U/L (ref 38–126)
Anion gap: 14 (ref 5–15)
BUN: 18 mg/dL (ref 8–23)
CO2: 20 mmol/L — ABNORMAL LOW (ref 22–32)
Calcium: 8.8 mg/dL — ABNORMAL LOW (ref 8.9–10.3)
Chloride: 105 mmol/L (ref 98–111)
Creatinine: 1.19 mg/dL — ABNORMAL HIGH (ref 0.44–1.00)
GFR, Estimated: 47 mL/min — ABNORMAL LOW
Glucose, Bld: 93 mg/dL (ref 70–99)
Potassium: 3.8 mmol/L (ref 3.5–5.1)
Sodium: 139 mmol/L (ref 135–145)
Total Bilirubin: 0.2 mg/dL (ref 0.0–1.2)
Total Protein: 6.1 g/dL — ABNORMAL LOW (ref 6.5–8.1)

## 2024-05-23 LAB — RETIC PANEL
Immature Retic Fract: 28 % — ABNORMAL HIGH (ref 2.3–15.9)
RBC.: 3.17 MIL/uL — ABNORMAL LOW (ref 3.87–5.11)
Retic Count, Absolute: 110.6 10*3/uL (ref 19.0–186.0)
Retic Ct Pct: 3.5 % — ABNORMAL HIGH (ref 0.4–3.1)
Reticulocyte Hemoglobin: 36 pg

## 2024-05-23 LAB — IRON AND IRON BINDING CAPACITY (CC-WL,HP ONLY)
Iron: 86 ug/dL (ref 28–170)
Saturation Ratios: 25 % (ref 10.4–31.8)
TIBC: 342 ug/dL (ref 250–450)
UIBC: 256 ug/dL

## 2024-05-23 LAB — FERRITIN: Ferritin: 56 ng/mL (ref 11–307)

## 2024-05-23 LAB — LACTATE DEHYDROGENASE: LDH: 217 U/L (ref 105–235)

## 2024-05-23 NOTE — Progress Notes (Signed)
 " Hematology and Oncology Follow Up Visit  Tiffany Leon [id number] [birth year] 74 y.o. 05/23/2024   Principle Diagnosis:  Waldenstrom's macroglobulinemia Anemia, chronic renal insufficiency, probable rheumatoid arthritis   Current Therapy:        Rituxan /Cytoxan -- start cycle #1 on 11/10/2018 -- cytoxan  d/c'ed on 12/01/2018 Rituxan  375mg /m2 IV q 2 months -- maintanence - completed on 10/2020 Retacrit  40,000 units sq for Hgb < 11   Interim History:  Tiffany Leon is here today for follow-up. We currently see her every 6 months or so.   Today she states that she has been doing ok. Unfortunately about 1 month ago she developed fairly significant right sided shoulder pain and some SOB. She had a D-Dimer drawn by her PCP which was elevated. She was then sent to the ER and found to have a single subsegmental PE without cor pulmonale. She was started on Eliquis  however she developed severe itching. She was then transitioned to Xarelto 21 days ago. She has been tolerating this well.   SOB and chest pain has resolved. Her only concern is that 2 days ago she started noticing some mild pink tint in her urine and some brown coloring when she wipes after urination. She does not think that this is vaginal in nature. She does have a history of recent UTI for which she took Cipro . She has plans to go to her PCP today to be worked up for her symptoms. She denies any other bleeding. No fevers or vomiting.   Currently her PCP is managing her Xarelto and pulmonary embolism.   She has had no problem with infections.  She is still smoking.  She smokes about half pack a day.  She denies new bone pain, unintentional weight loss or any night sweats.   Appetite and weight are stable Wt Readings from Last 3 Encounters:  05/23/24 139 lb 12.8 oz (63.4 kg)  05/07/24 142 lb (64.4 kg)  04/23/24 152 lb 1.9 oz (69 kg)   Overall, I would have to say that her performance status about ECOG 1.    Medications:  Allergies as  of 05/23/2024       Reactions   Codeine Nausea And Vomiting        Medication List        Accurate as of May 23, 2024 10:34 AM. If you have any questions, ask your nurse or doctor.          Acetaminophen  Extra Strength 500 MG Caps Take 1,000 mg by mouth every 6 (six) hours as needed for pain.   albuterol  108 (90 Base) MCG/ACT inhaler Commonly known as: VENTOLIN  HFA Inhale 1-2 puffs into the lungs every 4 (four) hours as needed.   Apixaban  Starter Pack (10mg  and 5mg ) Commonly known as: ELIQUIS  STARTER PACK Take as directed on package: start with two-5mg  tablets twice daily for 7 days. On day 8, switch to one-5mg  tablet twice daily.   carvedilol  12.5 MG tablet Commonly known as: COREG  TAKE 1 TABLET (12.5MG  TOTAL) BY MOUTH TWICE A DAY WITH MEALS   clonazePAM  0.5 MG tablet Commonly known as: KLONOPIN  Take 1 tablet (0.5 mg total) by mouth 2 (two) times daily.   DULoxetine  60 MG capsule Commonly known as: CYMBALTA  Take 2 capsules (120 mg total) by mouth daily.   hydrOXYzine  10 MG tablet Commonly known as: ATARAX  Take 1-3 tablets (10-30 mg total) by mouth 3 (three) times daily as needed for itching.   lidocaine  5 % Commonly known as: Lidoderm  Place 1  patch onto the skin daily. Remove & Discard patch within 12 hours or as directed by MD   lidocaine -prilocaine  cream Commonly known as: EMLA  Apply 1 application topically as needed. Apply to [facility] 1 hour before access   omeprazole 10 MG capsule Commonly known as: PRILOSEC Take 10 mg by mouth daily.   riTUXimab  100 MG/10ML injection Commonly known as: RITUXAN  Inject into the vein.   traZODone  150 MG tablet Commonly known as: DESYREL  Take 1 tablet (150 mg total) by mouth at bedtime.   triamcinolone ointment 0.1 % Commonly known as: KENALOG Apply 1 application topically daily as needed (eczema).        Allergies:  Allergies  Allergen Reactions   Codeine Nausea And Vomiting    Past Medical History,  Surgical history, Social history, and Family History were reviewed and updated.  Review of Systems: Review of Systems  Constitutional: Negative.   HENT: Negative.    Eyes: Negative.   Respiratory: Negative.    Cardiovascular: Negative.   Gastrointestinal: Negative.  Negative for melena.  Genitourinary: Negative.   Musculoskeletal: Negative.   Skin: Negative.   Neurological: Negative.   Endo/Heme/Allergies: Negative.   Psychiatric/Behavioral: Negative.       Physical Exam:  height is 5' 5.5 (1.664 m) and weight is 139 lb 12.8 oz (63.4 kg). Her oral temperature is 97.9 F (36.6 C). Her blood pressure is 131/84 and her pulse is 102 (abnormal). Her respiration is 18.   Wt Readings from Last 3 Encounters:  05/23/24 139 lb 12.8 oz (63.4 kg)  05/07/24 142 lb (64.4 kg)  04/23/24 152 lb 1.9 oz (69 kg)    Physical Exam Vitals reviewed.  HENT:     Head: Normocephalic and atraumatic.  Eyes:     Pupils: Pupils are equal, round, and reactive to light.  Cardiovascular:     Rate and Rhythm: Normal rate and regular rhythm.     Heart sounds: Normal heart sounds.  Pulmonary:     Effort: Pulmonary effort is normal.     Breath sounds: Normal breath sounds.  Abdominal:     General: Bowel sounds are normal.     Palpations: Abdomen is soft.  Musculoskeletal:        General: No tenderness or deformity. Normal range of motion.     Cervical back: Normal range of motion.  Lymphadenopathy:     Cervical: No cervical adenopathy.  Skin:    General: Skin is warm and dry.     Findings: No erythema or rash.  Neurological:     Mental Status: She is alert and oriented to person, place, and time.  Psychiatric:        Behavior: Behavior normal.        Thought Content: Thought content normal.        Judgment: Judgment normal.      Lab Results  Component Value Date   WBC 6.5 05/23/2024   HGB 10.5 (L) 05/23/2024   HCT 32.8 (L) 05/23/2024   MCV 101.5 (H) 05/23/2024   PLT 248 05/23/2024    Lab Results  Component Value Date   FERRITIN 68 06/01/2023   IRON 134 06/01/2023   TIBC 381 06/01/2023   UIBC 247 06/01/2023   IRONPCTSAT 35 (H) 06/01/2023   Lab Results  Component Value Date   RETICCTPCT 3.5 (H) 05/23/2024   RBC 3.17 (L) 05/23/2024   Lab Results  Component Value Date   KPAFRELGTCHN 12.1 11/22/2023   LAMBDASER 12.1 11/22/2023   KAPLAMBRATIO 1.00 11/22/2023  Lab Results  Component Value Date   IGGSERUM 282 (L) 11/22/2023   IGA 64 11/22/2023   IGMSERUM 71 11/22/2023   Lab Results  Component Value Date   TOTALPROTELP 5.6 (L) 11/22/2023   ALBUMINELP 3.7 11/22/2023   A1GS 0.2 11/22/2023   A2GS 0.8 11/22/2023   BETS 0.7 11/22/2023   GAMS 0.2 (L) 11/22/2023   MSPIKE Not Observed 11/22/2023   SPEI Comment 11/22/2023     Chemistry      Component Value Date/Time   NA 137 04/23/2024 2021   K 3.9 04/23/2024 2021   CL 104 04/23/2024 2021   CO2 17 (L) 04/23/2024 2021   BUN 19 04/23/2024 2021   CREATININE 0.90 04/23/2024 2021   CREATININE 1.04 (H) 11/22/2023 0900      Component Value Date/Time   CALCIUM  9.1 04/23/2024 2021   ALKPHOS 70 11/22/2023 0900   AST 18 11/22/2023 0900   ALT 11 11/22/2023 0900   BILITOT 0.2 11/22/2023 0900      Encounter Diagnoses  Name Primary?   Iron deficiency anemia, unspecified iron deficiency anemia type Yes   Waldenstrom's macroglobulinemia (HCC)    Anemia of chronic renal failure, stage 3a (HCC)    Port-A-Cath in place     Impression and Plan: Tiffany Leon is a very pleasant 74 yo caucasian female with Waldenstrm's macroglobulinemia. She responded well to Rituxan . Initially she was on Rituxan  with Cytoxan  but she could not tolerate the Cytoxan  with her blood counts going down. She does still have a port-a-cath.   She will see her PCP today for her suspected UTI.   Her CBC does show some decline in her Hgb from the hematuria. Hematuria is described as very mild. The good news is today is the first day that she  will be starting the once daily dose instead of twice daily dose. I suspect that this will help reduce her bleeding though further adjustment may be needed. Given her Hgb decline I have suggested a lab recheck in 2 weeks. She is agreeable. We will also screen for iron, folate and B12 given MCV and bleeding history.    RTC 2 weeks port lab RTC every 8 weeks port flush x 6 months RTC 6 months MD, port labs    Lauraine HERO [place], [place] 2/4/202610:34 AM  "

## 2024-05-24 ENCOUNTER — Inpatient Hospital Stay

## 2024-05-24 ENCOUNTER — Ambulatory Visit: Admitting: Medical Oncology

## 2024-05-24 LAB — KAPPA/LAMBDA LIGHT CHAINS
Kappa free light chain: 11.4 mg/L (ref 3.3–19.4)
Kappa, lambda light chain ratio: 0.97 (ref 0.26–1.65)
Lambda free light chains: 11.7 mg/L (ref 5.7–26.3)

## 2024-05-25 LAB — MULTIPLE MYELOMA PANEL, SERUM
Albumin SerPl Elph-Mcnc: 3.3 g/dL (ref 2.9–4.4)
Albumin/Glob SerPl: 1.6 (ref 0.7–1.7)
Alpha 1: 0.3 g/dL (ref 0.0–0.4)
Alpha2 Glob SerPl Elph-Mcnc: 0.9 g/dL (ref 0.4–1.0)
B-Globulin SerPl Elph-Mcnc: 0.8 g/dL (ref 0.7–1.3)
Gamma Glob SerPl Elph-Mcnc: 0.2 g/dL — ABNORMAL LOW (ref 0.4–1.8)
Globulin, Total: 2.1 g/dL — ABNORMAL LOW (ref 2.2–3.9)
IgA: 65 mg/dL (ref 64–422)
IgG (Immunoglobin G), Serum: 243 mg/dL — ABNORMAL LOW (ref 586–1602)
IgM (Immunoglobulin M), Srm: 51 mg/dL (ref 26–217)
Total Protein ELP: 5.4 g/dL — ABNORMAL LOW (ref 6.0–8.5)

## 2024-05-30 ENCOUNTER — Ambulatory Visit (HOSPITAL_COMMUNITY): Admission: RE | Admit: 2024-05-30 | Source: Ambulatory Visit

## 2024-06-06 ENCOUNTER — Inpatient Hospital Stay

## 2024-08-01 ENCOUNTER — Inpatient Hospital Stay

## 2024-08-08 ENCOUNTER — Ambulatory Visit: Admitting: Internal Medicine

## 2024-08-20 ENCOUNTER — Other Ambulatory Visit

## 2024-09-03 ENCOUNTER — Ambulatory Visit: Admitting: Urology

## 2024-09-26 ENCOUNTER — Inpatient Hospital Stay

## 2024-11-20 ENCOUNTER — Ambulatory Visit: Admitting: Psychiatry

## 2024-11-21 ENCOUNTER — Inpatient Hospital Stay

## 2024-11-21 ENCOUNTER — Inpatient Hospital Stay: Attending: Hematology & Oncology | Admitting: Hematology & Oncology
